# Patient Record
Sex: Male | Born: 1955 | Race: White | Hispanic: No | Smoking: Never smoker
Health system: Southern US, Community
[De-identification: ages and names within clinical notes are randomized; demographics above are authoritative.]

## PROBLEM LIST (undated history)

## (undated) DIAGNOSIS — IMO0002 Reserved for concepts with insufficient information to code with codable children: Secondary | ICD-10-CM

## (undated) DIAGNOSIS — G4730 Sleep apnea, unspecified: Secondary | ICD-10-CM

## (undated) DIAGNOSIS — Z9289 Personal history of other medical treatment: Secondary | ICD-10-CM

## (undated) DIAGNOSIS — E7800 Pure hypercholesterolemia, unspecified: Secondary | ICD-10-CM

## (undated) DIAGNOSIS — I491 Atrial premature depolarization: Secondary | ICD-10-CM

## (undated) DIAGNOSIS — I2510 Atherosclerotic heart disease of native coronary artery without angina pectoris: Secondary | ICD-10-CM

## (undated) DIAGNOSIS — I4819 Other persistent atrial fibrillation: Secondary | ICD-10-CM

## (undated) DIAGNOSIS — Z87442 Personal history of urinary calculi: Secondary | ICD-10-CM

## (undated) DIAGNOSIS — D333 Benign neoplasm of cranial nerves: Secondary | ICD-10-CM

## (undated) DIAGNOSIS — M1990 Unspecified osteoarthritis, unspecified site: Secondary | ICD-10-CM

## (undated) DIAGNOSIS — I10 Essential (primary) hypertension: Secondary | ICD-10-CM

## (undated) DIAGNOSIS — G9600 Cerebrospinal fluid leak, unspecified: Secondary | ICD-10-CM

## (undated) HISTORY — PX: EYE SURGERY: SHX253

## (undated) HISTORY — DX: Unspecified osteoarthritis, unspecified site: M19.90

## (undated) HISTORY — DX: Atrial premature depolarization: I49.1

## (undated) HISTORY — PX: OTHER SURGICAL HISTORY: SHX169

---

## 1999-08-06 ENCOUNTER — Encounter: Payer: Self-pay | Admitting: Specialist

## 1999-08-06 ENCOUNTER — Ambulatory Visit (HOSPITAL_COMMUNITY): Admission: RE | Admit: 1999-08-06 | Discharge: 1999-08-06 | Payer: Self-pay | Admitting: Specialist

## 1999-08-20 ENCOUNTER — Ambulatory Visit (HOSPITAL_COMMUNITY): Admission: RE | Admit: 1999-08-20 | Discharge: 1999-08-20 | Payer: Self-pay | Admitting: Specialist

## 1999-08-20 ENCOUNTER — Encounter: Payer: Self-pay | Admitting: Specialist

## 1999-09-03 ENCOUNTER — Ambulatory Visit (HOSPITAL_COMMUNITY): Admission: RE | Admit: 1999-09-03 | Discharge: 1999-09-03 | Payer: Self-pay | Admitting: Specialist

## 1999-09-03 ENCOUNTER — Encounter: Payer: Self-pay | Admitting: Specialist

## 2007-08-07 ENCOUNTER — Emergency Department (HOSPITAL_COMMUNITY): Admission: EM | Admit: 2007-08-07 | Discharge: 2007-08-07 | Payer: Self-pay | Admitting: Emergency Medicine

## 2007-08-28 ENCOUNTER — Ambulatory Visit (HOSPITAL_BASED_OUTPATIENT_CLINIC_OR_DEPARTMENT_OTHER): Admission: RE | Admit: 2007-08-28 | Discharge: 2007-08-28 | Payer: Self-pay | Admitting: Cardiology

## 2007-08-28 ENCOUNTER — Encounter: Payer: Self-pay | Admitting: Internal Medicine

## 2007-09-04 ENCOUNTER — Ambulatory Visit: Payer: Self-pay | Admitting: Internal Medicine

## 2007-09-15 ENCOUNTER — Ambulatory Visit: Admission: RE | Admit: 2007-09-15 | Discharge: 2007-09-15 | Payer: Self-pay | Admitting: Cardiology

## 2007-09-24 ENCOUNTER — Ambulatory Visit (HOSPITAL_COMMUNITY): Admission: RE | Admit: 2007-09-24 | Discharge: 2007-09-24 | Payer: Self-pay | Admitting: Cardiology

## 2007-09-28 ENCOUNTER — Ambulatory Visit: Admission: RE | Admit: 2007-09-28 | Discharge: 2007-09-28 | Payer: Self-pay | Admitting: Cardiology

## 2007-09-28 HISTORY — PX: NM MYOCAR PERF WALL MOTION: HXRAD629

## 2007-09-30 ENCOUNTER — Encounter: Payer: Self-pay | Admitting: Internal Medicine

## 2007-10-11 ENCOUNTER — Ambulatory Visit: Payer: Self-pay | Admitting: Internal Medicine

## 2007-10-11 DIAGNOSIS — G4733 Obstructive sleep apnea (adult) (pediatric): Secondary | ICD-10-CM | POA: Insufficient documentation

## 2007-10-18 DIAGNOSIS — I10 Essential (primary) hypertension: Secondary | ICD-10-CM | POA: Insufficient documentation

## 2008-05-08 ENCOUNTER — Emergency Department (HOSPITAL_COMMUNITY): Admission: EM | Admit: 2008-05-08 | Discharge: 2008-05-08 | Payer: Self-pay | Admitting: Emergency Medicine

## 2008-07-28 HISTORY — PX: KNEE ARTHROSCOPY: SUR90

## 2008-10-18 ENCOUNTER — Emergency Department (HOSPITAL_COMMUNITY): Admission: EM | Admit: 2008-10-18 | Discharge: 2008-10-18 | Payer: Self-pay | Admitting: Emergency Medicine

## 2008-12-19 ENCOUNTER — Encounter: Admission: RE | Admit: 2008-12-19 | Discharge: 2008-12-19 | Payer: Self-pay | Admitting: Orthopedic Surgery

## 2009-02-14 ENCOUNTER — Encounter: Payer: Self-pay | Admitting: Internal Medicine

## 2009-02-15 ENCOUNTER — Encounter: Admission: RE | Admit: 2009-02-15 | Discharge: 2009-02-15 | Payer: Self-pay | Admitting: Cardiovascular Disease

## 2009-02-20 ENCOUNTER — Ambulatory Visit (HOSPITAL_COMMUNITY): Admission: RE | Admit: 2009-02-20 | Discharge: 2009-02-20 | Payer: Self-pay | Admitting: Cardiovascular Disease

## 2009-02-20 HISTORY — PX: CARDIAC CATHETERIZATION: SHX172

## 2009-02-23 ENCOUNTER — Ambulatory Visit: Payer: Self-pay | Admitting: Internal Medicine

## 2009-02-23 DIAGNOSIS — E785 Hyperlipidemia, unspecified: Secondary | ICD-10-CM | POA: Insufficient documentation

## 2009-02-23 DIAGNOSIS — I519 Heart disease, unspecified: Secondary | ICD-10-CM | POA: Insufficient documentation

## 2010-10-23 ENCOUNTER — Other Ambulatory Visit: Payer: Self-pay | Admitting: Orthopedic Surgery

## 2010-10-23 ENCOUNTER — Ambulatory Visit (HOSPITAL_COMMUNITY)
Admission: RE | Admit: 2010-10-23 | Discharge: 2010-10-23 | Disposition: A | Discharge status: Home / Self Care | Payer: Medicare Other | Source: Ambulatory Visit | Attending: Orthopedic Surgery | Admitting: Orthopedic Surgery

## 2010-10-23 ENCOUNTER — Encounter (HOSPITAL_COMMUNITY): Payer: Medicare Other

## 2010-10-23 ENCOUNTER — Other Ambulatory Visit (HOSPITAL_COMMUNITY): Payer: Self-pay | Admitting: Orthopedic Surgery

## 2010-10-23 DIAGNOSIS — Z01811 Encounter for preprocedural respiratory examination: Secondary | ICD-10-CM

## 2010-10-23 DIAGNOSIS — M779 Enthesopathy, unspecified: Secondary | ICD-10-CM

## 2010-10-23 DIAGNOSIS — Z01812 Encounter for preprocedural laboratory examination: Secondary | ICD-10-CM | POA: Insufficient documentation

## 2010-10-23 LAB — PROTIME-INR: INR: 0.98 (ref 0.00–1.49)

## 2010-10-23 LAB — CBC
Platelets: 232 10*3/uL (ref 150–400)
RBC: 5.35 MIL/uL (ref 4.22–5.81)
WBC: 8.6 10*3/uL (ref 4.0–10.5)

## 2010-10-23 LAB — URINALYSIS, ROUTINE W REFLEX MICROSCOPIC
Nitrite: NEGATIVE
Specific Gravity, Urine: 1.027 (ref 1.005–1.030)
Urobilinogen, UA: 1 mg/dL (ref 0.0–1.0)
pH: 6 (ref 5.0–8.0)

## 2010-10-23 LAB — COMPREHENSIVE METABOLIC PANEL
ALT: 31 U/L (ref 0–53)
Albumin: 3.8 g/dL (ref 3.5–5.2)
Alkaline Phosphatase: 55 U/L (ref 39–117)
Calcium: 9.2 mg/dL (ref 8.4–10.5)
Potassium: 4.1 mEq/L (ref 3.5–5.1)
Sodium: 141 mEq/L (ref 135–145)
Total Protein: 7.2 g/dL (ref 6.0–8.3)

## 2010-10-23 LAB — DIFFERENTIAL
Basophils Absolute: 0 10*3/uL (ref 0.0–0.1)
Basophils Relative: 0 % (ref 0–1)
Eosinophils Absolute: 0.1 10*3/uL (ref 0.0–0.7)
Lymphs Abs: 2.4 10*3/uL (ref 0.7–4.0)
Neutrophils Relative %: 62 % (ref 43–77)

## 2010-10-23 LAB — SURGICAL PCR SCREEN
MRSA, PCR: NEGATIVE
Staphylococcus aureus: NEGATIVE

## 2010-10-27 HISTORY — PX: SHOULDER ARTHROSCOPY: SHX128

## 2010-10-30 ENCOUNTER — Ambulatory Visit (HOSPITAL_COMMUNITY): Payer: Medicare Other

## 2010-10-30 ENCOUNTER — Observation Stay (HOSPITAL_COMMUNITY)
Admission: RE | Admit: 2010-10-30 | Discharge: 2010-11-02 | Disposition: A | Discharge status: Home / Self Care | Payer: Medicare Other | Source: Ambulatory Visit | Attending: Orthopedic Surgery | Admitting: Orthopedic Surgery

## 2010-10-30 DIAGNOSIS — Z9119 Patient's noncompliance with other medical treatment and regimen: Secondary | ICD-10-CM | POA: Insufficient documentation

## 2010-10-30 DIAGNOSIS — M19019 Primary osteoarthritis, unspecified shoulder: Principal | ICD-10-CM | POA: Insufficient documentation

## 2010-10-30 DIAGNOSIS — I10 Essential (primary) hypertension: Secondary | ICD-10-CM | POA: Insufficient documentation

## 2010-10-30 DIAGNOSIS — M25819 Other specified joint disorders, unspecified shoulder: Secondary | ICD-10-CM | POA: Insufficient documentation

## 2010-10-30 DIAGNOSIS — R0789 Other chest pain: Secondary | ICD-10-CM | POA: Insufficient documentation

## 2010-10-30 DIAGNOSIS — Z01812 Encounter for preprocedural laboratory examination: Secondary | ICD-10-CM | POA: Insufficient documentation

## 2010-10-30 DIAGNOSIS — E785 Hyperlipidemia, unspecified: Secondary | ICD-10-CM | POA: Insufficient documentation

## 2010-10-30 DIAGNOSIS — M25519 Pain in unspecified shoulder: Secondary | ICD-10-CM | POA: Insufficient documentation

## 2010-10-30 DIAGNOSIS — G4733 Obstructive sleep apnea (adult) (pediatric): Secondary | ICD-10-CM | POA: Insufficient documentation

## 2010-10-30 DIAGNOSIS — Z01811 Encounter for preprocedural respiratory examination: Secondary | ICD-10-CM | POA: Insufficient documentation

## 2010-10-30 DIAGNOSIS — Z91199 Patient's noncompliance with other medical treatment and regimen due to unspecified reason: Secondary | ICD-10-CM | POA: Insufficient documentation

## 2010-10-30 LAB — BASIC METABOLIC PANEL
BUN: 10 mg/dL (ref 6–23)
Calcium: 8.5 mg/dL (ref 8.4–10.5)
Chloride: 104 mEq/L (ref 96–112)
Creatinine, Ser: 0.94 mg/dL (ref 0.4–1.5)
GFR calc Af Amer: >60 mL/min (ref >60)
GFR calc non Af Amer: >60 mL/min (ref >60)

## 2010-10-30 LAB — CARDIAC PANEL(CRET KIN+CKTOT+MB+TROPI)
Total CK: 117 U/L (ref 7–232)
Troponin I: 0.01 ng/mL (ref 0.00–0.06)

## 2010-10-30 LAB — CBC
HCT: 46.4 % (ref 39.0–52.0)
MCHC: 34.3 g/dL (ref 30.0–36.0)
RDW: 14.4 % (ref 11.5–15.5)
WBC: 13.7 10*3/uL — ABNORMAL HIGH (ref 4.0–10.5)

## 2010-10-30 LAB — PROTIME-INR
INR: 0.99 (ref 0.00–1.49)
Prothrombin Time: 13.3 seconds (ref 11.6–15.2)

## 2010-10-31 LAB — CARDIAC PANEL(CRET KIN+CKTOT+MB+TROPI)
Relative Index: 1.8 (ref 0.0–2.5)
Troponin I: 0.01 ng/mL (ref 0.00–0.06)

## 2010-11-08 NOTE — Op Note (Signed)
  Brandon Marks, Brandon Marks               ACCOUNT NO.:  [id number]  MEDICAL RECORD NO.:  [id number]           PATIENT TYPE:  O  LOCATION:  1612                         FACILITY:  [facility]  PHYSICIAN:  Georges Lynch. Fallan Mccarey, M.D.DATE OF BIRTH:  03/01/1956  DATE OF PROCEDURE:  10/30/2010 DATE OF DISCHARGE:                              OPERATIVE REPORT   SURGEON:  Georges Lynch. Darrelyn Hillock, M.D.  ASSISTANT:  Rozell Searing, [facility].  PREOPERATIVE DIAGNOSES: 1. 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 2. Rule out rotator cuff tendon tear, right shoulder. 3. Severe impingement, right shoulder.  POSTOPERATIVE DIAGNOSES: 1. Severe impingement syndrome, right shoulder. 2. Severe degenerative arthritic changes, acromioclavicular joint,     right shoulder.  PROCEDURE:  Under general anesthesia, the patient was placed on the Sline table.  He was well padded because of his body weight, he weighed 380 pounds.  He was in the semi-sitting position.  He had 2 g of IV Ancef preop.  After sterile prep and drape was carried out, we went through the appropriate time-out.  [name] right arm was marked appropriately in the holding area prior to bringing him back to surgery. An incision was made over the anterior aspect of the right shoulder. Bleeders were identified and cauterized.  Note, this man was extremely large, we had an extremely large deep wound.  We first went down and inserted self-retaining retractors.  The incision was carried down to the acromion.  We first identified acromion, dissected the deltoid tendon from the acromion in the usual fashion.  We split the proximal part of the deltoid muscle.  I then advanced the cerebellar retractors since the wound was so deep and identified the [facility] joint, debrided the soft tissue from the joint.  I then utilized a bur to do a resection of the distal clavicle.  Note, he had an extremely large spur protruding from the  acromion and the acromioclavicular joint.  This was actually embedded down over the cuff.  We gently protected the cuff with a Bennett retractor, did a partial acromionectomy and as I said resected distal clavicle with a bur.  Following that, we removed the subdeltoid bursa.  I inspected the cuff.  Surprisingly, the cuff was intact.  No repair of the cuff was necessary.  Thoroughly irrigated out the area.  I bone waxed the distal end of the clavicle as well as the acromion.  I then irrigated the area and then reapproximated the wound in usual fashion with multiple layers because of his body size.  I then closed the skin with metal staples.  Sterile Neosporin dressing was applied. He was placed in an extra large sling.  The patient left the operating room in satisfactory condition.          ______________________________ Georges Lynch Darrelyn Hillock, M.D.     RAG/MEDQ  D:  10/30/2010  T:  10/31/2010  Job:  [id number]  Electronically Signed by Ranee Gosselin M.D. on 11/08/2010 07:54:03 AM

## 2010-11-30 NOTE — Discharge Summary (Addendum)
  [name], Brandon Marks               ACCOUNT NO.:  [id number]  MEDICAL RECORD NO.:  [id number]           PATIENT TYPE:  O  LOCATION:  1612                         FACILITY:  [facility]  PHYSICIAN:  Georges Lynch. Kaytlen Lightsey, M.D.DATE OF BIRTH:  Nov 07, 1955  DATE OF ADMISSION:  10/30/2010 DATE OF DISCHARGE:  11/02/2010                              DISCHARGE SUMMARY   ADMITTING DIAGNOSES: 1. 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 2. Hypertension. 3. Hypercholesterolemia.  DISCHARGE DIAGNOSES: 1. Severe degenerative arthritis of the right shoulder with     impingement, status post acromionectomy. 2. Hypertension. 3. Hypercholesterolemia.  PROCEDURE:  Mr. Peixoto was admitted to [facility] on October 30, 2010.  He underwent acromionectomy of the right shoulder.  No tear was found.  Also underwent closed manipulation prior to incisions.  Theprocedure was performed under general anesthesia.  Routine orthopedic prep and drape were carried out.  The patient received 2 g of IV Ancef preoperatively.  No complications with the procedure.  The patient was returned to the recovery room in satisfactory condition.  HOSPITAL COURSE:  On October 30, 2010, Mr. Wuertz was admitted to the hospital.  He was admitted at [facility].  He underwent the above stated procedure without complications.  After spending adequte time in the recovery room, he was then taken to the floor.  For further recovery, he was placed on reduced dose of PCA, Dilaudid and muscle relaxants for pain control.  He was also placed in a sling, which he will remain in.  After surgery, the patient started to experience some indigestion associated with GI cocktail.  Postoperative day 2, the patient continued to have quite a bit of pain.  His Dilaudid PCA was discontinued.  The patient was discharged home on November 02, 2010.  DISPOSITION:  To home on November 02, 2010.  DISCHARGE MEDICATIONS: 1. Oxycodone. 2. Aspirin. 3. Fish oil. 4. Multivitamin. 5. __________ 6. Atenolol. 7. Robaxin.  DISCHARGE INSTRUCTIONS:  The patient will increase his activities slowly.  No restrictions to his diet.  He will change his dressing daily and keep the wound clean and dry.  FOLLOWUP:  He is to follow up with Dr. Darrelyn Hillock 2 weeks from the day of surgery.  Contact the office at [phone] to schedule this appointment.  CONDITION ON DISCHARGE:  Improving.     Rozell Searing, PAC   ______________________________ Georges Lynch Darrelyn Hillock, M.D.    LD/MEDQ  D:  11/29/2010  T:  11/29/2010  Job:  [id number]  Electronically Signed by Rozell Searing  on 11/30/2010 07:59:43 AM Electronically Signed by Ranee Gosselin M.D. on 12/06/2010 06:46:58 AM

## 2010-12-10 NOTE — Cardiovascular Report (Signed)
[name], Brandon Marks NO.:  [id number]   MEDICAL RECORD NO.:  [id number]          PATIENT TYPE:  OIB   LOCATION:  2899                         FACILITY:  [facility]   PHYSICIAN:  Nanetta Batty, M.D.   DATE OF BIRTH:  [birth year]   DATE OF PROCEDURE:  DATE OF DISCHARGE:  02/20/2009                            CARDIAC CATHETERIZATION   Brandon Marks is a 55 year old severely overweight Caucasian male with  history of controlled hypertension, dyslipidemia, morbid obesity with  obstructive sleep apnea and dyspnea on exertion with a positive Myoview  several months ago.  Seen by Dr. Jacinto Halim.  He is anticipating elective  bariatric surgery and is undergoing diagnostic coronary arteriography  today via the right radial approach to define his anatomy, rule out  ischemic etiology, and risk stratify him.   DESCRIPTION OF PROCEDURE:  The patient was brought to the second floor  at [facility] in the postabsorptive state.  He was  premedicated with p.o. Valium.  His right wrist was prepped and shaved  in the usual sterile fashion.  A 1% Xylocaine was used for local  anesthesia.  A 5-French Terumo sheath was inserted into the right radial  artery using micropuncture back wall technique.  The patient received  4000 units of heparin intravenously.  Standard vasospastic cocktail  was administered via the sidearm sheath.  A 5-French Tiger catheter and  pigtail catheters were used for selective coronary angiography and left  ventriculography respectively.  Visipaque dye was used for the entirety  of the case.  Retrograde aortic, left ventricular and pullback pressures  were recorded.   HEMODYNAMICS:  1. Aortic systolic pressure 96, diastolic pressure 62.  2. Left ventricular systolic pressure 101, end-diastolic pressure 8.   SELECTIVE CORONARY ANGIOGRAPHY:  1. Left main normal.  2. LAD normal.  There was a small vessel that arose the proximal LAD      and went  anteriorly.  I question whether this was a coronary      artery/coronary artery fistula, although there was slow flow.  3. Circumflex; free of systemic disease.  4. Right coronary; dominant and free of systemic disease.  5. Left ventriculography; RAO left ventriculogram was performed using      25 mL of Visipaque dye at 12 mL per second.  The overall LVEF was      estimated greater than 60% without focal wall motion abnormalities.   IMPRESSION:  Brandon Marks has normal coronaries and normal left  ventricular function.  I believe his Myoview was false positive given  his body habitus and his dyspnea is multifactorial, most likely related  to obesity and obstructive sleep apnea.  Regardless, he is at low risk  from a cardiovascular point of view for bariatric surgery and general  anesthesia.  The right radial sheath was removed and a TR hemostasis  band was placed to achieve hemostasis.  The patient left the lab in  stable condition.  He will be discharged home later today as an  outpatient and will see me back in the office in approximately 1 week in  followup.  Dr. Aida Puffer was notified of these results.      Nanetta Batty, M.D.  Electronically Signed     JB/MEDQ  D:  02/20/2009  T:  02/20/2009  Job:  [id number]   cc:   [facility]  Coralyn Helling, MD  Aida Puffer

## 2010-12-10 NOTE — Procedures (Signed)
NAME:  Brandon Marks, Brandon Marks               ACCOUNT NO.:  [id number]   MEDICAL RECORD NO.:  [id number]          PATIENT TYPE:  OUT   LOCATION:  SLEEP CENTER                 [facility]ACILITY[facility]  [facility]   PHYSICIAN:  Clinton D. Maple Hudson, MD, FCCP, FACPDATE OF BIRTH:  08[birth year]08/53   DATE OF STUDY:  08/28/2007                            NOCTURNAL POLYSOMNOGRAM   REFERRING PHYSICIAN:  Vonna Kotyk R. Jacinto Halim, MD   INDICATION FOR STUDY:  Hypersomnia with sleep apnea.   EPWORTH SLEEPINESS SCORE:  11/24.  BMI 51.5. Weight 390 pounds.  Height  73 inches.  Neck 20 inches.   MEDICATIONS:  No home medication was charted.   SLEEP ARCHITECTURE:  Split study protocol.  During the diagnostic phase,  total sleep time was 129.5 minutes with sleep efficiency 67.1%.  Stage 1  was 6.2%, stage 2 was 93.8%, stages 3  and REM were absent.  Sleep  latency 15.5 minutes.  Awake after sleep onset 43 minutes.  Arousal  index 1.9.  No bedtime medication was taken.   RESPIRATORY DATA:  Split study protocol.  Apnea/hypopnea index (AHI)  32.9 events per hour.  This included one mixed apnea and 70 hypopnea's  before CPAP.  Most events were recorded while sleeping supine.  CPAP was  titrated to 12 CWP for complete control of respiratory events with AHI  0.  The technician took the pressure up to 24 CWP, AHI 0, to completely  stop snoring.  A large Mirage Quattro full face mask was used with  heated humidifier noting that the patient wears a beard, which will  effect mask seal.   OXYGEN DATA:  Moderately loud snoring with oxygen desaturation to a  nadir of 74% on room air with a mean oxygen saturation during the  diagnostic phase of 88%.  CPAP was placed and titrated because of  significant oxygen desaturation together with clinically significant  sleep apnea score.  With CPAP control, oxygen saturation held at a mean  of 91.7% on room air.   CARDIAC DATA:  Sinus rhythm with PAC's.   MOVEMENT-PARASOMNIA:  No significant movement disturbance.   Bathroom x2.   IMPRESSIONS-RECOMMENDATIONS:  1. Moderately severe obstructive sleep apnea/hypopnea syndrome,      apnea/hypopnea index 32.9 per hour with most events recorded while      sleeping supine.  Moderately loud snoring with oxygen desaturation      to a nadir of 74%.  2. Successful CPAP titration with control of respiratory events and      adequate sleep efficiency at a CPAP pressure of 12 CWP,      apnea/hypopnea index 0.  Note that the technician took the pressure      up to 24 CWP to stop all      snoring.  This would be uncomfortable and would require a bilevel      machine.  Recommend a starting home trial pressure of 12 CWP.      Clinton D. Maple Hudson, MD, [facility], FACP  Diplomate, Biomedical engineer of Sleep Medicine  Electronically Signed     CDY/MEDQ  D:  09/04/2007 16:57:23  T:  09/06/2007 10:51:33  Job:  [id number]   cc:  Cristy Hilts. Jacinto Halim, MD  Fax: [phone]

## 2012-02-27 ENCOUNTER — Emergency Department (HOSPITAL_COMMUNITY)
Admission: EM | Admit: 2012-02-27 | Discharge: 2012-02-27 | Disposition: A | Discharge status: Home / Self Care | Payer: Medicare Other | Attending: Emergency Medicine | Admitting: Emergency Medicine

## 2012-02-27 ENCOUNTER — Encounter (HOSPITAL_COMMUNITY): Payer: Self-pay | Admitting: Emergency Medicine

## 2012-02-27 ENCOUNTER — Emergency Department (HOSPITAL_COMMUNITY): Payer: Medicare Other

## 2012-02-27 DIAGNOSIS — G4733 Obstructive sleep apnea (adult) (pediatric): Secondary | ICD-10-CM | POA: Diagnosis present

## 2012-02-27 DIAGNOSIS — E7800 Pure hypercholesterolemia, unspecified: Secondary | ICD-10-CM | POA: Insufficient documentation

## 2012-02-27 DIAGNOSIS — I10 Essential (primary) hypertension: Secondary | ICD-10-CM | POA: Diagnosis present

## 2012-02-27 DIAGNOSIS — IMO0002 Reserved for concepts with insufficient information to code with codable children: Secondary | ICD-10-CM | POA: Insufficient documentation

## 2012-02-27 DIAGNOSIS — R0600 Dyspnea, unspecified: Secondary | ICD-10-CM | POA: Diagnosis present

## 2012-02-27 DIAGNOSIS — E785 Hyperlipidemia, unspecified: Secondary | ICD-10-CM | POA: Diagnosis present

## 2012-02-27 DIAGNOSIS — R079 Chest pain, unspecified: Secondary | ICD-10-CM | POA: Diagnosis present

## 2012-02-27 DIAGNOSIS — G4730 Sleep apnea, unspecified: Secondary | ICD-10-CM | POA: Insufficient documentation

## 2012-02-27 DIAGNOSIS — Z9989 Dependence on other enabling machines and devices: Secondary | ICD-10-CM | POA: Diagnosis present

## 2012-02-27 HISTORY — DX: Essential (primary) hypertension: I10

## 2012-02-27 HISTORY — DX: Reserved for concepts with insufficient information to code with codable children: IMO0002

## 2012-02-27 HISTORY — DX: Pure hypercholesterolemia, unspecified: E78.00

## 2012-02-27 HISTORY — DX: Sleep apnea, unspecified: G47.30

## 2012-02-27 HISTORY — DX: Morbid (severe) obesity due to excess calories: E66.01

## 2012-02-27 LAB — CBC WITH DIFFERENTIAL/PLATELET
Basophils Absolute: 0 10*3/uL (ref 0.0–0.1)
Eosinophils Relative: 1 % (ref 0–5)
HCT: 43.3 % (ref 39.0–52.0)
Hemoglobin: 15.1 g/dL (ref 13.0–17.0)
Lymphocytes Relative: 20 % (ref 12–46)
Lymphs Abs: 2.2 10*3/uL (ref 0.7–4.0)
MCV: 87.1 fL (ref 78.0–100.0)
Monocytes Absolute: 0.7 10*3/uL (ref 0.1–1.0)
Monocytes Relative: 6 % (ref 3–12)
Neutro Abs: 8.2 10*3/uL — ABNORMAL HIGH (ref 1.7–7.7)
RBC: 4.97 MIL/uL (ref 4.22–5.81)
WBC: 11.2 10*3/uL — ABNORMAL HIGH (ref 4.0–10.5)

## 2012-02-27 LAB — COMPREHENSIVE METABOLIC PANEL
AST: 17 U/L (ref 0–37)
CO2: 26 mEq/L (ref 19–32)
Calcium: 8.9 mg/dL (ref 8.4–10.5)
Chloride: 105 mEq/L (ref 96–112)
Creatinine, Ser: 0.9 mg/dL (ref 0.50–1.35)
GFR calc Af Amer: >90 mL/min (ref >90)
GFR calc non Af Amer: >90 mL/min (ref >90)
Glucose, Bld: 127 mg/dL — ABNORMAL HIGH (ref 70–99)
Total Bilirubin: 0.3 mg/dL (ref 0.3–1.2)

## 2012-02-27 LAB — LIPASE, BLOOD: Lipase: 42 U/L (ref 11–59)

## 2012-02-27 MED ORDER — ASPIRIN 325 MG PO TABS
325.0000 mg | ORAL_TABLET | Freq: Once | ORAL | Status: DC
  Filled 2012-02-27: qty 1

## 2012-02-27 MED ORDER — ASPIRIN 81 MG PO CHEW
162.0000 mg | CHEWABLE_TABLET | Freq: Once | ORAL | Status: AC
  Administered 2012-02-27: 162 mg via ORAL

## 2012-02-27 MED ORDER — ASPIRIN 81 MG PO CHEW
CHEWABLE_TABLET | ORAL | Status: AC
  Filled 2012-02-27: qty 2

## 2012-02-27 MED ORDER — OMEPRAZOLE 20 MG PO CPDR: 20.0000 mg | DELAYED_RELEASE_CAPSULE | Freq: Every day | ORAL | Status: DC

## 2012-02-27 NOTE — ED Notes (Addendum)
Pt reports (L) side chest pain "around my heart" radiating straight through to his back last night at 2300, pt describes the pain as "discomfort." Pt reports the pain woke him up from his sleep at 0100 this am. Pt denies sob, N/V, headache, cough, congestion, or abd pain. Pt reports (R) arm tingling/numbness 2 days ago.

## 2012-02-27 NOTE — ED Provider Notes (Signed)
History     CSN: [id number]  Arrival date & time 02/27/12  4098   First MD Initiated Contact with Patient 02/27/12 [phone]      Chief Complaint  Patient presents with  . Chest Pain    (Consider location/radiation/quality/duration/timing/severity/associated sxs/prior treatment) Patient is a 56 y.o. male presenting with chest pain. The history is provided by the patient.  Chest Pain The chest pain began yesterday. Duration of episode(s) is 5 hours. Chest pain occurs constantly. The chest pain is resolved. The pain is associated with eating. The pain is currently at 0/10. The severity of the pain is moderate. The quality of the pain is described as dull, aching and pressure-like. The pain radiates to the left shoulder and mid back. Pertinent negatives for primary symptoms include no fever, no cough, no abdominal pain, no nausea and no vomiting. Risk factors include obesity and male gender.  His family medical history is significant for CAD in family.  Procedure history is positive for stress echo.   The pain started after eating fried chicken yesterday. The pain woke him up from sleep. Initially, just substernal. Now radiating L shoulder and back. No ab pain, No numbness or weakness in legs.   Past Medical History  Diagnosis Date  . Herniated disc   . Hypertension   . High cholesterol   . Sleep apnea     non compliant with c-pap  . Morbid obesity     Past Surgical History  Procedure Date  . Knee arthroscopy 2010    Lt  . Shoulder arthroscopy 4/12    Rt    Family History  Problem Relation Age of Onset  . Hypertension Mother   . Heart failure Mother   . Heart failure Father     History  Substance Use Topics  . Smoking status: Never Smoker   . Smokeless tobacco: Never Used  . Alcohol Use: No      Review of Systems  Constitutional: Negative for fever.  Respiratory: Negative for cough.   Cardiovascular: Positive for chest pain.  Gastrointestinal: Negative for nausea,  vomiting, abdominal pain and abdominal distention.  All other systems reviewed and are negative.    Allergies  Codeine and Oxycodone  Home Medications   Current Outpatient Rx  Name Route Sig Dispense Refill  . LISINOPRIL-HYDROCHLOROTHIAZIDE 20-12.5 MG PO TABS Oral Take 1 tablet by mouth daily.    Marland Kitchen LOVASTATIN 20 MG PO TABS Oral Take 40 mg by mouth at bedtime.      BP 134/79  Pulse 78  Temp 99 F (37.2 C) (Oral)  Resp 21  SpO2 98%  Physical Exam  Constitutional: He is oriented to person, place, and time. He appears well-developed.       Obese, not in distress  HENT:  Head: Normocephalic.  Eyes: Conjunctivae and EOM are normal. Pupils are equal, round, and reactive to light.  Neck: Normal range of motion. Neck supple.  Cardiovascular: Normal rate, regular rhythm, normal heart sounds and intact distal pulses.   Pulmonary/Chest: Effort normal and breath sounds normal.  Abdominal: Soft. Bowel sounds are normal. He exhibits no mass. There is no tenderness.  Musculoskeletal: Normal range of motion.  Neurological: He is alert and oriented to person, place, and time.  Skin: Skin is warm.  Psychiatric: He has a normal mood and affect. His behavior is normal. Judgment and thought content normal.    ED Course  Procedures (including critical care time)  Labs Reviewed  CBC WITH DIFFERENTIAL - Abnormal;  Notable for the following:    WBC 11.2 (*)     Neutro Abs 8.2 (*)     All other components within normal limits  COMPREHENSIVE METABOLIC PANEL - Abnormal; Notable for the following:    Glucose, Bld 127 (*)     All other components within normal limits  LIPASE, BLOOD  POCT I-STAT TROPONIN I   Dg Chest 2 View  02/27/2012  *RADIOLOGY REPORT*  Clinical Data: Chest pain.  CHEST - 2 VIEW  Comparison: 10/30/2010  Findings: Cardiomegaly.  Lungs are clear.  No effusions or edema. No acute bony abnormality.  Degenerative changes in the thoracic spine.  IMPRESSION: Cardiomegaly.  No active  disease.  Original Report Authenticated By: Cyndie Chime, M.D.     No diagnosis found.  Date: 02/27/2012  Rate: 74  Rhythm: atrial fibrillation  QRS Axis: right  Intervals: normal  ST/T Wave abnormalities: TWI anteriorly   Conduction Disutrbances:none  Narrative Interpretation:   Old EKG Reviewed: none available    MDM  56 yo M hx of HTN, HL here with CP. Substernal CP after eating fried food now radiating to L shoulder and back but pain free currently. Given patient had abnormal nuclear stress test 2009 with nl cath, will need to consider cardiology consult. Will check trop, labs, cxr, ekg, likely need imaging.   11:45 AM Labs nl, pain free. Discussed with Dr. Allyson Sabal from interventional cardiology, who will see the patient.  11:45 AM Dr. Hazle Coca partner saw the patient and made him an outpatient appointment. Patient pain free now, safe for d/c.        Richardean Canal, MD 02/27/12 1145

## 2012-02-27 NOTE — ED Notes (Signed)
Patient transported to X-ray 

## 2012-02-27 NOTE — ED Notes (Signed)
MD at bedside. Cardiologist at bedside.  

## 2012-02-27 NOTE — ED Notes (Signed)
Pt c/o left sided CP starting at 0100 this am with SOB; pt denies N/V or diaphoresis; pt sts radiates through to back

## 2012-02-27 NOTE — H&P (Signed)
Patient ID: Brandon Marks MRN: [id number], DOB/AGE: 1955/11/01   Admit date: 02/27/2012   Primary Physician: Aida Puffer, MD Primary Cardiologist: Dr Royann Shivers   HPI: 56 y/o with a history of morbid obesity, sleep apnea, HTN, and dyslipidemia. He has not used his C-pap in more than a year. He had an abnormal Myoview in 2009 but it was felt to be low risk. He ended up getting cathed in July 2010 for chest pain, this revealed normal coronaries and Nl LVF. LOV was Aug 2012. There was some indication he may go for bariatric surgery but this was never done because of cost. He is not working now. Early this am he woke up with vague chest discomfort "tight". No radiation to his arm, neck, or jaw. No associated SOB, nausea, vomiting, or diaphoresis. He took 2 ASA around 4 am and come to the ER. He is pain free now without Rx.    Problem List: Past Medical History  Diagnosis Date  . Herniated disc   . Hypertension   . High cholesterol   . Sleep apnea     non compliant with c-pap  . Morbid obesity     Past Surgical History  Procedure Date  . Knee arthroscopy 2010    Lt  . Shoulder arthroscopy 4/12    Rt     Allergies:  Allergies  Allergen Reactions  . Codeine   . Oxycodone      Home Medications  (Not in a hospital admission)   Family History  Problem Relation Age of Onset  . Hypertension Mother   . Heart failure Mother   . Heart failure Father      History   Social History  . Marital Status: Divorced    Spouse Name: N/A    Number of Children: N/A  . Years of Education: N/A   Occupational History  . Not on file.   Social History Main Topics  . Smoking status: Never Smoker   . Smokeless tobacco: Never Used  . Alcohol Use: No  . Drug Use: No  . Sexually Active:    Other Topics Concern  . Not on file   Social History Narrative  . No narrative on file     Review of Systems: General: negative for chills, fever, night sweats or weight changes.    Cardiovascular: negative for chest pain, chronic dyspnea on exertion, edema, orthopnea, palpitations, paroxysmal nocturnal dyspnea or shortness of breath Dermatological: negative for rash Respiratory: negative for cough or wheezing Urologic: negative for hematuria Abdominal: negative for nausea, vomiting, diarrhea, bright red blood per rectum, melena, or hematemesis Neurologic: negative for visual changes, syncope, or dizziness All other systems reviewed and are otherwise negative except as noted above.  Physical Exam: Blood pressure 134/79, pulse 78, temperature 99 F (37.2 C), temperature source Oral, resp. rate 21, SpO2 98.00%.  General appearance: alert, cooperative, no distress and morbidly obese Neck: no adenopathy, no carotid bruit, no JVD, supple, symmetrical, trachea midline and thyroid not enlarged, symmetric, no tenderness/mass/nodules Lungs: clear to auscultation bilaterally Heart: regular rate and rhythm Abdomen: obese Extremities: trace edma Pulses: 2+ and symmetric Skin: Skin color, texture, turgor normal. No rashes or lesions Neurologic: Grossly normal    Labs:   Results for orders placed during the hospital encounter of 02/27/12 (from the past 24 hour(s))  CBC WITH DIFFERENTIAL     Status: Abnormal   Collection Time   02/27/12  8:11 AM      Component Value Range   WBC  11.2 (*) 4.0 - 10.5 K/uL   RBC 4.97  4.22 - 5.81 MIL/uL   Hemoglobin 15.1  13.0 - 17.0 g/dL   HCT 40.9  81.1 - 91.4 %   MCV 87.1  78.0 - 100.0 fL   MCH 30.4  26.0 - 34.0 pg   MCHC 34.9  30.0 - 36.0 g/dL   RDW 78.2  95.6 - 21.3 %   Platelets 245  150 - 400 K/uL   Neutrophils Relative 73  43 - 77 %   Neutro Abs 8.2 (*) 1.7 - 7.7 K/uL   Lymphocytes Relative 20  12 - 46 %   Lymphs Abs 2.2  0.7 - 4.0 K/uL   Monocytes Relative 6  3 - 12 %   Monocytes Absolute 0.7  0.1 - 1.0 K/uL   Eosinophils Relative 1  0 - 5 %   Eosinophils Absolute 0.1  0.0 - 0.7 K/uL   Basophils Relative 0  0 - 1 %    Basophils Absolute 0.0  0.0 - 0.1 K/uL  COMPREHENSIVE METABOLIC PANEL     Status: Abnormal   Collection Time   02/27/12  8:11 AM      Component Value Range   Sodium 140  135 - 145 mEq/L   Potassium 3.6  3.5 - 5.1 mEq/L   Chloride 105  96 - 112 mEq/L   CO2 26  19 - 32 mEq/L   Glucose, Bld 127 (*) 70 - 99 mg/dL   BUN 13  6 - 23 mg/dL   Creatinine, Ser 0.86  0.50 - 1.35 mg/dL   Calcium 8.9  8.4 - 57.8 mg/dL   Total Protein 7.0  6.0 - 8.3 g/dL   Albumin 3.6  3.5 - 5.2 g/dL   AST 17  0 - 37 U/L   ALT 21  0 - 53 U/L   Alkaline Phosphatase 50  39 - 117 U/L   Total Bilirubin 0.3  0.3 - 1.2 mg/dL   GFR calc non Af Amer >90  >90 mL/min   GFR calc Af Amer >90  >90 mL/min  LIPASE, BLOOD     Status: Normal   Collection Time   02/27/12  8:11 AM      Component Value Range   Lipase 42  11 - 59 U/L  POCT I-STAT TROPONIN I     Status: Normal   Collection Time   02/27/12  8:29 AM      Component Value Range   Troponin i, poc 0.00  0.00 - 0.08 ng/mL   Comment 3              Radiology/Studies: Dg Chest 2 View  02/27/2012  *RADIOLOGY REPORT*  Clinical Data: Chest pain.  CHEST - 2 VIEW  Comparison: 10/30/2010  Findings: Cardiomegaly.  Lungs are clear.  No effusions or edema. No acute bony abnormality.  Degenerative changes in the thoracic spine.  IMPRESSION: Cardiomegaly.  No active disease.  Original Report Authenticated By: Cyndie Chime, M.D.    EKG:NSR, PACs  ASSESSMENT AND PLAN:  Principal Problem:  *Chest pain Active Problems:  OBSTRUCTIVE SLEEP APNEA  Obesity, morbid  Dyspnea, chronic DOE  HYPERLIPIDEMIA  HYPERTENSION  Plan-MD to see, ? OK to discharge on PPI with plans to resume C-Pap.  Deland Pretty, PA-C 02/27/2012, 11:31 AM  I have seen and examined the patient along with Corine Shelter, PA.  I have reviewed the chart, notes and new data.  I agree with NP's note.  Key  new complaints: chest pain has resolved Key examination changes: as much as obesity permits examination, no  abnormalities are found other than PACs Key new findings / data: ECG and enzymes negative for signs of ischemia  PLAN: Body habitus precludes adequate noninvasive imaging studies. If he needs further evaluation for CAD the best option is direct coronary angiography. At this point invasive evaluation appears unnecessary. DC home. Empirical PPI.  Thurmon Fair, MD, [facility] [phone] 02/27/2012, 11:38 AM

## 2012-02-27 NOTE — ED Notes (Signed)
Pt reports taking asa 162 mg this am prior to arrival

## 2012-07-05 ENCOUNTER — Encounter: Payer: Self-pay | Admitting: *Deleted

## 2012-07-05 ENCOUNTER — Encounter: Payer: Medicare Other | Attending: Family Medicine | Admitting: *Deleted

## 2012-07-05 DIAGNOSIS — Z713 Dietary counseling and surveillance: Secondary | ICD-10-CM | POA: Insufficient documentation

## 2012-07-05 NOTE — Progress Notes (Signed)
Medical Nutrition Therapy:  Appt start time: 0915 end time:  09811.  Assessment:  Patient here today for morbid obesity and hypertension. He reports that he has a history of trying to lose weight with short term success. He has lost about 16 pounds over the last month, but has gained some back this past week. He reports that he tries to eat healthy, but always goes back to bad habits. He admits to drinking too much regular soda and frequent evening snacking. BMI 54.6.   MEDICATIONS: Phentermine, lisinopril, lovastatin, Prilosec   DIETARY INTAKE:   Usual eating pattern includes 2 meals and 1 snacks per day.  24-hr recall:  B ( AM): Eggs, whole wheat toast, sausage/bacon, coffee (Splenda and cream)  Snk ( AM): None  L ( PM): apple/orange Snk ( PM): None D ( PM): Salad with grilled chicken, or Cornbread, grilled chicken, green beans/corn Snk ( PM): Oranges, chocolate candy/cookies sometimes Beverages: Water, coffee, soda, unsweetened tea  Usual physical activity: He has gone to the [place] in the past, walking on treadmill, stationary bike, but this is not a regular habit  Estimated energy needs: 1600 calories 200 g carbohydrates 100 g protein 44 g fat  Progress Towards Goal(s):  In progress.   Nutritional Diagnosis:  [place]-3.3 Overweight/obesity As related to excessive energy intake and physical inactivity.  As evidenced by BMI 54.6.    Intervention:  Nutrition counseling. We discussed nutrition for weight loss, including the importance of eating regular meals, planned/healthy snacks, label reading, and portion control.   Goals:  1. 1-2 pounds weight loss per week.  2. Reduce soda intake. Carry water in a 1 liter bottle to drink throughout the day. Aim for at least 2 liters of water/unsweetened drinks.  3. Plan for a healthy evening snack.  4. Monitor portion size and choose healthier options most frequently.   Handouts given during visit include:  Weight loss tips handout  Yellow  portions card  Monitoring/Evaluation:  Dietary intake, exercise, and body weight in 1 month(s).

## 2012-07-05 NOTE — Patient Instructions (Signed)
Goals:  1. 1-2 pounds weight loss per week.  2. Reduce soda intake. Carry water in a 1 liter bottle to drink throughout the day. Aim for at least 2 liters of water/unsweetened drinks.  3. Plan for a healthy evening snack.  4. Monitor portion size and choose healthier options most frequently.

## 2012-08-02 ENCOUNTER — Ambulatory Visit: Payer: Medicare Other | Admitting: *Deleted

## 2012-12-29 ENCOUNTER — Other Ambulatory Visit: Payer: Self-pay | Admitting: Orthopaedic Surgery

## 2012-12-29 DIAGNOSIS — M542 Cervicalgia: Secondary | ICD-10-CM

## 2012-12-29 DIAGNOSIS — M5417 Radiculopathy, lumbosacral region: Secondary | ICD-10-CM

## 2013-01-13 ENCOUNTER — Ambulatory Visit
Admission: RE | Admit: 2013-01-13 | Discharge: 2013-01-13 | Disposition: A | Discharge status: Home / Self Care | Payer: Medicare Other | Source: Ambulatory Visit | Attending: Orthopaedic Surgery | Admitting: Orthopaedic Surgery

## 2013-01-13 ENCOUNTER — Other Ambulatory Visit: Payer: Medicare Other

## 2013-01-13 DIAGNOSIS — M542 Cervicalgia: Secondary | ICD-10-CM

## 2013-01-13 DIAGNOSIS — M5417 Radiculopathy, lumbosacral region: Secondary | ICD-10-CM

## 2013-01-18 ENCOUNTER — Other Ambulatory Visit: Payer: Self-pay | Admitting: *Deleted

## 2013-01-18 MED ORDER — LISINOPRIL-HYDROCHLOROTHIAZIDE 20-12.5 MG PO TABS: 1.0000 | ORAL_TABLET | Freq: Every day | ORAL | Status: DC

## 2013-01-18 NOTE — Telephone Encounter (Signed)
Refill lisinopril HCTZ electronically

## 2013-05-18 ENCOUNTER — Ambulatory Visit: Payer: Medicare Other | Admitting: Cardiovascular Disease

## 2013-06-20 ENCOUNTER — Other Ambulatory Visit: Payer: Self-pay | Admitting: Cardiovascular Disease

## 2013-06-20 NOTE — Telephone Encounter (Signed)
Rx was sent to pharmacy electronically. 

## 2013-07-13 ENCOUNTER — Other Ambulatory Visit: Payer: Self-pay | Admitting: Cardiovascular Disease

## 2013-07-13 ENCOUNTER — Ambulatory Visit: Payer: Medicare Other | Admitting: Cardiovascular Disease

## 2013-07-13 ENCOUNTER — Other Ambulatory Visit: Payer: Self-pay | Admitting: *Deleted

## 2013-07-13 MED ORDER — LOVASTATIN 20 MG PO TABS: 20.0000 mg | ORAL_TABLET | Freq: Every day | ORAL | Status: DC

## 2013-07-13 MED ORDER — LISINOPRIL-HYDROCHLOROTHIAZIDE 20-12.5 MG PO TABS: 1.0000 | ORAL_TABLET | Freq: Every day | ORAL | Status: DC

## 2013-07-13 NOTE — Telephone Encounter (Signed)
Patient had to cancel appt today and rescheduled for mid January 2015.  Prescriptions refilled for #30 only.

## 2013-08-02 ENCOUNTER — Other Ambulatory Visit: Payer: Self-pay | Admitting: Cardiovascular Disease

## 2013-08-02 NOTE — Telephone Encounter (Signed)
Rx was sent to pharmacy electronically. 

## 2013-08-08 ENCOUNTER — Ambulatory Visit: Payer: Medicare Other | Admitting: Cardiovascular Disease

## 2013-08-09 ENCOUNTER — Telehealth: Payer: Self-pay | Admitting: Cardiovascular Disease

## 2013-08-09 NOTE — Telephone Encounter (Signed)
Running out of meds because unable to make appt on 1/12 because had sinus infection  Has appt 2/4  Please call

## 2013-08-09 NOTE — Telephone Encounter (Signed)
Returned call and pt verified x 2.  Pt informed message received and he will need a sooner appt for refills as it has been > 1 year since last visit and he has been given 3 refills w/ instructions to be seen for more.  Pt verbalized understanding and agreed w/ plan.  Appt scheduled for 1.15.15 at 11:30am w/ Dr. Sallyanne Kuster for evaluation and refills.  Appt on 2.3.15 canceled.  Pt agreed to keep appt as he only has 4 pills left.

## 2013-08-09 NOTE — Telephone Encounter (Signed)
Returned call.  Left message to call back before 4pm.  Pt last seen October 2013 and given 3 refills w/ warnings he needs an appt for refills.  Pt canceled appts on 12.17.14 and 1.12.15 on the day of the appointment.  Pt will need to schedule an appointment w/ an Extender to be seen to get refills until appt w/ Dr. Sallyanne Kuster.  Will offer sooner appt w/ an Extender or pt can see PCP for refills until he can be seen.

## 2013-08-11 ENCOUNTER — Other Ambulatory Visit: Payer: Self-pay | Admitting: Cardiovascular Disease

## 2013-08-11 ENCOUNTER — Ambulatory Visit (INDEPENDENT_AMBULATORY_CARE_PROVIDER_SITE_OTHER): Payer: Medicare Other | Admitting: Cardiovascular Disease

## 2013-08-11 ENCOUNTER — Encounter: Payer: Self-pay | Admitting: Cardiovascular Disease

## 2013-08-11 VITALS — BP 134/80 | HR 81 | Resp 20 | Ht 73.0 in | Wt 390.0 lb

## 2013-08-11 DIAGNOSIS — I10 Essential (primary) hypertension: Secondary | ICD-10-CM

## 2013-08-11 DIAGNOSIS — Z79899 Other long term (current) drug therapy: Secondary | ICD-10-CM

## 2013-08-11 DIAGNOSIS — E785 Hyperlipidemia, unspecified: Secondary | ICD-10-CM

## 2013-08-11 MED ORDER — LISINOPRIL-HYDROCHLOROTHIAZIDE 20-12.5 MG PO TABS: 1.0000 | ORAL_TABLET | Freq: Every day | ORAL | Status: DC

## 2013-08-11 MED ORDER — PHENTERMINE HCL 37.5 MG PO CAPS
37.5000 mg | ORAL_CAPSULE | ORAL | Status: DC
Start: 2013-08-11 — End: 2013-10-20

## 2013-08-11 NOTE — Patient Instructions (Addendum)
Your physician recommends that you schedule a follow-up appointment in: [facility] [place] [facility] WORK TO BE DONE

## 2013-08-12 NOTE — Telephone Encounter (Signed)
Rx was sent to pharmacy electronically. 

## 2013-08-15 ENCOUNTER — Encounter: Payer: Self-pay | Admitting: Cardiovascular Disease

## 2013-08-15 ENCOUNTER — Other Ambulatory Visit: Payer: Self-pay | Admitting: Cardiovascular Disease

## 2013-08-15 NOTE — Assessment & Plan Note (Signed)
I would like to encourage any effort on his part to lose weight and therefore gave him a short-term refill on his phentermine until he is able to followup with his physician. We discussed the fact that these medications need to be taken only as part of a comprehensive weight loss effort and are not a silver bullet. He does not have valvular heart disease or pulmonary hypertension.

## 2013-08-15 NOTE — Assessment & Plan Note (Signed)
Problem is his low HDL cholesterol, but this will not improve until he loses substantial weight.

## 2013-08-15 NOTE — Progress Notes (Signed)
Patient ID: Brandon Marks, male   DOB: 04/18/1956, 58 y.o.   MRN: [id number]     Reason for office visit Brandon Marks is a morbidly obese 58 year old man with hypertension, hyperlipidemia, obstructive sleep apnea and diastolic left ventricular dysfunction who is long overdue for followup. The only reason he came in is because he needed prescriptions.  He also wants to restart treatment with phentermine which helped him lose about 15 pounds. When he ran out of this prescription, provided by his primary care physician, he started gaining the weight back. He has a voracious appetite and very poor impulse control. He eats high sodium high caloric foods with little exertional value such as chips and french fries.  He underwent cardiac catheterization in 2010 which did not show any meaningful coronary artery disease. The study was performed for a false positive nuclear stress test when he was anticipating bariatric surgery.  No complaints today.   Allergies  Allergen Reactions  . Codeine   . Oxycodone     Current Outpatient Prescriptions  Medication Sig Dispense Refill  . Multiple Vitamin (MULTIVITAMIN) tablet Take 1 tablet by mouth daily. Off and on      . lisinopril-hydrochlorothiazide (PRINZIDE,ZESTORETIC) 20-12.5 MG per tablet TAKE ONE TABLET BY MOUTH ONCE DAILY  10 tablet  0  . lovastatin (MEVACOR) 20 MG tablet TAKE TWO TABLETS BY MOUTH AT BEDTIME  60 tablet  11  . phentermine 37.5 MG capsule Take 1 capsule (37.5 mg total) by mouth every morning.  30 capsule  1   No current facility-administered medications for this visit.    Past Medical History  Diagnosis Date  . Herniated disc   . Hypertension   . High cholesterol   . Sleep apnea     non compliant with c-pap  . Morbid obesity     Past Surgical History  Procedure Laterality Date  . Knee arthroscopy  2010    Lt  . Shoulder arthroscopy  4/12    Rt    Family History  Problem Relation Age of Onset  . Hypertension Mother   .  Heart failure Mother   . Heart failure Father     History   Social History  . Marital Status: Divorced    Spouse Name: N/A    Number of Children: N/A  . Years of Education: N/A   Occupational History  . Not on file.   Social History Main Topics  . Smoking status: Never Smoker   . Smokeless tobacco: Never Used  . Alcohol Use: No  . Drug Use: No  . Sexual Activity: Not on file   Other Topics Concern  . Not on file   Social History Narrative  . No narrative on file    Review of systems: The patient specifically denies any chest pain at rest or with exertion, dyspnea at rest or with exertion, orthopnea, paroxysmal nocturnal dyspnea, syncope, palpitations, focal neurological deficits, intermittent claudication, lower extremity edema, unexplained weight gain, cough, hemoptysis or wheezing.  The patient also denies abdominal pain, nausea, vomiting, dysphagia, diarrhea, constipation, polyuria, polydipsia, dysuria, hematuria, frequency, urgency, abnormal bleeding or bruising, fever, chills, unexpected weight changes, mood swings, change in skin or hair texture, change in voice quality, auditory or visual problems, allergic reactions or rashes, new musculoskeletal complaints other than usual "aches and pains".   PHYSICAL EXAM BP 134/80  Pulse 81  Resp 20  Ht 6' 1"  (1.854 m)  Wt 176.903 kg (390 lb)  BMI 51.47 kg/m2  General: Alert,  oriented x3,  super morbidly obese Head: no evidence of trauma, PERRL, EOMI, no exophtalmos or lid lag, no myxedema, no xanthelasma; normal ears, nose and oropharynx Neck: normal jugular venous pulsations and no hepatojugular reflux; brisk carotid pulses without delay and no carotid bruits Chest: clear to auscultation, no signs of consolidation by percussion or palpation, normal fremitus, symmetrical and full respiratory excursions Cardiovascular: Able to locate the apical impulse, regular rhythm, normal first and second heart sounds, no murmurs, rubs  or gallops Abdomen: no tenderness or distention, no masses by palpation, no abnormal pulsatility or arterial bruits, normal bowel sounds, no hepatosplenomegaly Extremities: no clubbing, cyanosis or edema; 2+ radial, ulnar and brachial pulses bilaterally; 2+ right femoral, posterior tibial and dorsalis pedis pulses; 2+ left femoral, posterior tibial and dorsalis pedis pulses; no subclavian or femoral bruits Neurological: grossly nonfocal   EKG: Sinus rhythm with a single PVC and a single PAC, low voltage secondary to obesity and nonspecific T wave inversion in leads 1 and aVL similar to the previous tracing  Lipid Panel  December 2013 total cholesterol 157, triglycerides 122, HDL 34, LDL 99 Creatinine 0.95  BMET    Component Value Date/Time   NA 140 02/27/2012 0811   K 3.6 02/27/2012 0811   CL 105 02/27/2012 0811   CO2 26 02/27/2012 0811   GLUCOSE 127* 02/27/2012 0811   BUN 13 02/27/2012 0811   CREATININE 0.90 02/27/2012 0811   CALCIUM 8.9 02/27/2012 0811   GFRNONAA >90 02/27/2012 0811   GFRAA >90 02/27/2012 0811     ASSESSMENT AND PLAN Obesity, morbid I would like to encourage any effort on his part to lose weight and therefore gave him a short-term refill on his phentermine until he is able to followup with his physician. We discussed the fact that these medications need to be taken only as part of a comprehensive weight loss effort and are not a silver bullet. He does not have valvular heart disease or pulmonary hypertension.  HYPERTENSION Good control. I gave him refills on his medications  HYPERLIPIDEMIA Problem is his low HDL cholesterol, but this will not improve until he loses substantial weight.   Patient Instructions  Your physician recommends that you schedule a follow-up appointment in: [facility] [place] [facility] WORK TO BE DONE          Orders Placed This Encounter  Procedures  . Cholesterol, Total  . Comp Met (CMET)  . CBC  . EKG 12-Lead   Meds  ordered this encounter  Medications  . Multiple Vitamin (MULTIVITAMIN) tablet    Sig: Take 1 tablet by mouth daily. Off and on  . DISCONTD: lisinopril-hydrochlorothiazide (PRINZIDE,ZESTORETIC) 20-12.5 MG per tablet    Sig: Take 1 tablet by mouth daily.    Dispense:  10 tablet    Refill:  0  . phentermine 37.5 MG capsule    Sig: Take 1 capsule (37.5 mg total) by mouth every morning.    Dispense:  30 capsule    Refill:  Cameron Ambermarie Honeyman, MD, [facility] [phone] office [phone] pager

## 2013-08-15 NOTE — Assessment & Plan Note (Signed)
Good control. I gave him refills on his medications

## 2013-08-16 ENCOUNTER — Encounter: Payer: Self-pay | Admitting: Cardiovascular Disease

## 2013-08-26 ENCOUNTER — Other Ambulatory Visit: Payer: Self-pay | Admitting: Cardiovascular Disease

## 2013-08-29 NOTE — Telephone Encounter (Signed)
Rx was sent to pharmacy electronically. 

## 2013-08-30 ENCOUNTER — Ambulatory Visit: Payer: Medicare Other | Admitting: Cardiovascular Disease

## 2013-08-30 LAB — CBC
HCT: 47.9 % (ref 39.0–52.0)
HEMOGLOBIN: 16.9 g/dL (ref 13.0–17.0)
MCH: 30.5 pg (ref 26.0–34.0)
MCHC: 35.3 g/dL (ref 30.0–36.0)
MCV: 86.3 fL (ref 78.0–100.0)
Platelets: 295 10*3/uL (ref 150–400)
RBC: 5.55 MIL/uL (ref 4.22–5.81)
RDW: 14.6 % (ref 11.5–15.5)
WBC: 7.7 10*3/uL (ref 4.0–10.5)

## 2013-08-30 LAB — COMPREHENSIVE METABOLIC PANEL
ALBUMIN: 4.4 g/dL (ref 3.5–5.2)
ALK PHOS: 50 U/L (ref 39–117)
ALT: 24 U/L (ref 0–53)
AST: 16 U/L (ref 0–37)
BUN: 14 mg/dL (ref 6–23)
CO2: 29 mEq/L (ref 19–32)
Calcium: 9.4 mg/dL (ref 8.4–10.5)
Chloride: 102 mEq/L (ref 96–112)
Creat: 0.85 mg/dL (ref 0.50–1.35)
Glucose, Bld: 90 mg/dL (ref 70–99)
POTASSIUM: 4.7 meq/L (ref 3.5–5.3)
SODIUM: 139 meq/L (ref 135–145)
TOTAL PROTEIN: 7.2 g/dL (ref 6.0–8.3)
Total Bilirubin: 0.4 mg/dL (ref 0.2–1.2)

## 2013-08-30 LAB — CHOLESTEROL, TOTAL: CHOLESTEROL: 132 mg/dL (ref 0–200)

## 2013-10-20 ENCOUNTER — Other Ambulatory Visit: Payer: Self-pay | Admitting: Cardiovascular Disease

## 2013-10-20 NOTE — Telephone Encounter (Signed)
Prescription denied. Deferred to PCP.

## 2013-10-20 NOTE — Telephone Encounter (Deleted)
Rx was sent to pharmacy electronically. 

## 2013-10-20 NOTE — Addendum Note (Signed)
Addended by: Diana Eves on: 10/20/2013 04:40 PM   Modules accepted: Orders

## 2013-10-25 ENCOUNTER — Other Ambulatory Visit: Payer: Self-pay | Admitting: *Deleted

## 2013-10-25 NOTE — Telephone Encounter (Signed)
Refill for phentermine refused - defer to PCP  Refill refusal also faxed to pharmacy

## 2014-03-25 ENCOUNTER — Other Ambulatory Visit: Payer: Self-pay | Admitting: Cardiovascular Disease

## 2014-08-15 ENCOUNTER — Encounter: Payer: Self-pay | Admitting: *Deleted

## 2014-08-25 ENCOUNTER — Ambulatory Visit: Payer: Medicare Other | Admitting: Cardiovascular Disease

## 2014-08-28 ENCOUNTER — Other Ambulatory Visit: Payer: Self-pay | Admitting: Cardiovascular Disease

## 2014-08-28 NOTE — Telephone Encounter (Signed)
Rx refill sent to patient pharmacy   

## 2014-09-08 ENCOUNTER — Other Ambulatory Visit: Payer: Self-pay | Admitting: Cardiovascular Disease

## 2014-09-08 NOTE — Telephone Encounter (Signed)
Rx(s) sent to pharmacy electronically. OV 10/02/14

## 2014-10-02 ENCOUNTER — Ambulatory Visit: Payer: Medicare Other | Admitting: Cardiovascular Disease

## 2014-10-10 ENCOUNTER — Other Ambulatory Visit: Payer: Self-pay | Admitting: Cardiovascular Disease

## 2014-10-11 ENCOUNTER — Telehealth: Payer: Self-pay | Admitting: Cardiovascular Disease

## 2014-10-11 ENCOUNTER — Other Ambulatory Visit: Payer: Self-pay | Admitting: *Deleted

## 2014-10-11 NOTE — Telephone Encounter (Signed)
[place] back and clarified Dr. Victorino December instr.

## 2014-10-11 NOTE — Telephone Encounter (Signed)
[facility] called to notify that pt has Rx for Losartan written by a Dr. Meda Coffee. This was not on our records, I reconciled this to our system. They wanted to clarify whether patient should have the Losartan or the Lisinopril-HCTZ filled.  Routing to Dr. Sallyanne Kuster for clarification.

## 2014-10-11 NOTE — Telephone Encounter (Signed)
Rx(s) sent to pharmacy electronically.  

## 2014-10-11 NOTE — Telephone Encounter (Signed)
Please leave on Lisinopril HCTZ and we will clarify at his appt end of month. Do not fill losartan

## 2014-10-25 ENCOUNTER — Ambulatory Visit: Payer: Medicare Other | Admitting: Cardiovascular Disease

## 2014-10-30 ENCOUNTER — Other Ambulatory Visit: Payer: Self-pay

## 2014-10-30 MED ORDER — LISINOPRIL-HYDROCHLOROTHIAZIDE 20-12.5 MG PO TABS: 1.0000 | ORAL_TABLET | Freq: Every day | ORAL | Status: DC

## 2014-10-30 MED ORDER — LOVASTATIN 20 MG PO TABS: 40.0000 mg | ORAL_TABLET | Freq: Every day | ORAL | Status: DC

## 2014-10-30 NOTE — Telephone Encounter (Signed)
Rx(s) sent to pharmacy electronically.  

## 2014-12-10 ENCOUNTER — Other Ambulatory Visit: Payer: Self-pay | Admitting: Cardiovascular Disease

## 2014-12-12 ENCOUNTER — Telehealth: Payer: Self-pay | Admitting: Cardiovascular Disease

## 2014-12-13 NOTE — Telephone Encounter (Signed)
Closed encounter °

## 2014-12-24 ENCOUNTER — Other Ambulatory Visit: Payer: Self-pay | Admitting: Cardiovascular Disease

## 2014-12-31 ENCOUNTER — Other Ambulatory Visit: Payer: Self-pay | Admitting: Cardiovascular Disease

## 2015-01-01 NOTE — Telephone Encounter (Signed)
Rx has been sent to the pharmacy electronically. ° °

## 2015-01-14 ENCOUNTER — Other Ambulatory Visit: Payer: Self-pay | Admitting: Cardiovascular Disease

## 2015-01-15 ENCOUNTER — Encounter: Payer: Self-pay | Admitting: *Deleted

## 2015-01-15 ENCOUNTER — Telehealth: Payer: Self-pay | Admitting: Cardiovascular Disease

## 2015-01-15 NOTE — Telephone Encounter (Signed)
Rx(s) sent to pharmacy electronically. Staff message sent to Dr. Lurline Del scheduler to contact patient for appointment

## 2015-01-15 NOTE — Telephone Encounter (Signed)
Closed encounter °

## 2015-01-15 NOTE — Telephone Encounter (Signed)
This encounter was created in error - please disregard.

## 2015-01-18 ENCOUNTER — Ambulatory Visit (INDEPENDENT_AMBULATORY_CARE_PROVIDER_SITE_OTHER): Payer: Medicare Other | Admitting: Cardiovascular Disease

## 2015-01-18 ENCOUNTER — Encounter: Payer: Self-pay | Admitting: Cardiovascular Disease

## 2015-01-18 VITALS — BP 130/98 | HR 88 | Ht 74.0 in | Wt >= 6400 oz

## 2015-01-18 DIAGNOSIS — I519 Heart disease, unspecified: Secondary | ICD-10-CM | POA: Diagnosis not present

## 2015-01-18 DIAGNOSIS — G4733 Obstructive sleep apnea (adult) (pediatric): Secondary | ICD-10-CM

## 2015-01-18 DIAGNOSIS — E7800 Pure hypercholesterolemia, unspecified: Secondary | ICD-10-CM

## 2015-01-18 DIAGNOSIS — I10 Essential (primary) hypertension: Secondary | ICD-10-CM | POA: Diagnosis not present

## 2015-01-18 DIAGNOSIS — E669 Obesity, unspecified: Secondary | ICD-10-CM

## 2015-01-18 MED ORDER — LOVASTATIN 40 MG PO TABS: 40.0000 mg | ORAL_TABLET | Freq: Every day | ORAL | Status: DC

## 2015-01-18 MED ORDER — LISINOPRIL-HYDROCHLOROTHIAZIDE 20-25 MG PO TABS: 1.0000 | ORAL_TABLET | Freq: Every day | ORAL | Status: DC

## 2015-01-18 NOTE — Progress Notes (Signed)
Patient ID: Brandon Marks, male   DOB: 08-Mar-1956, 59 y.o.   MRN: [id number]     Cardiology Office Note   Date:  01/20/2015   ID:  VEDDER BRITTIAN, DOB 02-20-1956, MRN [id number]  PCP:  Tamsen Roers, MD  Cardiologist:   Sanda Klein, MD   Chief Complaint  Patient presents with  . Annual Exam    no chest discomfort, bilateral swelling of legs, no pain.would like for you to do all his heart meds instead of his pcp      History of Present Illness: Brandon Marks is a 59 y.o. male who presents for  Hypertensive heart disease with diastolic dysfunction and hyperlipidemia in the setting of super obesity and obstructive sleep apnea. It has been  About a year and half since he was last seen in our clinic and there have been some confusing changes in his medications. He has not lost any further weight despite treatment with phentermine and in fact has gained about 10 pounds and is now over 400 pounds in weight with a BMI of around 52.   he denies angina pectoris. He had normal coronary arteries by angiography in 2010 (false positive nuclear stress test in anticipation of bariatric surgery. He has chronic functional class II exertional dyspnea and chronic mild-moderate edema of the lower extremities. He continues to eat an unhealthy diet.  His medication list includes losartan 100 mg and lisinopril/hydrochlorothiazide 20/12.5 mg daily.  Last year his blood pressure seems to be well controlled just on the ACE inhibitor diuretic combo. He is still taking phentermine.  He asks my opinion about the benefits of diet suppressants.  Past Medical History  Diagnosis Date  . Herniated disc   . Hypertension   . High cholesterol   . Sleep apnea     non compliant with c-pap  . Morbid obesity   . PAC (premature atrial contraction)     Past Surgical History  Procedure Laterality Date  . Knee arthroscopy  2010    Lt  . Shoulder arthroscopy  4/12    Rt  . Cardiac catheterization  02/20/2009   normal coronary arteries  . Nm myocar perf wall motion  09/28/2007    small area of reversibility in the anterolateral wall at the apex concerning for ischemia     Current Outpatient Prescriptions  Medication Sig Dispense Refill  . lovastatin (MEVACOR) 40 MG tablet Take 1 tablet (40 mg total) by mouth at bedtime. 90 tablet 3  . Multiple Vitamin (MULTIVITAMIN) tablet Take 1 tablet by mouth daily. Off and on    . phentermine 37.5 MG capsule TAKE ONE CAPSULE BY MOUTH ONCE DAILY IN THE MORNING 30 capsule 5  . lisinopril-hydrochlorothiazide (PRINZIDE,ZESTORETIC) 20-25 MG per tablet Take 1 tablet by mouth daily. 90 tablet 3   No current facility-administered medications for this visit.    Allergies:   Codeine and Oxycodone    Social History:  The patient  reports that he has never smoked. He has never used smokeless tobacco. He reports that he does not drink alcohol or use illicit drugs.   Family History:  [name] family history includes Alzheimer's disease in his maternal grandfather and maternal grandmother; Heart failure in his father and mother; Hypertension in his mother.    ROS:  Please see the history of present illness.    Otherwise, review of systems positive for none.   All other systems are reviewed and negative.    PHYSICAL EXAM: VS:  BP 130/98 mmHg  Pulse 88  Ht 6\' 2"  (1.88 m)  Wt 404 lb 9.6 oz (183.525 kg)  BMI 51.93 kg/m2 , BMI Body mass index is 51.93 kg/(m^2).  General: Alert, oriented x3, no distress,  His obesity limits physical exam fairly drastically Head: no evidence of trauma, PERRL, EOMI, no exophtalmos or lid lag, no myxedema, no xanthelasma; normal ears, nose and oropharynx Neck:  Unable to evaluate the jugular venous pulsations or hepatojugular reflux; brisk carotid pulses without delay and no carotid bruits Chest: clear to auscultation, no signs of consolidation by percussion or palpation, normal fremitus, symmetrical and full respiratory  excursions Cardiovascular:  Unable to identify the apical impulse, regular rhythm, normal first and second heart sounds, no murmurs, rubs or gallops Abdomen: no tenderness or distention, no masses by palpation, no abnormal pulsatility or arterial bruits, normal bowel sounds, no hepatosplenomegaly Extremities: no clubbing, cyanosis;  1+ pedal and pretibial symmetrical edema; 2+ radial, ulnar and brachial pulses bilaterally; 2+ right femoral, posterior tibial and dorsalis pedis pulses; 2+ left femoral, posterior tibial and dorsalis pedis pulses; no subclavian or femoral bruits Neurological: grossly nonfocal Psych: euthymic mood, full affect   EKG:  EKG is ordered today. The ekg ordered today demonstrates NSR, one PAC, mildly prolonged QTc 476 ms.   Recent Labs: No results found for requested labs within last 365 days.    Lipid Panel    Component Value Date/Time   CHOL 132 08/30/2013 1104      Wt Readings from Last 3 Encounters:  01/18/15 404 lb 9.6 oz (183.525 kg)  08/11/13 390 lb (176.903 kg)  07/05/12 390 lb 11.2 oz (177.22 kg)      ASSESSMENT AND PLAN:  1.   Hypertensive heart disease with diastolic dysfunction  And mild signs of congestive heart failure. He has predominantly right heart failure findings which are more likely related to obesity and obstructive sleep apnea with cor pulmonale rather than left heart failure.  Reinforced the need for sodium restriction, with which is clearly not compliant  2.  Hypertension is not well controlled. I'm not sure the combination of ACE inhibitor and angiotensin receptor blocker is the best choice for him. Will increase the dose of diuretic and reevaluate.  If his diastolic blood pressure remains elevated consider adding amlodipine  3.  Superobesity - underlies all his other medical problems. He has not had much success with anorexia and drugs and we reviewed the fact that these can have side effects such as pulmonary hypertension and  valvular heart disease. My personal advice would be to discontinue this medication and revisit the option for bariatric surgery  4.  Hypercholesterolemia -  He has been out of his statin for months and gave him the prescription. Plan on checking a lipid profile  In a few months.   Current medicines are reviewed at length with the patient today.  The patient has concerns regarding medicines.  The following changes have been made:   Change lisinopril hydrochlorothiazide to the 20-25 mg dose.  Stop losartan  Labs/ tests ordered today include:  Orders Placed This Encounter  Procedures  . EKG 12-Lead    Patient Instructions  Medication Instructions:   STOP LOSARTAN  RESTART LOVASTATIN  NEW RX FOR LISINOPRIL HCTZ HAS BEEN SENT TO YOUR PHARMACY FOR A HIGHER DOSE 20/25MG   Labwork:  NONE  Testing/Procedures:  NONE  Follow-Up:  ONE YEAR  Any Other Special Instructions Will Be Listed Below (If Applicable).      Mikael Spray, MD  01/20/2015  8:48 AM    Sanda Klein, MD, [facility] [phone] office [phone] pager

## 2015-01-18 NOTE — Patient Instructions (Signed)
Medication Instructions:   STOP LOSARTAN  RESTART LOVASTATIN  NEW RX FOR LISINOPRIL HCTZ HAS BEEN SENT TO YOUR PHARMACY FOR A HIGHER DOSE 20/25MG   Labwork:  NONE  Testing/Procedures:  NONE  Follow-Up:  ONE YEAR  Any Other Special Instructions Will Be Listed Below (If Applicable).

## 2015-01-20 DIAGNOSIS — E7800 Pure hypercholesterolemia, unspecified: Secondary | ICD-10-CM | POA: Insufficient documentation

## 2015-01-20 DIAGNOSIS — E669 Obesity, unspecified: Secondary | ICD-10-CM | POA: Insufficient documentation

## 2015-01-22 ENCOUNTER — Telehealth: Payer: Self-pay | Admitting: *Deleted

## 2015-01-22 DIAGNOSIS — E785 Hyperlipidemia, unspecified: Secondary | ICD-10-CM

## 2015-01-22 DIAGNOSIS — Z79899 Other long term (current) drug therapy: Secondary | ICD-10-CM

## 2015-01-22 NOTE — Telephone Encounter (Signed)
-----   Message from Sanda Klein, MD sent at 01/20/2015  8:58 AM EDT -----  Please remind him he needs a complete metabolic panel and lipid profile in about 3 months; okay if PCP does it, but please send [place] a copy

## 2015-01-22 NOTE — Telephone Encounter (Signed)
Lab order placed and mailed to patient to have done fasting late September early October at [place] or his PCP's office.  LM for patient to expect order in the mail.

## 2015-03-01 ENCOUNTER — Encounter: Payer: Self-pay | Admitting: Physician Assistant

## 2015-03-01 ENCOUNTER — Ambulatory Visit (INDEPENDENT_AMBULATORY_CARE_PROVIDER_SITE_OTHER): Payer: Self-pay | Admitting: Physician Assistant

## 2015-03-01 ENCOUNTER — Ambulatory Visit (INDEPENDENT_AMBULATORY_CARE_PROVIDER_SITE_OTHER): Payer: Medicare Other | Admitting: Physician Assistant

## 2015-03-01 VITALS — BP 132/76 | HR 90 | Temp 98.9°F | Resp 16 | Ht 73.0 in | Wt 397.0 lb

## 2015-03-01 DIAGNOSIS — H6123 Impacted cerumen, bilateral: Secondary | ICD-10-CM

## 2015-03-01 DIAGNOSIS — Z024 Encounter for examination for driving license: Secondary | ICD-10-CM

## 2015-03-01 DIAGNOSIS — I10 Essential (primary) hypertension: Secondary | ICD-10-CM

## 2015-03-01 DIAGNOSIS — Z021 Encounter for pre-employment examination: Secondary | ICD-10-CM

## 2015-03-01 DIAGNOSIS — G4733 Obstructive sleep apnea (adult) (pediatric): Secondary | ICD-10-CM

## 2015-03-01 NOTE — Progress Notes (Signed)
   Subjective:    Patient ID: Brandon Marks, male    DOB: 08/28/1955, 59 y.o.   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 PMH of HTN, dyslipidemia, and OSA. Compliant with prinzide for HTN. Recently changed to prinzide by cardiologist. Wears CPAP sometimes and does not have readings from machine. Has not been evaluated by PCP for condition in over 1 year. Denies h/o insulin use, seizures, movement d/o, kidney failure, or DM. Denies HA/dizzines, change in vision, SOB, CP, palpitations, or daytime sleepiness. Does not smoke or use illicit drugs. Med allergies: codeine and oxycodone.   Review of Systems  Constitutional: Negative for fever and fatigue.  HENT: Positive for tinnitus (baseline).   Eyes: Negative for photophobia and visual disturbance.  Respiratory: Negative for cough, shortness of breath and wheezing.   Cardiovascular: Positive for leg swelling (bilateral; baseline). Negative for chest pain and palpitations.  Gastrointestinal: Negative for nausea and vomiting.  Genitourinary: Negative for hematuria.  Musculoskeletal: Positive for gait problem (baseline). Negative for back pain, joint swelling and arthralgias.  Neurological: Negative for dizziness, weakness, numbness and headaches.  Psychiatric/Behavioral: Negative for behavioral problems, sleep disturbance, dysphoric mood and decreased concentration. The patient is not nervous/anxious.        Objective:   Physical Exam  Constitutional: He is oriented to person, place, and time. He appears well-developed and well-nourished. No distress.  Blood pressure 132/76, pulse 90, temperature 98.9 F (37.2 C), temperature source Oral, resp. rate 16, height 6\' 1"  (1.854 m), weight 397 lb (180.078 kg), SpO2 96 %.  HENT:  Head: Normocephalic and atraumatic.  Right Ear: External ear normal.  Left Ear: External ear normal.  Eyes: Conjunctivae are normal. Right eye exhibits no discharge. Left eye  exhibits no discharge. No scleral icterus.  Neck: Normal range of motion. Neck supple. No JVD present. Carotid bruit is not present.  Cardiovascular: Normal rate, regular rhythm and intact distal pulses.  Exam reveals no gallop and no friction rub.   No murmur heard. Pulmonary/Chest: Effort normal and breath sounds normal. No respiratory distress. He has no wheezes. He has no rales. He exhibits no tenderness.  Abdominal: Soft. Bowel sounds are normal. He exhibits no distension. There is no tenderness. There is no rebound and no guarding.  Musculoskeletal: Normal range of motion. He exhibits edema (1+ pitted). He exhibits no tenderness.  Lymphadenopathy:    He has no cervical adenopathy.  Neurological: He is alert and oriented to person, place, and time. He has normal reflexes. No cranial nerve deficit. He exhibits normal muscle tone. Coordination normal.  Skin: Skin is warm and dry. No rash noted. He is not diaphoretic. No erythema. No pallor.  Psychiatric: He has a normal mood and affect. His behavior is normal. Judgment and thought content normal.       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 OSA (obstructive sleep apnea) 3. Essential hypertension 3 month card given. Needs to bring in 30 day compliance report for CPAP in order to get 1 year card. Needs PCP f/u.   Alveta Heimlich PA-C  [facility] 03/01/2015 2:02 PM

## 2015-03-01 NOTE — Progress Notes (Deleted)
Subjective:     Patient ID: Brandon Marks, male   DOB: 08-08-1955, 59 y.o.   MRN: [id number]  HPI   Review of Systems     Objective:   Physical Exam     Assessment:     ***    Plan:     ***

## 2015-03-01 NOTE — Progress Notes (Signed)
   Subjective:    Patient ID: Brandon Marks, male    DOB: 11/19/1955, 59 y.o.   MRN: [id number]  HPI Patient presents to have ears cleaned out. Endorses decreased hearing in left ear for past week. Has baseline tinnitus. Denies otalgia, ear drainage, congestion, sinus pressure, rhinorrhea, N/V, fever, or HA/dizziness. Does not use Qtips or any wax softening agents. Has had ears irrigated before.     Review of Systems As noted above.     Objective:   Physical Exam  Constitutional: He is oriented to person, place, and time. He appears well-developed and well-nourished. No distress.  Blood pressure 132/76, pulse 90, temperature 98.9 F (37.2 C), temperature source Oral, resp. rate 16, height 6\' 1"  (1.854 m), weight 397 lb (180.078 kg), SpO2 96 %.   HENT:  Head: Normocephalic and atraumatic.  Right Ear: External ear normal. No drainage, swelling or tenderness. A foreign body (cerumen impaction) is present.  Left Ear: External ear normal. No drainage, swelling or tenderness. A foreign body (cerumen impaction) is present.  Nose: No rhinorrhea (with erythema). Right sinus exhibits no maxillary sinus tenderness and no frontal sinus tenderness. Left sinus exhibits no maxillary sinus tenderness and no frontal sinus tenderness.  Mouth/Throat: Uvula is midline, oropharynx is clear and moist and mucous membranes are normal. No oropharyngeal exudate or posterior oropharyngeal edema.  Eyes: Conjunctivae are normal. Pupils are equal, round, and reactive to light. Right eye exhibits no discharge. Left eye exhibits no discharge. No scleral icterus.  Neck: Normal range of motion. Neck supple. No thyromegaly present.  Cardiovascular: Normal rate, regular rhythm and normal heart sounds.  Exam reveals no gallop and no friction rub.   No murmur heard. Pulmonary/Chest: Effort normal and breath sounds normal. No respiratory distress. He has no decreased breath sounds. He has no wheezes. He has no rhonchi. He has  no rales.  Lymphadenopathy:    He has no cervical adenopathy.  Neurological: He is alert and oriented to person, place, and time.  Skin: Skin is warm and dry. No rash noted. He is not diaphoretic. No erythema.      Assessment & Plan:  1. Cerumen impaction, bilateral Resolved. Ears irrigated.    Alveta Heimlich PA-C  [facility] 03/01/2015 4:52 PM

## 2015-05-29 ENCOUNTER — Telehealth: Payer: Self-pay | Admitting: *Deleted

## 2015-05-29 NOTE — Telephone Encounter (Signed)
Pt called to ask what he needs to get his 1 year card.  Advised him that he needs to CPAP 30 day print out.  Pt will be in today.

## 2015-05-31 NOTE — Telephone Encounter (Signed)
Pt called. Wanted to know if we received a fax on his CPAP printout. Nicola Girt said she didn't see it, it wasn't in her box, and I did not see it in the fax pile. 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 Thanks

## 2015-05-31 NOTE — Telephone Encounter (Signed)
Did not receive CPAP results from [facility] before [place] left today.  She will be back on Monday.  Pt came in on 11/1 was upset because he got ticket and possibly did not have his DOT on him.  He damand a DOT card and was very rude to front staff.  I had spoke with him a few hours before he came in and told him what we had needed from him, which was CPAP 30 day printout sheet.  I advised him on the phone before he came in where and what he needed to take to get the print out.  He stated that he went to [facility] to drop it off, but could not wait for the print out.  I called [facility] when he was here in the office and they said that they have to have time to print those sheets out and they do this usually by appt.  The guy at [facility] told that he spoke with someone at the [facility] location and they will have it done at [facility] on 05/30/15 and will fax it to [place]. Pt called today and spoke with Junie Panning and she told him that we did not have it yet.  I called [facility] and spoke with [place] around 430pm to see if they could send this ASAP.  Checked around 551 and still no fax.  Tried calling pt but no answer.  DOT card expires tomorrow.   Pt may come in upset about this.  He was told what he needed at the [facility] on 03/01/15 and knew about it all this time because when he called on 05/29/15 he asked what was it that I needed to renew my DOT

## 2015-06-01 ENCOUNTER — Telehealth: Payer: Self-pay

## 2015-06-01 NOTE — Telephone Encounter (Signed)
Faxed received this morning from [facility]. Will place in Tishira's box. See phone message from 05/29/15.

## 2015-06-04 NOTE — Telephone Encounter (Signed)
Reviewed CPAP report. Reports 0% compliance and patient states that he wears every night. Advise that has to show at least 70% 4 hour usage. Will need to do so and then return to clinic with update. If feels that equipment is faulty should contact manufacturer or prescribing provider. Patient states that he will just go somewhere else to get physical. Advised that still would need to give that provider a CPAP report and not doing so is illegal and that he would not be in compliance. He states that "He will just be like Hilary". Reiterated what was required of him.

## 2015-10-25 ENCOUNTER — Ambulatory Visit (INDEPENDENT_AMBULATORY_CARE_PROVIDER_SITE_OTHER): Payer: Medicare Other | Admitting: Neurology

## 2015-10-25 ENCOUNTER — Encounter: Payer: Self-pay | Admitting: Neurology

## 2015-10-25 VITALS — BP 141/82 | HR 76 | Resp 22 | Ht 72.0 in | Wt 394.0 lb

## 2015-10-25 DIAGNOSIS — M7989 Other specified soft tissue disorders: Secondary | ICD-10-CM

## 2015-10-25 DIAGNOSIS — G4719 Other hypersomnia: Secondary | ICD-10-CM | POA: Diagnosis not present

## 2015-10-25 DIAGNOSIS — G4733 Obstructive sleep apnea (adult) (pediatric): Secondary | ICD-10-CM

## 2015-10-25 DIAGNOSIS — R351 Nocturia: Secondary | ICD-10-CM

## 2015-10-25 NOTE — Patient Instructions (Signed)
Based on your symptoms and your exam I believe you still have significant obstructive sleep apnea or OSA, and I think we should proceed with a sleep study to determine how severe it is. If you have more than mild OSA, I want you to consider treatment with CPAP. Please remember, the risks and ramifications of moderate to severe obstructive sleep apnea or OSA are: Cardiovascular disease, including congestive heart failure, stroke, difficult to control hypertension, arrhythmias, and even type 2 diabetes has been linked to untreated OSA. Sleep apnea causes disruption of sleep and sleep deprivation in most cases, which, in turn, can cause recurrent headaches, problems with memory, mood, concentration, focus, and vigilance. Most people with untreated sleep apnea report excessive daytime sleepiness, which can affect their ability to drive. Please do not drive if you feel sleepy.   I will likely see you back after your sleep study to go over the test results and where to go from there. We will call you after your sleep study to advise about the results (most likely, you will hear from Beverlee Nims, my nurse) and to set up an appointment at the time, as necessary.    Our sleep lab administrative assistant, Arrie Aran will meet with you or call you to schedule your sleep study. If you don't hear back from her by next week please feel free to call her at [phone]. This is her direct line and please leave a message with your phone number to call back if you get the voicemail box. She will call back as soon as possible.

## 2015-10-25 NOTE — Progress Notes (Signed)
Subjective:    Patient ID: Brandon Marks is a 60 y.o. male.  HPI     Star Age, MD, PhD [facility] [address] [place], [place] [address]  Dear Dr. Rex Kras,   I saw your patient, Brandon Marks, upon your kind request in my neurologic clinic today for initial consultation of his sleep disorder, in particular, evaluation of his prior diagnosis of OSA. The patient is unaccompanied today. As you know, [name] is a 60 year old right-handed gentleman with an underlying medical history of hypertension, hyperlipidemia, Neck pain, back pain, arthritis and morbid obesity, who was previously diagnosed with severe obstructive sleep apnea. He had a split-night sleep study at [facility] on 08/28/2007. I reviewed the report. The referring physician at the time was Dr. Einar Gip, and interpreting physician was Dr. Baird Lyons. His sleep efficiency at baseline was 67%, during the titration study was 79.7%. REM latency was 94 minutes during the second part of the study, he had no REM sleep or slow-wave sleep during the baseline portion of the study. Total AHI at baseline was 32.9 per hour. He had no significant PLMS. Average oxygen saturation was 90%, nadir was 74%. He was titrated on CPAP from 4 cm to 24 cm. He was placed on CPAP therapy. He has not been fully compliant with treatment. He has not had supplies in over one year. He stopped using his machine over a year ago. He brought his machine today which appears to be set on auto only. I compliance download was not possible from his machine. 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 I reviewed your office note from 10/17/2015, which you kindly included. His weight has increased slightly in the past several years. Weight at the time of his sleep study was 390 pounds  he has had difficulty losing weight. He has tried phentermine before but not with success. He does not have a family history of obstructive sleep apnea but it is suspected in his father, and younger brother. The patient lives with his mother who is 68 years old. Father passed away at the age of 37 from a heart attack. The patient is single and has no children. He is a nonsmoker and does not drink any alcohol currently, never heavy drinker. He drinks coffee 1 or 2 cups per day and occasional soda and occasional sweet tea. He wakes up multiple times at night. Nocturia is about 2-3 times per night. He denies restless leg symptoms. He has chronic lower extremity swelling. He denies morning headaches.  His Epworth sleepiness score is 8 out of 24 today, his fatigue score is 40 out of 63. Does not wake up rested. He reports lack of daytime energy, he occasionally takes a nap.   His Past Medical History Is Significant For: Past Medical History  Diagnosis Date  . Herniated disc   . Hypertension   . High cholesterol   . Sleep apnea     non compliant with c-pap  . Morbid obesity ([place])   . PAC (premature atrial contraction)     His Past Surgical History Is Significant For: Past Surgical History  Procedure Laterality Date  . Knee arthroscopy  2010    Lt  . Shoulder arthroscopy  4/12    Rt  . Cardiac catheterization  02/20/2009    normal  coronary arteries  . Nm myocar perf wall motion  09/28/2007    small area of reversibility in the anterolateral wall at the apex concerning for ischemia    His Family History Is Significant For: Family History  Problem Relation Age of Onset  . Hypertension Mother   . Heart failure Mother   . Heart failure Father   . Alzheimer's disease Maternal Grandmother   . Alzheimer's disease Maternal Grandfather     His Social History Is Significant For: Social History   Social History  . Marital Status: Divorced    Spouse Name: N/A  . Number of Children: 0  . Years  of Education: college   Occupational History  . Retired    Social History Main Topics  . Smoking status: Never Smoker   . Smokeless tobacco: Never Used  . Alcohol Use: No  . Drug Use: No  . Sexual Activity: Not Asked   Other Topics Concern  . None   Social History Narrative   Denies caffeine use     His Allergies Are:  Allergies  Allergen Reactions  . Codeine   . Oxycodone   :   His Current Medications Are:  Outpatient Encounter Prescriptions as of 10/25/2015  Medication Sig  . lisinopril-hydrochlorothiazide (PRINZIDE,ZESTORETIC) 20-25 MG per tablet Take 1 tablet by mouth daily.  Marland Kitchen lovastatin (MEVACOR) 40 MG tablet Take 1 tablet (40 mg total) by mouth at bedtime.  . Multiple Vitamin (MULTIVITAMIN) tablet Take 1 tablet by mouth daily. Off and on  . [DISCONTINUED] phentermine 37.5 MG capsule TAKE ONE CAPSULE BY MOUTH ONCE DAILY IN THE MORNING   No facility-administered encounter medications on file as of 10/25/2015.  :  Review of Systems:  Out of a complete 14 point review of systems, all are reviewed and negative with the exception of these symptoms as listed below:   Review of Systems  Constitutional: Positive for fatigue.  Neurological:       Last sleep study was around 2009. Prescribed CPAP. Has not used CPAP in 1+ years.     Epworth Sleepiness Scale 0= would never doze 1= slight chance of dozing 2= moderate chance of dozing 3= high chance of dozing  Sitting and reading:0 Watching TV:2 Sitting inactive in a public place (ex. Theater or meeting):1 As a passenger in a car for an hour without a break:1 Lying down to rest in the afternoon:3 Sitting and talking to someone:0 Sitting quietly after lunch (no alcohol):1 In a car, while stopped in traffic:0 Total:8  Objective:  Neurologic Exam  Physical Exam Physical Examination:   Filed Vitals:   10/25/15 1414  BP: 141/82  Pulse: 76  Resp: 22    General Examination: The patient is a very pleasant 60  y.o. male in no acute distress. He appears well-developed and well-nourished and adequately groomed. He is morbidly obese.   HEENT: Normocephalic, atraumatic, pupils are equal, round and reactive to light and accommodation. Funduscopic exam is normal with sharp disc margins noted. he has a mild right-sided cataract, he is status post cataract repair on the left. He has prescription eyeglasses.  Extraocular tracking is good without limitation to gaze excursion or nystagmus noted. Normal smooth pursuit is noted. Hearing is grossly intact. Face is symmetric with normal facial animation and normal facial sensation. Speech is clear with no dysarthria noted. There is no hypophonia. There is no lip, neck/head, jaw or voice tremor. Neck shows limited range of motion. There are no carotid bruits on auscultation. Oropharynx exam  reveals: moderate mouth dryness, adequate dental hygiene and marked airway crowding,  but difficult to assess fully as he has a strong gag reflex. He has redundant and thick soft palate and a larger tongue. Mallampati is class III. Tongue protrudes centrally and palate elevates symmetrically. Neck size is 21.5 inches. Nasal inspection reveals no significant nasal mucosal bogginess or redness and no septal deviation.   Chest: Clear to auscultation without wheezing, rhonchi or crackles noted.  Heart: S1+S2+0, regular and normal without murmurs, rubs or gallops noted.   Abdomen: Soft, non-tender and non-distended with normal bowel sounds appreciated on auscultation.  Extremities: There is 2+ pitting edema in the distal lower extremities bilaterally. Pedal pulses are intact.  Skin: dry with chronic stasis-like changes are noted in the distal lower extremities with thickening of the skin. There are no obvious varicose veins.  Musculoskeletal: exam reveals decrease in range of motion in his neck, low back pain with increase in lumbar kyphosis, decrease in range of motion in both knees, he is  status post left arthroscopic knee surgery.    Neurologically:  Mental status: The patient is awake, alert and oriented in all 4 spheres. His immediate and remote memory, attention, language skills and fund of knowledge are appropriate. There is no evidence of aphasia, agnosia, apraxia or anomia. Speech is clear with normal prosody and enunciation. Thought process is linear. Mood is normal and affect is normal.  Cranial nerves II - XII are as described above under HEENT exam. In addition: shoulder shrug is normal with equal shoulder height noted. Motor exam: Normal bulk, strength and tone is noted. There is no drift, tremor or rebound. Romberg is negative. Reflexes are 1+ in the UEs and both knees,  throughout. Babinski: Toes are flexor bilaterally. Fine motor skills and coordination: intact with normal finger taps, normal hand movements, normal rapid alternating patting, normal foot taps and normal foot agility.  Cerebellar testing: No dysmetria or intention tremor on finger to nose testing. Heel to shin is not possible for him.  Sensory exam: intact to light touch, pinprick, vibration, temperature sense in the UEs and decreased vibration sense in the distal lower extremity is bilaterally.  Gait, station and balance: He stands with difficulty. No veering to one side is noted. No leaning to one side is noted. Posture is mildly stooped, advanced for age, he walks slowly and insecurely. He has a walking stick. He turns slowly. Tandem walk is not possible for him.   Assessment and Plan:  In summary, CAMERON CHREST is a very pleasant 60 y.o.-year old male with an underlying medical history of Arthritis, neck pain, back pain, hypertension, hyperlipidemia, and morbid obesity, whose history and physical exam are in keeping with  significant obstructive sleep apnea (OSA). I had a long chat with the patient about my findings and the diagnosis of OSA, its prognosis and treatment options. We talked about medical  treatments, surgical interventions and non-pharmacological approaches. I explained in particular the risks and ramifications of untreated moderate to severe OSA, especially with respect to developing cardiovascular disease down the Road, including congestive heart failure, difficult to treat hypertension, cardiac arrhythmias, or stroke. Even type 2 diabetes has, in part, been linked to untreated OSA. Symptoms of untreated OSA include daytime sleepiness, memory problems, mood irritability and mood disorder such as depression and anxiety, lack of energy, as well as recurrent headaches, especially morning headaches. We talked about trying to maintain a healthy lifestyle in general, as well as the importance of weight  control. I encouraged the patient to eat healthy, exercise daily and keep well hydrated, to keep a scheduled bedtime and wake time routine, to not skip any meals and eat healthy snacks in between meals. I advised the patient not to drive when feeling sleepy. I recommended the following at this time: sleep study with potential positive airway pressure titration. (We will score hypopneas at 4% and split the sleep study into diagnostic and treatment portion, if the estimated. 2 hour AHI is >15/h).   I explained the sleep test procedure to the patient and also outlined possible surgical and non-surgical treatment options of OSA, including the use of a custom-made dental device (which would require a referral to a specialist dentist or oral surgeon), upper airway surgical options, such as pillar implants, radiofrequency surgery, tongue base surgery, and UPPP (which would involve a referral to an ENT surgeon). Rarely, jaw surgery such as mandibular advancement may be considered.  I also explained the CPAP treatment option to the patient, who indicated that he would be willing to try CPAP if the need arises. I explained the importance of being compliant with PAP treatment, not only for insurance purposes but  primarily to improve His symptoms, and for the patient's long term health benefit, including to reduce His cardiovascular risks. I answered all his questions today and the patient was in agreement. I would like to see him back after the sleep study is completed and encouraged him to call with any interim questions, concerns, problems or updates.   Thank you very much for allowing me to participate in the care of this nice patient. If I can be of any further assistance to you please do not hesitate to call me at [phone].  Sincerely,   Star Age, MD, PhD

## 2015-10-29 ENCOUNTER — Encounter: Payer: Self-pay | Admitting: *Deleted

## 2015-11-07 ENCOUNTER — Ambulatory Visit (INDEPENDENT_AMBULATORY_CARE_PROVIDER_SITE_OTHER): Payer: Medicare Other | Admitting: Neurology

## 2015-11-07 DIAGNOSIS — G4733 Obstructive sleep apnea (adult) (pediatric): Secondary | ICD-10-CM | POA: Diagnosis not present

## 2015-11-07 DIAGNOSIS — G479 Sleep disorder, unspecified: Secondary | ICD-10-CM

## 2015-11-07 DIAGNOSIS — G4734 Idiopathic sleep related nonobstructive alveolar hypoventilation: Secondary | ICD-10-CM

## 2015-11-08 NOTE — Sleep Study (Signed)
Please see the scanned sleep study interpretation located in the procedure tab in the chart view section.  

## 2015-11-16 ENCOUNTER — Telehealth: Payer: Self-pay | Admitting: Neurology

## 2015-11-16 DIAGNOSIS — G4733 Obstructive sleep apnea (adult) (pediatric): Secondary | ICD-10-CM

## 2015-11-16 NOTE — Telephone Encounter (Signed)
Diana:  Patient referred by Dr. Rex Kras, seen by me on 10/25/15, split study on 11/07/15, Ins: MCR/MCD. Please call and notify patient that the recent sleep study confirmed the diagnosis of severe OSA. He did very well with CPAP during the study with significant improvement of the respiratory events. Therefore, I would like start the patient on CPAP therapy at home by prescribing a machine for home use. I placed the order in the chart. The patient will need a follow up appointment with me in 8 to 10 weeks post set up that has to be scheduled; please go ahead and schedule while you have the patient on the phone and make sure patient understands the importance of keeping this window for the FU appointment, as it is often an insurance requirement and failing to adhere to this may result in losing coverage for sleep apnea treatment.  Please re-enforce the importance of compliance with treatment and the need for [place] to monitor compliance data - again an insurance requirement and good feedback for the patient as far as how they are doing.  Also remind patient, that any upcoming CPAP machine or mask issues, should be first addressed with the DME company. Please ask if patient has a preference regarding DME company.  Please arrange for CPAP set up at home through a DME company of patient's choice - once you have spoken to the patient - and faxed/routed report to PCP and referring MD (if other than PCP), you can close this encounter, thanks,   Star Age, MD, PhD [facility] ([place])

## 2015-11-20 NOTE — Telephone Encounter (Signed)
LM for patient to call back.

## 2015-11-20 NOTE — Telephone Encounter (Signed)
Patient returned Diana's call, please call [phone].

## 2015-11-20 NOTE — Telephone Encounter (Signed)
I called patient back. He is aware of results and recommendations. He is willing to proceed with treatment. I will fax order to [facility]. I will send report to PCP. I will also send the patient a letter reminding him to make appt and stress the importance of compliance.

## 2016-04-20 ENCOUNTER — Other Ambulatory Visit: Payer: Self-pay | Admitting: Cardiovascular Disease

## 2016-04-24 ENCOUNTER — Other Ambulatory Visit: Payer: Self-pay | Admitting: Cardiovascular Disease

## 2016-05-13 ENCOUNTER — Telehealth: Payer: Self-pay

## 2016-05-13 NOTE — Telephone Encounter (Signed)
Heather from [employer] reached out to me about this patient. He did not satisfy insurance requirements for CPAP. This is what Heather sent me:  Good Morning Beverlee Nims,  Mr. Twyford did not meet compliance within the 90 day time period that Medicare requires.  I spoke with him this morning and looked at his usage and I can tell he is really trying.  So instead of picking up his machine I would like to let him continue to use the one he currently has until we can re-qualify him.  He will need an in office visit discussing that he has not met compliance but that Dr. Rexene Alberts plans to do a new Split night or titration study to find the best pressure and mask for him.  Once I get those notes and that new study I can re qualify him and get him set up to restart billing Medicare and get him compliant.  He asked that I contact you and advised that he is willing to come in for that visit as well as a new study so he can continue to be on Cpap therapy.  Thank you in advance for your help as always and Happy Friday :)   Thank you so much!  Vivia Budge, CSR [facility] [employer] [address] [place], [place] [address]  Phone-  [phone] Fax-  [phone] Email-  [email or web address]  I called patient and he is willing to come in for an appt. He requests this Thursday morning but nothing is available right now. I will call him back if something opens.

## 2016-05-14 NOTE — Telephone Encounter (Signed)
I called patient back and he was able to take appt tomorrow.

## 2016-05-15 ENCOUNTER — Encounter: Payer: Self-pay | Admitting: Neurology

## 2016-05-15 ENCOUNTER — Ambulatory Visit (INDEPENDENT_AMBULATORY_CARE_PROVIDER_SITE_OTHER): Payer: Medicare Other | Admitting: Neurology

## 2016-05-15 VITALS — BP 159/87 | HR 50 | Resp 22 | Ht 72.0 in | Wt 397.0 lb

## 2016-05-15 DIAGNOSIS — G4733 Obstructive sleep apnea (adult) (pediatric): Secondary | ICD-10-CM

## 2016-05-15 DIAGNOSIS — Z9989 Dependence on other enabling machines and devices: Secondary | ICD-10-CM

## 2016-05-15 DIAGNOSIS — M7989 Other specified soft tissue disorders: Secondary | ICD-10-CM | POA: Diagnosis not present

## 2016-05-15 NOTE — Progress Notes (Signed)
Subjective:    Patient ID: Brandon Marks is a 60 y.o. male.  HPI     Interim history:   Brandon Marks is a 60 year old right-handed gentleman with an underlying medical history of hypertension, hyperlipidemia, Neck pain, back pain, arthritis and morbid obesity, who presents for follow-up consultation of his obstructive sleep apnea, after his split-night sleep study from 11/07/2015. The patient is unaccompanied today. I first met him on 10/25/2015 at the request of his primary care physician, at which time he reported a prior diagnosis of obstructive sleep apnea and needed reevaluation. I invited him for sleep study. He had a split-night sleep study on 11/07/2015. Baseline sleep efficiency was 70.3%, sleep latency 8.5 minutes and wake after sleep onset was 43 minutes with moderate sleep fragmentation noted. He had an elevated arousal index. He had an increased percentage of stage II sleep, absence of slow-wave sleep and absence of REM sleep prior to CPAP initiation. He had no significant PLMS before or after CPAP initiation. Average oxygen saturation was 88% only, nadir was 75%. Total AHI was elevated at 65.4 per hour, and the severe range. He was therefore titrated on CPAP during the second part of the study, sleep efficiency was 98.2%, sleep latency 4 minutes and wake after sleep onset was nearly 0 minutes. He had absence of slow-wave sleep and REM sleep was increased at 29.9%. Average oxygen saturation was improved at 91%, nadir was 79%. CPAP was titrated from 5 cm to 11 cm. Supine REM sleep was achieved on the final pressure, AHI was 1.4 per hour. Based on his test results are prescribed CPAP therapy for home use.   He was unfortunately not seen in follow-up within the first 90 days and also not fully compliant with in the first 90 days of treatment.   Today, 05/15/2016: I reviewed his CPAP compliance data from 04/14/2016 through 05/13/2016 which is a total of 30 days, during which time he used his  machine 27 days with percent used days greater than 4 hours at 67%, indicating suboptimal compliance with an average usage of 4 hours and 59 minutes, residual AHI borderline at 4.7, leak high at 64.3 L/m for the 95th percentile, pressure at 11 cm with EPR of 3. His 90 day compliance from 02/14/2016 through 05/13/2016 was 63%.   He presents for reevaluation and an recertification of obstructive sleep apnea and get restarted on CPAP therapy.   Today, 05/15/2016: He reports trying to use the CPAP, puts it on at night, wakes up some nights with the mask off. He has also not always taken the CPAP on his overnight trips. He wants to be able to keep machine and willing to use it, but understands that by insurance guidelines he has not fulfilled compliance criteria and therefore will most likely need a repeat sleep study for recertification for CPAP therapy. He does note that he has been able to use CPAP better than in the past. He has also noted improvement in his sleep. He would like for his brother to be checked out for sleep apnea as well and he suspects that his father had sleep apnea. He has not been able to lose any significant amount of weight, weight tenths to fluctuate. He is limited in his mobility because of low back pain. He also has chronic lower extremity swelling.   Previously:   10/25/2015: He was previously diagnosed with severe obstructive sleep apnea. He had a split-night sleep study at [facility] on 08/28/2007. I reviewed  the report. The referring physician at the time was Dr. Einar Gip, and interpreting physician was Dr. Baird Lyons. His sleep efficiency at baseline was 67%, during the titration study was 79.7%. REM latency was 94 minutes during the second part of the study, he had no REM sleep or slow-wave sleep during the baseline portion of the study. Total AHI at baseline was 32.9 per hour. He had no significant PLMS. Average oxygen saturation was 90%, nadir was 74%. He was titrated  on CPAP from 4 cm to 24 cm. He was placed on CPAP therapy. He has not been fully compliant with treatment. He has not had supplies in over one year. He stopped using his machine over a year ago. He brought his machine today which appears to be set on auto only. I compliance download was not possible from his machine. 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 I reviewed your office note from 10/17/2015, which you kindly included. His weight has increased slightly in the past several years. Weight at the time of his sleep study was 390 pounds he has had difficulty losing weight. He has tried phentermine before but not with success. He does not have a family history of obstructive sleep apnea but it is suspected in his father, and younger brother. The patient lives with his mother who is 37 years old. Father passed away at the age of 42 from a heart attack. The patient is single and has no children. He is a nonsmoker and does not drink any alcohol currently, never heavy drinker. He drinks coffee 1 or 2 cups per day and occasional soda and occasional sweet tea. He wakes up multiple times at night. Nocturia is about 2-3 times per night. He denies restless leg symptoms. He has chronic lower extremity swelling. He denies morning headaches.  His Epworth sleepiness score is 8 out of 24 today, his fatigue score is 40 out of 63. Does not wake up rested. He reports lack of daytime energy, he occasionally takes a nap.    His Past Medical History Is Significant For: Past Medical History:  Diagnosis Date  . Herniated disc   . High cholesterol   . Hypertension   . Morbid obesity ([place])   . PAC (premature atrial contraction)   . Sleep apnea    non compliant with c-pap    His Past Surgical History Is Significant For: Past Surgical History:  Procedure Laterality Date  .  CARDIAC CATHETERIZATION  02/20/2009   normal coronary arteries  . KNEE ARTHROSCOPY  2010   Lt  . NM MYOCAR PERF WALL MOTION  09/28/2007   small area of reversibility in the anterolateral wall at the apex concerning for ischemia  . SHOULDER ARTHROSCOPY  4/12   Rt    His Family History Is Significant For: Family History  Problem Relation Age of Onset  . Hypertension Mother   . Heart failure Mother   . Heart failure Father   . Alzheimer's disease Maternal Grandmother   . Alzheimer's disease Maternal Grandfather     His Social History Is Significant For: Social History   Social History  . Marital status: Divorced    Spouse name: N/A  . Number of children: 0  . Years of education: college   Occupational History  . Retired    Social History Main Topics  . Smoking status:  Never Smoker  . Smokeless tobacco: Never Used  . Alcohol use No  . Drug use: No  . Sexual activity: Not Asked   Other Topics Concern  . None   Social History Narrative   Denies caffeine use     His Allergies Are:  Allergies  Allergen Reactions  . Codeine   . Oxycodone   :   His Current Medications Are:  Outpatient Encounter Prescriptions as of 05/15/2016  Medication Sig  . lisinopril-hydrochlorothiazide (PRINZIDE,ZESTORETIC) 20-25 MG tablet TAKE ONE TABLET BY MOUTH ONCE DAILY  . lovastatin (MEVACOR) 40 MG tablet TAKE ONE TABLET BY MOUTH AT BEDTIME  . Multiple Vitamin (MULTIVITAMIN) tablet Take 1 tablet by mouth daily. Off and on   No facility-administered encounter medications on file as of 05/15/2016.   :  Review of Systems:  Out of a complete 14 point review of systems, all are reviewed and negative with the exception of these symptoms as listed below: Review of Systems  Neurological:       Patient is here to discuss CPAP usage and re-certification.     Objective:  Neurologic Exam  Physical Exam Physical Examination:   Vitals:   05/15/16 1509  BP: (!) 159/87  Pulse: (!) 50   Resp: (!) 22    General Examination: The patient is a very pleasant 60 y.o. male in no acute distress. He appears well-developed and well-nourished and adequately groomed. He is morbidly obese, gained a few lb.   HEENT: Normocephalic, atraumatic, pupils are equal, round and reactive to light and accommodation. Funduscopic exam is normal with sharp disc margins noted. He has a mild right-sided cataract, he is status post cataract repair on the left. Extraocular tracking is good without limitation to gaze excursion or nystagmus noted. Normal smooth pursuit is noted. Hearing is grossly intact. Face is symmetric with normal facial animation and normal facial sensation. Speech is clear with no dysarthria noted. There is no hypophonia. There is no lip, neck/head, jaw or voice tremor. Neck shows limited range of motion. There are no carotid bruits on auscultation. Oropharynx exam reveals: moderate mouth dryness, adequate dental hygiene and marked airway crowding, again difficult to assess fully as he has a strong gag reflex. He has redundant and thick soft palate and a larger tongue. Mallampati is class III. Tongue protrudes centrally and palate elevates symmetrically. Neck size is unchanged, 21.5 inches. Nasal inspection reveals no significant nasal mucosal bogginess or redness and no septal deviation.   Chest: Clear to auscultation without wheezing, rhonchi or crackles noted.  Heart: S1+S2+0, regular and normal without murmurs, rubs or gallops noted.   Abdomen: Soft, non-tender and non-distended with normal bowel sounds appreciated on auscultation.  Extremities: There is 2+ pitting edema in the distal lower extremities bilaterally. He is reminded not to wear shoes without socks.  Skin: dry with chronic stasis-like changes are noted in the distal lower extremities with thickening of the skin and redness. There are no obvious varicose veins.  Musculoskeletal: exam reveals decrease in range of motion in  his neck, low back pain with increase in lumbar kyphosis, decrease in range of motion in both knees, he is status post left arthroscopic knee surgery.    Neurologically:  Mental status: The patient is awake, alert and oriented in all 4 spheres. His immediate and remote memory, attention, language skills and fund of knowledge are appropriate. There is no evidence of aphasia, agnosia, apraxia or anomia. Speech is clear with normal prosody and enunciation. Thought process is  linear. Mood is normal and affect is normal.  Cranial nerves II - XII are as described above under HEENT exam. In addition: shoulder shrug is normal with equal shoulder height noted. Motor exam: Normal bulk, strength and tone is noted. There is no drift, tremor or rebound. Romberg is not testable, as he cannot stand narrow based. Reflexes are 1+ in the UEs and trace in the knees, absent in the ankles. Fine motor skills and coordination: mildly impaired in the LEs, intact in the UEs.   Cerebellar testing: No dysmetria or intention tremor on finger to nose testing. Heel to shin is not possible for him.  Sensory exam: intact to light touch in the UEs and decreased vibration sense in the distal lower extremity is bilaterally.  Gait, station and balance: He stands with significant difficulty. No veering to one side is noted. No leaning to one side is noted. Posture is mildly stooped, advanced for age, kyphosis in the lower back, he walks slowly and insecurely. He has a walking stick. He turns slowly. Tandem walk is not possible for him.   Assessment and Plan:  In summary, IZRAEL PEAK is a very pleasant 60 year old male with an underlying medical history of Arthritis, neck pain, back pain, hypertension, hyperlipidemia, and morbid obesity, who presents for follow-up consultation of his severe obstructive sleep apnea. He had a split-night sleep study in April 2017 and was placed on CPAP of 11 cm. Unfortunately, he has not been fully  compliant with CPAP therapy, just a few percentage points of as far as Medicare criteria for compliance. Part of the issue has been that he has a tendency to pull off the mask at night and the other issue has been that she has not always taken the machine with him when he went for an overnight trip. He is strongly advised to be fully compliant with CPAP usage but we will have to bring him back for re-certification for sleep apnea treatment. He has documented severe obstructive sleep apnea, I will request a full night CPAP titration study, during which we can also work with him on mask tolerance and try a different mask. He has been using nasal pillows. He is motivated to go back on treatment and be compliant with it. He has noted improvement in his sleep and understands the implications of untreated severe obstructive sleep apnea, particularly with respect to cardiovascular risks.  I again explained in particular the risks and ramifications of untreated moderate to severe OSA, especially with respect to developing cardiovascular disease down the Road, including congestive heart failure, difficult to treat hypertension, cardiac arrhythmias, or stroke. Even type 2 diabetes has, in part, been linked to untreated OSA. Symptoms of untreated OSA include daytime sleepiness, memory problems, mood irritability and mood disorder such as depression and anxiety, lack of energy, as well as recurrent headaches, especially morning headaches. We talked about trying to maintain a healthy lifestyle in general, as well as the importance of weight control. He is strongly encouraged to work on weight loss. I advised the patient not to drive when feeling sleepy. I recommended the following at this time: sleep study with full night CPAP titration. (We will score hypopneas at 4% and try a different interface).  I explained the importance of being compliant with PAP treatment, not only for insurance purposes but primarily to improve His  symptoms, and for the patient's long term health benefit, including to reduce His cardiovascular risks. I answered all his questions today and the  patient was in agreement. I would like to see him back after the sleep study is completed and encouraged him to call with any interim questions, concerns, problems or updates.

## 2016-05-15 NOTE — Patient Instructions (Signed)
We will do a repeat CPAP titration study. We will have to document CPAP compliance.  You are almost fully compliant!  Please continue to work on weight loss.

## 2016-06-26 ENCOUNTER — Encounter: Payer: Self-pay | Admitting: Neurology

## 2016-07-06 ENCOUNTER — Ambulatory Visit (INDEPENDENT_AMBULATORY_CARE_PROVIDER_SITE_OTHER): Payer: Medicare Other | Admitting: Neurology

## 2016-07-06 DIAGNOSIS — M7989 Other specified soft tissue disorders: Secondary | ICD-10-CM

## 2016-07-06 DIAGNOSIS — Z9989 Dependence on other enabling machines and devices: Secondary | ICD-10-CM

## 2016-07-06 DIAGNOSIS — G4720 Circadian rhythm sleep disorder, unspecified type: Secondary | ICD-10-CM

## 2016-07-06 DIAGNOSIS — G4733 Obstructive sleep apnea (adult) (pediatric): Secondary | ICD-10-CM | POA: Diagnosis not present

## 2016-07-06 DIAGNOSIS — R9431 Abnormal electrocardiogram [ECG] [EKG]: Secondary | ICD-10-CM

## 2016-07-14 ENCOUNTER — Telehealth: Payer: Self-pay

## 2016-07-14 NOTE — Telephone Encounter (Signed)
-----   Message from Star Age, MD sent at 07/14/2016  8:28 AM EST ----- Patient referred by Dr. Rex Kras, seen by me on 10/25/15, split study on 11/07/15, but did not come back for FU and needed another sleep study to requalify, had CPAP study on 07/06/16:  Please call and inform patient that I have entered an order for treatment with positive airway pressure (PAP) treatment of obstructive sleep apnea (OSA). He did well during the latest sleep study with CPAP. We will, therefore, arrange for a machine for home use through a DME (durable medical equipment) company of His choice; and I will see the patient back in follow-up in about 8-10 weeks. Please also explain to the patient that I will be looking out for compliance data, which can be downloaded from the machine (stored on an SD card, that is inserted in the machine) or via remote access through a modem, that is built into the machine. At the time of the followup appointment we will discuss sleep study results and how it is going with PAP treatment at home. Please advise patient to bring His machine at the time of the first FU visit, even though this is cumbersome. Bringing the machine for every visit after that will likely not be needed, but often helps for the first visit to troubleshoot if needed. Please re-enforce the importance of compliance with treatment and the need for [place] to monitor compliance data - often an insurance requirement and actually good feedback for the patient as far as how they are doing.  Also remind patient, that any interim PAP machine or mask issues should be first addressed with the DME company, as they can often help better with technical and mask fit issues. Please ask if patient has a preference regarding DME company.  Please also make sure, the patient has a follow-up appointment with me in about 8-10 weeks from the setup date, thanks.  Once you have spoken to the patient - and faxed/routed report to PCP and referring MD (if  other than PCP), you can close this encounter, thanks,   Star Age, MD, PhD [facility] ([place])

## 2016-07-14 NOTE — Progress Notes (Signed)
Patient referred by Dr. Rex Kras, seen by me on 10/25/15, split study on 11/07/15, but did not come back for FU and needed another sleep study to requalify, had CPAP study on 07/06/16:  Please call and inform patient that I have entered an order for treatment with positive airway pressure (PAP) treatment of obstructive sleep apnea (OSA). He did well during the latest sleep study with CPAP. We will, therefore, arrange for a machine for home use through a DME (durable medical equipment) company of His choice; and I will see the patient back in follow-up in about 8-10 weeks. Please also explain to the patient that I will be looking out for compliance data, which can be downloaded from the machine (stored on an SD card, that is inserted in the machine) or via remote access through a modem, that is built into the machine. At the time of the followup appointment we will discuss sleep study results and how it is going with PAP treatment at home. Please advise patient to bring His machine at the time of the first FU visit, even though this is cumbersome. Bringing the machine for every visit after that will likely not be needed, but often helps for the first visit to troubleshoot if needed. Please re-enforce the importance of compliance with treatment and the need for [place] to monitor compliance data - often an insurance requirement and actually good feedback for the patient as far as how they are doing.  Also remind patient, that any interim PAP machine or mask issues should be first addressed with the DME company, as they can often help better with technical and mask fit issues. Please ask if patient has a preference regarding DME company.  Please also make sure, the patient has a follow-up appointment with me in about 8-10 weeks from the setup date, thanks.  Once you have spoken to the patient - and faxed/routed report to PCP and referring MD (if other than PCP), you can close this encounter, thanks,   Star Age, MD,  PhD [facility] ([place])

## 2016-07-14 NOTE — Telephone Encounter (Signed)
I spoke to pt and advised him of his sleep study results. Pt is agreeable to the new cpap order; he says that he already has a cpap and just needs to pressures adjusted. Pt is using [employer] for his DME. I advised pt that he needs a follow up with Dr. Rexene Alberts in 8-10 weeks.  A follow up was made for 09/29/16 at 1:00pm. Pt verbalized understanding of results. Pt had no questions at this time but was encouraged to call back if questions arise.

## 2016-07-14 NOTE — Addendum Note (Signed)
Addended by: Star Age on: 07/14/2016 08:29 AM   Modules accepted: Orders

## 2016-07-14 NOTE — Procedures (Signed)
PATIENT'S NAME:  Brandon Marks, Brandon Marks  DOB:      27-Jan-1956      MR#:    [id number]     DATE OF RECORDING: 07/06/2016 REFERRING M.D.:  Tamsen Roers, MD Study Performed:   CPAP  Titration HISTORY:  60 year old man with a history of hypertension, hyperlipidemia, Neck pain, back pain, arthritis and morbid obesity, who presents for a full night CPAP titration study to requalify for CPAP therapy. He had a split-night sleep study on 11/07/2015, which showed a total AHI of 65.4 per hour, O2 nadir was 79%. CPAP was titrated from 5 cm to 11 cm.  The patient endorsed the Epworth Sleepiness Scale at 8/24 points. [name] weight 397 pounds with a height of 72 (inches), resulting in a BMI of 53.7 kg/m2. [name] neck circumference measured 21.5 inches.   CURRENT MEDICATIONS: Lisinopril, Lovastatin and multi-Vitamin   PROCEDURE:  This is a multichannel digital polysomnogram utilizing the SomnoStar 11.2 system.  Electrodes and sensors were applied and monitored per AASM Specifications.   EEG, EOG, Chin and Limb EMG, were sampled at 200 Hz.  ECG, Snore and Nasal Pressure, Thermal Airflow, Respiratory Effort, CPAP Flow and Pressure, Oximetry was sampled at 50 Hz. Digital video and audio were recorded.      The patient was fitted with a large P10 nasal pillows interface. CPAP was initiated at 5 cmH20 with heated humidity per AASM split night standards and pressure was advanced to 10 cmH20 because of hypopneas, apneas and desaturations.  At a PAP pressure of 10 cmH20, there was a reduction of the AHI to 0 per hour, AHI of 0/hour, O2 nadir of 89%. Brief supine REM sleep was achieved.    Lights Out was at 22:52 and Lights On at 05:05. Total recording time (TRT) was 373 minutes, with a total sleep time (TST) of 266 minutes. [name] sleep latency was 39 minutes. REM latency was 169.5 minutes, which is delayed.  The sleep efficiency was 71.3 %.    SLEEP ARCHITECTURE: WASO (Wake after sleep onset)  was 68 minutes  with longer periods of wakefulness and three bathroom breaks.  There were 8.5 minutes in Stage N1, 226 minutes Stage N2, 0 minutes Stage N3 and 31.5 minutes in Stage REM.  The percentage of Stage N1 was 3.2%, Stage N2 was 85%, Stage N3 was absent, and Stage R (REM sleep) was 11.8%.   Audio and video analysis did not show any abnormal or unusual movements, behaviors, phonations or vocalizations.  The patient took 3 bathroom breaks.  The EKG showed occasional PVCs and PACs.   RESPIRATORY ANALYSIS:  There was a total of 8 respiratory events: 0 obstructive apneas, 0 central apneas and 0 mixed apneas with a total of 0 apneas and an apnea index (AI) of 0 /hour. There were 8 hypopneas with a hypopnea index of 1.8/hour. The patient also had 0 respiratory event related arousals (RERAs).      The total APNEA/HYPOPNEA INDEX  (AHI) was 1.8 /hour and the total RESPIRATORY DISTURBANCE INDEX was 1.8 .hour  1 events occurred in REM sleep and 7 events in NREM. The REM AHI was 1.9 /hour versus a non-REM AHI of 1.8 /hour.  The patient spent 51.5 minutes of total sleep time in the supine position and 215 minutes in non-supine. The supine AHI was 1.2, versus a non-supine AHI of 2.0.  OXYGEN SATURATION & C02:  The baseline 02 saturation was 94%, with the lowest being 83%. Time spent below 89% saturation  equaled 99 minutes.  PERIODIC LIMB MOVEMENTS:    The patient had a total of 0 Periodic Limb Movements. The Periodic Limb Movement (PLM) index was 0 and the PLM Arousal index was 0 /hour.  Post-study, the patient indicated that sleep was the same as usual.   DIAGNOSIS 1. Obstructive Sleep Apnea  2. Non-specific abnormal EKG   3. Dysfunctions associated with sleep stages or arousal from sleep  PLANS/RECOMMENDATIONS: 1. This study demonstrates resolution of the patient's obstructive sleep apnea with CPAP therapy. I will, therefore, start the patient on home CPAP treatment. I would like to start him on a pressure of 11 cm  via nasal pillows with heated humidity. The O2 nadir on the final titration pressure was 89%; therefore, I suggest a slightly higher pressure at home to ensure better oxygen saturations during supine REM sleep. The patient should be reminded to be fully compliant with PAP therapy to improve sleep related symptoms and decrease long term cardiovascular risks. The patient should be reminded, that it may take up to 3 months to get fully used to using PAP with all planned sleep. The earlier full compliance is achieved, the better long term compliance tends to be. Please note that untreated obstructive sleep apnea carries additional perioperative morbidity. Patients with significant obstructive sleep apnea should receive perioperative PAP therapy and the surgeons and particularly the anesthesiologist should be informed of the diagnosis and the severity of the sleep disordered breathing. 2. The patient should be cautioned not to drive, work at heights, or operate dangerous or heavy equipment when tired or sleepy. Review and reiteration of good sleep hygiene measures should be pursued with any patient. 3. The study showed occasional PACs and PVCs on single lead EKG; clinical correlation is recommended and consultation with cardiology may be feasible.  4. This study shows sleep fragmentation and abnormal sleep stage percentages; these are nonspecific findings and per se do not signify an intrinsic sleep disorder or a cause for the patient's sleep-related symptoms. Causes include (but are not limited to) the first night effect of the sleep study, circadian rhythm disturbances, medication effect or an underlying mood disorder or medical problem.  5. The patient will be seen in follow-up by Dr. Rexene Alberts at [facility] for discussion of the test results and further management strategies. The referring provider will be notified of the test results.  I certify that I have reviewed the entire raw data recording prior to the issuance of  this report in accordance with the Standards of Accreditation of the American Academy of Sleep Medicine (AASM)    Star Age, MD, PhD Diplomat, American Board of Psychiatry and Neurology (Neurology and Sleep Medicine)

## 2016-07-30 ENCOUNTER — Other Ambulatory Visit: Payer: Self-pay | Admitting: Cardiovascular Disease

## 2016-09-29 ENCOUNTER — Ambulatory Visit (INDEPENDENT_AMBULATORY_CARE_PROVIDER_SITE_OTHER): Payer: Medicare Other | Admitting: Adult Health

## 2016-09-29 ENCOUNTER — Telehealth: Payer: Self-pay

## 2016-09-29 ENCOUNTER — Encounter: Payer: Self-pay | Admitting: Adult Health

## 2016-09-29 VITALS — HR 78 | Resp 24 | Ht 72.0 in | Wt >= 6400 oz

## 2016-09-29 DIAGNOSIS — G4733 Obstructive sleep apnea (adult) (pediatric): Secondary | ICD-10-CM

## 2016-09-29 DIAGNOSIS — Z9989 Dependence on other enabling machines and devices: Secondary | ICD-10-CM

## 2016-09-29 NOTE — Progress Notes (Addendum)
PATIENT: Brandon Marks DOB: [birth year]  REASON FOR VISIT: follow up- obstructive sleep apnea on CPAP HISTORY FROM: patient  HISTORY OF PRESENT ILLNESS: Brandon Marks is a 61 year old male with a history of obstructive sleep apnea on CPAP. He returns today for a compliance download. His download indicates that he uses machine 30 out of 30 days for compliance of 100%. He only uses machine greater than 4 hours 14 out of 30 days for compliance of 47%. On average he uses his machine 4 hours and 18 minutes. His residual AHI is 2.1 on 11 cm of water. He does have a significant leak in the 95th percentile at 74.1 L/m. The patient states that often the mask slides completely off his face. He states that there are also times that the machine will cut on by itself and will be blowing air out. The patient states that when the CPAP is working right he notices the benefit. He states that there are some nights he has trouble sleeping due to knee pain. He returns today for an evaluation.  HISTORY Brandon Marks is a 61 year old right-handed gentleman with an underlying medical history of hypertension, hyperlipidemia, Neck pain, back pain, arthritis and morbid obesity, who presents for follow-up consultation of his obstructive sleep apnea, after his split-night sleep study from 11/07/2015. The patient is unaccompanied today. I first met him on 10/25/2015 at the request of his primary care physician, at which time he reported a prior diagnosis of obstructive sleep apnea and needed reevaluation. I invited him for sleep study. He had a split-night sleep study on 11/07/2015. Baseline sleep efficiency was 70.3%, sleep latency 8.5 minutes and wake after sleep onset was 43 minutes with moderate sleep fragmentation noted. He had an elevated arousal index. He had an increased percentage of stage II sleep, absence of slow-wave sleep and absence of REM sleep prior to CPAP initiation. He had no significant PLMS before or after CPAP  initiation. Average oxygen saturation was 88% only, nadir was 75%. Total AHI was elevated at 65.4 per hour, and the severe range. He was therefore titrated on CPAP during the second part of the study, sleep efficiency was 98.2%, sleep latency 4 minutes and wake after sleep onset was nearly 0 minutes. He had absence of slow-wave sleep and REM sleep was increased at 29.9%. Average oxygen saturation was improved at 91%, nadir was 79%. CPAP was titrated from 5 cm to 11 cm. Supine REM sleep was achieved on the final pressure, AHI was 1.4 per hour. Based on his test results are prescribed CPAP therapy for home use.   He was unfortunately not seen in follow-up within the first 90 days and also not fully compliant with in the first 90 days of treatment.   Today, 05/15/2016: I reviewed his CPAP compliance data from 04/14/2016 through 05/13/2016 which is a total of 30 days, during which time he used his machine 27 days with percent used days greater than 4 hours at 67%, indicating suboptimal compliance with an average usage of 4 hours and 59 minutes, residual AHI borderline at 4.7, leak high at 64.3 L/m for the 95th percentile, pressure at 11 cm with EPR of 3. His 90 day compliance from 02/14/2016 through 05/13/2016 was 63%.   He presents for reevaluation and an recertification of obstructive sleep apnea and get restarted on CPAP therapy.   Today, 05/15/2016: He reports trying to use the CPAP, puts it on at night, wakes up some nights with the mask off. He  has also not always taken the CPAP on his overnight trips. He wants to be able to keep machine and willing to use it, but understands that by insurance guidelines he has not fulfilled compliance criteria and therefore will most likely need a repeat sleep study for recertification for CPAP therapy. He does note that he has been able to use CPAP better than in the past. He has also noted improvement in his sleep. He would like for his brother to be checked out for  sleep apnea as well and he suspects that his father had sleep apnea. He has not been able to lose any significant amount of weight, weight tenths to fluctuate. He is limited in his mobility because of low back pain. He also has chronic lower extremity swelling.   Previously:   10/25/2015: He was previously diagnosed withsevereobstructive sleep apnea. He had a split-night sleep study at [facility] on 08/28/2007. I reviewed the report. The referring physician at the time was Dr. Einar Gip, and interpreting physician was Dr. Baird Lyons. His sleep efficiency at baseline was 67%, during the titration study was 79.7%. REM latency was 94 minutes during the second part of the study, he had no REM sleep or slow-wave sleep during the baseline portion of the study. Total AHI at baseline was 32.9 per hour. He had no significant PLMS. Average oxygen saturation was 90%, nadir was 74%. He was titrated on CPAP from 4 cm to 24 cm. He was placed on CPAP therapy. He has not been fully compliant with treatment. He has not had supplies in over one year. He stopped using his machine over a year ago. He brought his machine today which appears to be set on auto only. I compliance download was not possible from his machine. 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 I reviewed your office note from 10/17/2015, which you kindly included. His weight has increased slightly in the past several years. Weight at the time of his sleep study was 390 poundshe has had difficulty losing weight. He has tried phentermine before but not with success. He does not have a family history of obstructive sleep apnea but it is suspected in his father, and younger brother. The patient lives with his mother who is 63 years old. Father passed away at the age of 63 from a heart attack. The patient is  single and has no children. He is a nonsmoker and does not drink any alcohol currently, never heavy drinker. He drinks coffee 1 or 2 cups per day and occasional soda and occasional sweet tea. He wakes up multiple times at night. Nocturia is about 2-3 times per night. He denies restless leg symptoms. He has chronic lower extremity swelling. He denies morning headaches.  His Epworth sleepiness score is 8 out of 24 today, his fatigue score is 40 out of 63. Does not wake up rested. He reports lack of daytime energy, he occasionally takes a nap.   REVIEW OF SYSTEMS: Out of a complete 14 system review of symptoms, the patient complains only of the following symptoms, and all other reviewed systems are negative.  See history of present illness  ALLERGIES: Allergies  Allergen Reactions  . Codeine   . Oxycodone     HOME MEDICATIONS: Outpatient Medications Prior to Visit  Medication Sig Dispense Refill  . lisinopril-hydrochlorothiazide (PRINZIDE,ZESTORETIC) 20-25 MG  tablet TAKE ONE TABLET BY MOUTH ONCE DAILY 90 tablet 0  . lovastatin (MEVACOR) 40 MG tablet TAKE ONE TABLET BY MOUTH AT BEDTIME 60 tablet 0  . Multiple Vitamin (MULTIVITAMIN) tablet Take 1 tablet by mouth daily. Off and on     No facility-administered medications prior to visit.     PAST MEDICAL HISTORY: Past Medical History:  Diagnosis Date  . Herniated disc   . High cholesterol   . Hypertension   . Morbid obesity ([place])   . PAC (premature atrial contraction)   . Sleep apnea    non compliant with c-pap    PAST SURGICAL HISTORY: Past Surgical History:  Procedure Laterality Date  . CARDIAC CATHETERIZATION  02/20/2009   normal coronary arteries  . KNEE ARTHROSCOPY  2010   Lt  . NM MYOCAR PERF WALL MOTION  09/28/2007   small area of reversibility in the anterolateral wall at the apex concerning for ischemia  . SHOULDER ARTHROSCOPY  4/12   Rt    FAMILY HISTORY: Family History  Problem Relation Age of Onset  .  Hypertension Mother   . Heart failure Mother   . Heart failure Father   . Alzheimer's disease Maternal Grandmother   . Alzheimer's disease Maternal Grandfather     SOCIAL HISTORY: Social History   Social History  . Marital status: Divorced    Spouse name: N/A  . Number of children: 0  . Years of education: college   Occupational History  . Retired    Social History Main Topics  . Smoking status: Never Smoker  . Smokeless tobacco: Never Used  . Alcohol use No  . Drug use: No  . Sexual activity: Not on file   Other Topics Concern  . Not on file   Social History Narrative   Denies caffeine use       PHYSICAL EXAM  Vitals:   09/29/16 1354  Pulse: 78  Resp: (!) 24  Weight: (!) 404 lb 6.4 oz (183.4 kg)  Height: 6' (1.829 m)   Body mass index is 54.85 kg/m.  Generalized: Well developed, in no acute distress   Neurological examination  Mentation: Alert oriented to time, place, history taking. Follows all commands speech and language fluent Cranial nerve II-XII: Pupils were equal round reactive to light. Extraocular movements were full, visual field were full on confrontational test. Facial sensation and strength were normal. Uvula tongue midline. Head turning and shoulder shrug  were normal and symmetric. Neck circumference 20 inches, Mallampati 4+ Motor: The motor testing reveals 5 over 5 strength of all 4 extremities. Good symmetric motor tone is noted throughout.  Sensory: Sensory testing is intact to soft touch on all 4 extremities. No evidence of extinction is noted.   Gait and station: Patient uses a cane when ambulating. Gait is unsteady. Tandem gait not attempted DIAGNOSTIC DATA (LABS, IMAGING, TESTING) - I reviewed patient records, labs, notes, testing and imaging myself where available.  Lab Results  Component Value Date   WBC 7.7 08/30/2013   HGB 16.9 08/30/2013   HCT 47.9 08/30/2013   MCV 86.3 08/30/2013   PLT 295 08/30/2013      Component Value  Date/Time   NA 139 08/30/2013 1104   K 4.7 08/30/2013 1104   CL 102 08/30/2013 1104   CO2 29 08/30/2013 1104   GLUCOSE 90 08/30/2013 1104   BUN 14 08/30/2013 1104   CREATININE 0.85 08/30/2013 1104   CALCIUM 9.4 08/30/2013 1104   PROT 7.2 08/30/2013 1104  ALBUMIN 4.4 08/30/2013 1104   AST 16 08/30/2013 1104   ALT 24 08/30/2013 1104   ALKPHOS 50 08/30/2013 1104   BILITOT 0.4 08/30/2013 1104   GFRNONAA >90 02/27/2012 0811   GFRAA >90 02/27/2012 7412   Lab Results  Component Value Date   CHOL 132 08/30/2013      ASSESSMENT AND PLAN 61 y.o. year old male  has a past medical history of Herniated disc; High cholesterol; Hypertension; Morbid obesity ([place]); PAC (premature atrial contraction); and Sleep apnea. here with:  1.  sleep apnea on CPAP  [name] download indicates that the patient does have a significant leak. He will stop by her sleep like today to have his mask refitted. The patient is also encouraged to use his machine greater than 4 hours each night. Patient also advised that he should have his DME evaluate his machine as it has been turning on spontaneously. The patient will return in 3-4 months for a another download. Advised that if his symptoms worsen or he develops new symptoms he should let us know.  I spent 15 minutes with the patient 50% of this time was spent reviewing his CPAP download.  Ward Givens, MSN, NP-C 09/29/2016, 2:04 PM [facility] [address] [place], [place] [address] [phone]  I reviewed the above note and documentation by the Nurse Practitioner and agree with the history, physical exam, assessment and plan as outlined above. I was immediately available for face-to-face consultation. Star Age, MD, PhD [facility])

## 2016-09-29 NOTE — Telephone Encounter (Signed)
Started in error

## 2016-09-29 NOTE — Patient Instructions (Signed)
Mask refit today If your symptoms worsen or you develop new symptoms please let us know.

## 2016-09-29 NOTE — Telephone Encounter (Signed)
Patient did not show to appt today  

## 2016-09-29 NOTE — Telephone Encounter (Signed)
Pt was fitted with a dreamwear nasal pillow cushion size medium. Pt stated that he tolerates and likes the nasal pillow type cushion. Pt told to let DME company [facility]) know that he needs to be setup on supply call to receive new masks.

## 2016-10-24 ENCOUNTER — Other Ambulatory Visit: Payer: Self-pay | Admitting: Cardiovascular Disease

## 2016-12-01 ENCOUNTER — Telehealth: Payer: Self-pay | Admitting: Physician Assistant

## 2016-12-01 NOTE — Telephone Encounter (Signed)
Received records from [facility] for appointment on 12/11/16 with Rosaria Ferries, PA.  Records put with Rhonda's schedule for 12/11/16. lp

## 2016-12-11 ENCOUNTER — Ambulatory Visit: Payer: Medicare Other | Admitting: Physician Assistant

## 2016-12-19 ENCOUNTER — Ambulatory Visit (INDEPENDENT_AMBULATORY_CARE_PROVIDER_SITE_OTHER): Payer: Medicare Other | Admitting: Physician Assistant

## 2016-12-19 ENCOUNTER — Encounter: Payer: Self-pay | Admitting: Physician Assistant

## 2016-12-19 VITALS — BP 110/66 | HR 60 | Ht 74.0 in | Wt 398.0 lb

## 2016-12-19 DIAGNOSIS — R0602 Shortness of breath: Secondary | ICD-10-CM

## 2016-12-19 DIAGNOSIS — Z01818 Encounter for other preprocedural examination: Secondary | ICD-10-CM

## 2016-12-19 DIAGNOSIS — I10 Essential (primary) hypertension: Secondary | ICD-10-CM | POA: Diagnosis not present

## 2016-12-19 DIAGNOSIS — E785 Hyperlipidemia, unspecified: Secondary | ICD-10-CM | POA: Diagnosis not present

## 2016-12-19 DIAGNOSIS — I5032 Chronic diastolic (congestive) heart failure: Secondary | ICD-10-CM

## 2016-12-19 MED ORDER — LISINOPRIL-HYDROCHLOROTHIAZIDE 20-25 MG PO TABS
1.0000 | ORAL_TABLET | Freq: Every day | ORAL | 3 refills | Status: DC
Start: 2016-12-19 — End: 2018-07-13

## 2016-12-19 MED ORDER — LOVASTATIN 40 MG PO TABS: 40.0000 mg | ORAL_TABLET | Freq: Every day | ORAL | 3 refills | Status: DC

## 2016-12-19 NOTE — Patient Instructions (Addendum)
Medication Instructions:  Continue current medications  Labwork: None Ordered  Testing/Procedures: Your physician has requested that you have a lexiscan myoview. For further information please visit [email or web address]. Please follow instruction sheet, as given.  Follow-Up: Your physician wants you to follow-up in: 6 Months with Dr Sallyanne Kuster. You will receive a reminder letter in the mail two months in advance. If you don't receive a letter, please call our office to schedule the follow-up appointment.   Any Other Special Instructions Will Be Listed Below (If Applicable).  If you need a refill on your cardiac medications before your next appointment, please call your pharmacy.

## 2016-12-19 NOTE — Progress Notes (Signed)
Cardiology Office Note    Date:  12/20/2016   ID:  Brandon Marks, DOB 15-Oct-1955, MRN [id number]  PCP:  System, Provider Not In  Cardiologist:  Dr. Sallyanne Kuster   Chief Complaint  Patient presents with  . Follow-up    seldom has lightheaded and dizziness, no chest pain, pain or cramping in legs, edema, shortness of breath.   . Pre-op Exam    case discussed with Dr. Sallyanne Kuster, patient's primary cardiologist    Brandon Marks was seen today at the request of Dr. Janeth Rase of [facility] for preoperative clearance for potential gastric bypass surgery.    History of Present Illness:  Brandon Marks is a 61 y.o. male with PMH of HTN, chronic diastolic HF, HLD, OSA and morbid obesity. He had normal coronary artery by angiography in 2010 after a false positive nuclear stress test in anticipation of bariatric surgery. He has chronic functional class II exertional dyspnea and a chronic moderately lower extremity edema. His last follow-up with Dr. Sallyanne Kuster was 01/18/2015. Although he does have previous history of noncompliance with CPAP machine, according to a more recent neurology note on 09/29/2016, he has been 100% compliant recently.  He has recently been seen by Dr. Janeth Rase of [facility] and is currently being considered for gastric bypass surgery in order to lose weight. Patient demonstrated eagerness to lose additional weight and become healthy. Otherwise, his mobility is largely limited by knee and back pain. Therefore he was unable to tell me whether or not he would have exertional chest discomfort. He has not noticed any chest pain at rest. I did discuss his history with Dr. Sallyanne Kuster, his primary cardiologist, who recommended a 2 day lexiscan Myoview. Given the history of false positive stress test, I would not be too surprised if his stress test does come back mildly positive again. But as long as the location of abnormality has not  changed, he would be cleared for surgery given the fact that despite the previous abnormality on the stress test, his cardiac catheterization that time showed clean coronaries.    Past Medical History:  Diagnosis Date  . Arthritis   . Herniated disc   . High cholesterol   . Hypertension   . Morbid obesity ([place])   . PAC (premature atrial contraction)   . Sleep apnea    non compliant with c-pap    Past Surgical History:  Procedure Laterality Date  . CARDIAC CATHETERIZATION  02/20/2009   normal coronary arteries  . KNEE ARTHROSCOPY  2010   Lt  . NM MYOCAR PERF WALL MOTION  09/28/2007   small area of reversibility in the anterolateral wall at the apex concerning for ischemia  . SHOULDER ARTHROSCOPY  4/12   Rt    Current Medications: Outpatient Medications Prior to Visit  Medication Sig Dispense Refill  . Multiple Vitamin (MULTIVITAMIN) tablet Take 1 tablet by mouth daily. Off and on    . lisinopril-hydrochlorothiazide (PRINZIDE,ZESTORETIC) 20-25 MG tablet Take 1 tablet by mouth daily. OVERDUE FOR FOLLOW UP. PLEASE CALL AND SCHEDULE [phone] 30 tablet 0  . lovastatin (MEVACOR) 40 MG tablet Take 1 tablet (40 mg total) by mouth at bedtime. OVERDUE FOR FOLLOW UP. PLEASE CALL AND SCHEDULE [phone] 30 tablet 0   No facility-administered medications prior to visit.      Allergies:   Codeine and Oxycodone   Social History   Social History  . Marital status: Divorced    Spouse  name: N/A  . Number of children: 0  . Years of education: college   Occupational History  . Retired    Social History Main Topics  . Smoking status: Never Smoker  . Smokeless tobacco: Never Used  . Alcohol use No  . Drug use: No  . Sexual activity: Not Asked   Other Topics Concern  . None   Social History Narrative   Denies caffeine use      Family History:  [name] family history includes Alzheimer's disease in his maternal grandfather and maternal grandmother; Heart failure in  his father and mother; Hypertension in his mother.   ROS:   Please see the history of present illness.    ROS All other systems reviewed and are negative.   PHYSICAL EXAM:   VS:  BP 110/66   Pulse 60   Ht 6\' 2"  (1.88 m)   Wt (!) 398 lb (180.5 kg)   BMI 51.10 kg/m    GEN: Well nourished, well developed, in no acute distress  HEENT: normal  Neck: no JVD, carotid bruits, or masses Cardiac: RRR; no murmurs, rubs, or gallops,no edema  Respiratory:  clear to auscultation bilaterally, normal work of breathing GI: soft, nontender, nondistended, + BS MS: no deformity or atrophy  Skin: warm and dry, no rash Neuro:  Alert and Oriented x 3, Strength and sensation are intact Psych: euthymic mood, full affect  Wt Readings from Last 3 Encounters:  12/19/16 (!) 398 lb (180.5 kg)  09/29/16 (!) 404 lb 6.4 oz (183.4 kg)  05/15/16 (!) 397 lb (180.1 kg)      Studies/Labs Reviewed:   EKG:  EKG is ordered today.  The ekg ordered today demonstrates Normal sinus rhythm without significant ST-T wave changes.  Recent Labs: No results found for requested labs within last 8760 hours.   Lipid Panel    Component Value Date/Time   CHOL 132 08/30/2013 1104    Additional studies/ records that were reviewed today include:   Cath 02/20/2009  SELECTIVE CORONARY ANGIOGRAPHY:  1. Left main normal.  2. LAD normal.  There was a small vessel that arose the proximal LAD      and went anteriorly.  I question whether this was a coronary      artery/coronary artery fistula, although there was slow flow.  3. Circumflex; free of systemic disease.  4. Right coronary; dominant and free of systemic disease.  5. Left ventriculography; RAO left ventriculogram was performed using      25 mL of Visipaque dye at 12 mL per second.  The overall LVEF was      estimated greater than 60% without focal wall motion abnormalities.   IMPRESSION:  Brandon Marks has normal coronaries and normal left  ventricular function.  I  believe his Myoview was false positive given  his body habitus and his dyspnea is multifactorial, most likely related  to obesity and obstructive sleep apnea.  Regardless, he is at low risk  from a cardiovascular point of view for bariatric surgery and general  anesthesia.  The right radial sheath was removed and a TR hemostasis  band was placed to achieve hemostasis.  The patient left the lab in  stable condition.  He will be discharged home later today as an  outpatient and will see me back in the office in approximately 1 week in  followup.  Dr. Tamsen Roers was notified of these results.    ASSESSMENT:    1. Pre-operative clearance  2. Obesity, morbid ([place])   3. SOB (shortness of breath) on exertion   4. Essential hypertension   5. Hyperlipidemia, unspecified hyperlipidemia type   6. Chronic diastolic heart failure (HCC)      PLAN:  In order of problems listed above:  1. Preoperative Clearance: He denies any recent chest pain, I was unable to obtain a clear picture whether or not he has any exertional type of symptom due to the fact that his functional ability is very limited secondary to back pain and knee pain. I did discuss his case with Dr. Sallyanne Kuster who recommended a 2 day Lexiscan Myoview. We understand that he had a history of falsely positive Myoview in 2010 that led to a cardiac catheterization that showed clean coronaries. As long as the location of the abnormality is consistent with the previous picture, he would be cleared for surgery.   2. Morbid obesity: His weight is close to 400 pound. He demonstrated eagerness to lose weight and become healthy again. I think he would be a good candidate for surgery as I believe he will continue to work toward losing weight.  3. Chronic diastolic heart failure: Euvolemic on physical exam.  4. Hypertension: Blood pressure very well controlled 110/66. Currently on lisinopril-hydrochlorothiazide combo  5. Hyperlipidemia: On  lovastatin    Medication Adjustments/Labs and Tests Ordered: Current medicines are reviewed at length with the patient today.  Concerns regarding medicines are outlined above.  Medication changes, Labs and Tests ordered today are listed in the Patient Instructions below. Patient Instructions  Medication Instructions:  Continue current medications  Labwork: None Ordered  Testing/Procedures: Your physician has requested that you have a lexiscan myoview. For further information please visit [email or web address]. Please follow instruction sheet, as given.  Follow-Up: Your physician wants you to follow-up in: 6 Months with Dr Sallyanne Kuster. You will receive a reminder letter in the mail two months in advance. If you don't receive a letter, please call our office to schedule the follow-up appointment.   Any Other Special Instructions Will Be Listed Below (If Applicable).  If you need a refill on your cardiac medications before your next appointment, please call your pharmacy.      Hilbert Corrigan, [place]  12/20/2016 1:12 PM    [facility] [place], [place], [place]  [address] Phone: [phone]; Fax: [phone]

## 2016-12-20 ENCOUNTER — Encounter: Payer: Self-pay | Admitting: Physician Assistant

## 2016-12-25 ENCOUNTER — Telehealth (HOSPITAL_COMMUNITY): Payer: Self-pay

## 2016-12-25 NOTE — Telephone Encounter (Signed)
Encounter complete. 

## 2016-12-30 ENCOUNTER — Inpatient Hospital Stay (HOSPITAL_COMMUNITY): Admission: RE | Admit: 2016-12-30 | Payer: Medicare Other | Source: Ambulatory Visit

## 2016-12-30 ENCOUNTER — Ambulatory Visit (HOSPITAL_COMMUNITY): Payer: Medicare Other | Attending: Cardiovascular Disease

## 2016-12-30 DIAGNOSIS — I10 Essential (primary) hypertension: Secondary | ICD-10-CM | POA: Insufficient documentation

## 2016-12-30 DIAGNOSIS — Z01818 Encounter for other preprocedural examination: Secondary | ICD-10-CM | POA: Diagnosis not present

## 2016-12-30 DIAGNOSIS — R9439 Abnormal result of other cardiovascular function study: Secondary | ICD-10-CM | POA: Insufficient documentation

## 2016-12-30 DIAGNOSIS — Z6841 Body Mass Index (BMI) 40.0 and over, adult: Secondary | ICD-10-CM | POA: Diagnosis not present

## 2016-12-30 DIAGNOSIS — R0602 Shortness of breath: Secondary | ICD-10-CM

## 2016-12-30 MED ORDER — REGADENOSON 0.4 MG/5ML IV SOLN
0.4000 mg | Freq: Once | INTRAVENOUS | Status: AC
  Administered 2016-12-30: 0.4 mg via INTRAVENOUS

## 2016-12-30 MED ORDER — TECHNETIUM TC 99M TETROFOSMIN IV KIT
33.0000 | PACK | Freq: Once | INTRAVENOUS | Status: AC | PRN
  Administered 2016-12-30: 33 via INTRAVENOUS
  Filled 2016-12-30: qty 33

## 2016-12-31 ENCOUNTER — Inpatient Hospital Stay (HOSPITAL_COMMUNITY): Admission: RE | Admit: 2016-12-31 | Payer: Medicare Other | Source: Ambulatory Visit

## 2016-12-31 ENCOUNTER — Ambulatory Visit (HOSPITAL_COMMUNITY): Payer: Medicare Other | Attending: Cardiology

## 2016-12-31 LAB — MYOCARDIAL PERFUSION IMAGING
CHL CUP NUCLEAR SDS: 2
CHL CUP RESTING HR STRESS: 66 {beats}/min
LHR: 0.32
LV dias vol: 137 mL (ref 62–150)
LV sys vol: 53 mL
NUC STRESS TID: 0.96
Peak HR: 90 {beats}/min
SRS: 11
SSS: 13

## 2016-12-31 MED ORDER — TECHNETIUM TC 99M TETROFOSMIN IV KIT
32.5000 | PACK | Freq: Once | INTRAVENOUS | Status: AC | PRN
  Administered 2016-12-31: 32.5 via INTRAVENOUS
  Filled 2016-12-31: qty 33

## 2017-01-01 ENCOUNTER — Encounter: Payer: Self-pay | Admitting: Physician Assistant

## 2017-02-04 ENCOUNTER — Ambulatory Visit: Payer: Medicare Other | Admitting: Adult Health

## 2017-03-29 ENCOUNTER — Encounter (HOSPITAL_COMMUNITY): Payer: Self-pay | Admitting: Emergency Medicine

## 2017-03-29 ENCOUNTER — Telehealth (HOSPITAL_COMMUNITY): Payer: Self-pay | Admitting: *Deleted

## 2017-03-29 ENCOUNTER — Ambulatory Visit (HOSPITAL_COMMUNITY)
Admission: EM | Admit: 2017-03-29 | Discharge: 2017-03-29 | Disposition: A | Discharge status: Home / Self Care | Payer: Medicare Other | Attending: Emergency Medicine | Admitting: Emergency Medicine

## 2017-03-29 DIAGNOSIS — R300 Dysuria: Secondary | ICD-10-CM

## 2017-03-29 DIAGNOSIS — N3000 Acute cystitis without hematuria: Secondary | ICD-10-CM

## 2017-03-29 LAB — POCT URINALYSIS DIP (DEVICE)
GLUCOSE, UA: NEGATIVE mg/dL
Hgb urine dipstick: NEGATIVE
KETONES UR: NEGATIVE mg/dL
NITRITE: NEGATIVE
PH: 5.5 (ref 5.0–8.0)
Protein, ur: NEGATIVE mg/dL
Specific Gravity, Urine: >=1.03 (ref 1.005–1.030)
Urobilinogen, UA: 0.2 mg/dL (ref 0.0–1.0)

## 2017-03-29 MED ORDER — CEPHALEXIN 500 MG PO CAPS: 500.0000 mg | ORAL_CAPSULE | Freq: Four times a day (QID) | ORAL | 0 refills | Status: DC

## 2017-03-29 MED ORDER — PHENAZOPYRIDINE HCL 200 MG PO TABS: 200.0000 mg | ORAL_TABLET | Freq: Three times a day (TID) | ORAL | 0 refills | Status: DC | PRN

## 2017-03-29 MED ORDER — CEPHALEXIN 500 MG PO CAPS
500.0000 mg | ORAL_CAPSULE | Freq: Four times a day (QID) | ORAL | 0 refills | Status: DC
Start: 2017-03-29 — End: 2017-03-29

## 2017-03-29 NOTE — Discharge Instructions (Signed)
You are being treated today for a urinary tract infection. I have prescribed Keflex, take 1 tablet 4 times a day for 5 days. I have also prescribed Pyridium. Take 1 tablet a day 3 times a day for 2 days. Your urine will be sent for culture and you will be notified should any change in therapy be needed. Drink plenty of fluids and rest. Should your symptoms fail to resolve, follow up with your primary care provider or return to clinic.  °

## 2017-03-29 NOTE — ED Triage Notes (Signed)
Pt here for dysuria onset 3 days associated w/incontinence, fevers, chills  Denies hematuria, n/v, abd/bac pain  A&O x4... NAD... Brought back on wheel chair.

## 2017-03-29 NOTE — ED Provider Notes (Signed)
[facility]   [id number] 03/29/17 Arrival Time: 1505   SUBJECTIVE:  Brandon Marks is a 61 y.o. male who presents to the urgent care with complaint of dysuria, frequency, along with urgency, fever, and chills. He is a long-haul, over the road trucker. The symptoms of the ongoing for 3 days, denies possibility of STDs, stating it is been almost one year since last intercourse. He has no penile discharge. No nausea or vomiting, or other symptoms.     Past Medical History:  Diagnosis Date  . Arthritis   . Herniated disc   . High cholesterol   . Hypertension   . Morbid obesity ([place])   . PAC (premature atrial contraction)   . Sleep apnea    non compliant with c-pap   Social History   Social History  . Marital status: Divorced    Spouse name: N/A  . Number of children: 0  . Years of education: college   Occupational History  . Retired    Social History Main Topics  . Smoking status: Never Smoker  . Smokeless tobacco: Never Used  . Alcohol use No  . Drug use: No  . Sexual activity: Not on file   Other Topics Concern  . Not on file   Social History Narrative   Denies caffeine use    Current Meds  Medication Sig  . lisinopril-hydrochlorothiazide (PRINZIDE,ZESTORETIC) 20-25 MG tablet Take 1 tablet by mouth daily.  Marland Kitchen lovastatin (MEVACOR) 40 MG tablet Take 1 tablet (40 mg total) by mouth at bedtime.   Allergies  Allergen Reactions  . Codeine   . Oxycodone Other (See Comments)    Upset GI      ROS: As per HPI, remainder of ROS negative.   OBJECTIVE:  Vitals:   03/29/17 1629  BP: 98/68  Pulse: 85  Resp: 20  Temp: 97.6 F (36.4 C)  TempSrc: Oral  SpO2: 97%       General Appearance:  awake, alert, oriented, in no acute distress, well developed, well nourished and in no acute distress Skin:  skin color, texture, turgor are normal Ears:  External- bilateral-  normal Back:  no pain to palpation, no CVA tenderness Lungs:  Normal expansion.   Clear to auscultation.  No rales, rhonchi, or wheezing. Heart:  Heart regular rate and rhythm Abdomen:  Soft, nontender Peripheral Pulses:  Capillary refill <2secs, strong peripheral pulses Neurologic:  Alert and oriented     Labs:  Results for orders placed or performed during the hospital encounter of 03/29/17  POCT urinalysis dip (device)  Result Value Ref Range   Glucose, UA NEGATIVE NEGATIVE mg/dL   Bilirubin Urine SMALL (A) NEGATIVE   Ketones, ur NEGATIVE NEGATIVE mg/dL   Specific Gravity, Urine >=1.030 1.005 - 1.030   Hgb urine dipstick NEGATIVE NEGATIVE   pH 5.5 5.0 - 8.0   Protein, ur NEGATIVE NEGATIVE mg/dL   Urobilinogen, UA 0.2 0.0 - 1.0 mg/dL   Nitrite NEGATIVE NEGATIVE   Leukocytes, UA TRACE (A) NEGATIVE    Labs Reviewed  POCT URINALYSIS DIP (DEVICE) - Abnormal; Notable for the following:       Result Value   Bilirubin Urine SMALL (*)    Leukocytes, UA TRACE (*)    All other components within normal limits  URINE CULTURE    No results found.     ASSESSMENT & PLAN:  1. Acute cystitis without hematuria     Meds ordered this encounter  Medications  . cephALEXin (KEFLEX) 500  MG capsule    Sig: Take 1 capsule (500 mg total) by mouth 4 (four) times daily.    Dispense:  20 capsule    Refill:  0    Order Specific Question:   Supervising Provider    Answer:   Shelda Pal [[id number]]  . phenazopyridine (PYRIDIUM) 200 MG tablet    Sig: Take 1 tablet (200 mg total) by mouth 3 (three) times daily as needed for pain.    Dispense:  10 tablet    Refill:  0    Order Specific Question:   Supervising Provider    Answer:   Shelda Pal [[id number]]   If symptoms persist, follow-up with primary care  Reviewed expectations re: course of current medical issues. Questions answered. Outlined signs and symptoms indicating need for more acute intervention. Patient verbalized understanding. After Visit Summary given.    Procedures:          Barnet Glasgow, NP 03/29/17 1715

## 2017-04-30 ENCOUNTER — Ambulatory Visit: Payer: Medicare Other | Admitting: Adult Health

## 2017-05-21 ENCOUNTER — Emergency Department (HOSPITAL_COMMUNITY)
Admission: EM | Admit: 2017-05-21 | Discharge: 2017-05-21 | Disposition: A | Discharge status: Home / Self Care | Payer: Medicare Other | Attending: Emergency Medicine | Admitting: Emergency Medicine

## 2017-05-21 ENCOUNTER — Emergency Department (HOSPITAL_COMMUNITY): Payer: Medicare Other

## 2017-05-21 ENCOUNTER — Encounter (HOSPITAL_COMMUNITY): Payer: Self-pay

## 2017-05-21 DIAGNOSIS — I10 Essential (primary) hypertension: Secondary | ICD-10-CM | POA: Insufficient documentation

## 2017-05-21 DIAGNOSIS — E785 Hyperlipidemia, unspecified: Secondary | ICD-10-CM | POA: Diagnosis not present

## 2017-05-21 DIAGNOSIS — Z79899 Other long term (current) drug therapy: Secondary | ICD-10-CM | POA: Insufficient documentation

## 2017-05-21 DIAGNOSIS — N50811 Right testicular pain: Secondary | ICD-10-CM

## 2017-05-21 DIAGNOSIS — N442 Benign cyst of testis: Secondary | ICD-10-CM | POA: Diagnosis not present

## 2017-05-21 DIAGNOSIS — R609 Edema, unspecified: Secondary | ICD-10-CM

## 2017-05-21 DIAGNOSIS — E7800 Pure hypercholesterolemia, unspecified: Secondary | ICD-10-CM | POA: Insufficient documentation

## 2017-05-21 LAB — URINALYSIS, ROUTINE W REFLEX MICROSCOPIC
BILIRUBIN URINE: NEGATIVE
Glucose, UA: NEGATIVE mg/dL
Ketones, ur: NEGATIVE mg/dL
NITRITE: POSITIVE — AB
PROTEIN: 30 mg/dL — AB
SPECIFIC GRAVITY, URINE: 1.014 (ref 1.005–1.030)
SQUAMOUS EPITHELIAL / LPF: NONE SEEN
pH: 6 (ref 5.0–8.0)

## 2017-05-21 LAB — BASIC METABOLIC PANEL
ANION GAP: 9 (ref 5–15)
BUN: 13 mg/dL (ref 6–20)
CHLORIDE: 100 mmol/L — AB (ref 101–111)
CO2: 29 mmol/L (ref 22–32)
Calcium: 9.1 mg/dL (ref 8.9–10.3)
Creatinine, Ser: 1.02 mg/dL (ref 0.61–1.24)
GFR calc Af Amer: >60 mL/min (ref >60)
GFR calc non Af Amer: >60 mL/min (ref >60)
Glucose, Bld: 98 mg/dL (ref 65–99)
POTASSIUM: 3.8 mmol/L (ref 3.5–5.1)
SODIUM: 138 mmol/L (ref 135–145)

## 2017-05-21 LAB — CBC WITH DIFFERENTIAL/PLATELET
BASOS ABS: 0 10*3/uL (ref 0.0–0.1)
Basophils Relative: 0 %
EOS ABS: 0 10*3/uL (ref 0.0–0.7)
EOS PCT: 0 %
HCT: 43 % (ref 39.0–52.0)
HEMOGLOBIN: 14.9 g/dL (ref 13.0–17.0)
LYMPHS PCT: 17 %
Lymphs Abs: 2.6 10*3/uL (ref 0.7–4.0)
MCH: 30.7 pg (ref 26.0–34.0)
MCHC: 34.7 g/dL (ref 30.0–36.0)
MCV: 88.7 fL (ref 78.0–100.0)
Monocytes Absolute: 1 10*3/uL (ref 0.1–1.0)
Monocytes Relative: 7 %
NEUTROS PCT: 76 %
Neutro Abs: 11.2 10*3/uL — ABNORMAL HIGH (ref 1.7–7.7)
PLATELETS: 235 10*3/uL (ref 150–400)
RBC: 4.85 MIL/uL (ref 4.22–5.81)
RDW: 14.7 % (ref 11.5–15.5)
WBC: 14.9 10*3/uL — AB (ref 4.0–10.5)

## 2017-05-21 MED ORDER — IBUPROFEN 800 MG PO TABS: 800.0000 mg | ORAL_TABLET | Freq: Three times a day (TID) | ORAL | 0 refills | Status: DC

## 2017-05-21 MED ORDER — LEVOFLOXACIN IN D5W 500 MG/100ML IV SOLN
500.0000 mg | Freq: Once | INTRAVENOUS | Status: AC
  Administered 2017-05-21: 500 mg via INTRAVENOUS
  Filled 2017-05-21: qty 100

## 2017-05-21 MED ORDER — KETOROLAC TROMETHAMINE 30 MG/ML IJ SOLN
30.0000 mg | Freq: Once | INTRAMUSCULAR | Status: AC
  Administered 2017-05-21: 30 mg via INTRAVENOUS
  Filled 2017-05-21: qty 1

## 2017-05-21 MED ORDER — LEVOFLOXACIN 750 MG PO TABS: 750.0000 mg | ORAL_TABLET | Freq: Every day | ORAL | 0 refills | Status: DC

## 2017-05-21 NOTE — Discharge Instructions (Signed)
Please start taking Levaquin once a day for the next 7 days, ibuprofen for pain, please call the office of Dr. Alyson Ingles for follow-up with a urologist.  It does not need to be with Dr. Alyson Ingles.  If you cannot be seen tomorrow please try to arrange for Monday follow-up.  If you should develop increasing pain fever or vomiting return to the emergency department immediately.

## 2017-05-21 NOTE — ED Notes (Signed)
Patient transported to Ultrasound 

## 2017-05-21 NOTE — ED Provider Notes (Signed)
[facility] EMERGENCY DEPARTMENT Provider Note   CSN: [id number] Arrival date & time: 05/21/17  1056    History   Chief Complaint No chief complaint on file.   HPI Brandon Marks is a 61 y.o. male.  HPI  61 y/o male - has hx of ? UTI in July of 2018, was treated with doxy but states that he has had recurrent pain in the R testicle and lower back since last night.    The patient is a not a diabetic, he has no fevers or chills, he has not nauseated, he has no abdominal discomfort but has pain in the right groin.  He does have an associated dysuria.  He has no hx of Kidney stones or testicular / scrotal problems. In the past - denies STD's.  Sx are constant, signficantly worsened since last night.  Past Medical History:  Diagnosis Date  . Arthritis   . Herniated disc   . High cholesterol   . Hypertension   . Morbid obesity ([place])   . PAC (premature atrial contraction)   . Sleep apnea    non compliant with c-pap    Patient Active Problem List   Diagnosis Date Noted  . Super obesity 01/20/2015  . High cholesterol 01/20/2015  . Chest pain 02/27/2012  . Obesity, morbid ([place]) 02/27/2012  . Dyspnea, chronic DOE 02/27/2012  . Herniated disc   . HYPERLIPIDEMIA 02/23/2009  . 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 Essential hypertension 10/18/2007  . Obstructive sleep apnea 10/11/2007    Past Surgical History:  Procedure Laterality Date  . CARDIAC CATHETERIZATION  02/20/2009   normal coronary arteries  . KNEE ARTHROSCOPY  2010   Lt  . NM MYOCAR PERF WALL MOTION  09/28/2007   small area of reversibility in the anterolateral wall at the apex concerning for ischemia  . SHOULDER ARTHROSCOPY  4/12   Rt       Home Medications    Prior to Admission medications   Medication Sig Start Date End Date Taking? Authorizing Provider  doxycycline (VIBRAMYCIN) 100 MG capsule Take 100 mg by mouth 2 (two) times daily. Pt took 1 dose   Yes [provider]  lisinopril-hydrochlorothiazide (PRINZIDE,ZESTORETIC) 20-25 MG tablet Take 1 tablet by mouth daily. 12/19/16  Yes Almyra Deforest, PA  lovastatin (MEVACOR) 40 MG tablet Take 1 tablet (40 mg total) by mouth at bedtime. 12/19/16  Yes Almyra Deforest, PA  Multiple Vitamin (MULTIVITAMIN) tablet Take 1 tablet by mouth daily. Off and on   Yes [provider]  phenazopyridine (PYRIDIUM) 200 MG tablet Take 1 tablet (200 mg total) by mouth 3 (three) times daily as needed for pain. 03/29/17  Yes Barnet Glasgow, NP  cephALEXin (KEFLEX) 500 MG capsule Take 1 capsule (500 mg total) by mouth 4 (four) times daily. Patient not taking: Reported on 05/21/2017 03/29/17   Barnet Glasgow, NP  ibuprofen (ADVIL,MOTRIN) 800 MG tablet Take 1 tablet (800 mg total) by mouth 3 (three) times daily. 05/21/17   Noemi Chapel, MD  levofloxacin (LEVAQUIN) 750 MG tablet Take 1 tablet (750 mg total) by mouth daily. 05/21/17   Noemi Chapel, MD    Family History Family History  Problem Relation Age of Onset  . Hypertension Mother   . Heart failure Mother   . Heart failure Father   . Alzheimer's disease Maternal Grandmother   . Alzheimer's disease Maternal Grandfather     Social History Social History  Substance Use Topics  . Smoking  status: Never Smoker  . Smokeless tobacco: Never Used  . Alcohol use No     Allergies   Codeine and Oxycodone   Review of Systems Review of Systems  All other systems reviewed and are negative.    Physical Exam Updated Vital Signs BP (!) 116/50   Pulse 73   Temp 99 F (37.2 C) (Oral)   Resp 18   SpO2 96%   Physical Exam  Constitutional: He appears well-developed and well-nourished. No distress.  HENT:  Head: Normocephalic and atraumatic.  Mouth/Throat: Oropharynx is clear and moist. No oropharyngeal exudate.  Eyes: Pupils are equal, round, and reactive to light. Conjunctivae and EOM are normal. Right eye exhibits no discharge. Left eye exhibits no discharge.  No scleral icterus.  Neck: Normal range of motion. Neck supple. No JVD present. No thyromegaly present.  Cardiovascular: Normal rate, regular rhythm, normal heart sounds and intact distal pulses.  Exam reveals no gallop and no friction rub.   No murmur heard. Pulmonary/Chest: Effort normal and breath sounds normal. No respiratory distress. He has no wheezes. He has no rales.  Abdominal: Soft. Bowel sounds are normal. He exhibits no distension and no mass. There is no tenderness.  Genitourinary:  Genitourinary Comments: Normal-appearing penis scrotum and testicles except for the right hemiscrotum which is slightly red, the testicle was enlarged and tender, no lymphadenopathy in the right groin, no penile discharge at the urethral meatus, foreskin is easily reduced  Musculoskeletal: Normal range of motion. He exhibits no edema or tenderness.  Lymphadenopathy:    He has no cervical adenopathy.  Neurological: He is alert. Coordination normal.  Skin: Skin is warm and dry. No rash noted. No erythema.  Psychiatric: He has a normal mood and affect. His behavior is normal.  Nursing note and vitals reviewed.    ED Treatments / Results  Labs (all labs ordered are listed, but only abnormal results are displayed) Labs Reviewed  URINALYSIS, ROUTINE W REFLEX MICROSCOPIC - Abnormal; Notable for the following:       Result Value   Color, Urine AMBER (*)    Hgb urine dipstick MODERATE (*)    Protein, ur 30 (*)    Nitrite POSITIVE (*)    Leukocytes, UA SMALL (*)    Bacteria, UA RARE (*)    All other components within normal limits  CBC WITH DIFFERENTIAL/PLATELET - Abnormal; Notable for the following:    WBC 14.9 (*)    Neutro Abs 11.2 (*)    All other components within normal limits  BASIC METABOLIC PANEL - Abnormal; Notable for the following:    Chloride 100 (*)    All other components within normal limits  GC/CHLAMYDIA PROBE AMP ([place]) NOT AT [facility]    EKG  EKG Interpretation None        Radiology US Scrotum  Result Date: 05/21/2017 CLINICAL DATA:  Swelling of the right scrotum. EXAM: SCROTAL ULTRASOUND DOPPLER ULTRASOUND OF THE TESTICLES TECHNIQUE: Complete ultrasound examination of the testicles, epididymis, and other scrotal structures was performed. Color and spectral Doppler ultrasound were also utilized to evaluate blood flow to the testicles. COMPARISON:  None. FINDINGS: Right testicle Measurements: 5.3 x 2.9 x 3.3 cm. No mass or microlithiasis visualized. Left testicle Measurements: 5.6 x 3.6 x 3.4 cm. No mass or microlithiasis visualized. Right epididymis: There is a complex cyst at the tail of the right epididymis measures 3.4 x 2.6 x 2.9 cm. Left epididymis:  Normal in size and appearance. Hydrocele:  There is a  right hydrocele. Varicocele:  None visualized. Pulsed Doppler interrogation of both testes demonstrates normal low resistance arterial and venous waveforms bilaterally. IMPRESSION: Normal bilateral testis. Complex cyst at the tail the right epididymis measuring 3.4 x 2.6 x 2.9 cm. Right hydrocele. Electronically Signed   By: Abelardo Diesel M.D.   On: 05/21/2017 16:13   [place] Scrotom Doppler  Result Date: 05/21/2017 CLINICAL DATA:  Swelling of the right scrotum. EXAM: SCROTAL ULTRASOUND DOPPLER ULTRASOUND OF THE TESTICLES TECHNIQUE: Complete ultrasound examination of the testicles, epididymis, and other scrotal structures was performed. Color and spectral Doppler ultrasound were also utilized to evaluate blood flow to the testicles. COMPARISON:  None. FINDINGS: Right testicle Measurements: 5.3 x 2.9 x 3.3 cm. No mass or microlithiasis visualized. Left testicle Measurements: 5.6 x 3.6 x 3.4 cm. No mass or microlithiasis visualized. Right epididymis: There is a complex cyst at the tail of the right epididymis measures 3.4 x 2.6 x 2.9 cm. Left epididymis:  Normal in size and appearance. Hydrocele:  There is a right hydrocele. Varicocele:  None visualized. Pulsed Doppler  interrogation of both testes demonstrates normal low resistance arterial and venous waveforms bilaterally. IMPRESSION: Normal bilateral testis. Complex cyst at the tail the right epididymis measuring 3.4 x 2.6 x 2.9 cm. Right hydrocele. Electronically Signed   By: Abelardo Diesel M.D.   On: 05/21/2017 16:13    Procedures Procedures (including critical care time)  Medications Ordered in ED Medications  ketorolac (TORADOL) 30 MG/ML injection 30 mg (30 mg Intravenous Given 05/21/17 1650)  levofloxacin (LEVAQUIN) IVPB 500 mg (0 mg Intravenous Stopped 05/21/17 1839)     Initial Impression / Assessment and Plan / ED Course  I have reviewed the triage vital signs and the nursing notes.  Pertinent labs & imaging results that were available during my care of the patient were reviewed by me and considered in my medical decision making (see chart for details).     Stat ultrasound ordered to rule out torsion, consider epididymal orchitis as a likely answer.  Patient will be given pain medicine, IV antibiotics, urinalysis reveals too numerous to count white and red blood cells with nitrite positive.  Discussed with Dr. Alyson Ingles, he is aware of the findings and agrees with antibiotics for what could be a abscess or a cyst.  The patient is aware of this as well, there is a leukocytosis of 14,000, no renal dysfunction, urinary culture has been ordered, ultrasound shows complex cyst which could be an abscess.  Stable for discharge on Levaquin, patient expressed understanding.  No hypotension.  Final Clinical Impressions(s) / ED Diagnoses   Final diagnoses:  Testicular pain, right  Testicular cyst    New Prescriptions New Prescriptions   IBUPROFEN (ADVIL,MOTRIN) 800 MG TABLET    Take 1 tablet (800 mg total) by mouth 3 (three) times daily.   LEVOFLOXACIN (LEVAQUIN) 750 MG TABLET    Take 1 tablet (750 mg total) by mouth daily.     Noemi Chapel, MD 05/21/17 [phone]

## 2017-05-21 NOTE — ED Triage Notes (Signed)
Patient complains of right flank pain, right groin pain and right testicle swelling x 2 days, had recent UTI. Took last dose of keflex this am

## 2017-05-22 LAB — GC/CHLAMYDIA PROBE AMP (~~LOC~~) NOT AT ARMC
Chlamydia: NEGATIVE
Neisseria Gonorrhea: NEGATIVE

## 2017-09-04 ENCOUNTER — Telehealth: Payer: Self-pay | Admitting: *Deleted

## 2017-09-04 DIAGNOSIS — M5030 Other cervical disc degeneration, unspecified cervical region: Secondary | ICD-10-CM | POA: Insufficient documentation

## 2017-09-04 NOTE — Telephone Encounter (Signed)
   [facility] Pre-operative Risk Assessment    Request for surgical clearance:  1. What type of surgery is being performed? WEIGHT LOSS SURGERY   2. When is this surgery scheduled? PENDING   3. What type of clearance is required (medical clearance vs. Pharmacy clearance to hold med vs. Both)? MEDICAL  4. Are there any medications that need to be held prior to surgery and how long?NONE   5. Practice name and name of physician performing surgery? NOT LISTED   6. What is your office phone and fax number? PH=[phone]  FAX=[phone]   7. Anesthesia type (None, local, MAC, general) ? UNKNOWN   Brandon Marks 09/04/2017, 5:38 PM  _________________________________________________________________   (provider comments below)

## 2017-09-07 NOTE — Telephone Encounter (Signed)
Follow up       [facility] Pre-operative Risk Assessment    Request for surgical clearance:  1. What type of surgery is being performed? Gastric Bypass 2. When is this surgery scheduled? 09/14/2017   3. What type of clearance is required (medical clearance vs. Pharmacy clearance to hold med vs. Both)? Medical   4. Are there any medications that need to be held prior to surgery and how long?none  5. Practice name and name of physician performing surgery? [facility]  What is your office phone and fax number? [phone]( office) [phone] (fax) 6. Anesthesia type (None, local, MAC, general) ? unknow    Brandon Marks 09/07/2017, 8:33 AM  _________________________________________________________________   (provider comments below)

## 2017-09-07 NOTE — Telephone Encounter (Signed)
   Primary Cardiologist:Mihai Croitoru, MD  Chart reviewed as part of pre-operative protocol coverage. Because of Bradyn Soward Longton's past medical history and time since last visit, he/she will require a follow-up visit in order to better assess preoperative cardiovascular risk.  Pre-op covering staff: - Please schedule appointment and call patient to inform them. - Please contact requesting surgeon's office via preferred method (i.e, phone, fax) to inform them of need for appointment prior to surgery.  Lyda Jester, PA-C  09/07/2017, 3:01 PM

## 2017-09-07 NOTE — Telephone Encounter (Signed)
Spoke with patient and let him know he would need to be seen prior to being cleared for surgery. Scheduled patient w/ Richardson Dopp [facility] for Wednesday 2/13 at 3:45. Gave patient address and patient verbalized understanding.   Spoke w/ Shirlean Mylar at requesting office to let her know patient will require a visit prior to being cleared for surgery and gave her date and time of appt.

## 2017-09-08 ENCOUNTER — Telehealth: Payer: Self-pay | Admitting: Cardiovascular Disease

## 2017-09-08 NOTE — Telephone Encounter (Signed)
He his scheduled for office visit with Richardson Dopp, PA, on 2/

## 2017-09-08 NOTE — Telephone Encounter (Signed)
F/U Call:  Brandon Marks calling from [facility], states that they will also need "level of cardiac clearance" as well.    Request for surgical clearance:  1. What type of surgery is being performed? Gastric Bypass 2. When is this surgery scheduled? 09/14/2017        3. What type of clearance is required (medical clearance vs. Pharmacy clearance to hold med vs. Both)? Medical   4. Are there any medications that need to be held prior to surgery and how long?none  5. Practice name and name of physician performing surgery? [place]  What is your office phone and fax number? [phone]( office) [phone] (fax) 6. Anesthesia type (None, local, MAC, general) ? unknow

## 2017-09-09 ENCOUNTER — Ambulatory Visit (INDEPENDENT_AMBULATORY_CARE_PROVIDER_SITE_OTHER): Payer: Medicare Other | Admitting: Physician Assistant

## 2017-09-09 ENCOUNTER — Encounter: Payer: Self-pay | Admitting: Physician Assistant

## 2017-09-09 VITALS — BP 130/62 | HR 84 | Ht 74.0 in | Wt 380.1 lb

## 2017-09-09 DIAGNOSIS — I10 Essential (primary) hypertension: Secondary | ICD-10-CM

## 2017-09-09 DIAGNOSIS — I5032 Chronic diastolic (congestive) heart failure: Secondary | ICD-10-CM | POA: Diagnosis not present

## 2017-09-09 DIAGNOSIS — Z01810 Encounter for preprocedural cardiovascular examination: Secondary | ICD-10-CM

## 2017-09-09 NOTE — Telephone Encounter (Signed)
Leave message on Dr Toney Rakes pre op voicemail to check and see if office note was received and to give our office a call back

## 2017-09-09 NOTE — Patient Instructions (Signed)
Medication Instructions:  Your physician recommends that you continue on your current medications as directed. Please refer to the Current Medication list given to you today.   Labwork: None ordered  Testing/Procedures: None ordered  Follow-Up: Your physician wants you to follow-up in: [place] will receive a reminder letter in the mail two months in advance. If you don't receive a letter, please call our office to schedule the follow-up appointment.   Any Other Special Instructions Will Be Listed Below (If Applicable).     If you need a refill on your cardiac medications before your next appointment, please call your pharmacy.

## 2017-09-09 NOTE — Telephone Encounter (Signed)
Please contact surgeon's office (Dr. Eldridge Abrahams) at [facility] to make sure my note was received from today. Richardson Dopp, PA-C    09/09/2017 4:40 PM

## 2017-09-09 NOTE — Progress Notes (Signed)
Cardiology Office Note:    Date:  09/09/2017   ID:  Brandon Marks, DOB [birth year], MRN [id number]  PCP:  Brandon Roers, MD  Cardiologist:  Brandon Klein, MD   Referring MD: Brandon Roers, MD   Chief Complaint  Patient presents with  . Surgical Clearance    History of Present Illness:    Brandon Marks is a 62 y.o. male with a hx of diastolic heart failure, hypertension, hyperlipidemia, morbid obesity, sleep apnea.  Cardiac catheterization 2010 demonstrated normal coronary arteries.  Stress testing in June 2018 was low risk and negative for ischemia.  Last seen by Brandon Deforest, PA-C in 11/2016 for preoperative evaluation prior to gastric bypass surgery.     Brandon Marks returns for surgical clearance.  He is here alone.  He never had the gastric bypass.  It is scheduled for next week at [facility] with Dr. Eldridge Marks.  He has been doing well since last seen.  He denies chest pain, significant worsening in his shortness of breath or syncope.  He denies paroxysmal nocturnal dyspnea or worsening edema.  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  But is able to lift heavy objects and do housework like vacuuming without symptoms to suggest angina.    Prior CV studies:   The following studies were reviewed today:  Nuclear stress test 12/31/16 EF 60 to, fixed anteroseptal and apical septal defect (suspect attenuation), no ischemia, low risk  Cardiac catheterization 02/20/2009 Normal coronary arteries EF 60  Past Medical History:  Diagnosis Date  . Arthritis   . Herniated disc   . High cholesterol   . Hypertension   . Morbid obesity ([place])   . PAC (premature atrial contraction)   . Sleep apnea    non compliant with c-pap    Past Surgical History:  Procedure Laterality Date  . CARDIAC CATHETERIZATION  02/20/2009   normal coronary arteries  . KNEE ARTHROSCOPY  2010   Lt  . NM MYOCAR PERF WALL MOTION  09/28/2007   small area of reversibility in the anterolateral wall at the apex concerning  for ischemia  . SHOULDER ARTHROSCOPY  4/12   Rt    Current Medications: Current Meds  Medication Sig  . lisinopril-hydrochlorothiazide (PRINZIDE,ZESTORETIC) 20-25 MG tablet Take 1 tablet by mouth daily.  Marland Kitchen lovastatin (MEVACOR) 40 MG tablet Take 1 tablet (40 mg total) by mouth at bedtime.     Allergies:   Codeine and Oxycodone   Social History   Tobacco Use  . Smoking status: Never Smoker  . Smokeless tobacco: Never Used  Substance Use Topics  . Alcohol use: No  . Drug use: No     Family Hx: [name] family history includes Alzheimer's disease in his maternal grandfather and maternal grandmother; Heart failure in his father and mother; Hypertension in his mother.  ROS:   Please see the history of present illness.    ROS All other systems reviewed and are negative.   EKGs/Labs/Other Test Reviewed:    EKG:  EKG is  ordered today.  The ekg ordered today demonstrates normal sinus rhythm, heart rate 80, rightward axis, nonspecific ST-T wave changes, QTC 447 ms, similar to prior tracing  Recent Labs: 05/21/2017: BUN 13; Creatinine, Ser 1.02; Hemoglobin 14.9; Platelets 235; Potassium 3.8; Sodium 138   Recent Lipid Panel Lab Results  Component Value Date/Time   CHOL 132 08/30/2013 11:04 AM    Physical Exam:    VS:  BP 130/62   Pulse 84  Ht 6\' 2"  (1.88 m)   Wt (!) 380 lb 1.9 oz (172.4 kg)   SpO2 95%   BMI 48.80 kg/m     Wt Readings from Last 3 Encounters:  09/09/17 (!) 380 lb 1.9 oz (172.4 kg)  12/30/16 (!) 398 lb (180.5 kg)  12/19/16 (!) 398 lb (180.5 kg)     Physical Exam  Constitutional: He is oriented to person, place, and time. He appears well-developed and well-nourished. No distress.  HENT:  Head: Normocephalic and atraumatic.  Eyes: No scleral icterus.  Neck: Neck supple.  Cardiovascular: Normal rate and regular rhythm.  No murmur heard. Pulmonary/Chest: Effort normal. He has no rales.  Abdominal: Soft.  Musculoskeletal: He exhibits edema (1+  bilat edema).  Neurological: He is alert and oriented to person, place, and time.  Skin: Skin is warm and dry.    ASSESSMENT & PLAN:    1.  Preoperative cardiovascular examination He needs gastric [facility] at [facility].  He had a low risk Nuclear stress test in 12/2016 and normal coronary arteries by Cardiac Catheterization in 2010.  The Revised Cardiac Risk Index indicates that his Perioperative Risk of Major Cardiac Event is (%): 6.6.  Therefore, he is at high risk for perioperative complications.  His Functional Capacity in METs is: 5.62 (good) as indicated by the Duke Activity Status Index (DASI).  Therefore, According to ACC/AHA guidelines, no further cardiovascular testing needed.  The patient may proceed to surgery at acceptable risk.     2.  Essential hypertension  [name] blood pressure is controlled on his current regimen.  Continue current therapy.    3.  Chronic diastolic heart failure (HCC) NYHA 2.  His only diuretic is HCTZ.   Weight loss will help his volume management.     Dispo:  Return in about 1 year (around 09/09/2018) for Routine Follow Up w/ Brandon Marks.   Medication Adjustments/Labs and Tests Ordered: Current medicines are reviewed at length with the patient today.  Concerns regarding medicines are outlined above.  Tests Ordered: Orders Placed This Encounter  Procedures  . EKG 12-Lead   Medication Changes: No orders of the defined types were placed in this encounter.   Signed, Brandon Dopp, PA-C  09/09/2017 4:37 PM    [facility] [address], [place], [place]  [address] Phone: [phone]; Fax: [phone]

## 2017-09-11 DIAGNOSIS — I5030 Unspecified diastolic (congestive) heart failure: Secondary | ICD-10-CM | POA: Insufficient documentation

## 2017-09-14 DIAGNOSIS — Z9884 Bariatric surgery status: Secondary | ICD-10-CM | POA: Insufficient documentation

## 2017-09-17 MED ORDER — HYDROCODONE-ACETAMINOPHEN 5-325 MG PO TABS
1.00 | ORAL_TABLET | ORAL | Status: DC
Start: ? — End: 2017-09-17

## 2017-09-17 MED ORDER — LACTULOSE 10 GM/15ML PO SOLN
30.00 g | ORAL | Status: DC
Start: 2017-09-17 — End: 2017-09-17

## 2017-09-17 MED ORDER — POTASSIUM CHLORIDE IN NACL 20-0.9 MEQ/L-% IV SOLN
INTRAVENOUS | Status: DC
Start: ? — End: 2017-09-17

## 2017-09-17 MED ORDER — PROMETHAZINE HCL 25 MG PO TABS
25.00 | ORAL_TABLET | ORAL | Status: DC
Start: ? — End: 2017-09-17

## 2017-09-17 MED ORDER — ONDANSETRON 4 MG PO TBDP
8.00 | ORAL_TABLET | ORAL | Status: DC
Start: ? — End: 2017-09-17

## 2017-09-17 MED ORDER — SENNOSIDES-DOCUSATE SODIUM 8.6-50 MG PO TABS
1.00 | ORAL_TABLET | ORAL | Status: DC
Start: 2017-09-17 — End: 2017-09-17

## 2017-09-17 MED ORDER — ENOXAPARIN SODIUM 40 MG/0.4ML ~~LOC~~ SOLN
40.00 | SUBCUTANEOUS | Status: DC
Start: 2017-09-18 — End: 2017-09-17

## 2017-09-17 MED ORDER — ACETAMINOPHEN 500 MG PO TABS
500.00 | ORAL_TABLET | ORAL | Status: DC
Start: ? — End: 2017-09-17

## 2017-09-17 MED ORDER — PANTOPRAZOLE SODIUM 40 MG PO TBEC
40.00 | DELAYED_RELEASE_TABLET | ORAL | Status: DC
Start: 2017-09-18 — End: 2017-09-17

## 2017-09-17 MED ORDER — ATORVASTATIN CALCIUM 10 MG PO TABS
10.00 | ORAL_TABLET | ORAL | Status: DC
Start: 2017-09-18 — End: 2017-09-17

## 2018-01-01 ENCOUNTER — Inpatient Hospital Stay (HOSPITAL_COMMUNITY)
Admission: EM | Admit: 2018-01-01 | Discharge: 2018-01-12 | DRG: 853 | Disposition: A | Discharge status: Skilled Nursing Facility | Payer: Medicare Other | Attending: Surgery | Admitting: Surgery

## 2018-01-01 ENCOUNTER — Emergency Department (HOSPITAL_COMMUNITY): Payer: Medicare Other

## 2018-01-01 ENCOUNTER — Encounter (HOSPITAL_COMMUNITY): Payer: Self-pay | Admitting: Emergency Medicine

## 2018-01-01 DIAGNOSIS — W57XXXA Bitten or stung by nonvenomous insect and other nonvenomous arthropods, initial encounter: Secondary | ICD-10-CM | POA: Diagnosis present

## 2018-01-01 DIAGNOSIS — E860 Dehydration: Secondary | ICD-10-CM | POA: Diagnosis present

## 2018-01-01 DIAGNOSIS — S30863A Insect bite (nonvenomous) of scrotum and testes, initial encounter: Secondary | ICD-10-CM | POA: Diagnosis present

## 2018-01-01 DIAGNOSIS — R6521 Severe sepsis with septic shock: Secondary | ICD-10-CM | POA: Diagnosis present

## 2018-01-01 DIAGNOSIS — G4733 Obstructive sleep apnea (adult) (pediatric): Secondary | ICD-10-CM | POA: Diagnosis present

## 2018-01-01 DIAGNOSIS — Z8249 Family history of ischemic heart disease and other diseases of the circulatory system: Secondary | ICD-10-CM

## 2018-01-01 DIAGNOSIS — E785 Hyperlipidemia, unspecified: Secondary | ICD-10-CM | POA: Diagnosis present

## 2018-01-01 DIAGNOSIS — R197 Diarrhea, unspecified: Secondary | ICD-10-CM | POA: Diagnosis not present

## 2018-01-01 DIAGNOSIS — A4151 Sepsis due to Escherichia coli [E. coli]: Principal | ICD-10-CM | POA: Diagnosis present

## 2018-01-01 DIAGNOSIS — E876 Hypokalemia: Secondary | ICD-10-CM | POA: Diagnosis present

## 2018-01-01 DIAGNOSIS — Z79899 Other long term (current) drug therapy: Secondary | ICD-10-CM

## 2018-01-01 DIAGNOSIS — R339 Retention of urine, unspecified: Secondary | ICD-10-CM | POA: Diagnosis not present

## 2018-01-01 DIAGNOSIS — M726 Necrotizing fasciitis: Secondary | ICD-10-CM | POA: Diagnosis present

## 2018-01-01 DIAGNOSIS — Z6841 Body Mass Index (BMI) 40.0 and over, adult: Secondary | ICD-10-CM

## 2018-01-01 DIAGNOSIS — I491 Atrial premature depolarization: Secondary | ICD-10-CM | POA: Diagnosis present

## 2018-01-01 DIAGNOSIS — N182 Chronic kidney disease, stage 2 (mild): Secondary | ICD-10-CM | POA: Diagnosis present

## 2018-01-01 DIAGNOSIS — Z9884 Bariatric surgery status: Secondary | ICD-10-CM

## 2018-01-01 DIAGNOSIS — Z820 Family history of epilepsy and other diseases of the nervous system: Secondary | ICD-10-CM

## 2018-01-01 DIAGNOSIS — I129 Hypertensive chronic kidney disease with stage 1 through stage 4 chronic kidney disease, or unspecified chronic kidney disease: Secondary | ICD-10-CM | POA: Diagnosis present

## 2018-01-01 DIAGNOSIS — Z87440 Personal history of urinary (tract) infections: Secondary | ICD-10-CM

## 2018-01-01 DIAGNOSIS — K651 Peritoneal abscess: Secondary | ICD-10-CM

## 2018-01-01 DIAGNOSIS — R0602 Shortness of breath: Secondary | ICD-10-CM | POA: Diagnosis not present

## 2018-01-01 DIAGNOSIS — Z9119 Patient's noncompliance with other medical treatment and regimen: Secondary | ICD-10-CM

## 2018-01-01 DIAGNOSIS — N179 Acute kidney failure, unspecified: Secondary | ICD-10-CM

## 2018-01-01 DIAGNOSIS — K612 Anorectal abscess: Secondary | ICD-10-CM | POA: Diagnosis present

## 2018-01-01 DIAGNOSIS — K644 Residual hemorrhoidal skin tags: Secondary | ICD-10-CM | POA: Diagnosis present

## 2018-01-01 DIAGNOSIS — Z885 Allergy status to narcotic agent status: Secondary | ICD-10-CM

## 2018-01-01 DIAGNOSIS — I493 Ventricular premature depolarization: Secondary | ICD-10-CM | POA: Diagnosis present

## 2018-01-01 DIAGNOSIS — E8779 Other fluid overload: Secondary | ICD-10-CM | POA: Diagnosis not present

## 2018-01-01 DIAGNOSIS — Z9989 Dependence on other enabling machines and devices: Secondary | ICD-10-CM

## 2018-01-01 DIAGNOSIS — A427 Actinomycotic sepsis: Secondary | ICD-10-CM | POA: Diagnosis present

## 2018-01-01 LAB — COMPREHENSIVE METABOLIC PANEL
ALT: 20 U/L (ref 17–63)
AST: 25 U/L (ref 15–41)
Albumin: 2.8 g/dL — ABNORMAL LOW (ref 3.5–5.0)
Alkaline Phosphatase: 64 U/L (ref 38–126)
Anion gap: 12 (ref 5–15)
BUN: 43 mg/dL — ABNORMAL HIGH (ref 6–20)
CHLORIDE: 98 mmol/L — AB (ref 101–111)
CO2: 27 mmol/L (ref 22–32)
Calcium: 8.5 mg/dL — ABNORMAL LOW (ref 8.9–10.3)
Creatinine, Ser: 2.14 mg/dL — ABNORMAL HIGH (ref 0.61–1.24)
GFR, EST AFRICAN AMERICAN: 36 mL/min — AB (ref >60)
GFR, EST NON AFRICAN AMERICAN: 31 mL/min — AB (ref >60)
Glucose, Bld: 97 mg/dL (ref 65–99)
POTASSIUM: 2.9 mmol/L — AB (ref 3.5–5.1)
SODIUM: 137 mmol/L (ref 135–145)
Total Bilirubin: 0.9 mg/dL (ref 0.3–1.2)
Total Protein: 6.2 g/dL — ABNORMAL LOW (ref 6.5–8.1)

## 2018-01-01 LAB — CBC WITH DIFFERENTIAL/PLATELET
BASOS PCT: 1 %
Basophils Absolute: 0.2 10*3/uL — ABNORMAL HIGH (ref 0.0–0.1)
EOS PCT: 0 %
Eosinophils Absolute: 0 10*3/uL (ref 0.0–0.7)
HEMATOCRIT: 40.6 % (ref 39.0–52.0)
HEMOGLOBIN: 14 g/dL (ref 13.0–17.0)
Lymphocytes Relative: 4 %
Lymphs Abs: 0.7 10*3/uL (ref 0.7–4.0)
MCH: 29.5 pg (ref 26.0–34.0)
MCHC: 34.5 g/dL (ref 30.0–36.0)
MCV: 85.7 fL (ref 78.0–100.0)
MONOS PCT: 3 %
Monocytes Absolute: 0.6 10*3/uL (ref 0.1–1.0)
NEUTROS PCT: 92 %
Neutro Abs: 17 10*3/uL — ABNORMAL HIGH (ref 1.7–7.7)
Platelets: 356 10*3/uL (ref 150–400)
RBC: 4.74 MIL/uL (ref 4.22–5.81)
RDW: 14.2 % (ref 11.5–15.5)
WBC: 18.5 10*3/uL — AB (ref 4.0–10.5)

## 2018-01-01 LAB — I-STAT CG4 LACTIC ACID, ED
LACTIC ACID, VENOUS: 3.1 mmol/L — AB (ref 0.5–1.9)
Lactic Acid, Venous: 2.61 mmol/L (ref 0.5–1.9)

## 2018-01-01 MED ORDER — FENTANYL CITRATE (PF) 100 MCG/2ML IJ SOLN
50.0000 ug | Freq: Once | INTRAMUSCULAR | Status: AC
  Administered 2018-01-01: 50 ug via INTRAVENOUS
  Filled 2018-01-01: qty 2

## 2018-01-01 MED ORDER — SODIUM CHLORIDE 0.9 % IV SOLN
2.0000 g | Freq: Once | INTRAVENOUS | Status: AC
  Administered 2018-01-02: 2 g via INTRAVENOUS
  Filled 2018-01-01: qty 2

## 2018-01-01 MED ORDER — SODIUM CHLORIDE 0.9 % IV BOLUS
1000.0000 mL | Freq: Once | INTRAVENOUS | Status: AC
  Administered 2018-01-01: 1000 mL via INTRAVENOUS

## 2018-01-01 MED ORDER — VANCOMYCIN HCL 10 G IV SOLR
2000.0000 mg | Freq: Once | INTRAVENOUS | Status: AC
  Administered 2018-01-02: 2000 mg via INTRAVENOUS
  Filled 2018-01-01: qty 2000

## 2018-01-01 MED ORDER — SODIUM CHLORIDE 0.9 % IV SOLN
1.0000 g | Freq: Once | INTRAVENOUS | Status: AC
  Administered 2018-01-01: 1 g via INTRAVENOUS
  Filled 2018-01-01: qty 10

## 2018-01-01 MED ORDER — ONDANSETRON HCL 4 MG/2ML IJ SOLN
4.0000 mg | Freq: Once | INTRAMUSCULAR | Status: AC
  Administered 2018-01-01: 4 mg via INTRAVENOUS
  Filled 2018-01-01: qty 2

## 2018-01-01 MED ORDER — METRONIDAZOLE IN NACL 5-0.79 MG/ML-% IV SOLN
500.0000 mg | Freq: Once | INTRAVENOUS | Status: AC
  Administered 2018-01-01: 500 mg via INTRAVENOUS
  Filled 2018-01-01: qty 100

## 2018-01-01 MED ORDER — POTASSIUM CHLORIDE 10 MEQ/100ML IV SOLN
10.0000 meq | Freq: Once | INTRAVENOUS | Status: AC
  Administered 2018-01-02: 10 meq via INTRAVENOUS
  Filled 2018-01-01: qty 100

## 2018-01-01 MED ORDER — FENTANYL CITRATE (PF) 100 MCG/2ML IJ SOLN: 50.0000 ug | INTRAMUSCULAR | Status: DC | PRN

## 2018-01-01 MED ORDER — HYDROMORPHONE HCL 2 MG/ML IJ SOLN
1.0000 mg | Freq: Once | INTRAMUSCULAR | Status: AC
  Administered 2018-01-01: 1 mg via INTRAVENOUS
  Filled 2018-01-01: qty 1

## 2018-01-01 MED ORDER — MORPHINE SULFATE (PF) 4 MG/ML IV SOLN
4.0000 mg | Freq: Once | INTRAVENOUS | Status: AC
  Administered 2018-01-01: 4 mg via INTRAVENOUS
  Filled 2018-01-01: qty 1

## 2018-01-01 NOTE — ED Triage Notes (Signed)
Pt here from home with c/o hemorrhoids , pt has appointment next Thursday with surgeon

## 2018-01-01 NOTE — ED Provider Notes (Signed)
[facility] EMERGENCY DEPARTMENT Provider Note   CSN: [id number] Arrival date & time: 01/01/18  1703  History   Chief Complaint No chief complaint on file.   HPI Brandon Marks is a 62 y.o. male.  The history is provided by the patient.   62 yo M with PMHx of HTN, HLD who presents with worsening rectal pain x 1 wk. Pain achy, radiates to right buttock, severe, gradual onset. Associated with chills, nausea. Similar symptoms in the past with hemorrhoids. Worsened when he sits on buttock. No relief with OTC hemorrhoid wipes. Denies fever, vomiting, diarrhea.   Past Medical History:  Diagnosis Date  . Arthritis   . Herniated disc   . High cholesterol   . Hypertension   . Morbid obesity ([place])   . PAC (premature atrial contraction)   . Sleep apnea    non compliant with c-pap    Patient Active Problem List   Diagnosis Date Noted  . Super obesity 01/20/2015  . High cholesterol 01/20/2015  . Chest pain 02/27/2012  . Obesity, morbid ([place]) 02/27/2012  . Dyspnea, chronic DOE 02/27/2012  . Herniated disc   . HYPERLIPIDEMIA 02/23/2009  . 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 Essential hypertension 10/18/2007  . Obstructive sleep apnea 10/11/2007    Past Surgical History:  Procedure Laterality Date  . CARDIAC CATHETERIZATION  02/20/2009   normal coronary arteries  . KNEE ARTHROSCOPY  2010   Lt  . NM MYOCAR PERF WALL MOTION  09/28/2007   small area of reversibility in the anterolateral wall at the apex concerning for ischemia  . SHOULDER ARTHROSCOPY  4/12   Rt       Home Medications    Prior to Admission medications   Medication Sig Start Date End Date Taking? Authorizing Provider  levocetirizine (XYZAL) 5 MG tablet Take 5 mg by mouth daily. 12/29/17  Yes [provider]  lisinopril-hydrochlorothiazide (PRINZIDE,ZESTORETIC) 20-25 MG tablet Take 1 tablet by mouth daily. 12/19/16  Yes Almyra Deforest, PA  lovastatin (MEVACOR) 40 MG tablet  Take 1 tablet (40 mg total) by mouth at bedtime. 12/19/16  Yes Almyra Deforest, PA  omeprazole (PRILOSEC) 20 MG capsule Take 1 capsule by mouth daily. 11/26/17  Yes [provider]  predniSONE (DELTASONE) 5 MG tablet Take by mouth See admin instructions. TAKE 6 TABLETS BY MOUTH DAILY FOR 2 DAYS THEN 5 TABS DAILY FOR 2 DAYS THEN 4 TABS DAILY FOR 2 DAYS THEN 3 TABS DAILY FOR 2 DAYS THEN 2 TABS 12/29/17   [provider]    Family History Family History  Problem Relation Age of Onset  . Hypertension Mother   . Heart failure Mother   . Heart failure Father   . Alzheimer's disease Maternal Grandmother   . Alzheimer's disease Maternal Grandfather     Social History Social History   Tobacco Use  . Smoking status: Never Smoker  . Smokeless tobacco: Never Used  Substance Use Topics  . Alcohol use: No  . Drug use: No     Allergies   Codeine and Oxycodone   Review of Systems Review of Systems  Constitutional: Positive for chills. Negative for fever.  HENT: Negative for ear pain and sore throat.   Eyes: Negative for pain and visual disturbance.  Respiratory: Negative for cough and shortness of breath.   Cardiovascular: Negative for chest pain and palpitations.  Gastrointestinal: Positive for nausea. Negative for abdominal pain and vomiting.  Genitourinary: Negative for dysuria and hematuria.  Musculoskeletal: Positive for myalgias. Negative for arthralgias and back pain.  Skin: Negative for color change and rash.  Neurological: Negative for seizures and syncope.  All other systems reviewed and are negative.    Physical Exam Updated Vital Signs BP (!) 80/53 (BP Location: Left Arm)   Pulse 77   Temp 98.9 F (37.2 C) (Oral)   Resp 20   SpO2 96%   Physical Exam  Constitutional: He appears well-developed and well-nourished.  HENT:  Head: Normocephalic and atraumatic.  Mouth/Throat: Mucous membranes are dry.  Eyes: Conjunctivae and EOM are normal.  Neck: Neck  supple.  Cardiovascular: Normal rate, regular rhythm and intact distal pulses.  No murmur heard. Pulmonary/Chest: Effort normal and breath sounds normal. No stridor. No respiratory distress.  Abdominal: Soft. He exhibits no distension. There is no tenderness. There is no rebound and no guarding.  Genitourinary:  Genitourinary Comments: Two large skin perianal skin flaps, tenderness and fluctuance to left perirectal space on DRE, erythema and induration from anus to right buttock, TTP  Musculoskeletal: He exhibits no edema.  Neurological: He is alert.  Skin: Skin is warm and dry.  Psychiatric: He has a normal mood and affect.  Nursing note and vitals reviewed.    ED Treatments / Results  Labs (all labs ordered are listed, but only abnormal results are displayed) Labs Reviewed  COMPREHENSIVE METABOLIC PANEL - Abnormal; Notable for the following components:      Result Value   Potassium 2.9 (*)    Chloride 98 (*)    BUN 43 (*)    Creatinine, Ser 2.14 (*)    Calcium 8.5 (*)    Total Protein 6.2 (*)    Albumin 2.8 (*)    GFR calc non Af Amer 31 (*)    GFR calc Af Amer 36 (*)    All other components within normal limits  CBC WITH DIFFERENTIAL/PLATELET - Abnormal; Notable for the following components:   WBC 18.5 (*)    Neutro Abs 17.0 (*)    Basophils Absolute 0.2 (*)    All other components within normal limits  I-STAT CG4 LACTIC ACID, ED - Abnormal; Notable for the following components:   Lactic Acid, Venous 2.61 (*)    All other components within normal limits  I-STAT CG4 LACTIC ACID, ED - Abnormal; Notable for the following components:   Lactic Acid, Venous 3.10 (*)    All other components within normal limits  CULTURE, BLOOD (ROUTINE X 2)  CULTURE, BLOOD (ROUTINE X 2)    EKG None  Radiology Ct Abdomen Pelvis Wo Contrast  Result Date: 01/01/2018 CLINICAL DATA:  Acute onset of rectal pain. Assess for abscess. EXAM: CT ABDOMEN AND PELVIS WITHOUT CONTRAST TECHNIQUE:  Multidetector CT imaging of the abdomen and pelvis was performed following the standard protocol without IV contrast. COMPARISON:  MRI of the lumbar spine performed 01/13/2013 FINDINGS: Lower chest: Minimal bibasilar atelectasis is noted. The visualized portions of the mediastinum are unremarkable Hepatobiliary: The liver is unremarkable in appearance. The gallbladder is unremarkable in appearance. The common bile duct remains normal in caliber. Pancreas: The pancreas is within normal limits. Spleen: The spleen is unremarkable in appearance. Adrenals/Urinary Tract: A 1.7 cm left adrenal adenoma is noted. A 2.0 cm thin right adrenal adenoma is also seen. Nonspecific perinephric stranding is noted bilaterally. There is no evidence of hydronephrosis. A nonobstructing 7 mm stone is noted at the upper pole of the left kidney. The kidneys are otherwise unremarkable. No obstructing ureteral stones are  seen. Stomach/Bowel: The patient is status post gastric bypass surgery. The gastrojejunal anastomosis is unremarkable in appearance. The distal stomach is largely decompressed. The small bowel is within normal limits. The appendix is normal in caliber, without evidence of appendicitis. Scattered diverticulosis is noted along the proximal sigmoid colon. There is leftward displacement of the rectum, reflecting the soft tissue infection along the right side of the pelvis. Vascular/Lymphatic: The abdominal aorta is unremarkable in appearance. The inferior vena cava is grossly unremarkable. No retroperitoneal lymphadenopathy is seen. Mildly prominent right-sided pelvic sidewall nodes likely reflect the adjacent soft tissue infection. Reproductive: The bladder is mildly distended. Mild soft tissue inflammation is noted about the right side of the bladder. The prostate remains normal in size. Other: There is a large abscess containing fluid and air at the right perineal space anterior to the anorectal canal, measuring approximately  7.2 x 3.9 x 6.0 cm. Diffuse soft tissue air tracks along the right side of the gluteal cleft and medial to the right gluteus musculature, extending superiorly to the right of the rectum and along the right pelvic sidewall. A small amount of air tracks superiorly along the right psoas musculature to the level of the kidneys. This pattern is highly suspicious for necrotizing fasciitis. Underlying diffuse soft tissue inflammation is noted. Musculoskeletal: No acute osseous abnormalities are identified. Anterior bridging osteophytes are noted along the lower thoracic and lumbar spine. The visualized musculature is unremarkable in appearance. IMPRESSION: 1. Large abscess containing fluid and air at the right perineal space anterior to the anorectal canal, measuring approximately 7.2 x 3.9 x 6.0 cm. 2. Diffuse soft tissue air noted at the right side of the gluteal cleft and medial to the right gluteus musculature, extending superiorly to the right of the rectum and along the right pelvic sidewall. Small amount of air tracks superiorly along the right psoas musculature to the level of the kidneys. This pattern is highly suspicious for necrotizing fasciitis. Underlying diffuse soft tissue inflammation noted. 3. Mildly prominent right-sided pelvic sidewall nodes likely reflect the adjacent soft tissue infection. 4. Scattered diverticulosis along the proximal sigmoid colon, without evidence of diverticulitis. 5. Nonobstructing 7 mm stone at the upper pole of the left kidney. 6. Bilateral adrenal adenomas noted. Critical Value/emergent results were called by telephone at the time of interpretation on 01/01/2018 at 11:18 pm to Dr. Nanda Quinton, who verbally acknowledged these results. Electronically Signed   By: Garald Balding M.D.   On: 01/01/2018 23:41    Procedures Procedures (including critical care time)  Medications Ordered in ED Medications  vancomycin (VANCOCIN) 2,000 mg in sodium chloride 0.9 % 500 mL IVPB (  Intravenous MAR Hold 01/02/18 0129)  fentaNYL (SUBLIMAZE) injection 50 mcg ( Intravenous MAR Hold 01/02/18 0129)  0.9 % irrigation (POUR BTL) (1,000 mLs Irrigation Given 01/02/18 0022)  fentaNYL (SUBLIMAZE) injection 50 mcg (50 mcg Intravenous Given 01/01/18 1905)  sodium chloride 0.9 % bolus 1,000 mL (0 mLs Intravenous Stopped 01/01/18 2049)  cefTRIAXone (ROCEPHIN) 1 g in sodium chloride 0.9 % 100 mL IVPB (0 g Intravenous Stopped 01/01/18 2202)  metroNIDAZOLE (FLAGYL) IVPB 500 mg (0 mg Intravenous Stopped 01/01/18 2317)  morphine 4 MG/ML injection 4 mg (4 mg Intravenous Given 01/01/18 2045)  sodium chloride 0.9 % bolus 1,000 mL (1,000 mLs Intravenous Transfusing/Transfer 01/02/18 0055)  HYDROmorphone (DILAUDID) injection 1 mg (1 mg Intravenous Given 01/01/18 2205)  ondansetron (ZOFRAN) injection 4 mg (4 mg Intravenous Given 01/01/18 2204)  sodium chloride 0.9 % bolus 1,000 mL (0  mLs Intravenous Stopped 01/02/18 0001)  potassium chloride 10 mEq in 100 mL IVPB (10 mEq Intravenous Transfusing/Transfer 01/02/18 0056)  ceFEPIme (MAXIPIME) 2 g in sodium chloride 0.9 % 100 mL IVPB (2 g Intravenous Transfusing/Transfer 01/02/18 0055)     Initial Impression / Assessment and Plan / ED Course  I have reviewed the triage vital signs and the nursing notes.  Pertinent labs & imaging results that were available during my care of the patient were reviewed by me and considered in my medical decision making (see chart for details).     Brandon Marks is a 62 y.o. male with PMHx of DM who p/w increasing severe buttock pain x 1 wk. Reviewed and confirmed nursing documentation for past medical history, family history, social history. VS afebrile, BP wnl. Exam remarkable for tenderness and fluctuance on DRE concerning for perianal abscess with obvious cellulitis to R buttock.   2L NS bolus given. IV narcotics, IV zofran given. Lactic acid 2.61 -> 3.10. CMP with hypokalemia 2.9, AKI with Cr 2.14. IV potassium given. CBC with leukocytosis  of 18.5, otherwise unremarkable. CT abd/pelvis w/ contrast (given patient's AKI) with large abscess containing fluid and air to R perineal space, diffuse soft tissue air to right gluteal cleft with small amount of air that tracks superiorly along R psoas muscle concerning for necrotizing fasciitis.   General surgery consulted, evaluated patient, will admit. IV ceftraixone and metronidazole initially given for cellulitis associated with abscess, IV vanc and cefepime added for broader spectrum coverage in setting of concerning for nec fasc.   Old records reviewed. Labs reviewed by me and used in the medical decision making.  Imaging viewed and interpreted by me and used in the medical decision making (formal interpretation from radiologist).    Final Clinical Impressions(s) / ED Diagnoses   Final diagnoses:  AKI (acute kidney injury) ([place])  Pelvic abscess in male [facility])  Necrotizing fasciitis [facility])    ED Discharge Orders    None       Norm Salt, MD 01/02/18 0131    Margette Fast, MD 01/02/18 [id number]

## 2018-01-01 NOTE — H&P (Addendum)
Brandon Marks is an 62 y.o. male.   Chief Complaint: perianal pain HPI: This gentleman presents with a one-week history of perianal pain.  He denies fevers and chills.  Surgery was consulted after a CT scan showed the perirectal abscess.  He reports having a previous one approximately 20 years ago.  He is otherwise without complaints.  He reports recently having had gastric bypass surgery at [facility] in February.  He has no issues moving his bowels.  Past Medical History:  Diagnosis Date  . Arthritis   . Herniated disc   . High cholesterol   . Hypertension   . Morbid obesity ([place])   . PAC (premature atrial contraction)   . Sleep apnea    non compliant with c-pap    Past Surgical History:  Procedure Laterality Date  . CARDIAC CATHETERIZATION  02/20/2009   normal coronary arteries  . KNEE ARTHROSCOPY  2010   Lt  . NM MYOCAR PERF WALL MOTION  09/28/2007   small area of reversibility in the anterolateral wall at the apex concerning for ischemia  . SHOULDER ARTHROSCOPY  4/12   Rt    Family History  Problem Relation Age of Onset  . Hypertension Mother   . Heart failure Mother   . Heart failure Father   . Alzheimer's disease Maternal Grandmother   . Alzheimer's disease Maternal Grandfather    Social History:  reports that he has never smoked. He has never used smokeless tobacco. He reports that he does not drink alcohol or use drugs.  Allergies:  Allergies  Allergen Reactions  . Codeine Nausea And Vomiting  . Oxycodone Other (See Comments)    Upset GI     (Not in a hospital admission)  Results for orders placed or performed during the hospital encounter of 01/01/18 (from the past 48 hour(s))  Comprehensive metabolic panel     Status: Abnormal   Collection Time: 01/01/18  6:35 PM  Result Value Ref Range   Sodium 137 135 - 145 mmol/L   Potassium 2.9 (L) 3.5 - 5.1 mmol/L   Chloride 98 (L) 101 - 111 mmol/L   CO2 27 22 - 32 mmol/L   Glucose, Bld 97 65 - 99 mg/dL   BUN  43 (H) 6 - 20 mg/dL   Creatinine, Ser 2.14 (H) 0.61 - 1.24 mg/dL   Calcium 8.5 (L) 8.9 - 10.3 mg/dL   Total Protein 6.2 (L) 6.5 - 8.1 g/dL   Albumin 2.8 (L) 3.5 - 5.0 g/dL   AST 25 15 - 41 U/L   ALT 20 17 - 63 U/L   Alkaline Phosphatase 64 38 - 126 U/L   Total Bilirubin 0.9 0.3 - 1.2 mg/dL   GFR calc non Af Amer 31 (L) >60 mL/min   GFR calc Af Amer 36 (L) >60 mL/min    Comment: (NOTE) The eGFR has been calculated using the CKD EPI equation. This calculation has not been validated in all clinical situations. eGFR's persistently <60 mL/min signify possible Chronic Kidney Disease.    Anion gap 12 5 - 15    Comment: Performed at [facility] [address], [place], [place] [address]  CBC with Differential     Status: Abnormal   Collection Time: 01/01/18  6:35 PM  Result Value Ref Range   WBC 18.5 (H) 4.0 - 10.5 K/uL   RBC 4.74 4.22 - 5.81 MIL/uL   Hemoglobin 14.0 13.0 - 17.0 g/dL   HCT 40.6 39.0 - 52.0 %  MCV 85.7 78.0 - 100.0 fL   MCH 29.5 26.0 - 34.0 pg   MCHC 34.5 30.0 - 36.0 g/dL   RDW 14.2 11.5 - 15.5 %   Platelets 356 150 - 400 K/uL   Neutrophils Relative % 92 %   Lymphocytes Relative 4 %   Monocytes Relative 3 %   Eosinophils Relative 0 %   Basophils Relative 1 %   Neutro Abs 17.0 (H) 1.7 - 7.7 K/uL   Lymphs Abs 0.7 0.7 - 4.0 K/uL   Monocytes Absolute 0.6 0.1 - 1.0 K/uL   Eosinophils Absolute 0.0 0.0 - 0.7 K/uL   Basophils Absolute 0.2 (H) 0.0 - 0.1 K/uL   WBC Morphology DOHLE BODIES     Comment: INCREASED BANDS (>20% BANDS) Performed at [facility] [address] [place], [place] [address]   I-Stat CG4 Lactic Acid, ED     Status: Abnormal   Collection Time: 01/01/18  6:36 PM  Result Value Ref Range   Lactic Acid, Venous 2.61 (HH) 0.5 - 1.9 mmol/L   Comment NOTIFIED PHYSICIAN   I-Stat CG4 Lactic Acid, ED     Status: Abnormal   Collection Time: 01/01/18  9:30 PM  Result Value Ref Range   Lactic Acid, Venous 3.10 (HH) 0.5 - 1.9 mmol/L   Comment  NOTIFIED PHYSICIAN    Ct Abdomen Pelvis Wo Contrast  Result Date: 01/01/2018 CLINICAL DATA:  Acute onset of rectal pain. Assess for abscess. EXAM: CT ABDOMEN AND PELVIS WITHOUT CONTRAST TECHNIQUE: Multidetector CT imaging of the abdomen and pelvis was performed following the standard protocol without IV contrast. COMPARISON:  MRI of the lumbar spine performed 01/13/2013 FINDINGS: Lower chest: Minimal bibasilar atelectasis is noted. The visualized portions of the mediastinum are unremarkable Hepatobiliary: The liver is unremarkable in appearance. The gallbladder is unremarkable in appearance. The common bile duct remains normal in caliber. Pancreas: The pancreas is within normal limits. Spleen: The spleen is unremarkable in appearance. Adrenals/Urinary Tract: A 1.7 cm left adrenal adenoma is noted. A 2.0 cm thin right adrenal adenoma is also seen. Nonspecific perinephric stranding is noted bilaterally. There is no evidence of hydronephrosis. A nonobstructing 7 mm stone is noted at the upper pole of the left kidney. The kidneys are otherwise unremarkable. No obstructing ureteral stones are seen. Stomach/Bowel: The patient is status post gastric bypass surgery. The gastrojejunal anastomosis is unremarkable in appearance. The distal stomach is largely decompressed. The small bowel is within normal limits. The appendix is normal in caliber, without evidence of appendicitis. Scattered diverticulosis is noted along the proximal sigmoid colon. There is leftward displacement of the rectum, reflecting the soft tissue infection along the right side of the pelvis. Vascular/Lymphatic: The abdominal aorta is unremarkable in appearance. The inferior vena cava is grossly unremarkable. No retroperitoneal lymphadenopathy is seen. Mildly prominent right-sided pelvic sidewall nodes likely reflect the adjacent soft tissue infection. Reproductive: The bladder is mildly distended. Mild soft tissue inflammation is noted about the right  side of the bladder. The prostate remains normal in size. Other: There is a large abscess containing fluid and air at the right perineal space anterior to the anorectal canal, measuring approximately 7.2 x 3.9 x 6.0 cm. Diffuse soft tissue air tracks along the right side of the gluteal cleft and medial to the right gluteus musculature, extending superiorly to the right of the rectum and along the right pelvic sidewall. A small amount of air tracks superiorly along the right psoas musculature to the level of the  kidneys. This pattern is highly suspicious for necrotizing fasciitis. Underlying diffuse soft tissue inflammation is noted. Musculoskeletal: No acute osseous abnormalities are identified. Anterior bridging osteophytes are noted along the lower thoracic and lumbar spine. The visualized musculature is unremarkable in appearance. IMPRESSION: 1. Large abscess containing fluid and air at the right perineal space anterior to the anorectal canal, measuring approximately 7.2 x 3.9 x 6.0 cm. 2. Diffuse soft tissue air noted at the right side of the gluteal cleft and medial to the right gluteus musculature, extending superiorly to the right of the rectum and along the right pelvic sidewall. Small amount of air tracks superiorly along the right psoas musculature to the level of the kidneys. This pattern is highly suspicious for necrotizing fasciitis. Underlying diffuse soft tissue inflammation noted. 3. Mildly prominent right-sided pelvic sidewall nodes likely reflect the adjacent soft tissue infection. 4. Scattered diverticulosis along the proximal sigmoid colon, without evidence of diverticulitis. 5. Nonobstructing 7 mm stone at the upper pole of the left kidney. 6. Bilateral adrenal adenomas noted. Critical Value/emergent results were called by telephone at the time of interpretation on 01/01/2018 at 11:18 pm to Dr. Nanda Quinton, who verbally acknowledged these results. Electronically Signed   By: Garald Balding M.D.    On: 01/01/2018 23:41    Review of Systems  Constitutional: Positive for chills and fever.  Respiratory: Negative for shortness of breath.   Cardiovascular: Negative for chest pain.  All other systems reviewed and are negative.   Blood pressure (!) 80/53, pulse 77, temperature 98.9 F (37.2 C), temperature source Oral, resp. rate 20, SpO2 96 %. Physical Exam  Constitutional: He is oriented to person, place, and time. He appears well-developed and well-nourished. He appears distressed.  HENT:  Head: Normocephalic and atraumatic.  Right Ear: External ear normal.  Left Ear: External ear normal.  Nose: Nose normal.  Mouth/Throat: Oropharynx is clear and moist. No oropharyngeal exudate.  Eyes: Pupils are equal, round, and reactive to light. Right eye exhibits no discharge. Left eye exhibits no discharge. No scleral icterus.  Neck: Normal range of motion. No tracheal deviation present.  Cardiovascular: Normal heart sounds and intact distal pulses.  No murmur heard. Tachycardic  Respiratory: Effort normal and breath sounds normal. No respiratory distress. He has no wheezes.  GI: Soft. There is no tenderness.  Genitourinary:  Genitourinary Comments: Multiple large perianal skin tags.  Obvious abscess in the anal canal with surrounding perianal erythema and possible crepitus  Musculoskeletal: Normal range of motion. He exhibits no tenderness or deformity.  Neurological: He is alert and oriented to person, place, and time.  Skin: Skin is warm. He is diaphoretic. There is erythema.  Psychiatric: His behavior is normal. Judgment normal.     Assessment/Plan Perirectal abscess  This was fairly obvious on physical examination.  There may be a necrotizing component to this.  He is febrile and dehydrated.  He needs urgent incision, drainage, and possible debridement in the operating room tonight.  IV in a box of been started.  I discussed this with him in detail.  I discussed the risk which  includes but is not limited to bleeding, infection, injury to surrounding structures, incontinence, the need for multiple further procedures, cardiopulmonary issues, etc.  He understands and agrees to proceed  Harl Bowie, MD 01/01/2018, 11:55 PM

## 2018-01-02 ENCOUNTER — Encounter (HOSPITAL_COMMUNITY): Admission: EM | Disposition: A | Discharge status: Skilled Nursing Facility | Payer: Self-pay | Source: Home / Self Care

## 2018-01-02 ENCOUNTER — Encounter (HOSPITAL_COMMUNITY): Payer: Self-pay | Admitting: Anesthesiology

## 2018-01-02 ENCOUNTER — Emergency Department (HOSPITAL_COMMUNITY): Payer: Medicare Other | Admitting: Anesthesiology

## 2018-01-02 DIAGNOSIS — E8779 Other fluid overload: Secondary | ICD-10-CM | POA: Diagnosis not present

## 2018-01-02 DIAGNOSIS — R0602 Shortness of breath: Secondary | ICD-10-CM | POA: Diagnosis not present

## 2018-01-02 DIAGNOSIS — Z9884 Bariatric surgery status: Secondary | ICD-10-CM | POA: Diagnosis not present

## 2018-01-02 DIAGNOSIS — M726 Necrotizing fasciitis: Secondary | ICD-10-CM | POA: Diagnosis present

## 2018-01-02 DIAGNOSIS — R197 Diarrhea, unspecified: Secondary | ICD-10-CM | POA: Diagnosis not present

## 2018-01-02 DIAGNOSIS — I129 Hypertensive chronic kidney disease with stage 1 through stage 4 chronic kidney disease, or unspecified chronic kidney disease: Secondary | ICD-10-CM | POA: Diagnosis present

## 2018-01-02 DIAGNOSIS — R9431 Abnormal electrocardiogram [ECG] [EKG]: Secondary | ICD-10-CM

## 2018-01-02 DIAGNOSIS — I498 Other specified cardiac arrhythmias: Secondary | ICD-10-CM | POA: Diagnosis not present

## 2018-01-02 DIAGNOSIS — A419 Sepsis, unspecified organism: Secondary | ICD-10-CM

## 2018-01-02 DIAGNOSIS — K644 Residual hemorrhoidal skin tags: Secondary | ICD-10-CM | POA: Diagnosis present

## 2018-01-02 DIAGNOSIS — Z6841 Body Mass Index (BMI) 40.0 and over, adult: Secondary | ICD-10-CM | POA: Diagnosis not present

## 2018-01-02 DIAGNOSIS — I493 Ventricular premature depolarization: Secondary | ICD-10-CM | POA: Diagnosis present

## 2018-01-02 DIAGNOSIS — G4733 Obstructive sleep apnea (adult) (pediatric): Secondary | ICD-10-CM | POA: Diagnosis present

## 2018-01-02 DIAGNOSIS — A427 Actinomycotic sepsis: Secondary | ICD-10-CM | POA: Diagnosis not present

## 2018-01-02 DIAGNOSIS — S30863A Insect bite (nonvenomous) of scrotum and testes, initial encounter: Secondary | ICD-10-CM | POA: Diagnosis present

## 2018-01-02 DIAGNOSIS — E785 Hyperlipidemia, unspecified: Secondary | ICD-10-CM | POA: Diagnosis present

## 2018-01-02 DIAGNOSIS — R6521 Severe sepsis with septic shock: Secondary | ICD-10-CM | POA: Diagnosis not present

## 2018-01-02 DIAGNOSIS — N179 Acute kidney failure, unspecified: Secondary | ICD-10-CM | POA: Diagnosis not present

## 2018-01-02 DIAGNOSIS — W57XXXA Bitten or stung by nonvenomous insect and other nonvenomous arthropods, initial encounter: Secondary | ICD-10-CM | POA: Diagnosis present

## 2018-01-02 DIAGNOSIS — K651 Peritoneal abscess: Secondary | ICD-10-CM | POA: Diagnosis not present

## 2018-01-02 DIAGNOSIS — R339 Retention of urine, unspecified: Secondary | ICD-10-CM | POA: Diagnosis not present

## 2018-01-02 DIAGNOSIS — E876 Hypokalemia: Secondary | ICD-10-CM | POA: Diagnosis not present

## 2018-01-02 DIAGNOSIS — I491 Atrial premature depolarization: Secondary | ICD-10-CM | POA: Diagnosis present

## 2018-01-02 DIAGNOSIS — K612 Anorectal abscess: Secondary | ICD-10-CM | POA: Diagnosis not present

## 2018-01-02 DIAGNOSIS — N182 Chronic kidney disease, stage 2 (mild): Secondary | ICD-10-CM | POA: Diagnosis present

## 2018-01-02 DIAGNOSIS — E860 Dehydration: Secondary | ICD-10-CM | POA: Diagnosis not present

## 2018-01-02 DIAGNOSIS — I10 Essential (primary) hypertension: Secondary | ICD-10-CM | POA: Diagnosis not present

## 2018-01-02 DIAGNOSIS — E8770 Fluid overload, unspecified: Secondary | ICD-10-CM | POA: Diagnosis not present

## 2018-01-02 DIAGNOSIS — A4151 Sepsis due to Escherichia coli [E. coli]: Secondary | ICD-10-CM | POA: Diagnosis not present

## 2018-01-02 HISTORY — PX: INCISION AND DRAINAGE PERIRECTAL ABSCESS: SHX1804

## 2018-01-02 LAB — URINALYSIS, ROUTINE W REFLEX MICROSCOPIC
Bilirubin Urine: NEGATIVE
Glucose, UA: NEGATIVE mg/dL
KETONES UR: NEGATIVE mg/dL
Nitrite: NEGATIVE
PH: 5 (ref 5.0–8.0)
PROTEIN: NEGATIVE mg/dL
Specific Gravity, Urine: 1.019 (ref 1.005–1.030)

## 2018-01-02 LAB — CBC
HEMATOCRIT: 42.9 % (ref 39.0–52.0)
Hemoglobin: 15 g/dL (ref 13.0–17.0)
MCH: 29.7 pg (ref 26.0–34.0)
MCHC: 35 g/dL (ref 30.0–36.0)
MCV: 85 fL (ref 78.0–100.0)
PLATELETS: 247 10*3/uL (ref 150–400)
RBC: 5.05 MIL/uL (ref 4.22–5.81)
RDW: 14.5 % (ref 11.5–15.5)
WBC: 24.7 10*3/uL — AB (ref 4.0–10.5)

## 2018-01-02 LAB — BASIC METABOLIC PANEL
ANION GAP: 12 (ref 5–15)
BUN: 47 mg/dL — ABNORMAL HIGH (ref 6–20)
CALCIUM: 7.8 mg/dL — AB (ref 8.9–10.3)
CO2: 20 mmol/L — ABNORMAL LOW (ref 22–32)
Chloride: 105 mmol/L (ref 101–111)
Creatinine, Ser: 1.9 mg/dL — ABNORMAL HIGH (ref 0.61–1.24)
GFR, EST AFRICAN AMERICAN: 42 mL/min — AB (ref >60)
GFR, EST NON AFRICAN AMERICAN: 36 mL/min — AB (ref >60)
Glucose, Bld: 100 mg/dL — ABNORMAL HIGH (ref 65–99)
Potassium: 3.4 mmol/L — ABNORMAL LOW (ref 3.5–5.1)
Sodium: 137 mmol/L (ref 135–145)

## 2018-01-02 LAB — LACTIC ACID, PLASMA: Lactic Acid, Venous: 1.9 mmol/L (ref 0.5–1.9)

## 2018-01-02 SURGERY — INCISION AND DRAINAGE, ABSCESS, PERIRECTAL
Anesthesia: General | Site: Rectum

## 2018-01-02 MED ORDER — SUCCINYLCHOLINE CHLORIDE 200 MG/10ML IV SOSY
PREFILLED_SYRINGE | INTRAVENOUS | Status: AC
Start: 2018-01-02 — End: ?
  Filled 2018-01-02: qty 10

## 2018-01-02 MED ORDER — ENOXAPARIN SODIUM 40 MG/0.4ML ~~LOC~~ SOLN
40.0000 mg | SUBCUTANEOUS | Status: DC
  Administered 2018-01-03 – 2018-01-09 (×6): 40 mg via SUBCUTANEOUS
  Filled 2018-01-02 (×7): qty 0.4

## 2018-01-02 MED ORDER — 0.9 % SODIUM CHLORIDE (POUR BTL) OPTIME
TOPICAL | Status: DC | PRN
  Administered 2018-01-02: 1000 mL

## 2018-01-02 MED ORDER — DEXTROSE 5 % IV SOLN
INTRAVENOUS | Status: DC | PRN
  Administered 2018-01-02: 75 ug/min via INTRAVENOUS

## 2018-01-02 MED ORDER — SUCCINYLCHOLINE CHLORIDE 200 MG/10ML IV SOSY
PREFILLED_SYRINGE | INTRAVENOUS | Status: DC | PRN
  Administered 2018-01-02: 120 mg via INTRAVENOUS

## 2018-01-02 MED ORDER — MORPHINE SULFATE (PF) 2 MG/ML IV SOLN
1.0000 mg | INTRAVENOUS | Status: DC | PRN
  Administered 2018-01-02: 1 mg via INTRAVENOUS
  Administered 2018-01-03 – 2018-01-04 (×8): 2 mg via INTRAVENOUS
  Filled 2018-01-02 (×9): qty 1

## 2018-01-02 MED ORDER — DIPHENHYDRAMINE HCL 12.5 MG/5ML PO ELIX: 12.5000 mg | ORAL_SOLUTION | Freq: Four times a day (QID) | ORAL | Status: DC | PRN

## 2018-01-02 MED ORDER — PROPOFOL 10 MG/ML IV BOLUS
INTRAVENOUS | Status: AC
  Filled 2018-01-02: qty 40

## 2018-01-02 MED ORDER — BUPIVACAINE-EPINEPHRINE (PF) 0.5% -1:200000 IJ SOLN
INTRAMUSCULAR | Status: AC
  Filled 2018-01-02: qty 30

## 2018-01-02 MED ORDER — SODIUM CHLORIDE 0.9 % IV SOLN
2.0000 g | Freq: Two times a day (BID) | INTRAVENOUS | Status: DC
  Administered 2018-01-02 – 2018-01-03 (×4): 2 g via INTRAVENOUS
  Filled 2018-01-02 (×6): qty 2

## 2018-01-02 MED ORDER — BUPIVACAINE HCL (PF) 0.5 % IJ SOLN
INTRAMUSCULAR | Status: AC
  Filled 2018-01-02: qty 30

## 2018-01-02 MED ORDER — SODIUM CHLORIDE 0.9% FLUSH: 9.0000 mL | INTRAVENOUS | Status: DC | PRN

## 2018-01-02 MED ORDER — ONDANSETRON HCL 4 MG/2ML IJ SOLN
4.0000 mg | Freq: Four times a day (QID) | INTRAMUSCULAR | Status: DC | PRN
  Administered 2018-01-03 – 2018-01-07 (×5): 4 mg via INTRAVENOUS
  Filled 2018-01-02 (×5): qty 2

## 2018-01-02 MED ORDER — DIPHENHYDRAMINE HCL 50 MG/ML IJ SOLN: 12.5000 mg | Freq: Four times a day (QID) | INTRAMUSCULAR | Status: DC | PRN

## 2018-01-02 MED ORDER — MIDAZOLAM HCL 2 MG/2ML IJ SOLN
INTRAMUSCULAR | Status: AC
  Filled 2018-01-02: qty 2

## 2018-01-02 MED ORDER — PHENYLEPHRINE 40 MCG/ML (10ML) SYRINGE FOR IV PUSH (FOR BLOOD PRESSURE SUPPORT)
PREFILLED_SYRINGE | INTRAVENOUS | Status: AC
  Filled 2018-01-02: qty 20

## 2018-01-02 MED ORDER — ROCURONIUM BROMIDE 100 MG/10ML IV SOLN
INTRAVENOUS | Status: DC | PRN
  Administered 2018-01-02: 50 mg via INTRAVENOUS

## 2018-01-02 MED ORDER — SODIUM CHLORIDE 0.9 % IV BOLUS
500.0000 mL | Freq: Once | INTRAVENOUS | Status: AC
  Administered 2018-01-02: 500 mL via INTRAVENOUS

## 2018-01-02 MED ORDER — VANCOMYCIN HCL IN DEXTROSE 1-5 GM/200ML-% IV SOLN
1000.0000 mg | Freq: Two times a day (BID) | INTRAVENOUS | Status: DC
  Administered 2018-01-02 – 2018-01-04 (×6): 1000 mg via INTRAVENOUS
  Filled 2018-01-02 (×10): qty 200

## 2018-01-02 MED ORDER — ROCURONIUM BROMIDE 10 MG/ML (PF) SYRINGE
PREFILLED_SYRINGE | INTRAVENOUS | Status: AC
Start: 2018-01-02 — End: ?
  Filled 2018-01-02: qty 5

## 2018-01-02 MED ORDER — SUGAMMADEX SODIUM 500 MG/5ML IV SOLN
INTRAVENOUS | Status: DC | PRN
  Administered 2018-01-02: 300 mg via INTRAVENOUS

## 2018-01-02 MED ORDER — PHENYLEPHRINE HCL 10 MG/ML IJ SOLN
INTRAMUSCULAR | Status: DC | PRN
  Administered 2018-01-02: 120 ug via INTRAVENOUS
  Administered 2018-01-02 (×4): 80 ug via INTRAVENOUS

## 2018-01-02 MED ORDER — NALOXONE HCL 0.4 MG/ML IJ SOLN: 0.4000 mg | INTRAMUSCULAR | Status: DC | PRN

## 2018-01-02 MED ORDER — FENTANYL CITRATE (PF) 100 MCG/2ML IJ SOLN: 25.0000 ug | INTRAMUSCULAR | Status: DC | PRN

## 2018-01-02 MED ORDER — FENTANYL CITRATE (PF) 250 MCG/5ML IJ SOLN
INTRAMUSCULAR | Status: AC
  Filled 2018-01-02: qty 5

## 2018-01-02 MED ORDER — ONDANSETRON HCL 4 MG/2ML IJ SOLN
INTRAMUSCULAR | Status: DC | PRN
  Administered 2018-01-02: 4 mg via INTRAVENOUS

## 2018-01-02 MED ORDER — ONDANSETRON 4 MG PO TBDP: 4.0000 mg | ORAL_TABLET | Freq: Four times a day (QID) | ORAL | Status: DC | PRN

## 2018-01-02 MED ORDER — LIDOCAINE 2% (20 MG/ML) 5 ML SYRINGE
INTRAMUSCULAR | Status: AC
  Filled 2018-01-02: qty 5

## 2018-01-02 MED ORDER — SUGAMMADEX SODIUM 500 MG/5ML IV SOLN
INTRAVENOUS | Status: AC
  Filled 2018-01-02: qty 5

## 2018-01-02 MED ORDER — POTASSIUM CHLORIDE IN NACL 20-0.9 MEQ/L-% IV SOLN
INTRAVENOUS | Status: DC
  Administered 2018-01-02 – 2018-01-03 (×3): via INTRAVENOUS
  Filled 2018-01-02 (×6): qty 1000

## 2018-01-02 MED ORDER — BUPIVACAINE-EPINEPHRINE (PF) 0.5% -1:200000 IJ SOLN
INTRAMUSCULAR | Status: DC | PRN
  Administered 2018-01-02: 30 mL

## 2018-01-02 MED ORDER — POTASSIUM CHLORIDE CRYS ER 20 MEQ PO TBCR
40.0000 meq | EXTENDED_RELEASE_TABLET | Freq: Once | ORAL | Status: AC
  Administered 2018-01-02: 40 meq via ORAL
  Filled 2018-01-02: qty 2

## 2018-01-02 MED ORDER — HYDROMORPHONE 1 MG/ML IV SOLN: INTRAVENOUS | Status: DC

## 2018-01-02 MED ORDER — SODIUM CHLORIDE 0.9 % IV SOLN
INTRAVENOUS | Status: DC | PRN
  Administered 2018-01-02 (×2): via INTRAVENOUS

## 2018-01-02 MED ORDER — PROPOFOL 10 MG/ML IV BOLUS
INTRAVENOUS | Status: DC | PRN
  Administered 2018-01-02: 200 mg via INTRAVENOUS

## 2018-01-02 MED ORDER — ONDANSETRON HCL 4 MG/2ML IJ SOLN
INTRAMUSCULAR | Status: AC
Start: 2018-01-02 — End: ?
  Filled 2018-01-02: qty 2

## 2018-01-02 MED ORDER — LIDOCAINE HCL (CARDIAC) PF 100 MG/5ML IV SOSY
PREFILLED_SYRINGE | INTRAVENOUS | Status: DC | PRN
  Administered 2018-01-02: 60 mg via INTRAVENOUS

## 2018-01-02 MED ORDER — DOCUSATE SODIUM 100 MG PO CAPS
100.0000 mg | ORAL_CAPSULE | Freq: Two times a day (BID) | ORAL | Status: DC
  Administered 2018-01-02 – 2018-01-10 (×8): 100 mg via ORAL
  Filled 2018-01-02 (×14): qty 1

## 2018-01-02 MED ORDER — FENTANYL CITRATE (PF) 100 MCG/2ML IJ SOLN
INTRAMUSCULAR | Status: DC | PRN
  Administered 2018-01-02: 100 ug via INTRAVENOUS

## 2018-01-02 MED ORDER — ONDANSETRON HCL 4 MG/2ML IJ SOLN: 4.0000 mg | Freq: Four times a day (QID) | INTRAMUSCULAR | Status: DC | PRN

## 2018-01-02 MED ORDER — METHOCARBAMOL 500 MG PO TABS
500.0000 mg | ORAL_TABLET | Freq: Four times a day (QID) | ORAL | Status: DC | PRN
  Administered 2018-01-02 – 2018-01-07 (×9): 500 mg via ORAL
  Filled 2018-01-02 (×10): qty 1

## 2018-01-02 SURGICAL SUPPLY — 32 items
BNDG GAUZE ELAST 4 BULKY (GAUZE/BANDAGES/DRESSINGS) ×2 IMPLANT
CANISTER SUCT 3000ML PPV (MISCELLANEOUS) ×2 IMPLANT
COVER SURGICAL LIGHT HANDLE (MISCELLANEOUS) ×2 IMPLANT
DRAPE UTILITY XL STRL (DRAPES) ×4 IMPLANT
DRSG PAD ABDOMINAL 8X10 ST (GAUZE/BANDAGES/DRESSINGS) ×2 IMPLANT
ELECT CAUTERY BLADE 6.4 (BLADE) ×2 IMPLANT
ELECT REM PT RETURN 9FT ADLT (ELECTROSURGICAL) ×2
ELECTRODE REM PT RTRN 9FT ADLT (ELECTROSURGICAL) ×1 IMPLANT
GAUZE PACKING IODOFORM 1 (PACKING) IMPLANT
GAUZE SPONGE 4X4 12PLY STRL (GAUZE/BANDAGES/DRESSINGS) ×2 IMPLANT
GLOVE SURG SIGNA 7.5 PF LTX (GLOVE) ×2 IMPLANT
GOWN STRL REUS W/ TWL LRG LVL3 (GOWN DISPOSABLE) ×1 IMPLANT
GOWN STRL REUS W/ TWL XL LVL3 (GOWN DISPOSABLE) ×1 IMPLANT
GOWN STRL REUS W/TWL LRG LVL3 (GOWN DISPOSABLE) ×1
GOWN STRL REUS W/TWL XL LVL3 (GOWN DISPOSABLE) ×1
KIT BASIN OR (CUSTOM PROCEDURE TRAY) ×2 IMPLANT
KIT TURNOVER KIT B (KITS) ×2 IMPLANT
NEEDLE HYPO 25GX1X1/2 BEV (NEEDLE) ×2 IMPLANT
NS IRRIG 1000ML POUR BTL (IV SOLUTION) ×2 IMPLANT
PACK LITHOTOMY IV (CUSTOM PROCEDURE TRAY) ×2 IMPLANT
PAD ARMBOARD 7.5X6 YLW CONV (MISCELLANEOUS) ×2 IMPLANT
PENCIL BUTTON HOLSTER BLD 10FT (ELECTRODE) ×2 IMPLANT
SPONGE LAP 18X18 X RAY DECT (DISPOSABLE) ×2 IMPLANT
SWAB COLLECTION DEVICE MRSA (MISCELLANEOUS) IMPLANT
SWAB CULTURE ESWAB REG 1ML (MISCELLANEOUS) IMPLANT
SYR BULB 3OZ (MISCELLANEOUS) ×2 IMPLANT
SYR CONTROL 10ML LL (SYRINGE) ×2 IMPLANT
TOWEL OR 17X24 6PK STRL BLUE (TOWEL DISPOSABLE) ×2 IMPLANT
TOWEL OR 17X26 10 PK STRL BLUE (TOWEL DISPOSABLE) ×2 IMPLANT
TUBE CONNECTING 12X1/4 (SUCTIONS) ×2 IMPLANT
UNDERPAD 30X30 (UNDERPADS AND DIAPERS) ×2 IMPLANT
YANKAUER SUCT BULB TIP NO VENT (SUCTIONS) ×2 IMPLANT

## 2018-01-02 NOTE — ED Notes (Signed)
To or  Clothes shirt cut off shoes pants necklaxce with corss pennant on in cup with lid.  Will take to him shen he has a bed

## 2018-01-02 NOTE — Anesthesia Procedure Notes (Signed)
Procedure Name: Intubation Date/Time: 01/02/2018 1:17 AM Performed by: Suzy Bouchard, CRNA Pre-anesthesia Checklist: Patient identified, Emergency Drugs available, Suction available, Patient being monitored and Timeout performed Patient Re-evaluated:Patient Re-evaluated prior to induction Oxygen Delivery Method: Circle system utilized Preoxygenation: Pre-oxygenation with 100% oxygen Induction Type: IV induction and Rapid sequence Ventilation: Two handed mask ventilation required, Mask ventilation with difficulty and Oral airway inserted - appropriate to patient size Laryngoscope Size: Glidescope and 3 Grade View: Grade I Tube type: Oral Tube size: 7.5 mm Number of attempts: 1 Airway Equipment and Method: Stylet Placement Confirmation: positive ETCO2,  ETT inserted through vocal cords under direct vision and breath sounds checked- equal and bilateral Secured at: 23 cm Tube secured with: Tape Dental Injury: Teeth and Oropharynx as per pre-operative assessment

## 2018-01-02 NOTE — Progress Notes (Signed)
Belongings retrieved from ED. Placed with patient on bed

## 2018-01-02 NOTE — Transfer of Care (Signed)
Immediate Anesthesia Transfer of Care Note  Patient: Brandon Marks  Procedure(s) Performed: IRRIGATION AND DEBRIDEMENT PERIRECTAL ABSCESS (N/A Rectum)  Patient Location: PACU  Anesthesia Type:General  Level of Consciousness: awake, alert  and oriented  Airway & Oxygen Therapy: Patient Spontanous Breathing and Patient connected to face mask oxygen  Post-op Assessment: Report given to RN and Post -op Vital signs reviewed and stable  Post vital signs: Reviewed and stable  Last Vitals:  Vitals Value Taken Time  BP 100/53 01/02/2018  2:18 AM  Temp    Pulse 86 01/02/2018  2:19 AM  Resp 15 01/02/2018  2:19 AM  SpO2 94 % 01/02/2018  2:19 AM  Vitals shown include unvalidated device data.  Last Pain:  Vitals:   01/01/18 2049  TempSrc:   PainSc: 8          Complications: No apparent anesthesia complications

## 2018-01-02 NOTE — Progress Notes (Signed)
Patient ID: Brandon Marks, male   DOB: 1956-02-25, 62 y.o.   MRN: [id number]    Day of Surgery  Subjective: Pt feels ok this morning.  States his bottom feels numb, not having any pain.  RN states she has changed the ABD pads and they were soaked with serosang output, but not overtly bloody.    Pt states he was just diagnosed with a UTI this week.  His last UTI was 7 months ago.  Unclear why a man has recurrent UTIs.  Patient states his pcp told him he had a lot of protein in his urine.  Pt also is normally hypertensive with BPs that run in the 120/80s with meds.  Objective: Vital signs in last 24 hours: Temp:  [97 F (36.1 C)-98.9 F (37.2 C)] 97.4 F (36.3 C) (06/08 0857) Pulse Rate:  [66-85] 66 (06/08 0857) Resp:  [7-22] 7 (06/08 0351) BP: (70-109)/(34-86) 109/86 (06/08 0857) SpO2:  [95 %-99 %] 96 % (06/08 0857) Weight:  [147 kg (324 lb 1.2 oz)] 147 kg (324 lb 1.2 oz) (06/08 0634) Last BM Date: (PTA)  Intake/Output from previous day: 06/07 0701 - 06/08 0700 In: 3000 [I.V.:2000; IV Piggyback:1000] Out: 75 [Blood:75] Intake/Output this shift: Total I/O In: 360 [P.O.:360] Out: 50 [Urine:50]  PE: Gen: NAD Heart: irregular, but in the 60-70s on monitor. (monitor reads a fib, EKG does not) Lungs: CTAB Abd: soft, obese, NT Rectum: dressing in place, not removing packing today.  No significant drainage as dressing just changed, but RN states previous dressing was saturated with serosang output.  Lab Results:  Recent Labs    01/01/18 1835 01/02/18 0631  WBC 18.5* 24.7*  HGB 14.0 15.0  HCT 40.6 42.9  PLT 356 247   BMET Recent Labs    01/01/18 1835 01/02/18 0631  NA 137 137  K 2.9* 3.4*  CL 98* 105  CO2 27 20*  GLUCOSE 97 100*  BUN 43* 47*  CREATININE 2.14* 1.90*  CALCIUM 8.5* 7.8*   PT/INR No results for input(s): LABPROT, INR in the last 72 hours. CMP     Component Value Date/Time   NA 137 01/02/2018 0631   K 3.4 (L) 01/02/2018 0631   CL 105  01/02/2018 0631   CO2 20 (L) 01/02/2018 0631   GLUCOSE 100 (H) 01/02/2018 0631   BUN 47 (H) 01/02/2018 0631   CREATININE 1.90 (H) 01/02/2018 0631   CREATININE 0.85 08/30/2013 1104   CALCIUM 7.8 (L) 01/02/2018 0631   PROT 6.2 (L) 01/01/2018 1835   ALBUMIN 2.8 (L) 01/01/2018 1835   AST 25 01/01/2018 1835   ALT 20 01/01/2018 1835   ALKPHOS 64 01/01/2018 1835   BILITOT 0.9 01/01/2018 1835   GFRNONAA 36 (L) 01/02/2018 0631   GFRAA 42 (L) 01/02/2018 0631   Lipase     Component Value Date/Time   LIPASE 42 02/27/2012 0811       Studies/Results: Ct Abdomen Pelvis Wo Contrast  Result Date: 01/01/2018 CLINICAL DATA:  Acute onset of rectal pain. Assess for abscess. EXAM: CT ABDOMEN AND PELVIS WITHOUT CONTRAST TECHNIQUE: Multidetector CT imaging of the abdomen and pelvis was performed following the standard protocol without IV contrast. COMPARISON:  MRI of the lumbar spine performed 01/13/2013 FINDINGS: Lower chest: Minimal bibasilar atelectasis is noted. The visualized portions of the mediastinum are unremarkable Hepatobiliary: The liver is unremarkable in appearance. The gallbladder is unremarkable in appearance. The common bile duct remains normal in caliber. Pancreas: The pancreas is within normal limits. Spleen:  The spleen is unremarkable in appearance. Adrenals/Urinary Tract: A 1.7 cm left adrenal adenoma is noted. A 2.0 cm thin right adrenal adenoma is also seen. Nonspecific perinephric stranding is noted bilaterally. There is no evidence of hydronephrosis. A nonobstructing 7 mm stone is noted at the upper pole of the left kidney. The kidneys are otherwise unremarkable. No obstructing ureteral stones are seen. Stomach/Bowel: The patient is status post gastric bypass surgery. The gastrojejunal anastomosis is unremarkable in appearance. The distal stomach is largely decompressed. The small bowel is within normal limits. The appendix is normal in caliber, without evidence of appendicitis. Scattered  diverticulosis is noted along the proximal sigmoid colon. There is leftward displacement of the rectum, reflecting the soft tissue infection along the right side of the pelvis. Vascular/Lymphatic: The abdominal aorta is unremarkable in appearance. The inferior vena cava is grossly unremarkable. No retroperitoneal lymphadenopathy is seen. Mildly prominent right-sided pelvic sidewall nodes likely reflect the adjacent soft tissue infection. Reproductive: The bladder is mildly distended. Mild soft tissue inflammation is noted about the right side of the bladder. The prostate remains normal in size. Other: There is a large abscess containing fluid and air at the right perineal space anterior to the anorectal canal, measuring approximately 7.2 x 3.9 x 6.0 cm. Diffuse soft tissue air tracks along the right side of the gluteal cleft and medial to the right gluteus musculature, extending superiorly to the right of the rectum and along the right pelvic sidewall. A small amount of air tracks superiorly along the right psoas musculature to the level of the kidneys. This pattern is highly suspicious for necrotizing fasciitis. Underlying diffuse soft tissue inflammation is noted. Musculoskeletal: No acute osseous abnormalities are identified. Anterior bridging osteophytes are noted along the lower thoracic and lumbar spine. The visualized musculature is unremarkable in appearance. IMPRESSION: 1. Large abscess containing fluid and air at the right perineal space anterior to the anorectal canal, measuring approximately 7.2 x 3.9 x 6.0 cm. 2. Diffuse soft tissue air noted at the right side of the gluteal cleft and medial to the right gluteus musculature, extending superiorly to the right of the rectum and along the right pelvic sidewall. Small amount of air tracks superiorly along the right psoas musculature to the level of the kidneys. This pattern is highly suspicious for necrotizing fasciitis. Underlying diffuse soft tissue  inflammation noted. 3. Mildly prominent right-sided pelvic sidewall nodes likely reflect the adjacent soft tissue infection. 4. Scattered diverticulosis along the proximal sigmoid colon, without evidence of diverticulitis. 5. Nonobstructing 7 mm stone at the upper pole of the left kidney. 6. Bilateral adrenal adenomas noted. Critical Value/emergent results were called by telephone at the time of interpretation on 01/01/2018 at 11:18 pm to Dr. Nanda Quinton, who verbally acknowledged these results. Electronically Signed   By: Garald Balding M.D.   On: 01/01/2018 23:41    Anti-infectives: Anti-infectives (From admission, onward)   Start     Dose/Rate Route Frequency Ordered Stop   01/02/18 1000  ceFEPIme (MAXIPIME) 2 g in sodium chloride 0.9 % 100 mL IVPB    Note to Pharmacy:  Pharm to dose   2 g 200 mL/hr over 30 Minutes Intravenous Every 12 hours 01/02/18 0340     01/02/18 0600  vancomycin (VANCOCIN) IVPB 1000 mg/200 mL premix     1,000 mg 200 mL/hr over 60 Minutes Intravenous Every 12 hours 01/02/18 0409     01/01/18 2330  ceFEPIme (MAXIPIME) 2 g in sodium chloride 0.9 % 100 mL IVPB  2 g 200 mL/hr over 30 Minutes Intravenous  Once 01/01/18 2321 01/02/18 0038   01/01/18 2315  vancomycin (VANCOCIN) 2,000 mg in sodium chloride 0.9 % 500 mL IVPB     2,000 mg 250 mL/hr over 120 Minutes Intravenous  Once 01/01/18 2313 01/02/18 0328   01/01/18 1945  cefTRIAXone (ROCEPHIN) 1 g in sodium chloride 0.9 % 100 mL IVPB     1 g 200 mL/hr over 30 Minutes Intravenous  Once 01/01/18 1932 01/01/18 2202   01/01/18 1945  metroNIDAZOLE (FLAGYL) IVPB 500 mg     500 mg 100 mL/hr over 60 Minutes Intravenous  Once 01/01/18 1932 01/01/18 2317       Assessment/Plan Sepsis secondary to Necrotizing fasciitis of perianal region, POD 0, s/p debridement of necrotic tissue, Dr. Ninfa Linden, 01-02-18  -leave packing in place for now.  Will likely plan to return to OR on Monday for re-exploration and evaluation of his  wound. -may change outer pads prn for saturation. -on cefepime and vanc.  Prelim cx show gram + and gram - bacteria.  Fungal cultures are pending -blood CX are pending -recheck lactic acid -labs in am  Sinus Arrhythmia  EKG shows sinus arrhythmia and not a fib as monitor shows.  It also states he has a prolongation of his QT interval and some low voltage.  I have asked medicine to evaluate this.  Recent UTI Patient states he was actively being treated for a UTI by his PCP.  He states that this is not his first and that his PCP told him he "has a lot of protein in my urine" -UA pending -medicine to eval if anything further needed as well.  ARI 2.14 on admit, down to 1.9 today.  Baseline appeared to be 1.02 7 months ago -fluids at 125cc/hr.  Will also give a fluid bolus for hypotension as well as renal failure  HTN -patient currently hypotensive, likely secondary to sepsis and dehydration.  Will give fluid bolus and cont IVF hydration. -hold home meds -medicine will evaluate as well   Hyperkalemia 3.4, will replace today, check BMET in am  Morbid obesity -lost 95# after gastric bypass 7 months ago at [facility] - clears, can likely have regular diet VTE - SCDs/Lovenox ID - Cefepime/Vanc 6-7 -->   LOS: 0 days    Henreitta Cea , [facility] Surgery 01/02/2018, [place] AM Pager: [phone]

## 2018-01-02 NOTE — Op Note (Signed)
IRRIGATION AND DEBRIDEMENT PERIRECTAL ABSCESS  Procedure Note  Brandon Marks 01/02/2018   Pre-op Diagnosis: PERIRECTAL ABSCESS, POSSIBLE NECROTIZING FASCITIS     Post-op Diagnosis: PERIRECTAL ABSCESS WITH NECROTIZING FASCIITIS  Procedure(s): INCISION, DRAINAGE, AND SHARP DEBRIDEMENT PERIRECTAL ABSCESS AND NECROTIZING FASCIITIS  Surgeon(s): Coralie Keens, MD  Anesthesia: General  Staff:  Circulator: Carolan Clines, RN Scrub Person: Zannie Kehr Circulator Assistant: Ala Bent, RN  Estimated Blood Loss: less than 100 mL               CULTURES SENT  Findings: The patient was found to have a right-sided perirectal abscess going to the buttocks with necrotizing fasciitis with a large amount of purulence and necrotic fat and muscle.  I sharply debrided approximately 18 cm of subcutaneous tissue, fat, and muscle with a scalpel and the cautery.  Procedure: The patient was brought to the operating room and identified as correct patient.  He was placed supine on the operating room table and general anesthesia was induced.  The patient was then placed in lithotomy position.  His perianal area was prepped and draped in usual sterile fashion.  I made an incision in the area of the right buttock near the rectum with a scalpel and entered necrotic tissue.  I lengthened the incision with a scalpel and identified a large abscess cavity going deep and superior toward the rectum.  There was dead tissue and turbid fluid in the wound.  I sharply debrided 18 cm of subcutaneous tissue, fat, and muscle with the scalpel and electrocautery.  Cultures were obtained of the purulence.  Hemostasis appeared to be achieved with the cautery.  I then packed to large Curlex  into the wound.  The patient appeared to tolerate the procedure.  All counts were correct at the end of the procedure.  The patient was then extubated in the operating room and taken to the recovery room           Brandon Marks A   Date: 01/02/2018  Time: 2:05 AM

## 2018-01-02 NOTE — Anesthesia Postprocedure Evaluation (Signed)
Anesthesia Post Note  Patient: Brandon Marks  Procedure(s) Performed: IRRIGATION AND DEBRIDEMENT PERIRECTAL ABSCESS (N/A Rectum)     Patient location during evaluation: PACU Anesthesia Type: General Level of consciousness: awake and alert Pain management: pain level controlled Vital Signs Assessment: post-procedure vital signs reviewed and stable Respiratory status: spontaneous breathing, nonlabored ventilation, respiratory function stable and patient connected to nasal cannula oxygen Cardiovascular status: blood pressure returned to baseline and stable Postop Assessment: no apparent nausea or vomiting Anesthetic complications: no    Last Vitals:  Vitals:   01/02/18 0315 01/02/18 0351  BP: (!) 81/51 (!) 90/54  Pulse: 78 73  Resp: 13 (!) 7  Temp:  36.9 C  SpO2: 96% 97%    Last Pain:  Vitals:   01/02/18 0351  TempSrc: Oral  PainSc:                  [name],W. EDMOND

## 2018-01-02 NOTE — Progress Notes (Signed)
Pharmacy Antibiotic Note  Brandon Marks is a 62 y.o. male admitted on 01/01/2018 with perirectal abcess/necrotizing fasciitis s/p debridement .  Pharmacy has been consulted for Vancomycin  Dosing.  Vancomycin 1 g IV given pre-op at [place]: Vancomycin 1 g IV q12h F/U renal function   Weight: (!) 324 lb 1.2 oz (147 kg)  Temp (24hrs), Avg:98 F (36.7 C), Min:97 F (36.1 C), Max:98.9 F (37.2 C)  Recent Labs  Lab 01/01/18 1835 01/01/18 1836 01/01/18 2130  WBC 18.5*  --   --   CREATININE 2.14*  --   --   LATICACIDVEN  --  2.61* 3.10*    Estimated Creatinine Clearance: 54 mL/min (A) (by C-G formula based on SCr of 2.14 mg/dL (H)).    Allergies  Allergen Reactions  . Codeine Nausea And Vomiting  . Oxycodone Other (See Comments)    Upset GI    Caryl Pina 01/02/2018 4:06 AM

## 2018-01-02 NOTE — Consult Note (Signed)
Patient Demographics  Brandon Marks, is a 62 y.o. male   MRN: [id number]   DOB - [birth year]  Admit Date - 01/01/2018    Outpatient Primary MD for the patient is Tamsen Roers, MD  Consult requested in the Hospital by [facility], [place], MD, On 01/02/2018    Reason for consult sinus arrhythmia, pac's, hypotension, lactic acidosis, aki   With History of -  Past Medical History:  Diagnosis Date  . Arthritis   . Herniated disc   . High cholesterol   . Hypertension   . Morbid obesity ([place])   . PAC (premature atrial contraction)   . Sleep apnea    non compliant with c-pap      Past Surgical History:  Procedure Laterality Date  . CARDIAC CATHETERIZATION  02/20/2009   normal coronary arteries  . KNEE ARTHROSCOPY  2010   Lt  . NM MYOCAR PERF WALL MOTION  09/28/2007   small area of reversibility in the anterolateral wall at the apex concerning for ischemia  . SHOULDER ARTHROSCOPY  4/12   Rt    in for Necrotizing fasciitis of perianal region    No chief complaint on file.    HPI  Brandon Marks  is a 62 y.o. male, with h/o osa on cpap, h/o  morbid obesity s/p gastric bypass surgery 20months ago, h/o HTN, admitted to the hospital due to Necrotizing fasciitis of perianal region, he is s/p debridement of necrotic tissue 6/8 am. On 6/9, he has persistent borderline hypotension, sinus arrhythmia with pac's and pvc's on tele, qtc mildly prolonged at 480, he has significant leukocytosis, lactic acidosis, aki, hospitalist consulted for management of medical issues.     Review of Systems    In addition to the HPI above,  No Fever-chills, No Headache, No changes with Vision or hearing, No problems swallowing food or Liquids, No Chest pain, Cough or Shortness of Breath, No Abdominal pain, No Nausea or Vommitting, Bowel movements are regular, No Blood in stool or Urine, No dysuria, No new skin rashes or  bruises, No new joints pains-aches,  No new weakness, tingling, numbness in any extremity, No recent weight gain or loss, No polyuria, polydypsia or polyphagia, No significant Mental Stressors.  A full 10 point Review of Systems was done, except as stated above, all other Review of Systems were negative.   Social History Social History   Tobacco Use  . Smoking status: Never Smoker  . Smokeless tobacco: Never Used  Substance Use Topics  . Alcohol use: No     Family History Family History  Problem Relation Age of Onset  . Hypertension Mother   . Heart failure Mother   . Heart failure Father   . Alzheimer's disease Maternal Grandmother   . Alzheimer's disease Maternal Grandfather      Prior to Admission medications   Medication Sig Start Date End Date Taking? Authorizing Provider  levocetirizine (XYZAL) 5 MG tablet Take 5 mg by mouth daily. 12/29/17  Yes [provider]  lisinopril-hydrochlorothiazide (PRINZIDE,ZESTORETIC) 20-25 MG tablet Take 1 tablet by mouth daily. 12/19/16  Yes Almyra Deforest, PA  lovastatin (MEVACOR) 40 MG tablet Take 1 tablet (40 mg total) by mouth at bedtime. 12/19/16  Yes Almyra Deforest, PA  omeprazole (PRILOSEC) 20 MG capsule Take 1 capsule by mouth daily. 11/26/17  Yes [provider]  predniSONE (DELTASONE) 5 MG tablet Take by mouth See admin instructions. TAKE 6 TABLETS BY MOUTH DAILY FOR 2 DAYS THEN 5 TABS DAILY FOR 2 DAYS THEN 4 TABS DAILY FOR 2 DAYS THEN 3 TABS DAILY FOR 2 DAYS THEN 2 TABS THEN 1 TAB DAILY FOR 2 DAYS 12/29/17  Yes [provider]    Anti-infectives (From admission, onward)   Start     Dose/Rate Route Frequency Ordered Stop   01/02/18 1000  ceFEPIme (MAXIPIME) 2 g in sodium chloride 0.9 % 100 mL IVPB    Note to Pharmacy:  Pharm to dose   2 g 200 mL/hr over 30 Minutes Intravenous Every 12 hours 01/02/18 0340     01/02/18 0600  vancomycin (VANCOCIN) IVPB 1000 mg/200 mL premix     1,000 mg 200 mL/hr over 60 Minutes  Intravenous Every 12 hours 01/02/18 0409     01/01/18 2330  ceFEPIme (MAXIPIME) 2 g in sodium chloride 0.9 % 100 mL IVPB     2 g 200 mL/hr over 30 Minutes Intravenous  Once 01/01/18 2321 01/02/18 0038   01/01/18 2315  vancomycin (VANCOCIN) 2,000 mg in sodium chloride 0.9 % 500 mL IVPB     2,000 mg 250 mL/hr over 120 Minutes Intravenous  Once 01/01/18 2313 01/02/18 0328   01/01/18 1945  cefTRIAXone (ROCEPHIN) 1 g in sodium chloride 0.9 % 100 mL IVPB     1 g 200 mL/hr over 30 Minutes Intravenous  Once 01/01/18 1932 01/01/18 2202   01/01/18 1945  metroNIDAZOLE (FLAGYL) IVPB 500 mg     500 mg 100 mL/hr over 60 Minutes Intravenous  Once 01/01/18 1932 01/01/18 2317      Scheduled Meds: . docusate sodium  100 mg Oral BID  . [START ON 01/03/2018] enoxaparin (LOVENOX) injection  40 mg Subcutaneous Q24H   Continuous Infusions: . 0.9 % NaCl with KCl 20 mEq / L 125 mL/hr at 01/02/18 0612  . ceFEPime (MAXIPIME) IV    . sodium chloride    . vancomycin Stopped (01/02/18 1046)   PRN Meds:.methocarbamol, morphine injection, ondansetron **OR** ondansetron (ZOFRAN) IV  Allergies  Allergen Reactions  . Codeine Nausea And Vomiting  . Oxycodone Other (See Comments)    Upset GI    Physical Exam  Vitals  Blood pressure 109/86, pulse 66, temperature (!) 97.4 F (36.3 C), temperature source Axillary, resp. rate (!) 7, weight (!) 147 kg (324 lb 1.2 oz), SpO2 96 %.   1. General , lying in bed in NAD,  Denies pain  2. Normal affect and insight, Not Suicidal or Homicidal, Awake Alert, Oriented X 3.  3. No F.N deficits, ALL C.Nerves Intact, Strength 5/5 all 4 extremities, Sensation intact all 4 extremities, Plantars down going.  4. Ears and Eyes appear Normal, Conjunctivae clear, PERRLA. Moist Oral Mucosa.  5. Supple Neck, No JVD, No cervical lymphadenopathy appriciated, No Carotid Bruits.  6. Symmetrical Chest wall movement, Good air movement bilaterally, CTAB.  7. RRR, No Gallops, Rubs or  Murmurs, No Parasternal Heave.  8. Positive Bowel Sounds, Abdomen Soft, No tenderness, No organomegaly appriciated,No rebound -guarding or rigidity.  9.  No Cyanosis, Normal Skin  Turgor, No Skin Rash or Bruise.  10. Good muscle tone,  joints appear normal , no effusions, Normal ROM.  11. No Palpable Lymph Nodes in Neck or Axillae    Data Review  CBC Recent Labs  Lab 01/01/18 1835 01/02/18 0631  WBC 18.5* 24.7*  HGB 14.0 15.0  HCT 40.6 42.9  PLT 356 247  MCV 85.7 85.0  MCH 29.5 29.7  MCHC 34.5 35.0  RDW 14.2 14.5  LYMPHSABS 0.7  --   MONOABS 0.6  --   EOSABS 0.0  --   BASOSABS 0.2*  --    ------------------------------------------------------------------------------------------------------------------  Chemistries  Recent Labs  Lab 01/01/18 1835 01/02/18 0631  NA 137 137  K 2.9* 3.4*  CL 98* 105  CO2 27 20*  GLUCOSE 97 100*  BUN 43* 47*  CREATININE 2.14* 1.90*  CALCIUM 8.5* 7.8*  AST 25  --   ALT 20  --   ALKPHOS 64  --   BILITOT 0.9  --    ------------------------------------------------------------------------------------------------------------------ estimated creatinine clearance is 60.8 mL/min (A) (by C-G formula based on SCr of 1.9 mg/dL (H)). ------------------------------------------------------------------------------------------------------------------ No results for input(s): TSH, T4TOTAL, T3FREE, THYROIDAB in the last 72 hours.  Invalid input(s): FREET3   Coagulation profile No results for input(s): INR, PROTIME in the last 168 hours. ------------------------------------------------------------------------------------------------------------------- No results for input(s): DDIMER in the last 72 hours. -------------------------------------------------------------------------------------------------------------------  Cardiac Enzymes No results for input(s): CKMB, TROPONINI, MYOGLOBIN in the last 168 hours.  Invalid input(s):  CK ------------------------------------------------------------------------------------------------------------------ Invalid input(s): POCBNP   ---------------------------------------------------------------------------------------------------------------  Urinalysis    Component Value Date/Time   COLORURINE AMBER (A) 05/21/2017 1136   APPEARANCEUR CLEAR 05/21/2017 1136   LABSPEC 1.014 05/21/2017 1136   PHURINE 6.0 05/21/2017 1136   GLUCOSEU NEGATIVE 05/21/2017 1136   HGBUR MODERATE (A) 05/21/2017 1136   BILIRUBINUR NEGATIVE 05/21/2017 1136   KETONESUR NEGATIVE 05/21/2017 1136   PROTEINUR 30 (A) 05/21/2017 1136   UROBILINOGEN 0.2 03/29/2017 1643   NITRITE POSITIVE (A) 05/21/2017 1136   LEUKOCYTESUR SMALL (A) 05/21/2017 1136     Imaging results:   Ct Abdomen Pelvis Wo Contrast  Result Date: 01/01/2018 CLINICAL DATA:  Acute onset of rectal pain. Assess for abscess. EXAM: CT ABDOMEN AND PELVIS WITHOUT CONTRAST TECHNIQUE: Multidetector CT imaging of the abdomen and pelvis was performed following the standard protocol without IV contrast. COMPARISON:  MRI of the lumbar spine performed 01/13/2013 FINDINGS: Lower chest: Minimal bibasilar atelectasis is noted. The visualized portions of the mediastinum are unremarkable Hepatobiliary: The liver is unremarkable in appearance. The gallbladder is unremarkable in appearance. The common bile duct remains normal in caliber. Pancreas: The pancreas is within normal limits. Spleen: The spleen is unremarkable in appearance. Adrenals/Urinary Tract: A 1.7 cm left adrenal adenoma is noted. A 2.0 cm thin right adrenal adenoma is also seen. Nonspecific perinephric stranding is noted bilaterally. There is no evidence of hydronephrosis. A nonobstructing 7 mm stone is noted at the upper pole of the left kidney. The kidneys are otherwise unremarkable. No obstructing ureteral stones are seen. Stomach/Bowel: The patient is status post gastric bypass surgery. The  gastrojejunal anastomosis is unremarkable in appearance. The distal stomach is largely decompressed. The small bowel is within normal limits. The appendix is normal in caliber, without evidence of appendicitis. Scattered diverticulosis is noted along the proximal sigmoid colon. There is leftward displacement of the rectum, reflecting the soft tissue infection along the right side of the pelvis. Vascular/Lymphatic: The abdominal aorta is unremarkable in appearance. The inferior vena cava is  grossly unremarkable. No retroperitoneal lymphadenopathy is seen. Mildly prominent right-sided pelvic sidewall nodes likely reflect the adjacent soft tissue infection. Reproductive: The bladder is mildly distended. Mild soft tissue inflammation is noted about the right side of the bladder. The prostate remains normal in size. Other: There is a large abscess containing fluid and air at the right perineal space anterior to the anorectal canal, measuring approximately 7.2 x 3.9 x 6.0 cm. Diffuse soft tissue air tracks along the right side of the gluteal cleft and medial to the right gluteus musculature, extending superiorly to the right of the rectum and along the right pelvic sidewall. A small amount of air tracks superiorly along the right psoas musculature to the level of the kidneys. This pattern is highly suspicious for necrotizing fasciitis. Underlying diffuse soft tissue inflammation is noted. Musculoskeletal: No acute osseous abnormalities are identified. Anterior bridging osteophytes are noted along the lower thoracic and lumbar spine. The visualized musculature is unremarkable in appearance. IMPRESSION: 1. Large abscess containing fluid and air at the right perineal space anterior to the anorectal canal, measuring approximately 7.2 x 3.9 x 6.0 cm. 2. Diffuse soft tissue air noted at the right side of the gluteal cleft and medial to the right gluteus musculature, extending superiorly to the right of the rectum and along the  right pelvic sidewall. Small amount of air tracks superiorly along the right psoas musculature to the level of the kidneys. This pattern is highly suspicious for necrotizing fasciitis. Underlying diffuse soft tissue inflammation noted. 3. Mildly prominent right-sided pelvic sidewall nodes likely reflect the adjacent soft tissue infection. 4. Scattered diverticulosis along the proximal sigmoid colon, without evidence of diverticulitis. 5. Nonobstructing 7 mm stone at the upper pole of the left kidney. 6. Bilateral adrenal adenomas noted. Critical Value/emergent results were called by telephone at the time of interpretation on 01/01/2018 at 11:18 pm to Dr. Nanda Quinton, who verbally acknowledged these results. Electronically Signed   By: Garald Balding M.D.   On: 01/01/2018 23:41    My personal review of EKG: Sinus arrhythmia, pac's and pvc's,  QTc 481 , no Acute ST changes    Assessment & Plan  Active Problems:   Necrotizing fasciitis ([place])    Sepsis secondary to Necrotizing fasciitis of perianal region, POD 0, s/p debridement of necrotic tissue He is on abx, culture pending, management per general surgery.  H/o HTN, now borderline bp Likely from sepsis ,currently patient is nontoxic appearing Continue ivf, abx, hold home bp meds Expect improvement with treating sepsis   Sinus arrhythmia, qtc 481,  -He denies chest pain, no sob, no dizziness -keep on tele, keep k>4, mag >2,  -I have review chart, he has unremarkable cardiac stress test in 12/2016. Will not order echo. Will check tsh. Repeat ekg in am to monitor Qtc.  AKI on ckdII -Cr baseline 1.02, cr 2.14 on presentation -ua + bacteria, urine culture pending collection -he is already on abx, hold home meds lisinopril/hctz, on hydration -Repeat bmp in am -renal dosing meds   Morbid obesity:  s/p after gastric bypass 7 months ago at [facility] Body mass index is 41.61 kg/m.  osa report dose not use cpap consistently   DVT  Prophylaxis:  Lovenox -   AM Labs Ordered, also please review Full Orders  Family Communication: Plan discussed with patient      Florencia Reasons MD PhD on 01/02/2018 at 11:43 AM  Between 7am to 7pm - Pager - [phone]  After 7pm go to  [email or web address] - password [email or web address]   Thank you for the consult, we will follow the patient with you in the [facility] Office  [phone]

## 2018-01-02 NOTE — Progress Notes (Signed)
Patient dressing changed twice this shift (ABD pads only).  AM change, pads were saturated and leaking onto bed pad.  Evening change was shadowed with moderate drainage.

## 2018-01-02 NOTE — Anesthesia Preprocedure Evaluation (Addendum)
Anesthesia Evaluation  Patient identified by MRN, date of birth, ID band Patient awake    Reviewed: Allergy & Precautions, H&P , NPO status , Patient's Chart, lab work & pertinent test results  Airway Mallampati: II  TM Distance: >3 FB Neck ROM: Full    Dental no notable dental hx. (+) Teeth Intact, Dental Advisory Given   Pulmonary shortness of breath, sleep apnea ,    Pulmonary exam normal breath sounds clear to auscultation       Cardiovascular hypertension, Pt. on medications  Rhythm:Regular Rate:Normal     Neuro/Psych negative neurological ROS  negative psych ROS   GI/Hepatic negative GI ROS, Neg liver ROS,   Endo/Other  Morbid obesity  Renal/GU negative Renal ROS  negative genitourinary   Musculoskeletal  (+) Arthritis , Osteoarthritis,    Abdominal   Peds  Hematology negative hematology ROS (+)   Anesthesia Other Findings S/p recent gastric bypass surgery at [facility]  Reproductive/Obstetrics negative OB ROS                          Anesthesia Physical Anesthesia Plan  ASA: III and emergent  Anesthesia Plan: General   Post-op Pain Management:    Induction: Intravenous  PONV Risk Score and Plan: 3 and Ondansetron, Midazolam and Treatment may vary due to age or medical condition  Airway Management Planned: Oral ETT  Additional Equipment:   Intra-op Plan:   Post-operative Plan: Extubation in OR  Informed Consent: I have reviewed the patients History and Physical, chart, labs and discussed the procedure including the risks, benefits and alternatives for the proposed anesthesia with the patient or authorized representative who has indicated his/her understanding and acceptance.   Dental advisory given  Plan Discussed with: CRNA  Anesthesia Plan Comments:         Anesthesia Quick Evaluation

## 2018-01-03 ENCOUNTER — Encounter (HOSPITAL_COMMUNITY): Payer: Self-pay | Admitting: Surgery

## 2018-01-03 LAB — CBC
HEMATOCRIT: 33.9 % — AB (ref 39.0–52.0)
HEMOGLOBIN: 11.7 g/dL — AB (ref 13.0–17.0)
MCH: 29.5 pg (ref 26.0–34.0)
MCHC: 34.5 g/dL (ref 30.0–36.0)
MCV: 85.6 fL (ref 78.0–100.0)
Platelets: 342 10*3/uL (ref 150–400)
RBC: 3.96 MIL/uL — ABNORMAL LOW (ref 4.22–5.81)
RDW: 14.5 % (ref 11.5–15.5)
WBC: 18.8 10*3/uL — ABNORMAL HIGH (ref 4.0–10.5)

## 2018-01-03 LAB — BASIC METABOLIC PANEL WITH GFR
Anion gap: 9 (ref 5–15)
BUN: 41 mg/dL — ABNORMAL HIGH (ref 6–20)
CO2: 23 mmol/L (ref 22–32)
Calcium: 7.9 mg/dL — ABNORMAL LOW (ref 8.9–10.3)
Chloride: 107 mmol/L (ref 101–111)
Creatinine, Ser: 1.56 mg/dL — ABNORMAL HIGH (ref 0.61–1.24)
GFR calc Af Amer: 53 mL/min — ABNORMAL LOW
GFR calc non Af Amer: 46 mL/min — ABNORMAL LOW
Glucose, Bld: 90 mg/dL (ref 65–99)
Potassium: 3.1 mmol/L — ABNORMAL LOW (ref 3.5–5.1)
Sodium: 139 mmol/L (ref 135–145)

## 2018-01-03 LAB — TSH: TSH: 0.949 u[IU]/mL (ref 0.350–4.500)

## 2018-01-03 LAB — MAGNESIUM: Magnesium: 1.5 mg/dL — ABNORMAL LOW (ref 1.7–2.4)

## 2018-01-03 MED ORDER — METRONIDAZOLE IN NACL 5-0.79 MG/ML-% IV SOLN
500.0000 mg | Freq: Three times a day (TID) | INTRAVENOUS | Status: DC
  Administered 2018-01-03 – 2018-01-06 (×10): 500 mg via INTRAVENOUS
  Filled 2018-01-03 (×9): qty 100

## 2018-01-03 MED ORDER — POTASSIUM CHLORIDE CRYS ER 20 MEQ PO TBCR
40.0000 meq | EXTENDED_RELEASE_TABLET | Freq: Once | ORAL | Status: AC
  Administered 2018-01-03: 40 meq via ORAL
  Filled 2018-01-03: qty 2

## 2018-01-03 MED ORDER — MAGNESIUM SULFATE 2 GM/50ML IV SOLN
2.0000 g | Freq: Once | INTRAVENOUS | Status: AC
  Administered 2018-01-03: 2 g via INTRAVENOUS
  Filled 2018-01-03: qty 50

## 2018-01-03 NOTE — Progress Notes (Signed)
Patient ID: CORTLAND CREHAN, male   DOB: 25-Jul-1956, 62 y.o.   MRN: [id number]    1 Day Post-Op  Subjective: Patient is frustrated today.  Complains of more pain today than yesterday.  Refuses to wear heart monitor.    Objective: Vital signs in last 24 hours: Temp:  [97.6 F (36.4 C)-99 F (37.2 C)] 98.4 F (36.9 C) (06/09 0423) Pulse Rate:  [69-85] 69 (06/09 0423) Resp:  [18-20] 18 (06/09 0030) BP: (117-138)/(56-75) 128/57 (06/09 0423) SpO2:  [96 %-99 %] 96 % (06/09 0423) Last BM Date: (PTA)  Intake/Output from previous day: 06/08 0701 - 06/09 0700 In: 2125 [P.O.:600; I.V.:1225; IV Piggyback:300] Out: 419 [Urine:875] Intake/Output this shift: No intake/output data recorded.  PE: Heart: sounds more regular today Lungs: CTAB Abd: soft, NT, obese GU: patient sitting up on backside and unable to examine backside.  RN note states outer dressing was changed again overnight.  Lab Results:  Recent Labs    01/02/18 0631 01/03/18 0154  WBC 24.7* 18.8*  HGB 15.0 11.7*  HCT 42.9 33.9*  PLT 247 342   BMET Recent Labs    01/02/18 0631 01/03/18 0154  NA 137 139  K 3.4* 3.1*  CL 105 107  CO2 20* 23  GLUCOSE 100* 90  BUN 47* 41*  CREATININE 1.90* 1.56*  CALCIUM 7.8* 7.9*   PT/INR No results for input(s): LABPROT, INR in the last 72 hours. CMP     Component Value Date/Time   NA 139 01/03/2018 0154   K 3.1 (L) 01/03/2018 0154   CL 107 01/03/2018 0154   CO2 23 01/03/2018 0154   GLUCOSE 90 01/03/2018 0154   BUN 41 (H) 01/03/2018 0154   CREATININE 1.56 (H) 01/03/2018 0154   CREATININE 0.85 08/30/2013 1104   CALCIUM 7.9 (L) 01/03/2018 0154   PROT 6.2 (L) 01/01/2018 1835   ALBUMIN 2.8 (L) 01/01/2018 1835   AST 25 01/01/2018 1835   ALT 20 01/01/2018 1835   ALKPHOS 64 01/01/2018 1835   BILITOT 0.9 01/01/2018 1835   GFRNONAA 46 (L) 01/03/2018 0154   GFRAA 53 (L) 01/03/2018 0154   Lipase     Component Value Date/Time   LIPASE 42 02/27/2012 0811        Studies/Results: Ct Abdomen Pelvis Wo Contrast  Result Date: 01/01/2018 CLINICAL DATA:  Acute onset of rectal pain. Assess for abscess. EXAM: CT ABDOMEN AND PELVIS WITHOUT CONTRAST TECHNIQUE: Multidetector CT imaging of the abdomen and pelvis was performed following the standard protocol without IV contrast. COMPARISON:  MRI of the lumbar spine performed 01/13/2013 FINDINGS: Lower chest: Minimal bibasilar atelectasis is noted. The visualized portions of the mediastinum are unremarkable Hepatobiliary: The liver is unremarkable in appearance. The gallbladder is unremarkable in appearance. The common bile duct remains normal in caliber. Pancreas: The pancreas is within normal limits. Spleen: The spleen is unremarkable in appearance. Adrenals/Urinary Tract: A 1.7 cm left adrenal adenoma is noted. A 2.0 cm thin right adrenal adenoma is also seen. Nonspecific perinephric stranding is noted bilaterally. There is no evidence of hydronephrosis. A nonobstructing 7 mm stone is noted at the upper pole of the left kidney. The kidneys are otherwise unremarkable. No obstructing ureteral stones are seen. Stomach/Bowel: The patient is status post gastric bypass surgery. The gastrojejunal anastomosis is unremarkable in appearance. The distal stomach is largely decompressed. The small bowel is within normal limits. The appendix is normal in caliber, without evidence of appendicitis. Scattered diverticulosis is noted along the proximal sigmoid colon. There is  leftward displacement of the rectum, reflecting the soft tissue infection along the right side of the pelvis. Vascular/Lymphatic: The abdominal aorta is unremarkable in appearance. The inferior vena cava is grossly unremarkable. No retroperitoneal lymphadenopathy is seen. Mildly prominent right-sided pelvic sidewall nodes likely reflect the adjacent soft tissue infection. Reproductive: The bladder is mildly distended. Mild soft tissue inflammation is noted about the  right side of the bladder. The prostate remains normal in size. Other: There is a large abscess containing fluid and air at the right perineal space anterior to the anorectal canal, measuring approximately 7.2 x 3.9 x 6.0 cm. Diffuse soft tissue air tracks along the right side of the gluteal cleft and medial to the right gluteus musculature, extending superiorly to the right of the rectum and along the right pelvic sidewall. A small amount of air tracks superiorly along the right psoas musculature to the level of the kidneys. This pattern is highly suspicious for necrotizing fasciitis. Underlying diffuse soft tissue inflammation is noted. Musculoskeletal: No acute osseous abnormalities are identified. Anterior bridging osteophytes are noted along the lower thoracic and lumbar spine. The visualized musculature is unremarkable in appearance. IMPRESSION: 1. Large abscess containing fluid and air at the right perineal space anterior to the anorectal canal, measuring approximately 7.2 x 3.9 x 6.0 cm. 2. Diffuse soft tissue air noted at the right side of the gluteal cleft and medial to the right gluteus musculature, extending superiorly to the right of the rectum and along the right pelvic sidewall. Small amount of air tracks superiorly along the right psoas musculature to the level of the kidneys. This pattern is highly suspicious for necrotizing fasciitis. Underlying diffuse soft tissue inflammation noted. 3. Mildly prominent right-sided pelvic sidewall nodes likely reflect the adjacent soft tissue infection. 4. Scattered diverticulosis along the proximal sigmoid colon, without evidence of diverticulitis. 5. Nonobstructing 7 mm stone at the upper pole of the left kidney. 6. Bilateral adrenal adenomas noted. Critical Value/emergent results were called by telephone at the time of interpretation on 01/01/2018 at 11:18 pm to Dr. Nanda Quinton, who verbally acknowledged these results. Electronically Signed   By: Garald Balding  M.D.   On: 01/01/2018 23:41    Anti-infectives: Anti-infectives (From admission, onward)   Start     Dose/Rate Route Frequency Ordered Stop   01/02/18 1000  ceFEPIme (MAXIPIME) 2 g in sodium chloride 0.9 % 100 mL IVPB    Note to Pharmacy:  Pharm to dose   2 g 200 mL/hr over 30 Minutes Intravenous Every 12 hours 01/02/18 0340     01/02/18 0600  vancomycin (VANCOCIN) IVPB 1000 mg/200 mL premix     1,000 mg 200 mL/hr over 60 Minutes Intravenous Every 12 hours 01/02/18 0409     01/01/18 2330  ceFEPIme (MAXIPIME) 2 g in sodium chloride 0.9 % 100 mL IVPB     2 g 200 mL/hr over 30 Minutes Intravenous  Once 01/01/18 2321 01/02/18 0038   01/01/18 2315  vancomycin (VANCOCIN) 2,000 mg in sodium chloride 0.9 % 500 mL IVPB     2,000 mg 250 mL/hr over 120 Minutes Intravenous  Once 01/01/18 2313 01/02/18 0328   01/01/18 1945  cefTRIAXone (ROCEPHIN) 1 g in sodium chloride 0.9 % 100 mL IVPB     1 g 200 mL/hr over 30 Minutes Intravenous  Once 01/01/18 1932 01/01/18 2202   01/01/18 1945  metroNIDAZOLE (FLAGYL) IVPB 500 mg     500 mg 100 mL/hr over 60 Minutes Intravenous  Once 01/01/18 1932  01/01/18 2317       Assessment/Plan Sepsis secondary to Necrotizing fasciitis of perianal region, POD 1, s/p debridement of necrotic tissue, Dr. Ninfa Linden, 01-02-18  -leave packing in place for now.  Will plan to return to OR on Monday for re-exploration and evaluation of his wound. -may change outer pads prn for saturation. -on cefepime and vanc.  Prelim cx show gram + and gram - bacteria.  Fungal cultures are pending -blood CX are pending, NGTD -follow labs  Sinus Arrhythmia  -appreciate medicine's assistance -patient refuses to wear monitor leads  Recent UTI -UA + -urine cx pending  ARI 2.14 on admit, down to 1.56 today.  Baseline appeared to be 1.02 7 months ago -fluids at 125cc/hr.   -appreciate medicine assistance  HTN -patient currently with intermittent hypotension. Cont hydration and as  infection improves, this should improve.  Hyperkalemia/hypomagnesemia  -K is 3.1, being replaced by medicine Mag is 1.5.  Being replaced by medicine  Morbid obesity -lost 95# after gastric bypass 4 months ago at [facility] - regular diet, NPO p MN for OR tomorrow VTE - SCDs/Lovenox ID - Cefepime/Vanc 6-7 -->   LOS: 1 day    Henreitta Cea , [facility] 01/03/2018, 9:35 AM Pager: [phone]

## 2018-01-03 NOTE — Progress Notes (Addendum)
PROGRESS NOTE    Brandon Marks  PJK:[id number] DOB: September 06, 1955 DOA: 01/01/2018 PCP: Brandon Roers, MD      Brief Narrative:  Brandon Marks is a 62 y.o. M with hx obesity s/p gastric bypass, HTN and OSA on CPAP who presents with perianal pain, found to have sepsis from peri-rectal abscess, also crepitus, on surgical debridement, found to necrotizing fasciitis.   Assessment & Plan:  Acute prerenal kidney injury This is improving today.  CT shows no obstructive uropathy. Likely some component of pyelonephritis as well. -Trend BMP -Continue IVF -Avoid NSAIDs, diuretics -Hold BP meds, avoid hypotension   Sinus arrhythmia This is a long-standing issue for the patient.  Is clinically insignificant.  Telemetry monitoring not necessary.  Abscence of QT interval prolongation There was a question of QT prolongation on his ECG last night, but review of this shows normal QT interval, likely automatic read was distorted by his PVC or the impressive sinus arrhythmia. Confirmed on ECG this morning, personally reviewed, shows normal QT interval, mild sinus arrhyhtmia, no ST changes.  Tele overnight without Afib, VT or other arrhythmias. -Telemetry not necessary from standpoint of arrhythmia or QT interval  Hypokalemia Hypomagnesemia -Continue IVF with K -Supplement Mag -Repeat BMP tomorrow  Doubt recurrent UTI Patient was diagnosed with "UTI" at urgent care back in Sep 2018, treated with Cephalexin, symptoms resolved, then returned, seen in ER:  "Normal-appearing penis scrotum and testicles except for the right hemiscrotum which is slightly red, the testicle was enlarged and tender, no lymphadenopathy in the right groin, no penile discharge at the urethral meatus, foreskin is easily reduced   Stat ultrasound ordered to rule out torsion, consider epididymal orchitis as a likely answer.  Patient will be given pain medicine, IV antibiotics, urinalysis reveals too numerous to count white and red  blood cells with nitrite positive.  Discussed with Dr. Alyson Marks, he is aware of the findings and agrees with antibiotics for what could be a abscess or a cyst.  The patient is aware of this as well, there is a leukocytosis of 14,000, no renal dysfunction, urinary culture has been ordered, ultrasound shows complex cyst which could be an abscess.  Stable for discharge on Levaquin, patient expressed understanding.  No hypotension."  Patient doesn't remember getting Urology follow up.  Sounds like this was an epididymitis.  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  Any stranding on his CT on admission I have to assume is related to his rectal abscess, not a primary UTI.    Necrotizing Fasciitis of perirectal abscess Sepsis Sepsis physiology appears resolved.  -Continue vancomycin and Cefepime -Follow intra-op cultures -Follow urine culture -Add Flagyl          DVT prophylaxis: Lovenox Code Status: FULL Family Communication: None present MDM and disposition Plan: The below labs and imaging reports were reviewed and summarized above.    The patient was admitted with perirectal abscess and nec fasc, a lifethreatening infection, with sepsis on admission.  He has been debrided once, general surgery are following closely, considering further debridement.  In the meantime he is on vancomycin, cefepime and will add Flagyl.  His renal function is improving today, we will monitor this closely, this appears to be a prerenal process, may be of some pyelonephritis involved, either way will be resolving we suspect.   Procedures:   Incision, drainage, sharp debridement perirectal abscess  and necrotizing fasciitis 6/8  Antimicrobials:   Vancomycin 6/7 >>  Cefepime 6/7 >>  Flagyl 6/7 x1 then 6/9 >>  Cuilture:   Blood Cx 6/7: NG  Urine Cx 6/8:  NG  Operative abscess culture 6/8: Gram stain positive     Subjective: Feels pain in rectum.  No dizziness, fever, malaise, weakness, confusion.  Dysuria and urinary frequency noted.  Back pain noted.  Objective: Vitals:   01/02/18 1605 01/02/18 1947 01/03/18 0030 01/03/18 0423  BP: 138/75 (!) 128/56 (!) 117/59 (!) 128/57  Pulse: 73 85 80 69  Resp:  20 18   Temp: 99 F (37.2 C) 98.6 F (37 C) 99 F (37.2 C) 98.4 F (36.9 C)  TempSrc: Axillary Oral Oral Oral  SpO2: 99% 96% 97% 96%  Weight:        Intake/Output Summary (Last 24 hours) at 01/03/2018 0939 Last data filed at 01/03/2018 0400 Gross per 24 hour  Intake 1340 ml  Output 825 ml  Net 515 ml   Filed Weights   01/02/18 0351 01/02/18 0634  Weight: (!) 147 kg (324 lb 1.2 oz) (!) 147 kg (324 lb 1.2 oz)    Examination: General appearance: Obese adult male, alert and in no acute distress.  Standing at bedside, then in bed HEENT: Anicteric, conjunctiva pink, lids and lashes normal. No nasal deformity, discharge, epistaxis.  Lips moist, teeth normal, OP moist, no oral lesions, hearing normal.   Skin: Warm and dry.  No jaundice.  No suspicious rashes or lesions.  Wound not examined. Cardiac: RRR, nl S1-S2, no murmurs appreciated.  Capillary refill is brisk.  JVP not visible.  No LE edema.  Radial pulses 2+ and symmetric. Respiratory: Normal respiratory rate and rhythm.  CTAB without rales or wheezes. Abdomen: Abdomen soft.  No TTP. No ascites, distension, hepatosplenomegaly.   No CVA tednerness. MSK: No deformities or effusions of large joints of upper or lower extremities bilatearlly. Neuro: Awake and alert.  EOMI, moves all extremities. Speech fluent.    Psych: Sensorium intact and responding to questions, attention normal. Affect normal.  Judgment and insight appear normal.    Data Reviewed: I have personally reviewed following labs and imaging studies:  CBC: Recent Labs  Lab 01/01/18 1835 01/02/18 0631  01/03/18 0154  WBC 18.5* 24.7* 18.8*  NEUTROABS 17.0*  --   --   HGB 14.0 15.0 11.7*  HCT 40.6 42.9 33.9*  MCV 85.7 85.0 85.6  PLT 356 247 810   Basic Metabolic Panel: Recent Labs  Lab 01/01/18 1835 01/02/18 0631 01/03/18 0154  NA 137 137 139  K 2.9* 3.4* 3.1*  CL 98* 105 107  CO2 27 20* 23  GLUCOSE 97 100* 90  BUN 43* 47* 41*  CREATININE 2.14* 1.90* 1.56*  CALCIUM 8.5* 7.8* 7.9*  MG  --   --  1.5*   GFR: Estimated Creatinine Clearance: 74.1 mL/min (A) (by C-G formula based on SCr of 1.56 mg/dL (H)). Liver Function Tests: Recent Labs  Lab 01/01/18 1835  AST 25  ALT 20  ALKPHOS 64  BILITOT 0.9  PROT 6.2*  ALBUMIN 2.8*   No results for input(s): LIPASE, AMYLASE in the last 168 hours. No results for input(s): AMMONIA in the last 168 hours. Coagulation Profile: No results for input(s): INR, PROTIME in the last 168 hours. Cardiac Enzymes: No results for input(s): CKTOTAL, CKMB, CKMBINDEX, TROPONINI in the last 168 hours. BNP (last 3 results) No results for input(s): PROBNP in the last 8760 hours. HbA1C:  No results for input(s): HGBA1C in the last 72 hours. CBG: No results for input(s): GLUCAP in the last 168 hours. Lipid Profile: No results for input(s): CHOL, HDL, LDLCALC, TRIG, CHOLHDL, LDLDIRECT in the last 72 hours. Thyroid Function Tests: Recent Labs    01/03/18 0154  TSH 0.949   Anemia Panel: No results for input(s): VITAMINB12, FOLATE, FERRITIN, TIBC, IRON, RETICCTPCT in the last 72 hours. Urine analysis:    Component Value Date/Time   COLORURINE AMBER (A) 01/02/2018 1123   APPEARANCEUR HAZY (A) 01/02/2018 1123   LABSPEC 1.019 01/02/2018 1123   PHURINE 5.0 01/02/2018 1123   GLUCOSEU NEGATIVE 01/02/2018 1123   HGBUR SMALL (A) 01/02/2018 1123   BILIRUBINUR NEGATIVE 01/02/2018 1123   KETONESUR NEGATIVE 01/02/2018 1123   PROTEINUR NEGATIVE 01/02/2018 1123   UROBILINOGEN 0.2 03/29/2017 1643   NITRITE NEGATIVE 01/02/2018 1123   LEUKOCYTESUR SMALL  (A) 01/02/2018 1123   Sepsis Labs: @LABRCNTIP (procalcitonin:4,lacticacidven:4)  ) Recent Results (from the past 240 hour(s))  Blood culture (routine x 2)     Status: None (Preliminary result)   Collection Time: 01/01/18  6:35 PM  Result Value Ref Range Status   Specimen Description BLOOD RIGHT ANTECUBITAL  Final   Special Requests   Final    BOTTLES DRAWN AEROBIC AND ANAEROBIC Blood Culture adequate volume   Culture   Final    NO GROWTH < 24 HOURS Performed at [facility], [place] [address], [place], [place] [address]    Report Status PENDING  Incomplete  Blood culture (routine x 2)     Status: None (Preliminary result)   Collection Time: 01/01/18  6:35 PM  Result Value Ref Range Status   Specimen Description BLOOD LEFT ANTECUBITAL  Final   Special Requests   Final    BOTTLES DRAWN AEROBIC AND ANAEROBIC Blood Culture adequate volume   Culture   Final    NO GROWTH < 24 HOURS Performed at [facility], [place] [address] [place],  [place] [address]    Report Status PENDING  Incomplete  Aerobic/Anaerobic Culture (surgical/deep wound)     Status: None (Preliminary result)   Collection Time: 01/02/18  1:50 AM  Result Value Ref Range Status   Specimen Description ABSCESS PERIRECTAL  Final   Special Requests NONE  Final   Gram Stain   Final    RARE WBC PRESENT, PREDOMINANTLY PMN MODERATE GRAM POSITIVE COCCI IN PAIRS FEW GRAM POSITIVE RODS FEW GRAM NEGATIVE RODS    Culture   Final    CULTURE REINCUBATED FOR BETTER GROWTH Performed at [facility], [place] [address], [place], [place] [address]    Report Status PENDING  Incomplete         Radiology Studies: Ct Abdomen Pelvis Wo Contrast  Result Date: 01/01/2018 CLINICAL DATA:  Acute onset of rectal pain. Assess for abscess. EXAM: CT ABDOMEN AND PELVIS WITHOUT CONTRAST TECHNIQUE: Multidetector CT imaging of the abdomen and pelvis was performed following the standard protocol without IV contrast. COMPARISON:  MRI of  the lumbar spine performed 01/13/2013 FINDINGS: Lower chest: Minimal bibasilar atelectasis is noted. The visualized portions of the mediastinum are unremarkable Hepatobiliary: The liver is unremarkable in appearance. The gallbladder is unremarkable in appearance. The common bile duct remains normal in caliber. Pancreas: The pancreas is within normal limits. Spleen: The spleen is unremarkable in appearance. Adrenals/Urinary Tract: A 1.7 cm left adrenal adenoma is noted. A 2.0 cm thin right adrenal adenoma is also seen. Nonspecific perinephric stranding is noted bilaterally.  There is no evidence of hydronephrosis. A nonobstructing 7 mm stone is noted at the upper pole of the left kidney. The kidneys are otherwise unremarkable. No obstructing ureteral stones are seen. Stomach/Bowel: The patient is status post gastric bypass surgery. The gastrojejunal anastomosis is unremarkable in appearance. The distal stomach is largely decompressed. The small bowel is within normal limits. The appendix is normal in caliber, without evidence of appendicitis. Scattered diverticulosis is noted along the proximal sigmoid colon. There is leftward displacement of the rectum, reflecting the soft tissue infection along the right side of the pelvis. Vascular/Lymphatic: The abdominal aorta is unremarkable in appearance. The inferior vena cava is grossly unremarkable. No retroperitoneal lymphadenopathy is seen. Mildly prominent right-sided pelvic sidewall nodes likely reflect the adjacent soft tissue infection. Reproductive: The bladder is mildly distended. Mild soft tissue inflammation is noted about the right side of the bladder. The prostate remains normal in size. Other: There is a large abscess containing fluid and air at the right perineal space anterior to the anorectal canal, measuring approximately 7.2 x 3.9 x 6.0 cm. Diffuse soft tissue air tracks along the right side of the gluteal cleft and medial to the right gluteus musculature,  extending superiorly to the right of the rectum and along the right pelvic sidewall. A small amount of air tracks superiorly along the right psoas musculature to the level of the kidneys. This pattern is highly suspicious for necrotizing fasciitis. Underlying diffuse soft tissue inflammation is noted. Musculoskeletal: No acute osseous abnormalities are identified. Anterior bridging osteophytes are noted along the lower thoracic and lumbar spine. The visualized musculature is unremarkable in appearance. IMPRESSION: 1. Large abscess containing fluid and air at the right perineal space anterior to the anorectal canal, measuring approximately 7.2 x 3.9 x 6.0 cm. 2. Diffuse soft tissue air noted at the right side of the gluteal cleft and medial to the right gluteus musculature, extending superiorly to the right of the rectum and along the right pelvic sidewall. Small amount of air tracks superiorly along the right psoas musculature to the level of the kidneys. This pattern is highly suspicious for necrotizing fasciitis. Underlying diffuse soft tissue inflammation noted. 3. Mildly prominent right-sided pelvic sidewall nodes likely reflect the adjacent soft tissue infection. 4. Scattered diverticulosis along the proximal sigmoid colon, without evidence of diverticulitis. 5. Nonobstructing 7 mm stone at the upper pole of the left kidney. 6. Bilateral adrenal adenomas noted. Critical Value/emergent results were called by telephone at the time of interpretation on 01/01/2018 at 11:18 pm to Dr. Nanda Quinton, who verbally acknowledged these results. Electronically Signed   By: Garald Balding M.D.   On: 01/01/2018 23:41        Scheduled Meds: . docusate sodium  100 mg Oral BID  . enoxaparin (LOVENOX) injection  40 mg Subcutaneous Q24H  . potassium chloride  40 mEq Oral Once   Continuous Infusions: . 0.9 % NaCl with KCl 20 mEq / L 125 mL/hr at 01/02/18 1600  . ceFEPime (MAXIPIME) IV Stopped (01/02/18 2158)  .  magnesium sulfate 1 - 4 g bolus IVPB    . vancomycin Stopped (01/03/18 0630)     LOS: 1 day    Time spent: 35 minutes    Edwin Dada, MD [facility] 01/03/2018, 9:39 AM     Pager [phone] --- please page though AMION:  [email or web address] Password [email or web address] If 7PM-7AM, please contact night-coverage

## 2018-01-04 ENCOUNTER — Inpatient Hospital Stay (HOSPITAL_COMMUNITY): Payer: Medicare Other | Admitting: Certified Registered"

## 2018-01-04 ENCOUNTER — Encounter (HOSPITAL_COMMUNITY): Payer: Self-pay

## 2018-01-04 ENCOUNTER — Encounter (HOSPITAL_COMMUNITY): Admission: EM | Disposition: A | Discharge status: Skilled Nursing Facility | Payer: Self-pay | Source: Home / Self Care

## 2018-01-04 HISTORY — PX: WOUND DEBRIDEMENT: SHX247

## 2018-01-04 LAB — URINE CULTURE: CULTURE: NO GROWTH

## 2018-01-04 LAB — CBC
HCT: 35.7 % — ABNORMAL LOW (ref 39.0–52.0)
Hemoglobin: 12.4 g/dL — ABNORMAL LOW (ref 13.0–17.0)
MCH: 29.5 pg (ref 26.0–34.0)
MCHC: 34.7 g/dL (ref 30.0–36.0)
MCV: 85 fL (ref 78.0–100.0)
PLATELETS: 372 10*3/uL (ref 150–400)
RBC: 4.2 MIL/uL — ABNORMAL LOW (ref 4.22–5.81)
RDW: 14.8 % (ref 11.5–15.5)
WBC: 22.3 10*3/uL — ABNORMAL HIGH (ref 4.0–10.5)

## 2018-01-04 LAB — BASIC METABOLIC PANEL
Anion gap: 7 (ref 5–15)
BUN: 29 mg/dL — AB (ref 6–20)
CALCIUM: 8.4 mg/dL — AB (ref 8.9–10.3)
CO2: 24 mmol/L (ref 22–32)
CREATININE: 1.28 mg/dL — AB (ref 0.61–1.24)
Chloride: 107 mmol/L (ref 101–111)
GFR calc Af Amer: >60 mL/min (ref >60)
GFR, EST NON AFRICAN AMERICAN: 58 mL/min — AB (ref >60)
GLUCOSE: 99 mg/dL (ref 65–99)
Potassium: 3.6 mmol/L (ref 3.5–5.1)
Sodium: 138 mmol/L (ref 135–145)

## 2018-01-04 LAB — FUNGUS STAIN

## 2018-01-04 LAB — MAGNESIUM: MAGNESIUM: 1.6 mg/dL — AB (ref 1.7–2.4)

## 2018-01-04 LAB — VANCOMYCIN, TROUGH: VANCOMYCIN TR: 17 ug/mL (ref 15–20)

## 2018-01-04 SURGERY — DEBRIDEMENT, WOUND
Anesthesia: General | Site: Rectum

## 2018-01-04 MED ORDER — DOXYCYCLINE HYCLATE 100 MG PO TABS
100.0000 mg | ORAL_TABLET | Freq: Two times a day (BID) | ORAL | Status: AC
  Administered 2018-01-04 – 2018-01-11 (×15): 100 mg via ORAL
  Filled 2018-01-04 (×15): qty 1

## 2018-01-04 MED ORDER — MIDAZOLAM HCL 2 MG/2ML IJ SOLN
INTRAMUSCULAR | Status: AC
  Filled 2018-01-04: qty 2

## 2018-01-04 MED ORDER — LABETALOL HCL 5 MG/ML IV SOLN: 10.0000 mg | Freq: Four times a day (QID) | INTRAVENOUS | Status: DC | PRN

## 2018-01-04 MED ORDER — BUPIVACAINE-EPINEPHRINE (PF) 0.25% -1:200000 IJ SOLN
INTRAMUSCULAR | Status: AC
  Filled 2018-01-04: qty 30

## 2018-01-04 MED ORDER — FENTANYL CITRATE (PF) 250 MCG/5ML IJ SOLN
INTRAMUSCULAR | Status: DC | PRN
  Administered 2018-01-04: 75 ug via INTRAVENOUS
  Administered 2018-01-04: 50 ug via INTRAVENOUS

## 2018-01-04 MED ORDER — LIDOCAINE 2% (20 MG/ML) 5 ML SYRINGE
INTRAMUSCULAR | Status: AC
Start: 2018-01-04 — End: ?
  Filled 2018-01-04: qty 5

## 2018-01-04 MED ORDER — 0.9 % SODIUM CHLORIDE (POUR BTL) OPTIME
TOPICAL | Status: DC | PRN
  Administered 2018-01-04: 1000 mL

## 2018-01-04 MED ORDER — PHENYLEPHRINE 40 MCG/ML (10ML) SYRINGE FOR IV PUSH (FOR BLOOD PRESSURE SUPPORT)
PREFILLED_SYRINGE | INTRAVENOUS | Status: AC
  Filled 2018-01-04: qty 10

## 2018-01-04 MED ORDER — SUCCINYLCHOLINE CHLORIDE 200 MG/10ML IV SOSY
PREFILLED_SYRINGE | INTRAVENOUS | Status: AC
  Filled 2018-01-04: qty 10

## 2018-01-04 MED ORDER — PROPOFOL 10 MG/ML IV BOLUS
INTRAVENOUS | Status: DC | PRN
  Administered 2018-01-04: 20 mg via INTRAVENOUS
  Administered 2018-01-04: 130 mg via INTRAVENOUS

## 2018-01-04 MED ORDER — DOCUSATE SODIUM 100 MG PO CAPS: 100.0000 mg | ORAL_CAPSULE | Freq: Two times a day (BID) | ORAL | Status: DC

## 2018-01-04 MED ORDER — PROPOFOL 10 MG/ML IV BOLUS
INTRAVENOUS | Status: AC
  Filled 2018-01-04: qty 40

## 2018-01-04 MED ORDER — LIDOCAINE 2% (20 MG/ML) 5 ML SYRINGE
INTRAMUSCULAR | Status: DC | PRN
  Administered 2018-01-04: 60 mg via INTRAVENOUS

## 2018-01-04 MED ORDER — HYDROCODONE-ACETAMINOPHEN 5-325 MG PO TABS
1.0000 | ORAL_TABLET | ORAL | Status: DC | PRN
  Administered 2018-01-04 – 2018-01-11 (×15): 2 via ORAL
  Filled 2018-01-04 (×16): qty 2

## 2018-01-04 MED ORDER — PHENYLEPHRINE 40 MCG/ML (10ML) SYRINGE FOR IV PUSH (FOR BLOOD PRESSURE SUPPORT)
PREFILLED_SYRINGE | INTRAVENOUS | Status: DC | PRN
  Administered 2018-01-04 (×2): 120 ug via INTRAVENOUS

## 2018-01-04 MED ORDER — TRAMADOL HCL 50 MG PO TABS
50.0000 mg | ORAL_TABLET | Freq: Four times a day (QID) | ORAL | Status: DC | PRN
  Administered 2018-01-04 – 2018-01-12 (×10): 50 mg via ORAL
  Filled 2018-01-04 (×10): qty 1

## 2018-01-04 MED ORDER — PHENYLEPHRINE HCL 10 MG/ML IJ SOLN
INTRAVENOUS | Status: DC | PRN
  Administered 2018-01-04: 25 ug/min via INTRAVENOUS

## 2018-01-04 MED ORDER — MORPHINE SULFATE (PF) 2 MG/ML IV SOLN
1.0000 mg | INTRAVENOUS | Status: DC | PRN
  Administered 2018-01-06: 2 mg via INTRAVENOUS
  Administered 2018-01-06 – 2018-01-07 (×3): 4 mg via INTRAVENOUS
  Administered 2018-01-08 – 2018-01-09 (×4): 2 mg via INTRAVENOUS
  Filled 2018-01-04: qty 2
  Filled 2018-01-04 (×2): qty 1
  Filled 2018-01-04: qty 2
  Filled 2018-01-04 (×2): qty 1
  Filled 2018-01-04 (×2): qty 2
  Filled 2018-01-04 (×3): qty 1

## 2018-01-04 MED ORDER — CEFEPIME HCL 2 G IJ SOLR
2.0000 g | Freq: Two times a day (BID) | INTRAMUSCULAR | Status: DC
  Administered 2018-01-04 – 2018-01-06 (×4): 2 g via INTRAVENOUS
  Filled 2018-01-04 (×5): qty 2

## 2018-01-04 MED ORDER — SODIUM CHLORIDE 0.9 % IV SOLN
2.0000 g | INTRAVENOUS | Status: AC
  Administered 2018-01-04: 2 g via INTRAVENOUS
  Filled 2018-01-04: qty 2

## 2018-01-04 MED ORDER — ONDANSETRON HCL 4 MG/2ML IJ SOLN
INTRAMUSCULAR | Status: DC | PRN
  Administered 2018-01-04: 4 mg via INTRAVENOUS

## 2018-01-04 MED ORDER — FLEET ENEMA 7-19 GM/118ML RE ENEM
1.0000 | ENEMA | Freq: Once | RECTAL | Status: AC
  Administered 2018-01-04: 1 via RECTAL
  Filled 2018-01-04: qty 1

## 2018-01-04 MED ORDER — POLYETHYLENE GLYCOL 3350 17 G PO PACK
17.0000 g | PACK | Freq: Every day | ORAL | Status: DC
  Administered 2018-01-06: 17 g via ORAL
  Filled 2018-01-04 (×3): qty 1

## 2018-01-04 MED ORDER — DEXAMETHASONE SODIUM PHOSPHATE 10 MG/ML IJ SOLN
INTRAMUSCULAR | Status: AC
  Filled 2018-01-04: qty 1

## 2018-01-04 MED ORDER — SUCCINYLCHOLINE CHLORIDE 20 MG/ML IJ SOLN
INTRAMUSCULAR | Status: DC | PRN
  Administered 2018-01-04: 90 mg via INTRAVENOUS

## 2018-01-04 MED ORDER — CLINDAMYCIN PHOSPHATE 600 MG/50ML IV SOLN
600.0000 mg | Freq: Three times a day (TID) | INTRAVENOUS | Status: DC
  Administered 2018-01-04 – 2018-01-05 (×3): 600 mg via INTRAVENOUS
  Filled 2018-01-04 (×4): qty 50

## 2018-01-04 MED ORDER — METRONIDAZOLE IN NACL 500-0.74 MG/100ML-% IV SOLN
500.0000 mg | INTRAVENOUS | Status: DC
  Filled 2018-01-04: qty 100

## 2018-01-04 MED ORDER — TAMSULOSIN HCL 0.4 MG PO CAPS
0.4000 mg | ORAL_CAPSULE | Freq: Every day | ORAL | Status: DC
  Administered 2018-01-05 – 2018-01-12 (×8): 0.4 mg via ORAL
  Filled 2018-01-04 (×8): qty 1

## 2018-01-04 MED ORDER — LACTATED RINGERS IV SOLN
INTRAVENOUS | Status: DC
  Administered 2018-01-04 – 2018-01-05 (×3): via INTRAVENOUS

## 2018-01-04 MED ORDER — BUPIVACAINE-EPINEPHRINE 0.25% -1:200000 IJ SOLN
INTRAMUSCULAR | Status: DC | PRN
  Administered 2018-01-04: 10 mL

## 2018-01-04 MED ORDER — MAGNESIUM SULFATE 2 GM/50ML IV SOLN
2.0000 g | Freq: Once | INTRAVENOUS | Status: AC
  Administered 2018-01-04: 2 g via INTRAVENOUS
  Filled 2018-01-04: qty 50

## 2018-01-04 MED ORDER — FENTANYL CITRATE (PF) 250 MCG/5ML IJ SOLN
INTRAMUSCULAR | Status: AC
  Filled 2018-01-04: qty 5

## 2018-01-04 MED ORDER — ONDANSETRON HCL 4 MG/2ML IJ SOLN
INTRAMUSCULAR | Status: AC
  Filled 2018-01-04: qty 2

## 2018-01-04 MED ORDER — BETHANECHOL CHLORIDE 25 MG PO TABS
25.0000 mg | ORAL_TABLET | Freq: Three times a day (TID) | ORAL | Status: DC
  Administered 2018-01-04 (×2): 25 mg via ORAL
  Filled 2018-01-04 (×2): qty 1

## 2018-01-04 SURGICAL SUPPLY — 38 items
BENZOIN TINCTURE PRP APPL 2/3 (GAUZE/BANDAGES/DRESSINGS) ×2 IMPLANT
BLADE CLIPPER SURG (BLADE) IMPLANT
BLADE SURG 15 STRL LF DISP TIS (BLADE) ×1 IMPLANT
BLADE SURG 15 STRL SS (BLADE) ×1
BNDG GAUZE ELAST 4 BULKY (GAUZE/BANDAGES/DRESSINGS) ×4 IMPLANT
BRIEF STRETCH FOR OB PAD LRG (UNDERPADS AND DIAPERS) ×2 IMPLANT
CANISTER SUCT 3000ML PPV (MISCELLANEOUS) ×2 IMPLANT
COVER SURGICAL LIGHT HANDLE (MISCELLANEOUS) ×2 IMPLANT
DRAPE LAPAROTOMY T 102X78X121 (DRAPES) ×2 IMPLANT
DRAPE UTILITY XL STRL (DRAPES) ×4 IMPLANT
DRSG PAD ABDOMINAL 8X10 ST (GAUZE/BANDAGES/DRESSINGS) ×4 IMPLANT
ELECT CAUTERY BLADE 6.4 (BLADE) ×2 IMPLANT
ELECT REM PT RETURN 9FT ADLT (ELECTROSURGICAL) ×2
ELECTRODE REM PT RTRN 9FT ADLT (ELECTROSURGICAL) ×1 IMPLANT
GAUZE SPONGE 4X4 12PLY STRL (GAUZE/BANDAGES/DRESSINGS) ×4 IMPLANT
GLOVE BIO SURGEON STRL SZ7 (GLOVE) ×2 IMPLANT
GLOVE BIOGEL PI IND STRL 7.5 (GLOVE) ×1 IMPLANT
GLOVE BIOGEL PI INDICATOR 7.5 (GLOVE) ×1
GOWN STRL REUS W/ TWL LRG LVL3 (GOWN DISPOSABLE) ×2 IMPLANT
GOWN STRL REUS W/TWL LRG LVL3 (GOWN DISPOSABLE) ×2
KIT BASIN OR (CUSTOM PROCEDURE TRAY) ×2 IMPLANT
KIT TURNOVER KIT B (KITS) ×2 IMPLANT
NEEDLE 22X1 1/2 (OR ONLY) (NEEDLE) ×2 IMPLANT
NS IRRIG 1000ML POUR BTL (IV SOLUTION) ×2 IMPLANT
PACK SURGICAL SETUP 50X90 (CUSTOM PROCEDURE TRAY) ×2 IMPLANT
PAD ARMBOARD 7.5X6 YLW CONV (MISCELLANEOUS) ×4 IMPLANT
PENCIL BUTTON HOLSTER BLD 10FT (ELECTRODE) ×2 IMPLANT
SPECIMEN JAR SMALL (MISCELLANEOUS) ×2 IMPLANT
SPONGE LAP 18X18 X RAY DECT (DISPOSABLE) ×2 IMPLANT
SUT ETHILON 2 0 FS 18 (SUTURE) IMPLANT
SUT SILK 2 0 SH CR/8 (SUTURE) ×2 IMPLANT
SUT VIC AB 3-0 SH 18 (SUTURE) IMPLANT
SYR BULB IRRIGATION 50ML (SYRINGE) ×2 IMPLANT
SYR CONTROL 10ML LL (SYRINGE) ×2 IMPLANT
TOWEL OR 17X24 6PK STRL BLUE (TOWEL DISPOSABLE) ×2 IMPLANT
TOWEL OR 17X26 10 PK STRL BLUE (TOWEL DISPOSABLE) ×2 IMPLANT
TUBE CONNECTING 12X1/4 (SUCTIONS) ×2 IMPLANT
YANKAUER SUCT BULB TIP NO VENT (SUCTIONS) ×2 IMPLANT

## 2018-01-04 NOTE — Progress Notes (Signed)
2 Days Post-Op   Subjective/Chief Complaint: C/O constipation   Objective: Vital signs in last 24 hours: Temp:  [97.3 F (36.3 C)-99 F (37.2 C)] 98.4 F (36.9 C) (06/10 0300) Pulse Rate:  [69-80] 70 (06/10 0300) BP: (130-158)/(60-74) 146/63 (06/10 0300) SpO2:  [93 %-97 %] 97 % (06/10 0300) Last BM Date: 01/01/18  Intake/Output from previous day: 06/09 0701 - 06/10 0700 In: 4115 [P.O.:240; I.V.:2875; IV Piggyback:1000] Out: 350 [Urine:350] Intake/Output this shift: No intake/output data recorded.  General appearance: alert and cooperative Resp: clear to auscultation bilaterally Cardio: regular rate and rhythm Incision/Wound:dressing in place  Lab Results:  Recent Labs    01/03/18 0154 01/04/18 0310  WBC 18.8* 22.3*  HGB 11.7* 12.4*  HCT 33.9* 35.7*  PLT 342 372   BMET Recent Labs    01/03/18 0154 01/04/18 0310  NA 139 138  K 3.1* 3.6  CL 107 107  CO2 23 24  GLUCOSE 90 99  BUN 41* 29*  CREATININE 1.56* 1.28*  CALCIUM 7.9* 8.4*   PT/INR No results for input(s): LABPROT, INR in the last 72 hours. ABG No results for input(s): PHART, HCO3 in the last 72 hours.  Invalid input(s): PCO2, PO2  Studies/Results: No results found.  Anti-infectives: Anti-infectives (From admission, onward)   Start     Dose/Rate Route Frequency Ordered Stop   01/03/18 1000  metroNIDAZOLE (FLAGYL) IVPB 500 mg     500 mg 100 mL/hr over 60 Minutes Intravenous Every 8 hours 01/03/18 0956     01/02/18 1000  ceFEPIme (MAXIPIME) 2 g in sodium chloride 0.9 % 100 mL IVPB    Note to Pharmacy:  Pharm to dose   2 g 200 mL/hr over 30 Minutes Intravenous Every 12 hours 01/02/18 0340     01/02/18 0600  vancomycin (VANCOCIN) IVPB 1000 mg/200 mL premix     1,000 mg 200 mL/hr over 60 Minutes Intravenous Every 12 hours 01/02/18 0409     01/01/18 2330  ceFEPIme (MAXIPIME) 2 g in sodium chloride 0.9 % 100 mL IVPB     2 g 200 mL/hr over 30 Minutes Intravenous  Once 01/01/18 2321 01/02/18  0038   01/01/18 2315  vancomycin (VANCOCIN) 2,000 mg in sodium chloride 0.9 % 500 mL IVPB     2,000 mg 250 mL/hr over 120 Minutes Intravenous  Once 01/01/18 2313 01/02/18 0328   01/01/18 1945  cefTRIAXone (ROCEPHIN) 1 g in sodium chloride 0.9 % 100 mL IVPB     1 g 200 mL/hr over 30 Minutes Intravenous  Once 01/01/18 1932 01/01/18 2202   01/01/18 1945  metroNIDAZOLE (FLAGYL) IVPB 500 mg     500 mg 100 mL/hr over 60 Minutes Intravenous  Once 01/01/18 1932 01/01/18 2317      Assessment/Plan: Necrotizing perirectal soft tissue infection - back to OR for exam under anesthesia and possible further debridement as needed. I discussed the procedure, risks, and benefits with him. I also spoke with Dr. Loleta Books at the bedside - appreciate their help.  AKI - per Dr. Loleta Books, CRT down to 1.28  LOS: 2 days    Zenovia Jarred 01/04/2018

## 2018-01-04 NOTE — Op Note (Signed)
01/04/2018  12:22 PM  PATIENT:  Brandon Marks  62 y.o. male  PRE-OPERATIVE DIAGNOSIS:  necrotic soft tissue infection buttock  POST-OPERATIVE DIAGNOSIS:  Tick on R scrotum, necrotic soft tissue infection buttock  PROCEDURE:  Procedure(s): REMOVAL OF TICK FROM R SCROTUM IRRIGATION AND DEBRIDEMENT OF BUTTOCKS  SURGEON:  Surgeon(s): Georganna Skeans, MD  ASSISTANTS: Saverio Danker, PAC   ANESTHESIA:   local and general  EBL:  25cc  BLOOD ADMINISTERED: None  DRAINS: none   SPECIMEN:  Excision  DISPOSITION OF SPECIMEN:  PATHOLOGY  COUNTS:  YES  DICTATION: .Dragon Dictation 1.  Progress note or procedure note with a detailed description of the procedure. Brandon Marks presents for examination under anesthesia and debridement of buttock necrotizing soft tissue infection.  He initially underwent debridement by Dr. Ninfa Linden on 01/02/2018.  He continues on IV antibiotics.  Informed consent was obtained.  He was brought to the operating room and general anesthesia was administered by the anesthesia staff.  He was placed in lithotomy position.  Packing was removed from the wound in preparation for prep.  We noticed a tick on his right upper scrotum.  We did a timeout procedure.  Take was removed with forceps in its entirety and it was sent to pathology.  His perineum and buttock area was then prepped and draped in sterile fashion.  The wound was measured with measurements as below noted.  There was a lot of necrotic tissue present.  I then began sharp debridement using scissors and also using cautery to debride all devitalized tissue.  There was evidence of necrotic tissue and purulent discharge with ongoing infection.  All of this was debrided back.  Hemostasis was obtained with cautery and several interrupted silk sutures.  Once all devitalized and necrotic tissue was removed.  We copiously irrigated with saline.  Hemostasis was then ensured and the wound was packed with saline soaked Kerlix and  covered by a sterile dressing.  He tolerated procedure well without apparent complication and was taken recovery in stable condition.  Please see further debridement description below. 2.  Tool used for debridement (curette, scapel, etc.)  Scissors and cautery  3.  Frequency of surgical debridement.   tis was the second time  4.  Measurement of total devitalized tissue (wound surface) before and after surgical debridement.   Before 8 cm x 6 cm x 4.5 cm deep.  After 11 cm x 6 cm x 7 cm deep  5.  Area and depth of devitalized tissue removed from wound.  See above  6.  Blood loss and description of tissue removed.  Mostly necrotic tissue with purulent discharge, EBL 25 cc  7.  Evidence of the progress of the wound's response to treatment.  A.  Current wound volume (current dimensions and depth).  11 cm x 6 cm x 7 cm deep  B.  Presence (and extent of) of infection.  Present  C.  Presence (and extent of) of non viable tissue.  Nonviable tissue debrided  D.  Other material in the wound that is expected to inhibit healing.  No  8.  Was there any viable tissue removed (measurements): Minimal  PATIENT DISPOSITION:  PACU - hemodynamically stable.   Delay start of Pharmacological VTE agent (>24hrs) due to surgical blood loss or risk of bleeding:  no  Georganna Skeans, MD, MPH, FACS Pager: [phone]  6/10/201912:22 PM

## 2018-01-04 NOTE — Consult Note (Signed)
[facility] Nurse wound consult note Reason for Consult:Right buttocks abscess with surgical debridement 01/02/09.  Back to OR today.  Will be available for WOC assistance as needed post operatively. No needs at this time.    Wound type:infectious Pressure Injury POA: NA Drainage (amount, consistency, odor) moderate serosanguinous  Periwound:erytehma and tenderness Dressing procedure/placement/frequency:Per surgical orders.  Back to OR today [facility] team will remain available as needed postoperatively.  Domenic Moras RN BSN [place] Pager [phone]

## 2018-01-04 NOTE — Anesthesia Procedure Notes (Signed)
Procedure Name: Intubation Date/Time: 01/04/2018 11:24 AM Performed by: Imagene Riches, CRNA Pre-anesthesia Checklist: Patient identified, Emergency Drugs available, Suction available and Patient being monitored Patient Re-evaluated:Patient Re-evaluated prior to induction Oxygen Delivery Method: Circle System Utilized Preoxygenation: Pre-oxygenation with 100% oxygen Induction Type: IV induction Ventilation: Mask ventilation with difficulty and Two handed mask ventilation required Laryngoscope Size: Glidescope and 4 Grade View: Grade I Tube type: Oral Tube size: 7.5 mm Number of attempts: 1 Airway Equipment and Method: Stylet and Oral airway Placement Confirmation: ETT inserted through vocal cords under direct vision,  positive ETCO2 and breath sounds checked- equal and bilateral Secured at: 24 cm Tube secured with: Tape Dental Injury: Teeth and Oropharynx as per pre-operative assessment  Difficulty Due To: Difficult Airway- due to reduced neck mobility

## 2018-01-04 NOTE — Anesthesia Preprocedure Evaluation (Signed)
Anesthesia Evaluation  Patient identified by MRN, date of birth, ID band Patient awake    Reviewed: Allergy & Precautions, H&P , NPO status , Patient's Chart, lab work & pertinent test results  History of Anesthesia Complications Negative for: history of anesthetic complications  Airway Mallampati: II  TM Distance: >3 FB Neck ROM: Full    Dental  (+) Teeth Intact, Dental Advisory Given   Pulmonary shortness of breath, sleep apnea ,    breath sounds clear to auscultation       Cardiovascular hypertension, Pt. on medications  Rhythm:Regular Rate:Normal     Neuro/Psych negative neurological ROS  negative psych ROS   GI/Hepatic negative GI ROS, Neg liver ROS,   Endo/Other  Morbid obesity  Renal/GU negative Renal ROS     Musculoskeletal  (+) Arthritis , Osteoarthritis,    Abdominal   Peds  Hematology negative hematology ROS (+)   Anesthesia Other Findings S/p recent gastric bypass surgery at [facility]  Reproductive/Obstetrics                             Anesthesia Physical Anesthesia Plan  ASA: III  Anesthesia Plan: General   Post-op Pain Management:    Induction: Intravenous  PONV Risk Score and Plan: 2 and Ondansetron and Dexamethasone  Airway Management Planned: LMA and Oral ETT  Additional Equipment: None  Intra-op Plan:   Post-operative Plan: Extubation in OR  Informed Consent: I have reviewed the patients History and Physical, chart, labs and discussed the procedure including the risks, benefits and alternatives for the proposed anesthesia with the patient or authorized representative who has indicated his/her understanding and acceptance.   Dental advisory given  Plan Discussed with: CRNA and Surgeon  Anesthesia Plan Comments:         Anesthesia Quick Evaluation

## 2018-01-04 NOTE — Anesthesia Postprocedure Evaluation (Signed)
Anesthesia Post Note  Patient: Brandon Marks  Procedure(s) Performed: IRRIGATION AND DEBRIDEMENT OF BUTTOCKS AND REMOVAL OF TICK FROM RIGHT TESTICLE (N/A Rectum)     Patient location during evaluation: PACU Anesthesia Type: General Level of consciousness: awake and alert Pain management: pain level controlled Vital Signs Assessment: post-procedure vital signs reviewed and stable Respiratory status: spontaneous breathing, nonlabored ventilation, respiratory function stable and patient connected to nasal cannula oxygen Cardiovascular status: blood pressure returned to baseline and stable Postop Assessment: no apparent nausea or vomiting Anesthetic complications: no    Last Vitals:  Vitals:   01/04/18 1236 01/04/18 1250  BP: 125/60 130/63  Pulse: 71 72  Resp: (!) 0 20  Temp:    SpO2: 97% 97%    Last Pain:  Vitals:   01/04/18 1250  TempSrc:   PainSc: 0-No pain                 Cariah Salatino

## 2018-01-04 NOTE — Transfer of Care (Signed)
Immediate Anesthesia Transfer of Care Note  Patient: Brandon Marks  Procedure(s) Performed: IRRIGATION AND DEBRIDEMENT OF BUTTOCKS AND REMOVAL OF TICK FROM RIGHT TESTICLE (N/A Rectum)  Patient Location: PACU  Anesthesia Type:General  Level of Consciousness: drowsy  Airway & Oxygen Therapy: Patient Spontanous Breathing and Patient connected to nasal cannula oxygen  Post-op Assessment: Report given to RN and Post -op Vital signs reviewed and stable  Post vital signs: Reviewed and stable  Last Vitals:  Vitals Value Taken Time  BP 125/60 01/04/2018 12:36 PM  Temp    Pulse 71 01/04/2018 12:36 PM  Resp 0 01/04/2018 12:36 PM  SpO2 97 % 01/04/2018 12:36 PM    Last Pain:  Vitals:   01/04/18 0600  TempSrc:   PainSc: Asleep      Patients Stated Pain Goal: 2 (41/28/78 6767)  Complications: No apparent anesthesia complications

## 2018-01-04 NOTE — Progress Notes (Signed)
Pharmacy Antibiotic Note  Brandon Marks is a 62 y.o. male admitted on 01/01/2018 with perirectal abcess/necrotizing fasciitis s/p debridement .    Vancomycin trough = 17 (therapeutic)  Plan: Continue Vancomycin 1 g IV q12h F/U renal function   Weight: (!) 324 lb 1.2 oz (147 kg)  Temp (24hrs), Avg:98.2 F (36.8 C), Min:97.3 F (36.3 C), Max:99 F (37.2 C)  Recent Labs  Lab 01/01/18 1835 01/01/18 1836 01/01/18 2130 01/02/18 0631 01/02/18 1212 01/03/18 0154 01/04/18 0310 01/04/18 1733  WBC 18.5*  --   --  24.7*  --  18.8* 22.3*  --   CREATININE 2.14*  --   --  1.90*  --  1.56* 1.28*  --   LATICACIDVEN  --  2.61* 3.10*  --  1.9  --   --   --   VANCOTROUGH  --   --   --   --   --   --   --  17    Estimated Creatinine Clearance: 90.3 mL/min (A) (by C-G formula based on SCr of 1.28 mg/dL (H)).    Allergies  Allergen Reactions  . Codeine Nausea And Vomiting  . Oxycodone Other (See Comments)    Upset GI    Thank you Anette Guarneri, PharmD [phone] 01/04/2018 6:33 PM

## 2018-01-04 NOTE — Progress Notes (Signed)
PROGRESS NOTE    Brandon Marks  JSE:[id number] DOB: 1956/02/19 DOA: 01/01/2018 PCP: Tamsen Roers, MD      Brief Narrative:  Brandon Marks is a 62 y.o. M with hx obesity s/p gastric bypass, HTN and OSA on CPAP who presents with perianal pain, found to have sepsis from peri-rectal abscess, also crepitus, on surgical debridement, found to necrotizing fasciitis.   Assessment & Plan:  Acute prerenal kidney injury Improved again.   Baseline Cr 1.0. Essentially resolved. -Trend BMP -Stop IVF, slightly edematous now, taking good PO -Avoid NSAIDs -Reasonable to hold his BP meds a few days further  Hypertension BP starting to go up again -Hold lisinopril and HCTZ and monitor Cr -Labetaolol PRN for hypertension     Sinus arrhythmia This is a long-standing issue for the patient.  Is clinically insignificant.    Abscence of QT interval prolongation Initial concern for long QT.  QT interval is normal.   Hypokalemia Hypomagnesemia Resolved. -Supplement Mag again  Doubt recurrent UTI See long discussion from my prog note yesterday.  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  -Continue vanc, cefepime, flagyl -Follow intra-op cultures -Follow urine culture         DVT prophylaxis: Lovenox Code Status: FULL Family Communication: None present MDM and disposition Plan: The below labs and imaging reports were reviewed and summarized above.    The patient was admitted with perirectal abscess and nec fasc, a lifethreatening infection, with sepsis on admission.  He has been debrided once, general surgery are following closely, considering further debridement.  In the meantime he is on vancomycin, cefepime and will add Flagyl.  His renal  function is improving today, we will monitor this closely, this appears to be a prerenal process, may be of some pyelonephritis involved, either way will be resolving we suspect.   Procedures:   Incision, drainage, sharp debridement perirectal abscess and necrotizing fasciitis 6/8  Antimicrobials:   Vancomycin 6/7 >>  Cefepime 6/7 >>  Flagyl 6/7 x1 then 6/9 >>  Cuilture:   Blood Cx 6/7: NG  Urine Cx 6/8: NG  Operative abscess culture 6/8: Gram stain positive     Subjective: Pain stable.  Making good urine.  No new fever.  No vomiting, abdominal pain, no confusion.  Objective: Vitals:   01/03/18 1851 01/03/18 1900 01/03/18 2300 01/04/18 0300  BP:  (!) 158/69 (!) 145/60 (!) 146/63  Pulse:  80 77 70  Resp:      Temp: (!) 97.3 F (36.3 C) 98.5 F (36.9 C) 99 F (37.2 C) 98.4 F (36.9 C)  TempSrc:  Oral Oral Oral  SpO2:  95% 97% 97%  Weight:        Intake/Output Summary (Last 24 hours) at 01/04/2018 0856 Last data filed at 01/04/2018 0600 Gross per 24 hour  Intake 4115 ml  Output 350 ml  Net 3765 ml   Filed Weights   01/02/18 0351 01/02/18 0634  Weight: (!) 147 kg (324 lb 1.2 oz) (!) 147 kg (324 lb 1.2 oz)    Examination: General appearance: Obese adult male, lying in bed, no acute distress, interactive. HEENT: Anicteric, OP moist, no oral lesions, lips and teeth normal, hearing normal.  Skin: Warm and dry, no suspicious lesions, wound not examined. Cardiac: RRR, no murmurs, nonpitting edema noted. Respiratory: Lungs  clear, resp effort normal lying flat. Abdomen: Abdomen soft, no TTP.  No HSM. MSK: No deformities or effusions of large joints of upper or lower extremities bilatearlly. Neuro: Awake and alert, normal CNs, speech fluent. Psych: Attention, judgment and insight normal.    Data Reviewed: I have personally reviewed following labs and imaging studies:  CBC: Recent Labs  Lab 01/01/18 1835 01/02/18 0631 01/03/18 0154 01/04/18 0310  WBC  18.5* 24.7* 18.8* 22.3*  NEUTROABS 17.0*  --   --   --   HGB 14.0 15.0 11.7* 12.4*  HCT 40.6 42.9 33.9* 35.7*  MCV 85.7 85.0 85.6 85.0  PLT 356 247 342 283   Basic Metabolic Panel: Recent Labs  Lab 01/01/18 1835 01/02/18 0631 01/03/18 0154 01/04/18 0310  NA 137 137 139 138  K 2.9* 3.4* 3.1* 3.6  CL 98* 105 107 107  CO2 27 20* 23 24  GLUCOSE 97 100* 90 99  BUN 43* 47* 41* 29*  CREATININE 2.14* 1.90* 1.56* 1.28*  CALCIUM 8.5* 7.8* 7.9* 8.4*  MG  --   --  1.5* 1.6*   GFR: Estimated Creatinine Clearance: 90.3 mL/min (A) (by C-G formula based on SCr of 1.28 mg/dL (H)). Liver Function Tests: Recent Labs  Lab 01/01/18 1835  AST 25  ALT 20  ALKPHOS 64  BILITOT 0.9  PROT 6.2*  ALBUMIN 2.8*   No results for input(s): LIPASE, AMYLASE in the last 168 hours. No results for input(s): AMMONIA in the last 168 hours. Coagulation Profile: No results for input(s): INR, PROTIME in the last 168 hours. Cardiac Enzymes: No results for input(s): CKTOTAL, CKMB, CKMBINDEX, TROPONINI in the last 168 hours. BNP (last 3 results) No results for input(s): PROBNP in the last 8760 hours. HbA1C: No results for input(s): HGBA1C in the last 72 hours. CBG: No results for input(s): GLUCAP in the last 168 hours. Lipid Profile: No results for input(s): CHOL, HDL, LDLCALC, TRIG, CHOLHDL, LDLDIRECT in the last 72 hours. Thyroid Function Tests: Recent Labs    01/03/18 0154  TSH 0.949   Anemia Panel: No results for input(s): VITAMINB12, FOLATE, FERRITIN, TIBC, IRON, RETICCTPCT in the last 72 hours. Urine analysis:    Component Value Date/Time   COLORURINE AMBER (A) 01/02/2018 1123   APPEARANCEUR HAZY (A) 01/02/2018 1123   LABSPEC 1.019 01/02/2018 1123   PHURINE 5.0 01/02/2018 1123   GLUCOSEU NEGATIVE 01/02/2018 1123   HGBUR SMALL (A) 01/02/2018 1123   BILIRUBINUR NEGATIVE 01/02/2018 1123   KETONESUR NEGATIVE 01/02/2018 1123   PROTEINUR NEGATIVE 01/02/2018 1123   UROBILINOGEN 0.2 03/29/2017  1643   NITRITE NEGATIVE 01/02/2018 1123   LEUKOCYTESUR SMALL (A) 01/02/2018 1123   Sepsis Labs: @LABRCNTIP (procalcitonin:4,lacticacidven:4)  ) Recent Results (from the past 240 hour(s))  Blood culture (routine x 2)     Status: None (Preliminary result)   Collection Time: 01/01/18  6:35 PM  Result Value Ref Range Status   Specimen Description BLOOD RIGHT ANTECUBITAL  Final   Special Requests   Final    BOTTLES DRAWN AEROBIC AND ANAEROBIC Blood Culture adequate volume   Culture   Final    NO GROWTH 2 DAYS Performed at [facility], [place] [address] [place], [place] [address]    Report Status PENDING  Incomplete  Blood culture (routine x 2)     Status: None (Preliminary result)   Collection Time: 01/01/18  6:35 PM  Result Value Ref Range Status   Specimen Description BLOOD LEFT ANTECUBITAL  Final   Special Requests  Final    BOTTLES DRAWN AEROBIC AND ANAEROBIC Blood Culture adequate volume   Culture   Final    NO GROWTH 2 DAYS Performed at [facility], [place] [address] [place], [place] [address]    Report Status PENDING  Incomplete  Aerobic/Anaerobic Culture (surgical/deep wound)     Status: None (Preliminary result)   Collection Time: 01/02/18  1:50 AM  Result Value Ref Range Status   Specimen Description ABSCESS PERIRECTAL  Final   Special Requests NONE  Final   Gram Stain   Final    RARE WBC PRESENT, PREDOMINANTLY PMN MODERATE GRAM POSITIVE COCCI IN PAIRS FEW GRAM POSITIVE RODS FEW GRAM NEGATIVE RODS    Culture   Final    CULTURE REINCUBATED FOR BETTER GROWTH Performed at [facility], [place] [address], [place], [place] [address]    Report Status PENDING  Incomplete         Radiology Studies: No results found.      Scheduled Meds: . docusate sodium  100 mg Oral BID  . enoxaparin (LOVENOX) injection  40 mg Subcutaneous Q24H   Continuous Infusions: . ceFEPime (MAXIPIME) IV Stopped (01/03/18 2259)  . magnesium sulfate 1 - 4 g bolus IVPB 2  g (01/04/18 0835)  . metronidazole Stopped (01/04/18 0252)  . vancomycin 1,000 mg (01/04/18 0535)     LOS: 2 days    Time spent: 25 minutes     Edwin Dada, MD [facility] 01/04/2018, 8:56 AM     Pager [phone] --- please page though AMION:  [email or web address] Password [email or web address] If 7PM-7AM, please contact night-coverage

## 2018-01-05 ENCOUNTER — Other Ambulatory Visit: Payer: Self-pay

## 2018-01-05 ENCOUNTER — Encounter (HOSPITAL_COMMUNITY): Payer: Self-pay | Admitting: General Surgery

## 2018-01-05 ENCOUNTER — Inpatient Hospital Stay (HOSPITAL_COMMUNITY): Payer: Medicare Other | Admitting: Certified Registered Nurse Anesthetist

## 2018-01-05 ENCOUNTER — Encounter (HOSPITAL_COMMUNITY): Admission: EM | Disposition: A | Discharge status: Skilled Nursing Facility | Payer: Self-pay | Source: Home / Self Care

## 2018-01-05 DIAGNOSIS — N179 Acute kidney failure, unspecified: Secondary | ICD-10-CM

## 2018-01-05 DIAGNOSIS — K651 Peritoneal abscess: Secondary | ICD-10-CM

## 2018-01-05 DIAGNOSIS — M726 Necrotizing fasciitis: Secondary | ICD-10-CM

## 2018-01-05 HISTORY — PX: IRRIGATION AND DEBRIDEMENT BUTTOCKS: SHX6601

## 2018-01-05 SURGERY — EXAM UNDER ANESTHESIA
Anesthesia: General | Site: Anus

## 2018-01-05 MED ORDER — ONDANSETRON HCL 4 MG/2ML IJ SOLN
INTRAMUSCULAR | Status: DC | PRN
  Administered 2018-01-05: 4 mg via INTRAVENOUS

## 2018-01-05 MED ORDER — PROPOFOL 10 MG/ML IV BOLUS
INTRAVENOUS | Status: DC | PRN
  Administered 2018-01-05: 200 mg via INTRAVENOUS

## 2018-01-05 MED ORDER — PHENYLEPHRINE HCL 10 MG/ML IJ SOLN
INTRAVENOUS | Status: DC | PRN
  Administered 2018-01-05: 25 ug/min via INTRAVENOUS

## 2018-01-05 MED ORDER — MIDAZOLAM HCL 2 MG/2ML IJ SOLN
INTRAMUSCULAR | Status: DC | PRN
  Administered 2018-01-05: 2 mg via INTRAVENOUS

## 2018-01-05 MED ORDER — MIDAZOLAM HCL 2 MG/2ML IJ SOLN
INTRAMUSCULAR | Status: AC
  Filled 2018-01-05: qty 2

## 2018-01-05 MED ORDER — MEPERIDINE HCL 50 MG/ML IJ SOLN: 6.2500 mg | INTRAMUSCULAR | Status: DC | PRN

## 2018-01-05 MED ORDER — FENTANYL CITRATE (PF) 250 MCG/5ML IJ SOLN
INTRAMUSCULAR | Status: DC | PRN
  Administered 2018-01-05: 100 ug via INTRAVENOUS
  Administered 2018-01-05: 50 ug via INTRAVENOUS

## 2018-01-05 MED ORDER — LIDOCAINE 2% (20 MG/ML) 5 ML SYRINGE
INTRAMUSCULAR | Status: DC | PRN
  Administered 2018-01-05: 100 mg via INTRAVENOUS

## 2018-01-05 MED ORDER — 0.9 % SODIUM CHLORIDE (POUR BTL) OPTIME
TOPICAL | Status: DC | PRN
  Administered 2018-01-05: 1000 mL

## 2018-01-05 MED ORDER — BUPIVACAINE-EPINEPHRINE 0.25% -1:200000 IJ SOLN
INTRAMUSCULAR | Status: DC | PRN
  Administered 2018-01-05: 10 mL

## 2018-01-05 MED ORDER — PROMETHAZINE HCL 25 MG/ML IJ SOLN: 6.2500 mg | INTRAMUSCULAR | Status: DC | PRN

## 2018-01-05 MED ORDER — BETHANECHOL CHLORIDE 25 MG PO TABS
50.0000 mg | ORAL_TABLET | Freq: Three times a day (TID) | ORAL | Status: DC
  Administered 2018-01-05 – 2018-01-12 (×20): 50 mg via ORAL
  Filled 2018-01-05 (×21): qty 2

## 2018-01-05 MED ORDER — FENTANYL CITRATE (PF) 250 MCG/5ML IJ SOLN
INTRAMUSCULAR | Status: AC
  Filled 2018-01-05: qty 5

## 2018-01-05 MED ORDER — PROPOFOL 10 MG/ML IV BOLUS
INTRAVENOUS | Status: AC
  Filled 2018-01-05: qty 40

## 2018-01-05 MED ORDER — BUPIVACAINE-EPINEPHRINE (PF) 0.25% -1:200000 IJ SOLN
INTRAMUSCULAR | Status: AC
  Filled 2018-01-05: qty 30

## 2018-01-05 MED ORDER — LACTATED RINGERS IV SOLN
INTRAVENOUS | Status: DC
  Administered 2018-01-05 – 2018-01-10 (×2): via INTRAVENOUS

## 2018-01-05 MED ORDER — KETOROLAC TROMETHAMINE 30 MG/ML IJ SOLN: 30.0000 mg | Freq: Once | INTRAMUSCULAR | Status: DC | PRN

## 2018-01-05 MED ORDER — HYDROMORPHONE HCL 2 MG/ML IJ SOLN: 0.2500 mg | INTRAMUSCULAR | Status: DC | PRN

## 2018-01-05 MED ORDER — DEXAMETHASONE SODIUM PHOSPHATE 10 MG/ML IJ SOLN
INTRAMUSCULAR | Status: DC | PRN
  Administered 2018-01-05: 5 mg via INTRAVENOUS

## 2018-01-05 SURGICAL SUPPLY — 35 items
BNDG GAUZE ELAST 4 BULKY (GAUZE/BANDAGES/DRESSINGS) ×2 IMPLANT
CANISTER SUCT 3000ML PPV (MISCELLANEOUS) ×2 IMPLANT
COVER SURGICAL LIGHT HANDLE (MISCELLANEOUS) ×2 IMPLANT
DRAPE LAPAROSCOPIC ABDOMINAL (DRAPES) ×2 IMPLANT
DRAPE UTILITY XL STRL (DRAPES) ×4 IMPLANT
DRSG PAD ABDOMINAL 8X10 ST (GAUZE/BANDAGES/DRESSINGS) ×2 IMPLANT
ELECT REM PT RETURN 9FT ADLT (ELECTROSURGICAL)
ELECTRODE REM PT RTRN 9FT ADLT (ELECTROSURGICAL) IMPLANT
GAUZE SPONGE 4X4 12PLY STRL (GAUZE/BANDAGES/DRESSINGS) ×2 IMPLANT
GAUZE SPONGE 4X4 12PLY STRL LF (GAUZE/BANDAGES/DRESSINGS) ×2 IMPLANT
GAUZE SPONGE 4X4 16PLY XRAY LF (GAUZE/BANDAGES/DRESSINGS) ×2 IMPLANT
GLOVE BIO SURGEON STRL SZ8 (GLOVE) ×2 IMPLANT
GLOVE BIOGEL PI IND STRL 8 (GLOVE) ×1 IMPLANT
GLOVE BIOGEL PI INDICATOR 8 (GLOVE) ×1
GOWN STRL REUS W/ TWL LRG LVL3 (GOWN DISPOSABLE) ×2 IMPLANT
GOWN STRL REUS W/ TWL XL LVL3 (GOWN DISPOSABLE) ×1 IMPLANT
GOWN STRL REUS W/TWL LRG LVL3 (GOWN DISPOSABLE) ×2
GOWN STRL REUS W/TWL XL LVL3 (GOWN DISPOSABLE) ×1
KIT BASIN OR (CUSTOM PROCEDURE TRAY) ×2 IMPLANT
KIT TURNOVER KIT B (KITS) ×2 IMPLANT
MARKER SKIN DUAL TIP RULER LAB (MISCELLANEOUS) ×2 IMPLANT
NEEDLE HYPO 25GX1X1/2 BEV (NEEDLE) ×2 IMPLANT
NS IRRIG 1000ML POUR BTL (IV SOLUTION) ×2 IMPLANT
PACK LITHOTOMY IV (CUSTOM PROCEDURE TRAY) ×2 IMPLANT
PAD ABD 8X10 STRL (GAUZE/BANDAGES/DRESSINGS) ×2 IMPLANT
PAD ARMBOARD 7.5X6 YLW CONV (MISCELLANEOUS) ×4 IMPLANT
PENCIL BUTTON HOLSTER BLD 10FT (ELECTRODE) IMPLANT
SUT SILK 2 0 SH CR/8 (SUTURE) ×2 IMPLANT
SWAB COLLECTION DEVICE MRSA (MISCELLANEOUS) ×2 IMPLANT
SYR BULB IRRIGATION 50ML (SYRINGE) ×2 IMPLANT
TOWEL OR 17X24 6PK STRL BLUE (TOWEL DISPOSABLE) ×2 IMPLANT
TOWEL OR 17X26 10 PK STRL BLUE (TOWEL DISPOSABLE) ×2 IMPLANT
TUBE CONNECTING 12X1/4 (SUCTIONS) ×2 IMPLANT
UNDERPAD 30X30 (UNDERPADS AND DIAPERS) ×2 IMPLANT
YANKAUER SUCT BULB TIP NO VENT (SUCTIONS) ×2 IMPLANT

## 2018-01-05 NOTE — Progress Notes (Signed)
PROGRESS NOTE  Brandon Marks EPP:[id number] DOB: Apr 17, 1956 DOA: 01/01/2018 PCP: Tamsen Roers, MD   LOS: 3 days   Brief Narrative / Interim history: 62 year old male with history of morbid obesity status post gastric bypass, hypertension, sleep apnea on CPAP who presented with perianal pain, found to have perirectal abscess with deep necrotizing fasciitis.  He was admitted to general surgery service and we were asked to consult for acute kidney injury and hypertension.  Assessment & Plan: Active Problems:   Necrotizing fasciitis ([place])   Acute kidney injury -Likely prerenal in the setting of sepsis, he received IV fluids antibiotics and kidney function slowly improving.  Creatinine on admission was 2.14, currently 1.2 -Continue to closely monitor given potentially nephrotoxic antibiotics on his regimen  Sepsis due to necrotizing fasciitis -Management per primary team, patient underwent I&D on 6/8, 6/10, 6/11 -Continue antibiotics with vancomycin, cefepime, metronidazole.  Already on Doxy suspect due to presence of tick on his scrotum.  Defer antibiotics to surgical team -Wound microbiology with moderate E. coli which is pansensitive, also with moderate GPC's and gram-positive rods, final report is pending.  Blood cultures negative  Hypertension -Blood pressure between 606 and 301 systolic, continue to hold lisinopril HCTZ due to acute kidney injury -Monitor, if needed may need to add Norvasc  Sinus arrhythmia -Occasional PACs and PVCs, doubt clinically significant    DVT prophylaxis: Lovenox Code Status: Full code Family Communication: No family present at bedside Disposition Plan: TBD.  Patient is currently taking care of his 5 year old mother with Alzheimer's dementia, unlikely he will be able to care for his wound at home  Procedures:   I&D 6/8, 6/10, 6/11  Antimicrobials:  Vancomycin   Cefepime  Metronidazole  Clindamycin  Doxycycline    Subjective: - no  chest pain, shortness of breath, no abdominal pain, nausea or vomiting.  Does not have much pain in his perineal area  Objective: Vitals:   01/05/18 1018 01/05/18 1042 01/05/18 1136 01/05/18 1139  BP: 111/70  (!) 151/76   Pulse: 68     Resp: 16  18   Temp:  (!) 97.2 F (36.2 C) 97.6 F (36.4 C)   TempSrc:   Oral   SpO2: 99%  95%   Weight:    (!) 155.5 kg (342 lb 13 oz)  Height:    6' 1.5" (1.867 m)    Intake/Output Summary (Last 24 hours) at 01/05/2018 1255 Last data filed at 01/05/2018 1021 Gross per 24 hour  Intake 1645.67 ml  Output 250 ml  Net 1395.67 ml   Filed Weights   01/02/18 0351 01/02/18 0634 01/05/18 1139  Weight: (!) 147 kg (324 lb 1.2 oz) (!) 147 kg (324 lb 1.2 oz) (!) 155.5 kg (342 lb 13 oz)    Examination:  Constitutional: NAD Eyes: lids and conjunctivae normal ENMT: Mucous membranes are moist.  Neck: normal, supple Respiratory: clear to auscultation bilaterally, no wheezing, no crackles. Normal respiratory effort. Cardiovascular: Regular rate and rhythm, no murmurs / rubs / gallops. No LE edema. 2+ pedal pulses. No carotid bruits.  Abdomen: no tenderness. Bowel sounds positive.  Neurologic: non focal   Data Reviewed: I have independently reviewed following labs and imaging studies   CBC: Recent Labs  Lab 01/01/18 1835 01/02/18 0631 01/03/18 0154 01/04/18 0310  WBC 18.5* 24.7* 18.8* 22.3*  NEUTROABS 17.0*  --   --   --   HGB 14.0 15.0 11.7* 12.4*  HCT 40.6 42.9 33.9* 35.7*  MCV 85.7 85.0 85.6 85.0  PLT 356 247 342 573   Basic Metabolic Panel: Recent Labs  Lab 01/01/18 1835 01/02/18 0631 01/03/18 0154 01/04/18 0310  NA 137 137 139 138  K 2.9* 3.4* 3.1* 3.6  CL 98* 105 107 107  CO2 27 20* 23 24  GLUCOSE 97 100* 90 99  BUN 43* 47* 41* 29*  CREATININE 2.14* 1.90* 1.56* 1.28*  CALCIUM 8.5* 7.8* 7.9* 8.4*  MG  --   --  1.5* 1.6*   GFR: Estimated Creatinine Clearance: 92.7 mL/min (A) (by C-G formula based on SCr of 1.28 mg/dL  (H)). Liver Function Tests: Recent Labs  Lab 01/01/18 1835  AST 25  ALT 20  ALKPHOS 64  BILITOT 0.9  PROT 6.2*  ALBUMIN 2.8*   No results for input(s): LIPASE, AMYLASE in the last 168 hours. No results for input(s): AMMONIA in the last 168 hours. Coagulation Profile: No results for input(s): INR, PROTIME in the last 168 hours. Cardiac Enzymes: No results for input(s): CKTOTAL, CKMB, CKMBINDEX, TROPONINI in the last 168 hours. BNP (last 3 results) No results for input(s): PROBNP in the last 8760 hours. HbA1C: No results for input(s): HGBA1C in the last 72 hours. CBG: No results for input(s): GLUCAP in the last 168 hours. Lipid Profile: No results for input(s): CHOL, HDL, LDLCALC, TRIG, CHOLHDL, LDLDIRECT in the last 72 hours. Thyroid Function Tests: Recent Labs    01/03/18 0154  TSH 0.949   Anemia Panel: No results for input(s): VITAMINB12, FOLATE, FERRITIN, TIBC, IRON, RETICCTPCT in the last 72 hours. Urine analysis:    Component Value Date/Time   COLORURINE AMBER (A) 01/02/2018 1123   APPEARANCEUR HAZY (A) 01/02/2018 1123   LABSPEC 1.019 01/02/2018 1123   PHURINE 5.0 01/02/2018 1123   GLUCOSEU NEGATIVE 01/02/2018 1123   HGBUR SMALL (A) 01/02/2018 1123   BILIRUBINUR NEGATIVE 01/02/2018 1123   KETONESUR NEGATIVE 01/02/2018 1123   PROTEINUR NEGATIVE 01/02/2018 1123   UROBILINOGEN 0.2 03/29/2017 1643   NITRITE NEGATIVE 01/02/2018 1123   LEUKOCYTESUR SMALL (A) 01/02/2018 1123   Sepsis Labs: Invalid input(s): PROCALCITONIN, LACTICIDVEN  Recent Results (from the past 240 hour(s))  Blood culture (routine x 2)     Status: None (Preliminary result)   Collection Time: 01/01/18  6:35 PM  Result Value Ref Range Status   Specimen Description BLOOD RIGHT ANTECUBITAL  Final   Special Requests   Final    BOTTLES DRAWN AEROBIC AND ANAEROBIC Blood Culture adequate volume   Culture   Final    NO GROWTH 4 DAYS Performed at [facility], [address],  [place], [place] [address]    Report Status PENDING  Incomplete  Blood culture (routine x 2)     Status: None (Preliminary result)   Collection Time: 01/01/18  6:35 PM  Result Value Ref Range Status   Specimen Description BLOOD LEFT ANTECUBITAL  Final   Special Requests   Final    BOTTLES DRAWN AEROBIC AND ANAEROBIC Blood Culture adequate volume   Culture   Final    NO GROWTH 4 DAYS Performed at [facility], [place] [address], [place],  [address]    Report Status PENDING  Incomplete  Aerobic/Anaerobic Culture (surgical/deep wound)     Status: None (Preliminary result)   Collection Time: 01/02/18  1:50 AM  Result Value Ref Range Status   Specimen Description ABSCESS PERIRECTAL  Final   Special Requests NONE  Final   Gram Stain   Final    RARE WBC PRESENT, PREDOMINANTLY  PMN MODERATE GRAM POSITIVE COCCI IN PAIRS FEW GRAM POSITIVE RODS FEW GRAM NEGATIVE RODS    Culture   Final    MODERATE ESCHERICHIA COLI HOLDING FOR POSSIBLE ANAEROBE CULTURE REINCUBATED FOR BETTER GROWTH Performed at [facility], [place] [address], [place], [place] [address]    Report Status PENDING  Incomplete   Organism ID, Bacteria ESCHERICHIA COLI  Final      Susceptibility   Escherichia coli - MIC*    AMPICILLIN 8 SENSITIVE Sensitive     CEFAZOLIN <=4 SENSITIVE Sensitive     CEFEPIME <=1 SENSITIVE Sensitive     CEFTAZIDIME <=1 SENSITIVE Sensitive     CEFTRIAXONE <=1 SENSITIVE Sensitive     CIPROFLOXACIN <=0.25 SENSITIVE Sensitive     GENTAMICIN <=1 SENSITIVE Sensitive     IMIPENEM <=0.25 SENSITIVE Sensitive     TRIMETH/SULFA <=20 SENSITIVE Sensitive     AMPICILLIN/SULBACTAM 4 SENSITIVE Sensitive     PIP/TAZO <=4 SENSITIVE Sensitive     Extended ESBL NEGATIVE Sensitive     * MODERATE ESCHERICHIA COLI  Fungus Stain     Status: None   Collection Time: 01/02/18  1:50 AM  Result Value Ref Range Status   FUNGUS STAIN REFERT  Final    Comment: (NOTE) Please refer to the following specimen for  additional lab results.      Reference [phone] for Fungus Stain results      that are included in test# [id number] Performed At: [facility] [place], [place] [id number] Rush Farmer MD BV:[id number]    Fungal Source ABSCESS  Final    Comment: PERIRECTAL  Culture, Urine     Status: None   Collection Time: 01/02/18  9:06 PM  Result Value Ref Range Status   Specimen Description URINE, CLEAN CATCH  Final   Special Requests NONE  Final   Culture   Final    NO GROWTH Performed at [facility], [place] [address], [place], [place] [address]    Report Status 01/04/2018 FINAL  Final  Aerobic/Anaerobic Culture (surgical/deep wound)     Status: None (Preliminary result)   Collection Time: 01/04/18 12:13 PM  Result Value Ref Range Status   Specimen Description WOUND  Final   Special Requests BUTTOCKS  Final   Gram Stain   Final    RARE WBC PRESENT, PREDOMINANTLY PMN MODERATE GRAM VARIABLE ROD FEW GRAM POSITIVE COCCI IN PAIRS Performed at [facility], [place] [address], [place], [place] [address]    Culture PENDING  Incomplete   Report Status PENDING  Incomplete      Radiology Studies: No results found.   Scheduled Meds: . bethanechol  50 mg Oral TID  . docusate sodium  100 mg Oral BID  . doxycycline  100 mg Oral Q12H  . enoxaparin (LOVENOX) injection  40 mg Subcutaneous Q24H  . polyethylene glycol  17 g Oral Daily  . tamsulosin  0.4 mg Oral Daily   Continuous Infusions: . ceFEPime (MAXIPIME) IV Stopped (01/05/18 1228)  . lactated ringers 10 mL/hr at 01/05/18 1201  . lactated ringers 10 mL/hr at 01/05/18 1201  . metronidazole 500 mg (01/05/18 1203)    Marzetta Board, MD, PhD [facility] Pager [phone]  If 7PM-7AM, please contact night-coverage [email or web address] Password [facility] 01/05/2018, 12:55 PM

## 2018-01-05 NOTE — Transfer of Care (Signed)
Immediate Anesthesia Transfer of Care Note  Patient: Brandon Marks  Procedure(s) Performed: Jasmine December UNDER ANESTHESIA (N/A Anus) DEBRIDEMENT BUTTOCKS ABSCESS (N/A Anus)  Patient Location: PACU  Anesthesia Type:General  Level of Consciousness: awake and alert   Airway & Oxygen Therapy: Patient Spontanous Breathing and Patient connected to nasal cannula oxygen  Post-op Assessment: Report given to RN and Post -op Vital signs reviewed and stable  Post vital signs: Reviewed and stable  Last Vitals:  Vitals Value Taken Time  BP 111/70 01/05/2018 10:18 AM  Temp 36.2 C 01/05/2018 10:17 AM  Pulse 68 01/05/2018 10:21 AM  Resp 19 01/05/2018 10:21 AM  SpO2 97 % 01/05/2018 10:21 AM  Vitals shown include unvalidated device data.  Last Pain:  Vitals:   01/05/18 1017  TempSrc:   PainSc: 0-No pain      Patients Stated Pain Goal: 2 (95/28/41 3244)  Complications: No apparent anesthesia complications

## 2018-01-05 NOTE — Consult Note (Signed)
[facility] Nurse wound consult note WOC consult was requested prior to surgical team involvement. Pt went back to the OR today for a dressing change for the buttocks wound and further debridement.  Please refer to surgical team for further plan of care. Please re-consult if further assistance is needed.  Thank-you,  Julien Girt MSN, [place], [place], [place], [place]

## 2018-01-05 NOTE — Progress Notes (Signed)
Patient placed on nasal CPAP for HS using room air and 7 cmH20 as per patient's home regimen.  Patient is familiar with equipment and procedure.

## 2018-01-05 NOTE — Progress Notes (Signed)
Patient transported to OR for Dressing change under anesthesia and debridement of abscess wound. Report called in to nurse Sam, RN with all questions answered.

## 2018-01-05 NOTE — Anesthesia Procedure Notes (Signed)
Procedure Name: LMA Insertion Date/Time: 01/05/2018 9:17 AM Performed by: Valda Favia, CRNA Pre-anesthesia Checklist: Patient identified, Emergency Drugs available, Suction available and Patient being monitored Patient Re-evaluated:Patient Re-evaluated prior to induction Oxygen Delivery Method: Circle System Utilized Preoxygenation: Pre-oxygenation with 100% oxygen Induction Type: IV induction LMA: LMA inserted LMA Size: 5.0 Number of attempts: 1 Placement Confirmation: positive ETCO2 Tube secured with: Tape Dental Injury: Teeth and Oropharynx as per pre-operative assessment

## 2018-01-05 NOTE — Progress Notes (Signed)
Day of Surgery   Subjective/Chief Complaint: Feeling better, was able to urinate last night but had trouble again today, less buttock pain   Objective: Vital signs in last 24 hours: Temp:  [97.2 F (36.2 C)-98.6 F (37 C)] 98.6 F (37 C) (06/11 0800) Pulse Rate:  [68-76] 76 (06/11 0800) Resp:  [0-20] 18 (06/11 0800) BP: (125-156)/(60-81) 153/81 (06/11 0800) SpO2:  [93 %-97 %] 93 % (06/11 0800) Last BM Date: 01/05/18  Intake/Output from previous day: 06/10 0701 - 06/11 0700 In: 1645.7 [P.O.:240; I.V.:705.7; IV Piggyback:700] Out: 290 [Urine:250; Blood:40] Intake/Output this shift: No intake/output data recorded.  General appearance: alert and cooperative Resp: clear to auscultation bilaterally Cardio: regular rate and rhythm Male genitalia: will re-inspect tick bite site in OR Buttock wound dressed Lab Results:  Recent Labs    01/03/18 0154 01/04/18 0310  WBC 18.8* 22.3*  HGB 11.7* 12.4*  HCT 33.9* 35.7*  PLT 342 372   BMET Recent Labs    01/03/18 0154 01/04/18 0310  NA 139 138  K 3.1* 3.6  CL 107 107  CO2 23 24  GLUCOSE 90 99  BUN 41* 29*  CREATININE 1.56* 1.28*  CALCIUM 7.9* 8.4*   PT/INR No results for input(s): LABPROT, INR in the last 72 hours. ABG No results for input(s): PHART, HCO3 in the last 72 hours.  Invalid input(s): PCO2, PO2  Studies/Results: No results found.  Anti-infectives: Anti-infectives (From admission, onward)   Start     Dose/Rate Route Frequency Ordered Stop   01/04/18 2200  [MAR Hold]  ceFEPIme (MAXIPIME) 2 g in sodium chloride 0.9 % 100 mL IVPB     (MAR Hold since Tue 01/05/2018 at 0840. Reason: Transfer to a Procedural area.)  Note to Pharmacy:  Pharm to dose   2 g 200 mL/hr over 30 Minutes Intravenous Every 12 hours 01/04/18 1428     01/04/18 1600  [MAR Hold]  clindamycin (CLEOCIN) IVPB 600 mg     (MAR Hold since Tue 01/05/2018 at [place]. Reason: Transfer to a Procedural area.)   600 mg 100 mL/hr over 30 Minutes  Intravenous Every 8 hours 01/04/18 1505     01/04/18 1515  [MAR Hold]  doxycycline (VIBRA-TABS) tablet 100 mg     (MAR Hold since Tue 01/05/2018 at [place]. Reason: Transfer to a Procedural area.)   100 mg Oral Every 12 hours 01/04/18 1505     01/04/18 1145  ceFEPIme (MAXIPIME) 2 g in sodium chloride 0.9 % 100 mL IVPB    Note to Pharmacy:  Pharm to dose   2 g 200 mL/hr over 30 Minutes Intravenous To Surgery 01/04/18 1131 01/04/18 1155   01/04/18 1145  metronidazole (FLAGYL) IVPB 500 mg  Status:  Discontinued     500 mg 100 mL/hr over 60 Minutes Intravenous To Surgery 01/04/18 1131 01/04/18 1412   01/03/18 1000  [MAR Hold]  metroNIDAZOLE (FLAGYL) IVPB 500 mg     (MAR Hold since Tue 01/05/2018 at [place]. Reason: Transfer to a Procedural area.)   500 mg 100 mL/hr over 60 Minutes Intravenous Every 8 hours 01/03/18 0956     01/02/18 1000  ceFEPIme (MAXIPIME) 2 g in sodium chloride 0.9 % 100 mL IVPB  Status:  Discontinued    Note to Pharmacy:  Pharm to dose   2 g 200 mL/hr over 30 Minutes Intravenous Every 12 hours 01/02/18 0340 01/04/18 1131   01/02/18 0600  [MAR Hold]  vancomycin (VANCOCIN) IVPB 1000 mg/200 mL premix     (  MAR Hold since Tue 01/05/2018 at [place]. Reason: Transfer to a Procedural area.)   1,000 mg 200 mL/hr over 60 Minutes Intravenous Every 12 hours 01/02/18 0409     01/01/18 2330  ceFEPIme (MAXIPIME) 2 g in sodium chloride 0.9 % 100 mL IVPB     2 g 200 mL/hr over 30 Minutes Intravenous  Once 01/01/18 2321 01/02/18 0038   01/01/18 2315  vancomycin (VANCOCIN) 2,000 mg in sodium chloride 0.9 % 500 mL IVPB     2,000 mg 250 mL/hr over 120 Minutes Intravenous  Once 01/01/18 2313 01/02/18 0328   01/01/18 1945  cefTRIAXone (ROCEPHIN) 1 g in sodium chloride 0.9 % 100 mL IVPB     1 g 200 mL/hr over 30 Minutes Intravenous  Once 01/01/18 1932 01/01/18 2202   01/01/18 1945  metroNIDAZOLE (FLAGYL) IVPB 500 mg     500 mg 100 mL/hr over 60 Minutes Intravenous  Once 01/01/18 1932 01/01/18 2317       Assessment/Plan: Necrotizing perirectal soft tissue infection - back to OR for exam under anesthesia and possible further debridement as needed. I discussed the procedure, risks, and benefits with him. He agrees.  ID - doxy for tick bite, will eval LOT with clinda in OR. Continue vanc/cefepime/flagyl.  Acute urinary retention - increase urecholine and continue flomax  OSA - CPAP at HS  LOS: 3 days    Zenovia Jarred 01/05/2018

## 2018-01-05 NOTE — Progress Notes (Signed)
Patient returned from OR. Alert and in stable condition.

## 2018-01-05 NOTE — Anesthesia Preprocedure Evaluation (Addendum)
Anesthesia Evaluation  Patient identified by MRN, date of birth, ID band Patient awake    Reviewed: Allergy & Precautions, H&P , NPO status , Patient's Chart, lab work & pertinent test results  History of Anesthesia Complications Negative for: history of anesthetic complications  Airway Mallampati: II  TM Distance: >3 FB Neck ROM: Full    Dental  (+) Teeth Intact, Dental Advisory Given, Poor Dentition   Pulmonary shortness of breath, sleep apnea ,    Pulmonary exam normal breath sounds clear to auscultation       Cardiovascular hypertension, Pt. on medications Normal cardiovascular exam Rhythm:Regular Rate:Normal     Neuro/Psych negative neurological ROS  negative psych ROS   GI/Hepatic Neg liver ROS, GERD  Medicated,  Endo/Other  Morbid obesity  Renal/GU negative Renal ROS  negative genitourinary   Musculoskeletal  (+) Arthritis , Osteoarthritis,    Abdominal   Peds  Hematology negative hematology ROS (+) Blood dyscrasia, anemia ,   Anesthesia Other Findings S/p recent gastric bypass surgery at [facility]  Reproductive/Obstetrics                            Anesthesia Physical  Anesthesia Plan  ASA: III  Anesthesia Plan: General   Post-op Pain Management:    Induction: Intravenous  PONV Risk Score and Plan: 2 and Ondansetron and Dexamethasone  Airway Management Planned: LMA  Additional Equipment: None  Intra-op Plan:   Post-operative Plan:   Informed Consent: I have reviewed the patients History and Physical, chart, labs and discussed the procedure including the risks, benefits and alternatives for the proposed anesthesia with the patient or authorized representative who has indicated his/her understanding and acceptance.   Dental advisory given  Plan Discussed with: CRNA and Surgeon  Anesthesia Plan Comments:         Anesthesia Quick Evaluation

## 2018-01-05 NOTE — Care Management Important Message (Signed)
Important Message  Patient Details  Name: Brandon Marks MRN: [id number] Date of Birth: [birth year]   Medicare Important Message Given:  Yes    Orbie Pyo 01/05/2018, 1:11 PM

## 2018-01-05 NOTE — Op Note (Signed)
01/05/2018  10:09 AM  PATIENT:  Brandon Marks  62 y.o. male  PRE-OPERATIVE DIAGNOSIS:  Buttocks abscess  POST-OPERATIVE DIAGNOSIS:  Buttocks abscess  PROCEDURE:  Procedure(s): EXAM UNDER ANESTHESIA DEBRIDEMENT BUTTOCKS ABSCESS  SURGEON:  Surgeon(s): Georganna Skeans, MD  ASSISTANTS: none   ANESTHESIA:   local and general  EBL:  No intake/output data recorded.  BLOOD ADMINISTERED:none  DRAINS: none   SPECIMEN:  Excision  DISPOSITION OF SPECIMEN:  PATHOLOGY  COUNTS:  YES  DICTATION: .Dragon Dictation 1.  Progress note or procedure note with a detailed description of the procedure. Mr. Revelo is back to the operating room for follow-up examination under anesthesia and further debridement of buttock wound.  He is on IV antibiotics.  Informed consent was obtained.  He was brought to the operating room and general anesthesia was administered by the anesthesia staff.  He was placed in lithotomy position.  First, I examined the tick bite site on his right scrotum.  There was no rash.  No signs of infection.  Next his old dressing and packing were removed from his buttock wound and that whole area was prepped and draped in a sterile fashion.  We did a timeout procedure.  His wound was explored.  There was some further necrotic tissue in the deep portion.  Initial dimensions as below.  I debrided all the visible necrotic tissue using scissors and cautery.  Achieved hemostasis using cautery and 2-0 silk suture ligatures.  Local anesthetic was injected.  The wound was then copiously irrigated with several liters of saline.  There was good hemostasis.  I then packed the wound with a sterile saline soaked Kerlix.  This was covered with bulky sterile gauze dressing.  All counts were correct.  He tolerated the procedure well without apparent complication and was taken recovery in stable condition.  Final wound dimensions as below. 2.  Tool used for debridement (curette, scapel, etc.)  Scissors  and cautery  3.  Frequency of surgical debridement.   Third time  4.  Measurement of total devitalized tissue (wound surface) before and after surgical debridement.   Before - 11cmx6cmx7cm deep. Afetr 11cmx6cmx8cm deep  5.  Area and depth of devitalized tissue removed from wound.  above  6.  Blood loss and description of tissue removed. EBL 20cc, necrotic tissue  7.  Evidence of the progress of the wound's response to treatment.  A.  Current wound volume (current dimensions and depth).  11cm x 6cm x 8cm deep  B.  Presence (and extent of) of infection.  present  C.  Presence (and extent of) of non viable tissue. Removed as above  D.  Other material in the wound that is expected to inhibit healing.  no  8.  Was there any viable tissue removed (measurements): minimal  PATIENT DISPOSITION:  PACU - hemodynamically stable.   Delay start of Pharmacological VTE agent (>24hrs) due to surgical blood loss or risk of bleeding:  no  Georganna Skeans, MD, MPH, FACS Pager: [phone]  6/11/201910:09 AM

## 2018-01-05 NOTE — Progress Notes (Signed)
CPAP patient places on self

## 2018-01-06 ENCOUNTER — Encounter (HOSPITAL_COMMUNITY): Payer: Self-pay | Admitting: General Surgery

## 2018-01-06 LAB — BASIC METABOLIC PANEL
ANION GAP: 7 (ref 5–15)
BUN: 30 mg/dL — ABNORMAL HIGH (ref 6–20)
CALCIUM: 8.4 mg/dL — AB (ref 8.9–10.3)
CO2: 24 mmol/L (ref 22–32)
Chloride: 109 mmol/L (ref 101–111)
Creatinine, Ser: 1.17 mg/dL (ref 0.61–1.24)
GFR calc non Af Amer: >60 mL/min (ref >60)
Glucose, Bld: 95 mg/dL (ref 65–99)
Potassium: 3.6 mmol/L (ref 3.5–5.1)
Sodium: 140 mmol/L (ref 135–145)

## 2018-01-06 LAB — CULTURE, BLOOD (ROUTINE X 2)
CULTURE: NO GROWTH
CULTURE: NO GROWTH
Special Requests: ADEQUATE
Special Requests: ADEQUATE

## 2018-01-06 LAB — CBC
HCT: 36.9 % — ABNORMAL LOW (ref 39.0–52.0)
HEMOGLOBIN: 12.5 g/dL — AB (ref 13.0–17.0)
MCH: 29.3 pg (ref 26.0–34.0)
MCHC: 33.9 g/dL (ref 30.0–36.0)
MCV: 86.6 fL (ref 78.0–100.0)
Platelets: 432 10*3/uL — ABNORMAL HIGH (ref 150–400)
RBC: 4.26 MIL/uL (ref 4.22–5.81)
RDW: 15.9 % — ABNORMAL HIGH (ref 11.5–15.5)
WBC: 22.7 10*3/uL — AB (ref 4.0–10.5)

## 2018-01-06 MED ORDER — PIPERACILLIN-TAZOBACTAM 3.375 G IVPB 30 MIN
3.3750 g | Freq: Once | INTRAVENOUS | Status: DC
  Filled 2018-01-06: qty 50

## 2018-01-06 MED ORDER — PIPERACILLIN-TAZOBACTAM 3.375 G IVPB
3.3750 g | Freq: Three times a day (TID) | INTRAVENOUS | Status: DC
  Filled 2018-01-06 (×2): qty 50

## 2018-01-06 MED ORDER — HYDROCHLOROTHIAZIDE 25 MG PO TABS
25.0000 mg | ORAL_TABLET | Freq: Every day | ORAL | Status: DC
  Administered 2018-01-06 – 2018-01-08 (×3): 25 mg via ORAL
  Filled 2018-01-06 (×3): qty 1

## 2018-01-06 MED ORDER — LISINOPRIL 10 MG PO TABS
10.0000 mg | ORAL_TABLET | Freq: Every day | ORAL | Status: DC
  Administered 2018-01-06 – 2018-01-12 (×7): 10 mg via ORAL
  Filled 2018-01-06 (×7): qty 1

## 2018-01-06 MED ORDER — PIPERACILLIN-TAZOBACTAM 3.375 G IVPB
3.3750 g | Freq: Three times a day (TID) | INTRAVENOUS | Status: DC
  Administered 2018-01-06 – 2018-01-10 (×11): 3.375 g via INTRAVENOUS
  Filled 2018-01-06 (×13): qty 50

## 2018-01-06 NOTE — Progress Notes (Signed)
1 Day Post-Op   Subjective/Chief Complaint: Not eating much, buttock pain less   Objective: Vital signs in last 24 hours: Temp:  [97.6 F (36.4 C)-98.1 F (36.7 C)] 97.8 F (36.6 C) (06/12 1303) Pulse Rate:  [69-82] 69 (06/12 1303) BP: (140-179)/(59-83) 179/79 (06/12 1303) SpO2:  [97 %-100 %] 98 % (06/12 1303) Last BM Date: 01/05/18  Intake/Output from previous day: 06/11 0701 - 06/12 0700 In: 2024.7 [P.O.:600; I.V.:1024.7; IV Piggyback:400] Out: -  Intake/Output this shift: No intake/output data recorded.  General appearance: cooperative Resp: clear to auscultation bilaterally Cardio: regular rate and rhythm GI: soft, NT, ND Incision/Wound:packing removed, mostly clean and viable, no bad odor  Lab Results:  Recent Labs    01/04/18 0310 01/06/18 1107  WBC 22.3* 22.7*  HGB 12.4* 12.5*  HCT 35.7* 36.9*  PLT 372 432*   BMET Recent Labs    01/04/18 0310 01/06/18 1107  NA 138 140  K 3.6 3.6  CL 107 109  CO2 24 24  GLUCOSE 99 95  BUN 29* 30*  CREATININE 1.28* 1.17  CALCIUM 8.4* 8.4*   PT/INR No results for input(s): LABPROT, INR in the last 72 hours. ABG No results for input(s): PHART, HCO3 in the last 72 hours.  Invalid input(s): PCO2, PO2  Studies/Results: No results found.  Anti-infectives: Anti-infectives (From admission, onward)   Start     Dose/Rate Route Frequency Ordered Stop   01/06/18 1800  piperacillin-tazobactam (ZOSYN) IVPB 3.375 g     3.375 g 12.5 mL/hr over 240 Minutes Intravenous Every 8 hours 01/06/18 1540     01/04/18 2200  ceFEPIme (MAXIPIME) 2 g in sodium chloride 0.9 % 100 mL IVPB  Status:  Discontinued    Note to Pharmacy:  Pharm to dose   2 g 200 mL/hr over 30 Minutes Intravenous Every 12 hours 01/04/18 1428 01/06/18 1537   01/04/18 1600  clindamycin (CLEOCIN) IVPB 600 mg  Status:  Discontinued     600 mg 100 mL/hr over 30 Minutes Intravenous Every 8 hours 01/04/18 1505 01/05/18 1145   01/04/18 1515  doxycycline  (VIBRA-TABS) tablet 100 mg     100 mg Oral Every 12 hours 01/04/18 1505     01/04/18 1145  ceFEPIme (MAXIPIME) 2 g in sodium chloride 0.9 % 100 mL IVPB    Note to Pharmacy:  Pharm to dose   2 g 200 mL/hr over 30 Minutes Intravenous To Surgery 01/04/18 1131 01/05/18 1143   01/04/18 1145  metronidazole (FLAGYL) IVPB 500 mg  Status:  Discontinued     500 mg 100 mL/hr over 60 Minutes Intravenous To Surgery 01/04/18 1131 01/04/18 1412   01/03/18 1000  metroNIDAZOLE (FLAGYL) IVPB 500 mg  Status:  Discontinued     500 mg 100 mL/hr over 60 Minutes Intravenous Every 8 hours 01/03/18 0956 01/06/18 1537   01/02/18 1000  ceFEPIme (MAXIPIME) 2 g in sodium chloride 0.9 % 100 mL IVPB  Status:  Discontinued    Note to Pharmacy:  Pharm to dose   2 g 200 mL/hr over 30 Minutes Intravenous Every 12 hours 01/02/18 0340 01/04/18 1131   01/02/18 0600  vancomycin (VANCOCIN) IVPB 1000 mg/200 mL premix  Status:  Discontinued     1,000 mg 200 mL/hr over 60 Minutes Intravenous Every 12 hours 01/02/18 0409 01/05/18 1102   01/01/18 2330  ceFEPIme (MAXIPIME) 2 g in sodium chloride 0.9 % 100 mL IVPB     2 g 200 mL/hr over 30 Minutes Intravenous  Once 01/01/18  2321 01/02/18 0038   01/01/18 2315  vancomycin (VANCOCIN) 2,000 mg in sodium chloride 0.9 % 500 mL IVPB     2,000 mg 250 mL/hr over 120 Minutes Intravenous  Once 01/01/18 2313 01/05/18 1144   01/01/18 1945  cefTRIAXone (ROCEPHIN) 1 g in sodium chloride 0.9 % 100 mL IVPB     1 g 200 mL/hr over 30 Minutes Intravenous  Once 01/01/18 1932 01/01/18 2202   01/01/18 1945  metroNIDAZOLE (FLAGYL) IVPB 500 mg     500 mg 100 mL/hr over 60 Minutes Intravenous  Once 01/01/18 1932 01/01/18 2317     Results for orders placed or performed during the hospital encounter of 01/01/18  Blood culture (routine x 2)     Status: None   Collection Time: 01/01/18  6:35 PM  Result Value Ref Range Status   Specimen Description BLOOD RIGHT ANTECUBITAL  Final   Special Requests   Final     BOTTLES DRAWN AEROBIC AND ANAEROBIC Blood Culture adequate volume   Culture   Final    NO GROWTH 5 DAYS Performed at [facility], [place] [address], [place], [place] [address]    Report Status 01/06/2018 FINAL  Final  Blood culture (routine x 2)     Status: None   Collection Time: 01/01/18  6:35 PM  Result Value Ref Range Status   Specimen Description BLOOD LEFT ANTECUBITAL  Final   Special Requests   Final    BOTTLES DRAWN AEROBIC AND ANAEROBIC Blood Culture adequate volume   Culture   Final    NO GROWTH 5 DAYS Performed at [facility], [place] [address], [place], [place] [address]    Report Status 01/06/2018 FINAL  Final  Fungus Culture With Stain     Status: None (Preliminary result)   Collection Time: 01/02/18  1:50 AM  Result Value Ref Range Status   Fungus Stain Final report  Final    Comment: (NOTE) Performed At: [facility] [place], [place] [id number] Rush Farmer MD WU:[id number]    Fungus (Mycology) Culture PENDING  Incomplete   Fungal Source ABSCESS  Final    Comment: PERIRECTAL  Aerobic/Anaerobic Culture (surgical/deep wound)     Status: None (Preliminary result)   Collection Time: 01/02/18  1:50 AM  Result Value Ref Range Status   Specimen Description ABSCESS PERIRECTAL  Final   Special Requests NONE  Final   Gram Stain   Final    RARE WBC PRESENT, PREDOMINANTLY PMN MODERATE GRAM POSITIVE COCCI IN PAIRS FEW GRAM POSITIVE RODS FEW GRAM NEGATIVE RODS Performed at [facility], [place] [address], [place], [place] [address]    Culture   Final    MODERATE ESCHERICHIA COLI MODERATE ACTINOMYCES SPECIES Standardized susceptibility testing for this organism is not available. MIXED ANAEROBIC FLORA PRESENT.  CALL LAB IF FURTHER IID REQUIRED.    Report Status PENDING  Incomplete   Organism ID, Bacteria ESCHERICHIA COLI  Final      Susceptibility   Escherichia coli - MIC*    AMPICILLIN 8 SENSITIVE Sensitive     CEFAZOLIN <=4  SENSITIVE Sensitive     CEFEPIME <=1 SENSITIVE Sensitive     CEFTAZIDIME <=1 SENSITIVE Sensitive     CEFTRIAXONE <=1 SENSITIVE Sensitive     CIPROFLOXACIN <=0.25 SENSITIVE Sensitive     GENTAMICIN <=1 SENSITIVE Sensitive     IMIPENEM <=0.25 SENSITIVE Sensitive     TRIMETH/SULFA <=20 SENSITIVE Sensitive     AMPICILLIN/SULBACTAM 4 SENSITIVE  Sensitive     PIP/TAZO <=4 SENSITIVE Sensitive     Extended ESBL NEGATIVE Sensitive     * MODERATE ESCHERICHIA COLI  Fungus Stain     Status: None   Collection Time: 01/02/18  1:50 AM  Result Value Ref Range Status   FUNGUS STAIN REFERT  Final    Comment: (NOTE) Please refer to the following specimen for additional lab results.      Reference [phone] for Fungus Stain results      that are included in test# [id number] Performed At: [facility] [place], [place] [id number] Rush Farmer MD EZ:[id number]    Fungal Source ABSCESS  Final    Comment: PERIRECTAL  Fungus Culture Result     Status: None   Collection Time: 01/02/18  1:50 AM  Result Value Ref Range Status   Result 1 Comment  Final    Comment: (NOTE) KOH/Calcofluor preparation:  no fungus observed. Performed At: [facility] [place], [place] [id number] Rush Farmer MD LE:[id number]   Culture, Urine     Status: None   Collection Time: 01/02/18  9:06 PM  Result Value Ref Range Status   Specimen Description URINE, CLEAN CATCH  Final   Special Requests NONE  Final   Culture   Final    NO GROWTH Performed at [facility], [place] [address], [place], [place] [address]    Report Status 01/04/2018 FINAL  Final  Aerobic/Anaerobic Culture (surgical/deep wound)     Status: None (Preliminary result)   Collection Time: 01/04/18 12:13 PM  Result Value Ref Range Status   Specimen Description WOUND  Final   Special Requests BUTTOCKS  Final   Gram Stain   Final    RARE WBC PRESENT, PREDOMINANTLY PMN MODERATE GRAM VARIABLE ROD FEW GRAM  POSITIVE COCCI IN PAIRS Performed at [facility], [place] [address], [place], [place] [address]    Culture   Final    FEW GRAM NEGATIVE RODS IDENTIFICATION AND SUSCEPTIBILITIES TO FOLLOW NO ANAEROBES ISOLATED; CULTURE IN PROGRESS FOR 5 DAYS    Report Status PENDING  Incomplete     Assessment/Plan: Necrotizing perirectal soft tissue infection - dressing changed, wound much better, start hydrotherapy tomorrow  ID - doxy for tick bite, change to Zosyn for e coli and actinomyces, new CX pending  Acute urinary retention - better on urecholine and flomax  OSA - CPAP at HS  LOS: 4 days    Brandon Marks 01/06/2018

## 2018-01-06 NOTE — NC FL2 (Addendum)
[place] LEVEL OF CARE SCREENING TOOL     IDENTIFICATION  Patient Name: Brandon Marks Birthdate: September 29, 1955 Sex: male Admission Date (Current Location): 01/01/2018  [facility] and [place] Number:  Herbalist and Address:  [facility] [place], [place] [address], [place], [place] [address]      Provider Number: [id number]  Attending Physician Name and Address:  Edison Pace Md, MD  Relative Name and Phone Number:  Sorin Frimpong, mother, [phone]    Current Level of Care: Hospital Recommended Level of Care: [place] Prior Approval Number:    Date Approved/Denied:   PASRR Number:   [id number]  Discharge Plan: SNF    Current Diagnoses: Patient Active Problem List   Diagnosis Date Noted  . Necrotizing fasciitis ([place]) 01/02/2018  . Super obesity 01/20/2015  . High cholesterol 01/20/2015  . Chest pain 02/27/2012  . Obesity, morbid ([place]) 02/27/2012  . Dyspnea, chronic DOE 02/27/2012  . Herniated disc   . HYPERLIPIDEMIA 02/23/2009  . 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 Essential hypertension 10/18/2007  . Obstructive sleep apnea 10/11/2007    Orientation RESPIRATION BLADDER Height & Weight     Self, Time, Situation, Place  Normal Continent Weight: (!) 342 lb 13 oz (155.5 kg) Height:  6' 1.5" (186.7 cm)  BEHAVIORAL SYMPTOMS/MOOD NEUROLOGICAL BOWEL NUTRITION STATUS      Incontinent Diet(see discharge summary)  AMBULATORY STATUS COMMUNICATION OF NEEDS Skin   Limited Assist Verbally Surgical wounds(surgical incision on rectum with gauze dressing & ABD pads changed PRN)                       Personal Care Assistance Level of Assistance  Dressing, Feeding, Bathing Bathing Assistance: Limited assistance Feeding assistance: Independent Dressing Assistance: Limited assistance     Functional Limitations Info  Sight, Hearing, Speech Sight Info: Adequate Hearing Info: Adequate Speech Info: Adequate     SPECIAL CARE FACTORS FREQUENCY  PT (By licensed PT), OT (By licensed OT)     PT Frequency: 5x week OT Frequency: 5x week            Contractures Contractures Info: Not present    Additional Factors Info  Code Status, Allergies Code Status Info: Full Code Allergies Info: Codeine, Oxycodone           Current Medications (01/06/2018):  This is the current hospital active medication list Current Facility-Administered Medications  Medication Dose Route Frequency Provider Last Rate Last Dose  . bethanechol (URECHOLINE) tablet 50 mg  50 mg Oral TID Georganna Skeans, MD   50 mg at 01/06/18 0959  . ceFEPIme (MAXIPIME) 2 g in sodium chloride 0.9 % 100 mL IVPB  2 g Intravenous Q12H Millen, Jessica B, RPH 200 mL/hr at 01/06/18 1010 2 g at 01/06/18 1010  . docusate sodium (COLACE) capsule 100 mg  100 mg Oral BID Saverio Danker, PA-C   100 mg at 01/06/18 0959  . doxycycline (VIBRA-TABS) tablet 100 mg  100 mg Oral Q12H Georganna Skeans, MD   100 mg at 01/06/18 0959  . enoxaparin (LOVENOX) injection 40 mg  40 mg Subcutaneous Q24H Saverio Danker, PA-C   40 mg at 01/06/18 0900  . hydrochlorothiazide (HYDRODIURIL) tablet 25 mg  25 mg Oral Daily Caren Griffins, MD   25 mg at 01/06/18 1214  . HYDROcodone-acetaminophen (NORCO/VICODIN) 5-325 MG per tablet 1-2 tablet  1-2 tablet Oral Q4H PRN Saverio Danker, PA-C   2 tablet at 01/06/18 0427  .  labetalol (NORMODYNE,TRANDATE) injection 10 mg  10 mg Intravenous Q6H PRN Saverio Danker, PA-C      . lactated ringers infusion   Intravenous Continuous Saverio Danker, PA-C 10 mL/hr at 01/05/18 1201    . lactated ringers infusion   Intravenous Continuous Lyn Hollingshead, MD 10 mL/hr at 01/05/18 1201    . lisinopril (PRINIVIL,ZESTRIL) tablet 10 mg  10 mg Oral Daily Caren Griffins, MD   10 mg at 01/06/18 1214  . methocarbamol (ROBAXIN) tablet 500 mg  500 mg Oral Q6H PRN Saverio Danker, PA-C   500 mg at 01/06/18 0427  . metroNIDAZOLE (FLAGYL) IVPB 500 mg   500 mg Intravenous Q8H Saverio Danker, PA-C 100 mL/hr at 01/06/18 0959 500 mg at 01/06/18 0959  . morphine 2 MG/ML injection 1-4 mg  1-4 mg Intravenous Q2H PRN Saverio Danker, PA-C      . ondansetron (ZOFRAN-ODT) disintegrating tablet 4 mg  4 mg Oral Q6H PRN Saverio Danker, PA-C       Or  . ondansetron [facility]) injection 4 mg  4 mg Intravenous Q6H PRN Saverio Danker, PA-C   4 mg at 01/06/18 1100  . polyethylene glycol (MIRALAX / GLYCOLAX) packet 17 g  17 g Oral Daily Saverio Danker, PA-C   17 g at 01/06/18 1000  . tamsulosin (FLOMAX) capsule 0.4 mg  0.4 mg Oral Daily Saverio Danker, PA-C   0.4 mg at 01/06/18 0959  . traMADol (ULTRAM) tablet 50 mg  50 mg Oral Q6H PRN Saverio Danker, PA-C   50 mg at 01/06/18 3646     Discharge Medications: Please see discharge summary for a list of discharge medications.  Relevant Imaging Results:  Relevant Lab Results:   Additional Information SS# [place], [place]

## 2018-01-06 NOTE — Progress Notes (Signed)
CPAP is set at 7cmh20 which is pt's home settings. Pt had placed him self on the CPAP via nasal mask.    01/06/18 2345  BiPAP/CPAP/SIPAP  BiPAP/CPAP/SIPAP Pt Type Adult  Mask Type Full face mask  Mask Size Medium  Respiratory Rate 18 breaths/min  EPAP 7 cmH2O (home settings)  Oxygen Percent 21 %  BiPAP/CPAP/SIPAP CPAP  Patient Home Equipment No  Auto Titrate No  BiPAP/CPAP /SiPAP Vitals  Pulse Rate 72  Resp 18  SpO2 98 %  Bilateral Breath Sounds Clear

## 2018-01-06 NOTE — Anesthesia Postprocedure Evaluation (Signed)
Anesthesia Post Note  Patient: Brandon Marks  Procedure(s) Performed: Jasmine December UNDER ANESTHESIA (N/A Anus) DEBRIDEMENT BUTTOCKS ABSCESS (N/A Anus)     Patient location during evaluation: PACU Anesthesia Type: General Level of consciousness: awake Pain management: pain level controlled Vital Signs Assessment: post-procedure vital signs reviewed and stable Respiratory status: spontaneous breathing Cardiovascular status: stable Postop Assessment: no apparent nausea or vomiting Anesthetic complications: no    Last Vitals:  Vitals:   01/06/18 1303 01/06/18 1725  BP: (!) 179/79 (!) 166/71  Pulse: 69 68  Resp:    Temp: 36.6 C 36.8 C  SpO2: 98% 100%    Last Pain:  Vitals:   01/06/18 1725  TempSrc: Oral  PainSc:    Pain Goal: Patients Stated Pain Goal: 2 (01/04/18 2340)               Krystelle Prashad JR,JOHN [place]

## 2018-01-06 NOTE — Evaluation (Signed)
Physical Therapy Evaluation Patient Details Name: Brandon Marks MRN: [id number] DOB: Mar 31, 1956 Today's Date: 01/06/2018   History of Present Illness  pt is a 62 y/o male with 1 week h/o perianal pain.  CT showing a necrotizing perirectal abscess.  Pt s/p I and D x2.  PMH:  HTN, gastic bypass surgery.  Clinical Impression  Pt admitted with/for perirectal abscess s/p I and D x2.  Pt mobilizing with minimal assist due to buttock pain.  Expect mobility to improve steadily, but not sure how limited the buttock wound will be..  Pt currently limited functionally due to the problems listed below.  (see problems list.)  Pt will benefit from PT to maximize function and safety to be able to get home safely with limited assist. .     Follow Up Recommendations Home health PT;Other (comment)(vs none if quick to improve)    Equipment Recommendations  Rolling walker with 5" wheels    Recommendations for Other Services       Precautions / Restrictions Precautions Precautions: Fall      Mobility  Bed Mobility Overal bed mobility: Needs Assistance Bed Mobility: Supine to Sit;Sit to Supine     Supine to sit: Min assist Sit to supine: Mod assist   General bed mobility comments: Needing leg assist or scooting assist due to painful buttock  Transfers Overall transfer level: Needs assistance Equipment used: Rolling walker (2 wheeled) Transfers: Sit to/from Stand Sit to Stand: Min guard         General transfer comment: cues for safer transfer technique, pt pulling up on the RW  Ambulation/Gait Ambulation/Gait assistance: Min guard Ambulation Distance (Feet): 80 Feet Assistive device: Rolling walker (2 wheeled) Gait Pattern/deviations: Step-through pattern Gait velocity: moderate Gait velocity interpretation: <1.8 ft/sec, indicate of risk for recurrent falls General Gait Details: pt's gait varies between controlled with good proximity to the RW, though posture flexed and pt  strethching the RW far in front of himself and racing down the hall out of control.  pt was unable to use the cane due to pain.  Stairs            Wheelchair Mobility    Modified Rankin (Stroke Patients Only)       Balance Overall balance assessment: Needs assistance Sitting-balance support: Single extremity supported;No upper extremity supported Sitting balance-Leahy Scale: Fair Sitting balance - Comments: able to sit without support, but with pain.     Standing balance-Leahy Scale: Fair Standing balance comment: statically can stand without assist                             Pertinent Vitals/Pain Pain Assessment: 0-10 Pain Score: 6  Pain Location: perirectal Pain Descriptors / Indicators: Discomfort Pain Intervention(s): Limited activity within patient's tolerance;Monitored during session    Home Living Family/patient expects to be discharged to:: Private residence Living Arrangements: Spouse/significant other;Other (Comment)(his sister in law, both having dementia or alzheimers dement) Available Help at Discharge: Other (Comment)(NONE, brother comeing for the w/e) Type of Home: House Home Access: Stairs to enter   [employer] of Steps: 1 Home Layout: One level Home Equipment: Financial trader - single point Additional Comments: Pt uses scooter for community mobility; Walking stick    Prior Function Level of Independence: Independent with assistive device(s)         Comments: Pt takes care of mother and Aunt     Hand Dominance  Extremity/Trunk Assessment   Upper Extremity Assessment Upper Extremity Assessment: Overall WFL for tasks assessed    Lower Extremity Assessment Lower Extremity Assessment: Overall WFL for tasks assessed       Communication   Communication: No difficulties  Cognition Arousal/Alertness: Awake/alert Behavior During Therapy: WFL for tasks assessed/performed Overall Cognitive Status:  Within Functional Limits for tasks assessed                                        General Comments      Exercises     Assessment/Plan    PT Assessment Patient needs continued PT services  PT Problem List Decreased activity tolerance;Decreased mobility;Decreased coordination;Decreased knowledge of use of DME;Decreased safety awareness;Pain       PT Treatment Interventions Gait training;Stair training;Functional mobility training;Balance training;Patient/family education;DME instruction    PT Goals (Current goals can be found in the Care Plan section)  Acute Rehab PT Goals Patient Stated Goal: be able to care for my Mom and aunt. PT Goal Formulation: With patient Time For Goal Achievement: 01/20/18 Potential to Achieve Goals: Good    Frequency Min 3X/week   Barriers to discharge Decreased caregiver support      Co-evaluation               AM-PAC PT "6 Clicks" Daily Activity  Outcome Measure Difficulty turning over in bed (including adjusting bedclothes, sheets and blankets)?: Unable Difficulty moving from lying on back to sitting on the side of the bed? : Unable Difficulty sitting down on and standing up from a chair with arms (e.g., wheelchair, bedside commode, etc,.)?: Unable Help needed moving to and from a bed to chair (including a wheelchair)?: A Little Help needed walking in hospital room?: A Little Help needed climbing 3-5 steps with a railing? : A Little 6 Click Score: 12    End of Session   Activity Tolerance: Patient limited by pain;Patient limited by fatigue Patient left: in bed;with call bell/phone within reach;with bed alarm set Nurse Communication: Mobility status PT Visit Diagnosis: Other abnormalities of gait and mobility (R26.89);Pain Pain - part of body: (buttock)    Time: 1637-1700 PT Time Calculation (min) (ACUTE ONLY): 23 min   Charges:   PT Evaluation $PT Eval Moderate Complexity: 1 Mod PT Treatments $Gait  Training: 8-22 mins   PT G Codes:        Jan 23, 2018  Donnella Sham, PT [phone]  (pager)  Tessie Fass [place] 01/23/2018, 6:02 PM

## 2018-01-06 NOTE — Progress Notes (Signed)
PROGRESS NOTE  Brandon Marks [id number]:[id number] DOB: [birth year] DOA: 01/01/2018 PCP: Tamsen Roers, MD   LOS: 4 days   Brief Narrative / Interim history: 62 year old male with history of morbid obesity status post gastric bypass, hypertension, sleep apnea on CPAP who presented with perianal pain, found to have perirectal abscess with deep necrotizing fasciitis.  He was admitted to general surgery service and we were asked to consult for acute kidney injury and hypertension.  Assessment & Plan: Active Problems:   Necrotizing fasciitis ([place])   Acute kidney injury -Likely prerenal in the setting of sepsis, he received IV fluids antibiotics and kidney function slowly improving.  Creatinine on admission was 2.14, currently 1.2, repeat BMP is pending this morning -Continue to closely monitor given potentially nephrotoxic antibiotics on his regimen  Sepsis due to necrotizing fasciitis -Management per primary team, patient underwent I&D on 6/8, 6/10, 6/11 -Continue antibiotics with vancomycin, cefepime, metronidazole.  Already on Doxy suspect due to presence of tick on his scrotum.  Defer antibiotics to surgical team -Wound microbiology with moderate E. coli which is pansensitive, also with moderate GPC's and gram-positive rods, report is still pending today -Blood cultures have remained negative  Hypertension -Blood pressure creeping up, patient is complaining of fluid buildup, will resume lisinopril and HCTZ today  Sinus arrhythmia -Occasional PACs and PVCs, doubt clinically significant    DVT prophylaxis: Lovenox Code Status: Full code Family Communication: No family present at bedside Disposition Plan: TBD.  Patient is currently taking care of his 63 year old mother with Alzheimer's dementia, unlikely he will be able to care for his wound at home  Procedures:   I&D 6/8, 6/10, 6/11  Antimicrobials:  Vancomycin   Cefepime  Metronidazole  Clindamycin  Doxycycline     Subjective: -Patient has been complaining of loose bowel movements throughout the night after receiving enema as well as MiraLAX.  Objective: Vitals:   01/05/18 2256 01/05/18 2300 01/06/18 0355 01/06/18 0847  BP:  (!) 150/77 (!) 155/83 (!) 163/81  Pulse:    70  Resp:      Temp: 97.6 F (36.4 C)  97.7 F (36.5 C) 98.1 F (36.7 C)  TempSrc: Oral  Oral Oral  SpO2: 98%  99% 100%  Weight:      Height:        Intake/Output Summary (Last 24 hours) at 01/06/2018 1115 Last data filed at 01/06/2018 0325 Gross per 24 hour  Intake 964.67 ml  Output -  Net 964.67 ml   Filed Weights   01/02/18 0351 01/02/18 0634 01/05/18 1139  Weight: (!) 147 kg (324 lb 1.2 oz) (!) 147 kg (324 lb 1.2 oz) (!) 155.5 kg (342 lb 13 oz)    Examination:  Constitutional: No distress, Eyes: No scleral icterus Respiratory: No wheezing  Data Reviewed: I have independently reviewed following labs and imaging studies   CBC: Recent Labs  Lab 01/01/18 1835 01/02/18 0631 01/03/18 0154 01/04/18 0310  WBC 18.5* 24.7* 18.8* 22.3*  NEUTROABS 17.0*  --   --   --   HGB 14.0 15.0 11.7* 12.4*  HCT 40.6 42.9 33.9* 35.7*  MCV 85.7 85.0 85.6 85.0  PLT 356 247 342 967   Basic Metabolic Panel: Recent Labs  Lab 01/01/18 1835 01/02/18 0631 01/03/18 0154 01/04/18 0310  NA 137 137 139 138  K 2.9* 3.4* 3.1* 3.6  CL 98* 105 107 107  CO2 27 20* 23 24  GLUCOSE 97 100* 90 99  BUN 43* 47* 41* 29*  CREATININE 2.14*  1.90* 1.56* 1.28*  CALCIUM 8.5* 7.8* 7.9* 8.4*  MG  --   --  1.5* 1.6*   GFR: Estimated Creatinine Clearance: 92.7 mL/min (A) (by C-G formula based on SCr of 1.28 mg/dL (H)). Liver Function Tests: Recent Labs  Lab 01/01/18 1835  AST 25  ALT 20  ALKPHOS 64  BILITOT 0.9  PROT 6.2*  ALBUMIN 2.8*   No results for input(s): LIPASE, AMYLASE in the last 168 hours. No results for input(s): AMMONIA in the last 168 hours. Coagulation Profile: No results for input(s): INR, PROTIME in the last 168  hours. Cardiac Enzymes: No results for input(s): CKTOTAL, CKMB, CKMBINDEX, TROPONINI in the last 168 hours. BNP (last 3 results) No results for input(s): PROBNP in the last 8760 hours. HbA1C: No results for input(s): HGBA1C in the last 72 hours. CBG: No results for input(s): GLUCAP in the last 168 hours. Lipid Profile: No results for input(s): CHOL, HDL, LDLCALC, TRIG, CHOLHDL, LDLDIRECT in the last 72 hours. Thyroid Function Tests: No results for input(s): TSH, T4TOTAL, FREET4, T3FREE, THYROIDAB in the last 72 hours. Anemia Panel: No results for input(s): VITAMINB12, FOLATE, FERRITIN, TIBC, IRON, RETICCTPCT in the last 72 hours. Urine analysis:    Component Value Date/Time   COLORURINE AMBER (A) 01/02/2018 1123   APPEARANCEUR HAZY (A) 01/02/2018 1123   LABSPEC 1.019 01/02/2018 1123   PHURINE 5.0 01/02/2018 1123   GLUCOSEU NEGATIVE 01/02/2018 1123   HGBUR SMALL (A) 01/02/2018 1123   BILIRUBINUR NEGATIVE 01/02/2018 1123   KETONESUR NEGATIVE 01/02/2018 1123   PROTEINUR NEGATIVE 01/02/2018 1123   UROBILINOGEN 0.2 03/29/2017 1643   NITRITE NEGATIVE 01/02/2018 1123   LEUKOCYTESUR SMALL (A) 01/02/2018 1123   Sepsis Labs: Invalid input(s): PROCALCITONIN, LACTICIDVEN  Recent Results (from the past 240 hour(s))  Blood culture (routine x 2)     Status: None (Preliminary result)   Collection Time: 01/01/18  6:35 PM  Result Value Ref Range Status   Specimen Description BLOOD RIGHT ANTECUBITAL  Final   Special Requests   Final    BOTTLES DRAWN AEROBIC AND ANAEROBIC Blood Culture adequate volume   Culture   Final    NO GROWTH 4 DAYS Performed at [facility], [address] [place], [place] [address]    Report Status PENDING  Incomplete  Blood culture (routine x 2)     Status: None (Preliminary result)   Collection Time: 01/01/18  6:35 PM  Result Value Ref Range Status   Specimen Description BLOOD LEFT ANTECUBITAL  Final   Special Requests   Final    BOTTLES DRAWN AEROBIC  AND ANAEROBIC Blood Culture adequate volume   Culture   Final    NO GROWTH 4 DAYS Performed at [facility], [place] [address], [place], [place] [address]    Report Status PENDING  Incomplete  Fungus Culture With Stain     Status: None (Preliminary result)   Collection Time: 01/02/18  1:50 AM  Result Value Ref Range Status   Fungus Stain Final report  Final    Comment: (NOTE) Performed At: [facility] [place], [place] [id number] Rush Farmer MD [id number]    Fungus (Mycology) Culture PENDING  Incomplete   Fungal Source ABSCESS  Final    Comment: PERIRECTAL  Aerobic/Anaerobic Culture (surgical/deep wound)     Status: None (Preliminary result)   Collection Time: 01/02/18  1:50 AM  Result Value Ref Range Status   Specimen Description ABSCESS PERIRECTAL  Final   Special Requests  NONE  Final   Gram Stain   Final    RARE WBC PRESENT, PREDOMINANTLY PMN MODERATE GRAM POSITIVE COCCI IN PAIRS FEW GRAM POSITIVE RODS FEW GRAM NEGATIVE RODS    Culture   Final    MODERATE ESCHERICHIA COLI HOLDING FOR POSSIBLE ANAEROBE CULTURE REINCUBATED FOR BETTER GROWTH Performed at [facility], [place] [address] [employer] [employer], [place] [address]    Report Status PENDING  Incomplete   Organism ID, Bacteria ESCHERICHIA COLI  Final      Susceptibility   Escherichia coli - MIC*    AMPICILLIN 8 SENSITIVE Sensitive     CEFAZOLIN <=4 SENSITIVE Sensitive     CEFEPIME <=1 SENSITIVE Sensitive     CEFTAZIDIME <=1 SENSITIVE Sensitive     CEFTRIAXONE <=1 SENSITIVE Sensitive     CIPROFLOXACIN <=0.25 SENSITIVE Sensitive     GENTAMICIN <=1 SENSITIVE Sensitive     IMIPENEM <=0.25 SENSITIVE Sensitive     TRIMETH/SULFA <=20 SENSITIVE Sensitive     AMPICILLIN/SULBACTAM 4 SENSITIVE Sensitive     PIP/TAZO <=4 SENSITIVE Sensitive     Extended ESBL NEGATIVE Sensitive     * MODERATE ESCHERICHIA COLI  Fungus Stain     Status: None   Collection Time: 01/02/18  1:50 AM  Result Value Ref  Range Status   FUNGUS STAIN REFERT  Final    Comment: (NOTE) Please refer to the following specimen for additional lab results.      Reference [phone] for Fungus Stain results      that are included in test# [id number] Performed At: [facility] [place], [place] [id number] Rush Farmer MD [id number]    Fungal Source ABSCESS  Final    Comment: PERIRECTAL  Fungus Culture Result     Status: None   Collection Time: 01/02/18  1:50 AM  Result Value Ref Range Status   Result 1 Comment  Final    Comment: (NOTE) KOH/Calcofluor preparation:  no fungus observed. Performed At: [facility] [place], [place] [id number] Rush Farmer MD [id number]   Culture, Urine     Status: None   Collection Time: 01/02/18  9:06 PM  Result Value Ref Range Status   Specimen Description URINE, CLEAN CATCH  Final   Special Requests NONE  Final   Culture   Final    NO GROWTH Performed at [facility], [place] [address], [place], [place] [address]    Report Status 01/04/2018 FINAL  Final  Aerobic/Anaerobic Culture (surgical/deep wound)     Status: None (Preliminary result)   Collection Time: 01/04/18 12:13 PM  Result Value Ref Range Status   Specimen Description WOUND  Final   Special Requests BUTTOCKS  Final   Gram Stain   Final    RARE WBC PRESENT, PREDOMINANTLY PMN MODERATE GRAM VARIABLE ROD FEW GRAM POSITIVE COCCI IN PAIRS Performed at [facility], [place] [address], [place], [place] [address]    Culture FEW GRAM NEGATIVE RODS  Final   Report Status PENDING  Incomplete      Radiology Studies: No results found.   Scheduled Meds: . bethanechol  50 mg Oral TID  . docusate sodium  100 mg Oral BID  . doxycycline  100 mg Oral Q12H  . enoxaparin (LOVENOX) injection  40 mg Subcutaneous Q24H  . hydrochlorothiazide  25 mg Oral Daily  . lisinopril  10 mg Oral Daily  . polyethylene glycol  17 g Oral Daily  . tamsulosin  0.4 mg Oral  Daily   Continuous Infusions: . ceFEPime (MAXIPIME) IV 2 g (01/06/18 1010)  . lactated ringers 10 mL/hr at 01/05/18 1201  . lactated ringers 10 mL/hr at 01/05/18 1201  . metronidazole 500 mg (01/06/18 0959)    Marzetta Board, MD, PhD [facility] Pager [phone]  If 7PM-7AM, please contact night-coverage [email or web address] Password [facility] 01/06/2018, 11:15 AM

## 2018-01-07 ENCOUNTER — Inpatient Hospital Stay: Payer: Self-pay

## 2018-01-07 LAB — AEROBIC/ANAEROBIC CULTURE (SURGICAL/DEEP WOUND)

## 2018-01-07 LAB — CBC
HEMATOCRIT: 34.3 % — AB (ref 39.0–52.0)
HEMOGLOBIN: 11.8 g/dL — AB (ref 13.0–17.0)
MCH: 29.6 pg (ref 26.0–34.0)
MCHC: 34.4 g/dL (ref 30.0–36.0)
MCV: 86.2 fL (ref 78.0–100.0)
Platelets: 423 10*3/uL — ABNORMAL HIGH (ref 150–400)
RBC: 3.98 MIL/uL — ABNORMAL LOW (ref 4.22–5.81)
RDW: 15.9 % — ABNORMAL HIGH (ref 11.5–15.5)
WBC: 21.1 10*3/uL — ABNORMAL HIGH (ref 4.0–10.5)

## 2018-01-07 LAB — BASIC METABOLIC PANEL
ANION GAP: 9 (ref 5–15)
BUN: 24 mg/dL — ABNORMAL HIGH (ref 6–20)
CHLORIDE: 107 mmol/L (ref 101–111)
CO2: 24 mmol/L (ref 22–32)
Calcium: 8.4 mg/dL — ABNORMAL LOW (ref 8.9–10.3)
Creatinine, Ser: 1.11 mg/dL (ref 0.61–1.24)
GFR calc Af Amer: >60 mL/min (ref >60)
GFR calc non Af Amer: >60 mL/min (ref >60)
GLUCOSE: 86 mg/dL (ref 65–99)
Potassium: 3.4 mmol/L — ABNORMAL LOW (ref 3.5–5.1)
Sodium: 140 mmol/L (ref 135–145)

## 2018-01-07 LAB — AEROBIC/ANAEROBIC CULTURE W GRAM STAIN (SURGICAL/DEEP WOUND)

## 2018-01-07 MED ORDER — SODIUM CHLORIDE 0.9% FLUSH: 10.0000 mL | INTRAVENOUS | Status: DC | PRN

## 2018-01-07 MED ORDER — POTASSIUM CHLORIDE CRYS ER 20 MEQ PO TBCR
40.0000 meq | EXTENDED_RELEASE_TABLET | Freq: Once | ORAL | Status: AC
  Administered 2018-01-07: 40 meq via ORAL
  Filled 2018-01-07: qty 2

## 2018-01-07 MED ORDER — CHLORHEXIDINE GLUCONATE CLOTH 2 % EX PADS
6.0000 | MEDICATED_PAD | Freq: Every day | CUTANEOUS | Status: DC
  Administered 2018-01-07 – 2018-01-11 (×5): 6 via TOPICAL

## 2018-01-07 MED ORDER — SODIUM CHLORIDE 0.9% FLUSH
10.0000 mL | Freq: Two times a day (BID) | INTRAVENOUS | Status: DC
  Administered 2018-01-07: 20 mL
  Administered 2018-01-08 – 2018-01-10 (×4): 10 mL
  Administered 2018-01-10 – 2018-01-11 (×2): 20 mL
  Administered 2018-01-11: 10 mL

## 2018-01-07 NOTE — Progress Notes (Signed)
Patient ID: Brandon Marks, male   DOB: 26-Jul-1956, 62 y.o.   MRN: [id number]    2 Days Post-Op  Subjective: LATE ENTRY: Patient feels well today overall.  Tolerating his diet,  Urinating somewhat better, but still having difficulty getting his stream started.  Buttock pain is improving. He is having some soft BMs  Objective: Vital signs in last 24 hours: Temp:  [97.7 F (36.5 C)-98.5 F (36.9 C)] 98.5 F (36.9 C) (06/13 1958) Pulse Rate:  [66-72] 71 (06/13 1958) Resp:  [18] 18 (06/12 2345) BP: (103-168)/(54-80) 103/54 (06/13 1958) SpO2:  [95 %-100 %] 100 % (06/13 1958) Last BM Date: 01/05/18  Intake/Output from previous day: 06/12 0701 - 06/13 0700 In: 1005.8 [P.O.:720; I.V.:235.8; IV Piggyback:50] Out: 400 [Urine:400] Intake/Output this shift: No intake/output data recorded.  PE: Gen: NAD Heart: regular Lungs: CTAB Skin: buttock wound as noted below in picture prior to hydrotherapy today.    Lab Results:  Recent Labs    01/06/18 1107 01/07/18 0333  WBC 22.7* 21.1*  HGB 12.5* 11.8*  HCT 36.9* 34.3*  PLT 432* 423*   BMET Recent Labs    01/06/18 1107 01/07/18 0333  NA 140 140  K 3.6 3.4*  CL 109 107  CO2 24 24  GLUCOSE 95 86  BUN 30* 24*  CREATININE 1.17 1.11  CALCIUM 8.4* 8.4*   PT/INR No results for input(s): LABPROT, INR in the last 72 hours. CMP     Component Value Date/Time   NA 140 01/07/2018 0333   K 3.4 (L) 01/07/2018 0333   CL 107 01/07/2018 0333   CO2 24 01/07/2018 0333   GLUCOSE 86 01/07/2018 0333   BUN 24 (H) 01/07/2018 0333   CREATININE 1.11 01/07/2018 0333   CREATININE 0.85 08/30/2013 1104   CALCIUM 8.4 (L) 01/07/2018 0333   PROT 6.2 (L) 01/01/2018 1835   ALBUMIN 2.8 (L) 01/01/2018 1835   AST 25 01/01/2018 1835   ALT 20 01/01/2018 1835   ALKPHOS 64 01/01/2018 1835   BILITOT 0.9 01/01/2018 1835   GFRNONAA >60 01/07/2018 0333   GFRAA >60 01/07/2018 0333   Lipase     Component Value Date/Time   LIPASE 42 02/27/2012 0811        Studies/Results: [place] Ekg Site Rite  Result Date: 01/07/2018 If Site Rite image not attached, placement could not be confirmed due to current cardiac rhythm.   Anti-infectives: Anti-infectives (From admission, onward)   Start     Dose/Rate Route Frequency Ordered Stop   01/06/18 2300  piperacillin-tazobactam (ZOSYN) IVPB 3.375 g  Status:  Discontinued     3.375 g 12.5 mL/hr over 240 Minutes Intravenous Every 8 hours 01/06/18 1540 01/06/18 1936   01/06/18 2100  piperacillin-tazobactam (ZOSYN) IVPB 3.375 g     3.375 g 12.5 mL/hr over 240 Minutes Intravenous Every 8 hours 01/06/18 1936     01/06/18 1600  piperacillin-tazobactam (ZOSYN) IVPB 3.375 g  Status:  Discontinued     3.375 g 100 mL/hr over 30 Minutes Intravenous  Once 01/06/18 1545 01/06/18 1935   01/04/18 2200  ceFEPIme (MAXIPIME) 2 g in sodium chloride 0.9 % 100 mL IVPB  Status:  Discontinued    Note to Pharmacy:  Pharm to dose   2 g 200 mL/hr over 30 Minutes Intravenous Every 12 hours 01/04/18 1428 01/06/18 1537   01/04/18 1600  clindamycin (CLEOCIN) IVPB 600 mg  Status:  Discontinued     600 mg 100 mL/hr over 30 Minutes Intravenous Every 8 hours  01/04/18 1505 01/05/18 1145   01/04/18 1515  doxycycline (VIBRA-TABS) tablet 100 mg     100 mg Oral Every 12 hours 01/04/18 1505     01/04/18 1145  ceFEPIme (MAXIPIME) 2 g in sodium chloride 0.9 % 100 mL IVPB    Note to Pharmacy:  Pharm to dose   2 g 200 mL/hr over 30 Minutes Intravenous To Surgery 01/04/18 1131 01/05/18 1143   01/04/18 1145  metronidazole (FLAGYL) IVPB 500 mg  Status:  Discontinued     500 mg 100 mL/hr over 60 Minutes Intravenous To Surgery 01/04/18 1131 01/04/18 1412   01/03/18 1000  metroNIDAZOLE (FLAGYL) IVPB 500 mg  Status:  Discontinued     500 mg 100 mL/hr over 60 Minutes Intravenous Every 8 hours 01/03/18 0956 01/06/18 1537   01/02/18 1000  ceFEPIme (MAXIPIME) 2 g in sodium chloride 0.9 % 100 mL IVPB  Status:  Discontinued    Note to Pharmacy:   Pharm to dose   2 g 200 mL/hr over 30 Minutes Intravenous Every 12 hours 01/02/18 0340 01/04/18 1131   01/02/18 0600  vancomycin (VANCOCIN) IVPB 1000 mg/200 mL premix  Status:  Discontinued     1,000 mg 200 mL/hr over 60 Minutes Intravenous Every 12 hours 01/02/18 0409 01/05/18 1102   01/01/18 2330  ceFEPIme (MAXIPIME) 2 g in sodium chloride 0.9 % 100 mL IVPB     2 g 200 mL/hr over 30 Minutes Intravenous  Once 01/01/18 2321 01/02/18 0038   01/01/18 2315  vancomycin (VANCOCIN) 2,000 mg in sodium chloride 0.9 % 500 mL IVPB     2,000 mg 250 mL/hr over 120 Minutes Intravenous  Once 01/01/18 2313 01/05/18 1144   01/01/18 1945  cefTRIAXone (ROCEPHIN) 1 g in sodium chloride 0.9 % 100 mL IVPB     1 g 200 mL/hr over 30 Minutes Intravenous  Once 01/01/18 1932 01/01/18 2202   01/01/18 1945  metroNIDAZOLE (FLAGYL) IVPB 500 mg     500 mg 100 mL/hr over 60 Minutes Intravenous  Once 01/01/18 1932 01/01/18 2317       Assessment/Plan Sepsis secondary to Necrotizing fasciitis of perianal region, POD 5, 3,2, s/p debridement of necrotic tissue, Dr. Ninfa Linden, 01-02-18, Dr. Grandville Silos 01-04-18, 01-05-18  -WD dressing changes to wound along with hydrotherapy. -patient will likely require SNF for wound care at discharge.  Has no help at home and this wound is not in a place where he can change it himself. -CX growing e.coli.  On zosyn -doxy for tick  Sinus Arrhythmia  -stable  Recent UTI ARI 2.14 on admit, down to 1.11 today  HTN -had hypotension on admit, but now hypertensive again that sepsis has resolved/improved. -appreciate medicine's assistance with this.  Hyperkalemia/hypomagnesemia  -K is 3.4.  64mEq of Kdur.  Recheck BMET in am  Morbid obesity -lost 95# after gastric bypass 4 months ago at [place] - regular diet VTE -SCDs/Lovenox ID -zosyn,doxy    LOS: 5 days    Henreitta Cea , [facility] 01/07/2018, 9:09 PM Pager: [phone]

## 2018-01-07 NOTE — Progress Notes (Signed)
Peripherally Inserted Central Catheter/Midline Placement  The IV Nurse has discussed with the patient and/or persons authorized to consent for the patient, the purpose of this procedure and the potential benefits and risks involved with this procedure.  The benefits include less needle sticks, lab draws from the catheter, and the patient may be discharged home with the catheter. Risks include, but not limited to, infection, bleeding, blood clot (thrombus formation), and puncture of an artery; nerve damage and irregular heartbeat and possibility to perform a PICC exchange if needed/ordered by physician.  Alternatives to this procedure were also discussed.  Bard Power PICC patient education guide, fact sheet on infection prevention and patient information card has been provided to patient /or left at bedside.    PICC/Midline Placement Documentation  PICC Double Lumen 20/60/15 PICC Right Basilic 48 cm 2 cm (Active)  Indication for Insertion or Continuance of Line Poor Vasculature-patient has had multiple peripheral attempts or PIVs lasting less than 24 hours 01/07/2018  3:05 PM  Exposed Catheter (cm) 2 cm 01/07/2018  3:05 PM  Site Assessment Clean;Dry;Intact 01/07/2018  3:05 PM  Lumen #1 Status Flushed;Saline locked;Blood return noted 01/07/2018  3:05 PM  Lumen #2 Status Flushed;Saline locked;Blood return noted 01/07/2018  3:05 PM  Dressing Type Transparent 01/07/2018  3:05 PM  Dressing Status Clean;Dry;Intact;Antimicrobial disc in place 01/07/2018  3:05 PM  Dressing Change Due 01/14/18 01/07/2018  3:05 PM       Gordan Payment 01/07/2018, 3:06 PM

## 2018-01-07 NOTE — Progress Notes (Signed)
PT Cancellation Note  Patient Details Name: Brandon Marks MRN: [id number] DOB: 25-Jul-1956   Cancelled Treatment:    Reason Eval/Treat Not Completed: Patient declined, no reason specified   Burnard Bunting 01/07/2018, 5:32 PM

## 2018-01-07 NOTE — Progress Notes (Signed)
Physical Therapy Wound Treatment Patient Details  Name: Brandon Marks MRN: [id number] Date of Birth: Jan 05, 1956  Today's Date: 01/07/2018 Time: 1030-1120 Time Calculation (min): 50 min  Subjective  Subjective: Pt pleasant and agreeable to hydrotherapy Patient and Family Stated Goals: Heal wound Prior Treatments: I&D x3  Pain Score: Pt tolerated well with premedication.  Wound Assessment  Wound / Incision (Open or Dehisced) 01/07/18 Incision - Open Rectum (Active)  Wound Image   01/07/2018  3:51 PM  Dressing Type ABD;Gauze (Comment);Barrier Film (skin prep);Moist to dry 01/07/2018  3:51 PM  Dressing Changed Changed 01/07/2018  3:51 PM  Dressing Status Clean;Dry;Intact 01/07/2018  3:51 PM  Dressing Change Frequency Daily 01/07/2018  3:51 PM  Site / Wound Assessment Pink;Yellow;Brown 01/07/2018  3:51 PM  % Wound base Red or Granulating 25% 01/07/2018  3:51 PM  % Wound base Yellow/Fibrinous Exudate 50% 01/07/2018  3:51 PM  % Wound base Black/Eschar 25% 01/07/2018  3:51 PM  % Wound base Other/Granulation Tissue (Comment) 0% 01/07/2018  3:51 PM  Peri-wound Assessment Intact 01/07/2018  3:51 PM  Wound Length (cm) 7 cm 01/07/2018  3:51 PM  Wound Width (cm) 10.1 cm 01/07/2018  3:51 PM  Wound Depth (cm) 9 cm 01/07/2018  3:51 PM  Wound Volume (cm^3) 636.3 cm^3 01/07/2018  3:51 PM  Wound Surface Area (cm^2) 70.7 cm^2 01/07/2018  3:51 PM  Tunneling (cm) 6.5 cm at 9:00, 8.5 cm at 1:00 01/07/2018  3:51 PM  Margins Unattached edges (unapproximated) 01/07/2018  3:51 PM  Closure None 01/07/2018  3:51 PM  Drainage Amount Minimal 01/07/2018  3:51 PM  Drainage Description Purulent 01/07/2018  3:51 PM  Treatment Debridement (Selective);Hydrotherapy (Pulse lavage);Packing (Saline gauze) 01/07/2018  3:51 PM  Santyl applied to wound bed prior to applying dressing.   Hydrotherapy Pulsed lavage therapy - wound location: perirectal wound Pulsed Lavage with Suction (psi): (4 psi in the tunnels, 8 psi everywhere  else) Pulsed Lavage with Suction - Normal Saline Used: 1000 mL Selective Debridement Selective Debridement - Location: perirectal wound Selective Debridement - Tools Used: Forceps;Scalpel;Scissors Selective Debridement - Tissue Removed: yellow and brown necrotic tissue   Wound Assessment and Plan  Wound Therapy - Assess/Plan/Recommendations Wound Therapy - Clinical Statement: Pt presents s/p several I&D's of a peri-rectal wound. Pt will benefit from continued hydrotherapy for selective removal of unviable tissue, and to decrease bioburden and promote wound bed healing.  Wound Therapy - Functional Problem List: Decreased tolerance for OOB and positioning Factors Delaying/Impairing Wound Healing: Infection - systemic/local Hydrotherapy Plan: Debridement;Dressing change;Electrical stimulation;Patient/family education Wound Therapy - Frequency: 6X / week Wound Therapy - Follow Up Recommendations: Skilled nursing facility Wound Plan: See above  Wound Therapy Goals- Improve the function of patient's integumentary system by progressing the wound(s) through the phases of wound healing (inflammation - proliferation - remodeling) by: Decrease Necrotic Tissue to: 25% Decrease Necrotic Tissue - Progress: Goal set today Increase Granulation Tissue to: 75% Increase Granulation Tissue - Progress: Goal set today Goals/treatment plan/discharge plan were made with and agreed upon by patient/family: Yes Time For Goal Achievement: 7 days Wound Therapy - Potential for Goals: Good  Goals will be updated until maximal potential achieved or discharge criteria met.  Discharge criteria: when goals achieved, discharge from hospital, MD decision/surgical intervention, no progress towards goals, refusal/missing three consecutive treatments without notification or medical reason.  GP     Thelma Comp 01/07/2018, 4:08 PM  Rolinda Roan, PT, DPT Acute Rehabilitation Services Pager: [phone]

## 2018-01-07 NOTE — Progress Notes (Signed)
PROGRESS NOTE  Brandon Marks KPT:[id number] DOB: 30-Jan-1956 DOA: 01/01/2018 PCP: Tamsen Roers, MD   LOS: 5 days   Brief Narrative / Interim history: 62 year old male with history of morbid obesity status post gastric bypass, hypertension, sleep apnea on CPAP who presented with perianal pain, found to have perirectal abscess with deep necrotizing fasciitis.  He was admitted to general surgery service and we were asked to consult for acute kidney injury and hypertension.  Assessment & Plan: Active Problems:   Necrotizing fasciitis (HCC)  Acute kidney injury -Likely prerenal in the setting of sepsis, he received IV fluids antibiotics and kidney function slowly improving.  -Continue to closely monitor given potentially nephrotoxic antibiotics on his regimen -Creatinine has now normalized, 1.1 this morning  Sepsis due to necrotizing fasciitis -Management per primary team, patient underwent I&D on 6/8, 6/10, 6/11 -Defer antibiotics to surgical team -Wound microbiology with moderate E. coli which is pansensitive, also with gram-positive rods, gram-negative rods, actinomyces, final pending -Blood cultures have remained negative -Discussed with Dr. Lavone Neri today  Hypertension -Resume lisinopril and HCTZ on 6/12, blood pressure acceptable but still on the high side, I assume pain plays a role -Continue current regimen, renal function is stable  Sinus arrhythmia -Occasional PACs and PVCs, doubt clinically significant    DVT prophylaxis: Lovenox Code Status: Full code Family Communication: No family present at bedside Disposition Plan: Will likely need SNF, discussed with social worker today  Procedures:   I&D 6/8, 6/10, 6/11  Antimicrobials:  Vancomycin   Cefepime  Metronidazole  Clindamycin  Doxycycline    Subjective: -Patient has been complaining of loose bowel movements throughout the night after receiving enema as well as MiraLAX.  Objective: Vitals:   01/06/18  2345 01/07/18 0012 01/07/18 0932 01/07/18 1141  BP:  (!) 153/76 (!) 168/80 (!) 154/77  Pulse: 72 68 66 70  Resp: 18     Temp:  98 F (36.7 C) 98.4 F (36.9 C) 97.7 F (36.5 C)  TempSrc:  Oral Oral Oral  SpO2: 98% 96% 96% 95%  Weight:      Height:        Intake/Output Summary (Last 24 hours) at 01/07/2018 1247 Last data filed at 01/07/2018 0300 Gross per 24 hour  Intake 765.83 ml  Output 400 ml  Net 365.83 ml   Filed Weights   01/02/18 0351 01/02/18 0634 01/05/18 1139  Weight: (!) 147 kg (324 lb 1.2 oz) (!) 147 kg (324 lb 1.2 oz) (!) 155.5 kg (342 lb 13 oz)    Examination:  Constitutional: NAD, calm, comfortable Eyes: PERRL, lids and conjunctivae normal Respiratory: clear to auscultation bilaterally, no wheezing, no crackles. Normal respiratory effort.  Cardiovascular: Regular rate and rhythm, no murmurs / rubs / gallops.  Neurologic: non focal   Data Reviewed: I have independently reviewed following labs and imaging studies   CBC: Recent Labs  Lab 01/01/18 1835 01/02/18 0631 01/03/18 0154 01/04/18 0310 01/06/18 1107 01/07/18 0333  WBC 18.5* 24.7* 18.8* 22.3* 22.7* 21.1*  NEUTROABS 17.0*  --   --   --   --   --   HGB 14.0 15.0 11.7* 12.4* 12.5* 11.8*  HCT 40.6 42.9 33.9* 35.7* 36.9* 34.3*  MCV 85.7 85.0 85.6 85.0 86.6 86.2  PLT 356 247 342 372 432* 170*   Basic Metabolic Panel: Recent Labs  Lab 01/02/18 0631 01/03/18 0154 01/04/18 0310 01/06/18 1107 01/07/18 0333  NA 137 139 138 140 140  K 3.4* 3.1* 3.6 3.6 3.4*  CL 105  107 107 109 107  CO2 20* 23 24 24 24   GLUCOSE 100* 90 99 95 86  BUN 47* 41* 29* 30* 24*  CREATININE 1.90* 1.56* 1.28* 1.17 1.11  CALCIUM 7.8* 7.9* 8.4* 8.4* 8.4*  MG  --  1.5* 1.6*  --   --    GFR: Estimated Creatinine Clearance: 106.8 mL/min (by C-G formula based on SCr of 1.11 mg/dL). Liver Function Tests: Recent Labs  Lab 01/01/18 1835  AST 25  ALT 20  ALKPHOS 64  BILITOT 0.9  PROT 6.2*  ALBUMIN 2.8*   No results for  input(s): LIPASE, AMYLASE in the last 168 hours. No results for input(s): AMMONIA in the last 168 hours. Coagulation Profile: No results for input(s): INR, PROTIME in the last 168 hours. Cardiac Enzymes: No results for input(s): CKTOTAL, CKMB, CKMBINDEX, TROPONINI in the last 168 hours. BNP (last 3 results) No results for input(s): PROBNP in the last 8760 hours. HbA1C: No results for input(s): HGBA1C in the last 72 hours. CBG: No results for input(s): GLUCAP in the last 168 hours. Lipid Profile: No results for input(s): CHOL, HDL, LDLCALC, TRIG, CHOLHDL, LDLDIRECT in the last 72 hours. Thyroid Function Tests: No results for input(s): TSH, T4TOTAL, FREET4, T3FREE, THYROIDAB in the last 72 hours. Anemia Panel: No results for input(s): VITAMINB12, FOLATE, FERRITIN, TIBC, IRON, RETICCTPCT in the last 72 hours. Urine analysis:    Component Value Date/Time   COLORURINE AMBER (A) 01/02/2018 1123   APPEARANCEUR HAZY (A) 01/02/2018 1123   LABSPEC 1.019 01/02/2018 1123   PHURINE 5.0 01/02/2018 1123   GLUCOSEU NEGATIVE 01/02/2018 1123   HGBUR SMALL (A) 01/02/2018 1123   BILIRUBINUR NEGATIVE 01/02/2018 1123   KETONESUR NEGATIVE 01/02/2018 1123   PROTEINUR NEGATIVE 01/02/2018 1123   UROBILINOGEN 0.2 03/29/2017 1643   NITRITE NEGATIVE 01/02/2018 1123   LEUKOCYTESUR SMALL (A) 01/02/2018 1123   Sepsis Labs: Invalid input(s): PROCALCITONIN, LACTICIDVEN  Recent Results (from the past 240 hour(s))  Blood culture (routine x 2)     Status: None   Collection Time: 01/01/18  6:35 PM  Result Value Ref Range Status   Specimen Description BLOOD RIGHT ANTECUBITAL  Final   Special Requests   Final    BOTTLES DRAWN AEROBIC AND ANAEROBIC Blood Culture adequate volume   Culture   Final    NO GROWTH 5 DAYS Performed at [facility], [address] [place], [place] [address]    Report Status 01/06/2018 FINAL  Final  Blood culture (routine x 2)     Status: None   Collection Time: 01/01/18   6:35 PM  Result Value Ref Range Status   Specimen Description BLOOD LEFT ANTECUBITAL  Final   Special Requests   Final    BOTTLES DRAWN AEROBIC AND ANAEROBIC Blood Culture adequate volume   Culture   Final    NO GROWTH 5 DAYS Performed at [facility], [place] [address] [place], [place] [address]    Report Status 01/06/2018 FINAL  Final  Fungus Culture With Stain     Status: None (Preliminary result)   Collection Time: 01/02/18  1:50 AM  Result Value Ref Range Status   Fungus Stain Final report  Final    Comment: (NOTE) Performed At: [facility] [place], [place] [id number] Rush Farmer MD XL:[id number]    Fungus (Mycology) Culture PENDING  Incomplete   Fungal Source ABSCESS  Final    Comment: PERIRECTAL  Aerobic/Anaerobic Culture (surgical/deep wound)     Status:  None (Preliminary result)   Collection Time: 01/02/18  1:50 AM  Result Value Ref Range Status   Specimen Description ABSCESS PERIRECTAL  Final   Special Requests NONE  Final   Gram Stain   Final    RARE WBC PRESENT, PREDOMINANTLY PMN MODERATE GRAM POSITIVE COCCI IN PAIRS FEW GRAM POSITIVE RODS FEW GRAM NEGATIVE RODS Performed at [facility], [place] [address], [place], [place] [address]    Culture   Final    MODERATE ESCHERICHIA COLI MODERATE ACTINOMYCES SPECIES Standardized susceptibility testing for this organism is not available. MIXED ANAEROBIC FLORA PRESENT.  CALL LAB IF FURTHER IID REQUIRED.    Report Status PENDING  Incomplete   Organism ID, Bacteria ESCHERICHIA COLI  Final      Susceptibility   Escherichia coli - MIC*    AMPICILLIN 8 SENSITIVE Sensitive     CEFAZOLIN <=4 SENSITIVE Sensitive     CEFEPIME <=1 SENSITIVE Sensitive     CEFTAZIDIME <=1 SENSITIVE Sensitive     CEFTRIAXONE <=1 SENSITIVE Sensitive     CIPROFLOXACIN <=0.25 SENSITIVE Sensitive     GENTAMICIN <=1 SENSITIVE Sensitive     IMIPENEM <=0.25 SENSITIVE Sensitive     TRIMETH/SULFA <=20 SENSITIVE  Sensitive     AMPICILLIN/SULBACTAM 4 SENSITIVE Sensitive     PIP/TAZO <=4 SENSITIVE Sensitive     Extended ESBL NEGATIVE Sensitive     * MODERATE ESCHERICHIA COLI  Fungus Stain     Status: None   Collection Time: 01/02/18  1:50 AM  Result Value Ref Range Status   FUNGUS STAIN REFERT  Final    Comment: (NOTE) Please refer to the following specimen for additional lab results.      Reference [phone] for Fungus Stain results      that are included in test# [id number] Performed At: [facility] [place], [place] [id number] Rush Farmer MD DV:[id number]    Fungal Source ABSCESS  Final    Comment: PERIRECTAL  Fungus Culture Result     Status: None   Collection Time: 01/02/18  1:50 AM  Result Value Ref Range Status   Result 1 Comment  Final    Comment: (NOTE) KOH/Calcofluor preparation:  no fungus observed. Performed At: [facility] [place], [place] [id number] Rush Farmer MD EV:[id number]   Culture, Urine     Status: None   Collection Time: 01/02/18  9:06 PM  Result Value Ref Range Status   Specimen Description URINE, CLEAN CATCH  Final   Special Requests NONE  Final   Culture   Final    NO GROWTH Performed at [facility], [place] [address] [place], [place] [address]    Report Status 01/04/2018 FINAL  Final  Aerobic/Anaerobic Culture (surgical/deep wound)     Status: None (Preliminary result)   Collection Time: 01/04/18 12:13 PM  Result Value Ref Range Status   Specimen Description WOUND  Final   Special Requests BUTTOCKS  Final   Gram Stain   Final    RARE WBC PRESENT, PREDOMINANTLY PMN MODERATE GRAM VARIABLE ROD FEW GRAM POSITIVE COCCI IN PAIRS Performed at [facility], [place] [address], [place], [place] [address]    Culture   Final    FEW GRAM NEGATIVE RODS IDENTIFICATION AND SUSCEPTIBILITIES TO FOLLOW NO ANAEROBES ISOLATED; CULTURE IN PROGRESS FOR 5 DAYS    Report Status PENDING  Incomplete       Radiology Studies: No results found.   Scheduled Meds: . bethanechol  50 mg Oral TID  . docusate sodium  100 mg Oral BID  . doxycycline  100 mg Oral Q12H  . enoxaparin (LOVENOX) injection  40 mg Subcutaneous Q24H  . hydrochlorothiazide  25 mg Oral Daily  . lisinopril  10 mg Oral Daily  . polyethylene glycol  17 g Oral Daily  . tamsulosin  0.4 mg Oral Daily   Continuous Infusions: . lactated ringers 10 mL/hr at 01/05/18 1201  . lactated ringers 10 mL/hr at 01/05/18 1201  . piperacillin-tazobactam (ZOSYN)  IV Stopped (01/07/18 1610)    Marzetta Board, MD, PhD [facility] Pager [phone]  If 7PM-7AM, please contact night-coverage [email or web address] Password [facility] 01/07/2018, 12:47 PM

## 2018-01-07 NOTE — Progress Notes (Signed)
OT Cancellation Note  Patient Details Name: Brandon Marks MRN: [id number] DOB: 1955/11/10   Cancelled Treatment:    Reason Eval/Treat Not Completed: Patient at procedure or test/ unavailable(hydrotherapy). OT will attempt as schedule allows.  Robertson 01/07/2018, 3:19 PM  Hulda Humphrey OTR/L [phone]

## 2018-01-08 ENCOUNTER — Inpatient Hospital Stay (HOSPITAL_COMMUNITY): Payer: Medicare Other

## 2018-01-08 LAB — BASIC METABOLIC PANEL
Anion gap: 6 (ref 5–15)
BUN: 19 mg/dL (ref 6–20)
CALCIUM: 8.2 mg/dL — AB (ref 8.9–10.3)
CO2: 26 mmol/L (ref 22–32)
Chloride: 107 mmol/L (ref 101–111)
Creatinine, Ser: 1.04 mg/dL (ref 0.61–1.24)
GFR calc Af Amer: >60 mL/min (ref >60)
GLUCOSE: 83 mg/dL (ref 65–99)
Potassium: 3.2 mmol/L — ABNORMAL LOW (ref 3.5–5.1)
Sodium: 139 mmol/L (ref 135–145)

## 2018-01-08 LAB — CBC
HEMATOCRIT: 33.2 % — AB (ref 39.0–52.0)
Hemoglobin: 11.4 g/dL — ABNORMAL LOW (ref 13.0–17.0)
MCH: 29.4 pg (ref 26.0–34.0)
MCHC: 34.3 g/dL (ref 30.0–36.0)
MCV: 85.6 fL (ref 78.0–100.0)
PLATELETS: 417 10*3/uL — AB (ref 150–400)
RBC: 3.88 MIL/uL — AB (ref 4.22–5.81)
RDW: 16.1 % — AB (ref 11.5–15.5)
WBC: 21.5 10*3/uL — AB (ref 4.0–10.5)

## 2018-01-08 MED ORDER — POTASSIUM CHLORIDE CRYS ER 20 MEQ PO TBCR
40.0000 meq | EXTENDED_RELEASE_TABLET | Freq: Two times a day (BID) | ORAL | Status: DC
  Administered 2018-01-08 – 2018-01-12 (×9): 40 meq via ORAL
  Filled 2018-01-08 (×9): qty 2

## 2018-01-08 MED ORDER — FUROSEMIDE 10 MG/ML IJ SOLN
INTRAMUSCULAR | Status: AC
  Filled 2018-01-08: qty 2

## 2018-01-08 MED ORDER — FUROSEMIDE 10 MG/ML IJ SOLN
20.0000 mg | Freq: Once | INTRAMUSCULAR | Status: AC
  Administered 2018-01-08: 20 mg via INTRAVENOUS
  Filled 2018-01-08 (×3): qty 2

## 2018-01-08 NOTE — Progress Notes (Signed)
Notified Dr. Cruzita Lederer patient had had multiple watery, yellow, and green bowel movements today, C. Diff was ordered.

## 2018-01-08 NOTE — Progress Notes (Signed)
PROGRESS NOTE  NATE COMMON QAS:[id number] DOB: Feb 18, 1956 DOA: 01/01/2018 PCP: Tamsen Roers, MD   LOS: 6 days   Brief Narrative / Interim history: 62 year old male with history of morbid obesity status post gastric bypass, hypertension, sleep apnea on CPAP who presented with perianal pain, found to have perirectal abscess with deep necrotizing fasciitis.  He was admitted to general surgery service and we were asked to consult for acute kidney injury and hypertension.  Assessment & Plan: Active Problems:   Necrotizing fasciitis (HCC)  Acute kidney injury -Likely prerenal in the setting of sepsis, he received IV fluids antibiotics and kidney function slowly improving.  -Continue to closely monitor given potentially nephrotoxic antibiotics on his regimen -Creatinine has now normalized -Given significant fluid overload, will give IV Lasix today, monitor renal function  Sepsis due to necrotizing fasciitis -Management per primary team, patient underwent I&D on 6/8, 6/10, 6/11 -Defer antibiotics to surgical team -Wound microbiology with moderate E. coli which is pansensitive, also with gram-positive rods, gram-negative rods, actinomyces, final pending -Blood cultures have remained negative -Continue hydrotherapy  Hypertension -Resume lisinopril and HCTZ on 6/12, blood pressure acceptable but still on the high side, I assume pain plays a role -Stopped HCTZ today and added Lasix, blood pressure is overall stable  Sinus arrhythmia -Occasional PACs and PVCs, doubt clinically significant  Fluid overload -Appears to be up 14 L, likely in the setting of sepsis and fluid resuscitation, will dose Lasix IV x1 today and reevaluate fluid status in the morning.   DVT prophylaxis: Lovenox Code Status: Full code Family Communication: No family present at bedside Disposition Plan: Will likely need SNF, discussed with social worker today  Procedures:   I&D 6/8, 6/10,  6/11  Antimicrobials:  Vancomycin   Cefepime  Metronidazole  Clindamycin  Doxycycline    Subjective: -Patient has been complaining of loose bowel movements throughout the night after receiving enema as well as MiraLAX.  Objective: Vitals:   01/07/18 1958 01/07/18 2339 01/08/18 0400 01/08/18 0850  BP: (!) 103/54 (!) 153/72 (!) 121/54 (!) 164/73  Pulse: 71 71 71 68  Resp:   18   Temp: 98.5 F (36.9 C) 98.4 F (36.9 C) 99.2 F (37.3 C) 98.2 F (36.8 C)  TempSrc: Oral Oral Oral Oral  SpO2: 100% 99% 95% 98%  Weight:      Height:       No intake or output data in the 24 hours ending 01/08/18 1219 Filed Weights   01/02/18 0351 01/02/18 0634 01/05/18 1139  Weight: (!) 147 kg (324 lb 1.2 oz) (!) 147 kg (324 lb 1.2 oz) (!) 155.5 kg (342 lb 13 oz)    Examination:  Constitutional: NAD, calm, comfortable Eyes:, lids and conjunctivae normal.  Neck: normal, supple Respiratory: clear to auscultation bilaterally, no wheezing, no crackles. Normal respiratory effort.  Cardiovascular: Regular rate and rhythm, no murmurs / rubs / gallops. 1+ LE edema. 2+ pedal pulses.  Abdomen: no tenderness. Bowel sounds positive.  Skin: no rashes, lesions, ulcers. No induration Neurologic: non focal    Data Reviewed: I have independently reviewed following labs and imaging studies   CBC: Recent Labs  Lab 01/01/18 1835  01/03/18 0154 01/04/18 0310 01/06/18 1107 01/07/18 0333 01/08/18 0500  WBC 18.5*   < > 18.8* 22.3* 22.7* 21.1* 21.5*  NEUTROABS 17.0*  --   --   --   --   --   --   HGB 14.0   < > 11.7* 12.4* 12.5* 11.8* 11.4*  HCT 40.6   < > 33.9* 35.7* 36.9* 34.3* 33.2*  MCV 85.7   < > 85.6 85.0 86.6 86.2 85.6  PLT 356   < > 342 372 432* 423* 417*   < > = values in this interval not displayed.   Basic Metabolic Panel: Recent Labs  Lab 01/03/18 0154 01/04/18 0310 01/06/18 1107 01/07/18 0333 01/08/18 0500  NA 139 138 140 140 139  K 3.1* 3.6 3.6 3.4* 3.2*  CL 107 107 109 107  107  CO2 23 24 24 24 26   GLUCOSE 90 99 95 86 83  BUN 41* 29* 30* 24* 19  CREATININE 1.56* 1.28* 1.17 1.11 1.04  CALCIUM 7.9* 8.4* 8.4* 8.4* 8.2*  MG 1.5* 1.6*  --   --   --    GFR: Estimated Creatinine Clearance: 114 mL/min (by C-G formula based on SCr of 1.04 mg/dL). Liver Function Tests: Recent Labs  Lab 01/01/18 1835  AST 25  ALT 20  ALKPHOS 64  BILITOT 0.9  PROT 6.2*  ALBUMIN 2.8*   No results for input(s): LIPASE, AMYLASE in the last 168 hours. No results for input(s): AMMONIA in the last 168 hours. Coagulation Profile: No results for input(s): INR, PROTIME in the last 168 hours. Cardiac Enzymes: No results for input(s): CKTOTAL, CKMB, CKMBINDEX, TROPONINI in the last 168 hours. BNP (last 3 results) No results for input(s): PROBNP in the last 8760 hours. HbA1C: No results for input(s): HGBA1C in the last 72 hours. CBG: No results for input(s): GLUCAP in the last 168 hours. Lipid Profile: No results for input(s): CHOL, HDL, LDLCALC, TRIG, CHOLHDL, LDLDIRECT in the last 72 hours. Thyroid Function Tests: No results for input(s): TSH, T4TOTAL, FREET4, T3FREE, THYROIDAB in the last 72 hours. Anemia Panel: No results for input(s): VITAMINB12, FOLATE, FERRITIN, TIBC, IRON, RETICCTPCT in the last 72 hours. Urine analysis:    Component Value Date/Time   COLORURINE AMBER (A) 01/02/2018 1123   APPEARANCEUR HAZY (A) 01/02/2018 1123   LABSPEC 1.019 01/02/2018 1123   PHURINE 5.0 01/02/2018 1123   GLUCOSEU NEGATIVE 01/02/2018 1123   HGBUR SMALL (A) 01/02/2018 1123   BILIRUBINUR NEGATIVE 01/02/2018 1123   KETONESUR NEGATIVE 01/02/2018 1123   PROTEINUR NEGATIVE 01/02/2018 1123   UROBILINOGEN 0.2 03/29/2017 1643   NITRITE NEGATIVE 01/02/2018 1123   LEUKOCYTESUR SMALL (A) 01/02/2018 1123   Sepsis Labs: Invalid input(s): PROCALCITONIN, LACTICIDVEN  Recent Results (from the past 240 hour(s))  Blood culture (routine x 2)     Status: None   Collection Time: 01/01/18  6:35 PM   Result Value Ref Range Status   Specimen Description BLOOD RIGHT ANTECUBITAL  Final   Special Requests   Final    BOTTLES DRAWN AEROBIC AND ANAEROBIC Blood Culture adequate volume   Culture   Final    NO GROWTH 5 DAYS Performed at [facility], [address], [place], [place] [address]    Report Status 01/06/2018 FINAL  Final  Blood culture (routine x 2)     Status: None   Collection Time: 01/01/18  6:35 PM  Result Value Ref Range Status   Specimen Description BLOOD LEFT ANTECUBITAL  Final   Special Requests   Final    BOTTLES DRAWN AEROBIC AND ANAEROBIC Blood Culture adequate volume   Culture   Final    NO GROWTH 5 DAYS Performed at [facility], [place] [address] [place], [place] [address]    Report Status 01/06/2018 FINAL  Final  Fungus Culture With  Stain     Status: None (Preliminary result)   Collection Time: 01/02/18  1:50 AM  Result Value Ref Range Status   Fungus Stain Final report  Final    Comment: (NOTE) Performed At: [facility] [place], [place] [id number] Rush Farmer MD TK:[id number]    Fungus (Mycology) Culture PENDING  Incomplete   Fungal Source ABSCESS  Final    Comment: PERIRECTAL  Aerobic/Anaerobic Culture (surgical/deep wound)     Status: None   Collection Time: 01/02/18  1:50 AM  Result Value Ref Range Status   Specimen Description ABSCESS PERIRECTAL  Final   Special Requests NONE  Final   Gram Stain   Final    RARE WBC PRESENT, PREDOMINANTLY PMN MODERATE GRAM POSITIVE COCCI IN PAIRS FEW GRAM POSITIVE RODS FEW GRAM NEGATIVE RODS Performed at [facility], [place] [address], [place], [place] [address]    Culture   Final    MODERATE ESCHERICHIA COLI MODERATE ACTINOMYCES SPECIES Standardized susceptibility testing for this organism is not available. MIXED ANAEROBIC FLORA PRESENT.  CALL LAB IF FURTHER IID REQUIRED.    Report Status 01/07/2018 FINAL  Final   Organism ID, Bacteria ESCHERICHIA COLI  Final       Susceptibility   Escherichia coli - MIC*    AMPICILLIN 8 SENSITIVE Sensitive     CEFAZOLIN <=4 SENSITIVE Sensitive     CEFEPIME <=1 SENSITIVE Sensitive     CEFTAZIDIME <=1 SENSITIVE Sensitive     CEFTRIAXONE <=1 SENSITIVE Sensitive     CIPROFLOXACIN <=0.25 SENSITIVE Sensitive     GENTAMICIN <=1 SENSITIVE Sensitive     IMIPENEM <=0.25 SENSITIVE Sensitive     TRIMETH/SULFA <=20 SENSITIVE Sensitive     AMPICILLIN/SULBACTAM 4 SENSITIVE Sensitive     PIP/TAZO <=4 SENSITIVE Sensitive     Extended ESBL NEGATIVE Sensitive     * MODERATE ESCHERICHIA COLI  Fungus Stain     Status: None   Collection Time: 01/02/18  1:50 AM  Result Value Ref Range Status   FUNGUS STAIN REFERT  Final    Comment: (NOTE) Please refer to the following specimen for additional lab results.      Reference [phone] for Fungus Stain results      that are included in test# [id number] Performed At: [facility] [place], [place] [id number] Rush Farmer MD [id number]    Fungal Source ABSCESS  Final    Comment: PERIRECTAL  Fungus Culture Result     Status: None   Collection Time: 01/02/18  1:50 AM  Result Value Ref Range Status   Result 1 Comment  Final    Comment: (NOTE) KOH/Calcofluor preparation:  no fungus observed. Performed At: [facility] [place], [place] [id number] Rush Farmer MD HW:[id number]   Culture, Urine     Status: None   Collection Time: 01/02/18  9:06 PM  Result Value Ref Range Status   Specimen Description URINE, CLEAN CATCH  Final   Special Requests NONE  Final   Culture   Final    NO GROWTH Performed at [facility], [place] [address] [place], [place] [address]    Report Status 01/04/2018 FINAL  Final  Aerobic/Anaerobic Culture (surgical/deep wound)     Status: None (Preliminary result)   Collection Time: 01/04/18 12:13 PM  Result Value Ref Range Status   Specimen Description WOUND  Final   Special Requests BUTTOCKS  Final    Gram Stain   Final  RARE WBC PRESENT, PREDOMINANTLY PMN MODERATE GRAM VARIABLE ROD FEW GRAM POSITIVE COCCI IN PAIRS Performed at [facility], [place] [address], [place], [place] [address]    Culture   Final    FEW GRAM NEGATIVE RODS IDENTIFICATION AND SUSCEPTIBILITIES TO FOLLOW NO ANAEROBES ISOLATED; CULTURE IN PROGRESS FOR 5 DAYS    Report Status PENDING  Incomplete      Radiology Studies: Dg Chest Port 1 View  Result Date: 01/08/2018 CLINICAL DATA:  Shortness of breath EXAM: PORTABLE CHEST 1 VIEW COMPARISON:  February 27, 2012 FINDINGS: Central catheter tip is in the superior vena cava. No pneumothorax. No edema or consolidation. Heart is mildly enlarged with pulmonary vascularity normal. No adenopathy. No bone lesions. IMPRESSION: Central catheter tip in superior vena cava. No pneumothorax. No edema or consolidation. Stable cardiac prominence. Electronically Signed   By: Lowella Grip III M.D.   On: 01/08/2018 11:56   [place] Ekg Site Rite  Result Date: 01/07/2018 If Site Rite image not attached, placement could not be confirmed due to current cardiac rhythm.    Scheduled Meds: . bethanechol  50 mg Oral TID  . Chlorhexidine Gluconate Cloth  6 each Topical Q0600  . docusate sodium  100 mg Oral BID  . doxycycline  100 mg Oral Q12H  . enoxaparin (LOVENOX) injection  40 mg Subcutaneous Q24H  . furosemide  20 mg Intravenous Once  . lisinopril  10 mg Oral Daily  . polyethylene glycol  17 g Oral Daily  . potassium chloride  40 mEq Oral BID  . sodium chloride flush  10-40 mL Intracatheter Q12H  . tamsulosin  0.4 mg Oral Daily   Continuous Infusions: . lactated ringers 10 mL/hr at 01/05/18 1201  . lactated ringers 10 mL/hr at 01/05/18 1201  . piperacillin-tazobactam (ZOSYN)  IV Stopped (01/08/18 3545)    Marzetta Board, MD, PhD [facility] Pager [phone]  If 7PM-7AM, please contact night-coverage [email or web address] Password [facility] 01/08/2018, 12:19 PM

## 2018-01-08 NOTE — Progress Notes (Signed)
OT Cancellation Note  Patient Details Name: Brandon Marks MRN: [id number] DOB: 16-Jul-1956   Cancelled Treatment:    Reason Eval/Treat Not Completed: Patient declined, no reason specified(reports just getting comfortable about x4 dressing changes) Pt reports uncontrollable void of bowel with incontinence that is extremely embarrassing. Pt reports "I just got comfortable and I hate to say it but I have been at it all day. " Pt requesting pastoral care visit and a notary if possible to have assistance with advance directives.  Vonita Moss   OTR/L Pager: [phone] Office: [phone] .  01/08/2018, 1:53 PM

## 2018-01-08 NOTE — Progress Notes (Signed)
Physical Therapy Treatment Patient Details Name: Brandon Marks MRN: [id number] DOB: [birth year] Today's Date: 01/08/2018    History of Present Illness pt is a 62 y/o male with 1 week h/o perianal pain.  CT showing a necrotizing perirectal abscess.  Pt s/p I and D x2.  PMH:  HTN, gastic bypass surgery.    PT Comments    Pt in pain due to butock wound, initially declining PT. Pt then willing to ambulate in room to toilet. Pt demonstrates improved mobility as control of RW during gait improved and level of A decreased. Pt would benefit from PT sessions consisting of increased ambulation distance and attempting stairs.  Follow Up Recommendations  Home health PT;Other (comment)(vs SNF for further wound care)     Equipment Recommendations  Rolling walker with 5" wheels    Recommendations for Other Services       Precautions / Restrictions Precautions Precautions: Fall Restrictions Weight Bearing Restrictions: No    Mobility  Bed Mobility Overal bed mobility: Modified Independent Bed Mobility: Sidelying to Sit   Sidelying to sit: Modified independent (Device/Increase time);HOB elevated       General bed mobility comments: Pt performed sidelying>sit mod I with HOB elevated and use of bed rail. Increased time required  Transfers Overall transfer level: Needs assistance Equipment used: Rolling walker (2 wheeled) Transfers: Sit to/from Stand Sit to Stand: Min guard         General transfer comment: Pt pulls up on RW to stand. Min guard for safety  Ambulation/Gait Ambulation/Gait assistance: Min guard Gait Distance (Feet): 15 Feet Assistive device: Rolling walker (2 wheeled) Gait Pattern/deviations: Step-through pattern;Decreased stride length;Trunk flexed Gait velocity: Decreased   General Gait Details: Pt ambulated 15 ft from bed to toilet min guard. Pt demonstrates increased control of RW and consistency of gait speed compared to previous PT session   Stairs              Wheelchair Mobility    Modified Rankin (Stroke Patients Only)       Balance Overall balance assessment: Needs assistance Sitting-balance support: No upper extremity supported;Feet supported Sitting balance-Leahy Scale: Fair Sitting balance - Comments: able to sit without support; Not formally challenged   Standing balance support: Bilateral upper extremity supported Standing balance-Leahy Scale: Fair Standing balance comment: Pt can stand statically without support                            Cognition Arousal/Alertness: Awake/alert Behavior During Therapy: WFL for tasks assessed/performed Overall Cognitive Status: Within Functional Limits for tasks assessed                                        Exercises      General Comments        Pertinent Vitals/Pain Pain Assessment: 0-10 Pain Score: 4  Pain Location: Perirectal Pain Descriptors / Indicators: Discomfort Pain Intervention(s): Limited activity within patient's tolerance;Monitored during session    Home Living                      Prior Function            PT Goals (current goals can now be found in the care plan section) Progress towards PT goals: Progressing toward goals    Frequency    Min 3X/week  PT Plan Current plan remains appropriate    Co-evaluation              AM-PAC PT "6 Clicks" Daily Activity  Outcome Measure  Difficulty turning over in bed (including adjusting bedclothes, sheets and blankets)?: A Little Difficulty moving from lying on back to sitting on the side of the bed? : A Lot Difficulty sitting down on and standing up from a chair with arms (e.g., wheelchair, bedside commode, etc,.)?: A Little Help needed moving to and from a bed to chair (including a wheelchair)?: A Little Help needed walking in hospital room?: A Little Help needed climbing 3-5 steps with a railing? : A Little 6 Click Score: 17    End of Session    Activity Tolerance: Patient tolerated treatment well;Other (comment)(Bowel movement) Patient left: with nursing/sitter in room;Other (comment);with family/visitor present(On commode with NT present)   PT Visit Diagnosis: Other abnormalities of gait and mobility (R26.89);Pain Pain - part of body: (Buttock)     Time: 6712-4580 PT Time Calculation (min) (ACUTE ONLY): 14 min  Charges:  $Gait Training: 8-22 mins                    G Codes:       Gabe Sherryann Frese, SPT   [employer] 01/08/2018, 4:45 PM

## 2018-01-08 NOTE — Progress Notes (Signed)
[facility]/Trauma Progress Note  3 Days Post-Op   Assessment/Plan Sepsis secondary to Necrotizing fasciitis of perianal region, s/p debridement of necrotic tissue, Dr. Ninfa Linden, 01-02-18, Dr. Grandville Silos 01-04-18, 01-05-18  -Wet to Dry dressing changes to wound along with hydrotherapy. -patient will likely require SNF for wound care at discharge.  Has no help at home and this wound is not in a place where he can change it himself. Social worker consult pending -CX growing e.coli.  On zosyn -doxy for tick  Sinus Arrhythmia -stable Recent UTI ARI - 2.14 on admit, down to 1.04 today HTN -had hypotension on admit, but now hypertensive again that sepsis has resolved/improved. -appreciate medicine's assistance with this. Hyperkalemia/hypomagnesemia  -K is3.2. PO replacement  Recheck BMET in am Morbid obesity -lost 95# after gastric bypass58months ago at [facility] SOB - O2 sats 98% on RA - pt has edema likely from 3rd spacing  - chest xray pending   FEN - regular diet VTE -SCDs/Lovenox ID -zosyn,doxy   WBC still elevated at 21.5, AM CBC Follow up: TBD  Dispo: continue wet to dry dressing changes and hydrotherapy. Social worker consult pending for SNF placement. Chest xray pending   LOS: 6 days    Subjective: CC: butt pain  Pt had chills overnight but no fever. He is complaining of b/l hand a feet swelling and SOB today. No family at bedside. Pt is requesting SNF placement as he has no help at home with dressing changes.   Objective: Vital signs in last 24 hours: Temp:  [97.7 F (36.5 C)-99.2 F (37.3 C)] 98.2 F (36.8 C) (06/14 0850) Pulse Rate:  [68-72] 68 (06/14 0850) Resp:  [18] 18 (06/14 0400) BP: (103-168)/(54-77) 164/73 (06/14 0850) SpO2:  [95 %-100 %] 98 % (06/14 0850) Last BM Date: 01/05/18  Intake/Output from previous day: No intake/output data recorded. Intake/Output this shift: No intake/output data recorded.  PE: Gen:  Alert, NAD, pleasant,  cooperative Card:  RRR, no M/G/R heard Pulm:  Slightly diminished breath sounds b/l bases, rate and effort normal, no wheezes or rales appreciated Abd: Soft, NT/ND Extremities:b/l  hand and feet edema Skin: no rashes noted, warm and dry   Anti-infectives: Anti-infectives (From admission, onward)   Start     Dose/Rate Route Frequency Ordered Stop   01/06/18 2300  piperacillin-tazobactam (ZOSYN) IVPB 3.375 g  Status:  Discontinued     3.375 g 12.5 mL/hr over 240 Minutes Intravenous Every 8 hours 01/06/18 1540 01/06/18 1936   01/06/18 2100  piperacillin-tazobactam (ZOSYN) IVPB 3.375 g     3.375 g 12.5 mL/hr over 240 Minutes Intravenous Every 8 hours 01/06/18 1936     01/06/18 1600  piperacillin-tazobactam (ZOSYN) IVPB 3.375 g  Status:  Discontinued     3.375 g 100 mL/hr over 30 Minutes Intravenous  Once 01/06/18 1545 01/06/18 1935   01/04/18 2200  ceFEPIme (MAXIPIME) 2 g in sodium chloride 0.9 % 100 mL IVPB  Status:  Discontinued    Note to Pharmacy:  Pharm to dose   2 g 200 mL/hr over 30 Minutes Intravenous Every 12 hours 01/04/18 1428 01/06/18 1537   01/04/18 1600  clindamycin (CLEOCIN) IVPB 600 mg  Status:  Discontinued     600 mg 100 mL/hr over 30 Minutes Intravenous Every 8 hours 01/04/18 1505 01/05/18 1145   01/04/18 1515  doxycycline (VIBRA-TABS) tablet 100 mg     100 mg Oral Every 12 hours 01/04/18 1505     01/04/18 1145  ceFEPIme (MAXIPIME) 2 g in  sodium chloride 0.9 % 100 mL IVPB    Note to Pharmacy:  Pharm to dose   2 g 200 mL/hr over 30 Minutes Intravenous To Surgery 01/04/18 1131 01/05/18 1143   01/04/18 1145  metronidazole (FLAGYL) IVPB 500 mg  Status:  Discontinued     500 mg 100 mL/hr over 60 Minutes Intravenous To Surgery 01/04/18 1131 01/04/18 1412   01/03/18 1000  metroNIDAZOLE (FLAGYL) IVPB 500 mg  Status:  Discontinued     500 mg 100 mL/hr over 60 Minutes Intravenous Every 8 hours 01/03/18 0956 01/06/18 1537   01/02/18 1000  ceFEPIme (MAXIPIME) 2 g in sodium  chloride 0.9 % 100 mL IVPB  Status:  Discontinued    Note to Pharmacy:  Pharm to dose   2 g 200 mL/hr over 30 Minutes Intravenous Every 12 hours 01/02/18 0340 01/04/18 1131   01/02/18 0600  vancomycin (VANCOCIN) IVPB 1000 mg/200 mL premix  Status:  Discontinued     1,000 mg 200 mL/hr over 60 Minutes Intravenous Every 12 hours 01/02/18 0409 01/05/18 1102   01/01/18 2330  ceFEPIme (MAXIPIME) 2 g in sodium chloride 0.9 % 100 mL IVPB     2 g 200 mL/hr over 30 Minutes Intravenous  Once 01/01/18 2321 01/02/18 0038   01/01/18 2315  vancomycin (VANCOCIN) 2,000 mg in sodium chloride 0.9 % 500 mL IVPB     2,000 mg 250 mL/hr over 120 Minutes Intravenous  Once 01/01/18 2313 01/05/18 1144   01/01/18 1945  cefTRIAXone (ROCEPHIN) 1 g in sodium chloride 0.9 % 100 mL IVPB     1 g 200 mL/hr over 30 Minutes Intravenous  Once 01/01/18 1932 01/01/18 2202   01/01/18 1945  metroNIDAZOLE (FLAGYL) IVPB 500 mg     500 mg 100 mL/hr over 60 Minutes Intravenous  Once 01/01/18 1932 01/01/18 2317      Lab Results:  Recent Labs    01/07/18 0333 01/08/18 0500  WBC 21.1* 21.5*  HGB 11.8* 11.4*  HCT 34.3* 33.2*  PLT 423* 417*   BMET Recent Labs    01/07/18 0333 01/08/18 0500  NA 140 139  K 3.4* 3.2*  CL 107 107  CO2 24 26  GLUCOSE 86 83  BUN 24* 19  CREATININE 1.11 1.04  CALCIUM 8.4* 8.2*   PT/INR No results for input(s): LABPROT, INR in the last 72 hours. CMP     Component Value Date/Time   NA 139 01/08/2018 0500   K 3.2 (L) 01/08/2018 0500   CL 107 01/08/2018 0500   CO2 26 01/08/2018 0500   GLUCOSE 83 01/08/2018 0500   BUN 19 01/08/2018 0500   CREATININE 1.04 01/08/2018 0500   CREATININE 0.85 08/30/2013 1104   CALCIUM 8.2 (L) 01/08/2018 0500   PROT 6.2 (L) 01/01/2018 1835   ALBUMIN 2.8 (L) 01/01/2018 1835   AST 25 01/01/2018 1835   ALT 20 01/01/2018 1835   ALKPHOS 64 01/01/2018 1835   BILITOT 0.9 01/01/2018 1835   GFRNONAA >60 01/08/2018 0500   GFRAA >60 01/08/2018 0500   Lipase      Component Value Date/Time   LIPASE 42 02/27/2012 0811    Studies/Results: [place] Ekg Site Rite  Result Date: 01/07/2018 If Site Rite image not attached, placement could not be confirmed due to current cardiac rhythm.     Kalman Drape , [facility] 01/08/2018, 10:17 AM  Pager: [phone] Mon-Wed, Friday 7:00am-4:30pm Thurs 7am-11:30am  Consults: [phone]

## 2018-01-08 NOTE — Clinical Social Work Note (Addendum)
Clinical Social Work Assessment  Patient Details  Name: Brandon Marks MRN: [id number] Date of Birth: 1955-12-08  Date of referral:  01/08/18               Reason for consult:  Facility Placement                Permission sought to share information with:  Facility Sport and exercise psychologist, Family Supports Permission granted to share information::  Yes, Verbal Permission Granted  Name::     (pt mother has dementia, unable to serve as contact)  Agency::  SNFs  Relationship::     Contact Information:     Housing/Transportation Living arrangements for the past 2 months:  Single Family Home Source of Information:  Patient Patient Interpreter Needed:  None Criminal Activity/Legal Involvement Pertinent to Current Situation/Hospitalization:  No - Comment as needed Significant Relationships:  Other Family Members, Parents Lives with:  Parents Do you feel safe going back to the place where you live?  Yes Need for family participation in patient care:  Yes (Comment)(dressing changes)  Care giving concerns:  Pt takes care of his mother who has memory issues, pt has no support system to help with wound care dressing changes, and dressing changes are in a sensitive area.    Social Worker assessment / plan:  CSW met with pt at bedside. Pt states he takes care of his mother at home who has advanced dementia. Pt states that he does not have help with dressing changes at home and "will die if I go home." CSW acknowledged senstivity of care and that pt will need to keep that wound clean to stay healthy and well.   CSW provided pt with list of options for SNF, pt states preference for facility close to home. However after reading list pt requests [facility] and [facility]. CSW to f/u on pt care plan for hydrotherapy etc and ensure that selected facilities are able to provide that level of care. CSW will continue to follow.   Employment status:    Insurance information:  Medicare PT  Recommendations:  24 Hour Supervision, Home with [place] / Referral to community resources:  [address]  Patient/Family's Response to care: Pt accepting of CSW visit, pt did ask several questions regarding the racial make up of staff members at SNFs as a factor with decision making.   CSW assured pt that staff at SNFs are trained for wound care and informed pt that diverse staffing is present at all SNFs.    Patient/Family's Understanding of and Emotional Response to Diagnosis, Current Treatment, and Prognosis:  Pt states understanding of diagnosis, current treatment and prognosis. Pt states understanding of limitations of care that he will be able to provide to himself. Pt has arranged for care for his mother while he is at the rehab facility, seems to be handling that situation very stoically.   Emotional Assessment Appearance:  Appears stated age Attitude/Demeanor/Rapport:  Self-Absorbed, Other, Self-Confident(Provocative) Affect (typically observed):  Blunt, Accepting Orientation:  Oriented to Self, Oriented to Place, Oriented to  Time, Oriented to Situation Alcohol / Substance use:  Not Applicable Psych involvement (Current and /or in the community):  No (Comment)  Discharge Needs  Concerns to be addressed:  Care Coordination, Discharge Planning Concerns Readmission within the last 30 days:  No Current discharge risk:  Lack of support system, Physical Impairment Barriers to Discharge:  Continued Medical Work up   [employer], [place] 01/08/2018, 12:31 PM

## 2018-01-08 NOTE — Progress Notes (Signed)
Pt stated he would place self on CPAP when ready for bed.

## 2018-01-08 NOTE — Progress Notes (Signed)
Physical Therapy Wound Treatment Patient Details  Name: JAYLEN KNOPE MRN: [id number] Date of Birth: 11-09-1955  Today's Date: 01/08/2018 Time: 7867-5449 Time Calculation (min): 52 min  Subjective  Subjective: Pt pleasant and agreeable to hydrotherapy Patient and Family Stated Goals: Heal wound Prior Treatments: I&D x3  Pain Score:  Pt received IV pain medication prior to treatment. Facial Pain score with treatment 5/10.  Wound Assessment  Wound / Incision (Open or Dehisced) 01/07/18 Incision - Open Rectum (Active)  Wound Image   01/07/2018  3:51 PM  Dressing Type ABD;Gauze (Comment);Barrier Film (skin prep);Moist to dry 01/08/2018  1:00 PM  Dressing Changed Changed 01/08/2018  1:00 PM  Dressing Status Clean;Dry;Intact 01/08/2018  1:00 PM  Dressing Change Frequency Daily 01/08/2018  1:00 PM  Site / Wound Assessment Pink;Yellow;Brown 01/08/2018  1:00 PM  % Wound base Red or Granulating 25% 01/08/2018  1:00 PM  % Wound base Yellow/Fibrinous Exudate 50% 01/08/2018  1:00 PM  % Wound base Black/Eschar 25% 01/08/2018  1:00 PM  % Wound base Other/Granulation Tissue (Comment) 0% 01/08/2018  1:00 PM  Peri-wound Assessment Intact 01/08/2018  1:00 PM  Wound Length (cm) 7 cm 01/07/2018  3:51 PM  Wound Width (cm) 10.1 cm 01/07/2018  3:51 PM  Wound Depth (cm) 9 cm 01/07/2018  3:51 PM  Wound Volume (cm^3) 636.3 cm^3 01/07/2018  3:51 PM  Wound Surface Area (cm^2) 70.7 cm^2 01/07/2018  3:51 PM  Tunneling (cm) 6.5 cm at 9:00, 8.5 cm at 1:00 01/07/2018  3:51 PM  Margins Unattached edges (unapproximated) 01/08/2018  1:00 PM  Closure None 01/08/2018  1:00 PM  Drainage Amount Minimal 01/08/2018  1:00 PM  Drainage Description Purulent 01/08/2018  1:00 PM  Treatment Debridement (Selective);Cleansed;Hydrotherapy (Pulse lavage);Packing (Saline gauze) 01/08/2018  1:00 PM     Incision (Closed) 01/05/18 Rectum (Active)  Dressing Type ABD;Gauze (Comment) 01/07/2018  8:00 PM  Dressing Clean;Dry;Intact 01/07/2018  8:00 PM    Dressing Change Frequency PRN 01/07/2018  8:00 PM  Site / Wound Assessment Dressing in place / Unable to assess 01/07/2018  8:00 PM  Margins Unattached edges (unapproximated) 01/07/2018  1:00 PM  Closure None 01/07/2018  8:00 PM  Drainage Amount Minimal 01/07/2018  1:00 PM  Treatment Other (Comment) 01/07/2018 10:45 AM   Hydrotherapy Pulsed lavage therapy - wound location: perirectal wound Pulsed Lavage with Suction (psi): (4 psi in the tunnels, 8 psi everywhere else) Pulsed Lavage with Suction - Normal Saline Used: 1000 mL Selective Debridement Selective Debridement - Location: perirectal wound Selective Debridement - Tools Used: Forceps;Scalpel;Scissors Selective Debridement - Tissue Removed: yellow and brown necrotic tissue   Wound Assessment and Plan  Wound Therapy - Assess/Plan/Recommendations Wound Therapy - Clinical Statement: Pt with minor fouling of dressing with stool on entry, area cleasned prior to pulsed lavage. Small amount of necrotic tissue able to be debrided from wound. Pt will continue to benefoit from hydrotherapy to decreace bioburden and promote wound bed healing.  Wound Therapy - Functional Problem List: Decreased tolerance for OOB and positioning Factors Delaying/Impairing Wound Healing: Infection - systemic/local Hydrotherapy Plan: Debridement;Dressing change;Electrical stimulation;Patient/family education Wound Therapy - Frequency: 6X / week Wound Therapy - Follow Up Recommendations: Skilled nursing facility Wound Plan: See above  Wound Therapy Goals- Improve the function of patient's integumentary system by progressing the wound(s) through the phases of wound healing (inflammation - proliferation - remodeling) by: Decrease Necrotic Tissue to: 25% Decrease Necrotic Tissue - Progress: Progressing toward goal Increase Granulation Tissue to: 75% Increase Granulation Tissue - Progress:  Progressing toward goal Goals/treatment plan/discharge plan were made with and  agreed upon by patient/family: Yes Time For Goal Achievement: 7 days Wound Therapy - Potential for Goals: Good  Goals will be updated until maximal potential achieved or discharge criteria met.  Discharge criteria: when goals achieved, discharge from hospital, MD decision/surgical intervention, no progress towards goals, refusal/missing three consecutive treatments without notification or medical reason.  GP    Dani Gobble. Migdalia Dk PT, DPT Acute Rehabilitation  [phone] Pager [phone]   [place] 01/08/2018, 1:21 PM

## 2018-01-09 DIAGNOSIS — E8770 Fluid overload, unspecified: Secondary | ICD-10-CM

## 2018-01-09 DIAGNOSIS — R0602 Shortness of breath: Secondary | ICD-10-CM

## 2018-01-09 DIAGNOSIS — I10 Essential (primary) hypertension: Secondary | ICD-10-CM

## 2018-01-09 LAB — COMPREHENSIVE METABOLIC PANEL
ALBUMIN: 2 g/dL — AB (ref 3.5–5.0)
ALK PHOS: 50 U/L (ref 38–126)
ALT: 15 U/L — AB (ref 17–63)
AST: 16 U/L (ref 15–41)
Anion gap: 4 — ABNORMAL LOW (ref 5–15)
BILIRUBIN TOTAL: 0.7 mg/dL (ref 0.3–1.2)
BUN: 17 mg/dL (ref 6–20)
CALCIUM: 8.3 mg/dL — AB (ref 8.9–10.3)
CO2: 29 mmol/L (ref 22–32)
CREATININE: 1.06 mg/dL (ref 0.61–1.24)
Chloride: 107 mmol/L (ref 101–111)
GFR calc Af Amer: >60 mL/min (ref >60)
GFR calc non Af Amer: >60 mL/min (ref >60)
GLUCOSE: 94 mg/dL (ref 65–99)
Potassium: 3.5 mmol/L (ref 3.5–5.1)
SODIUM: 140 mmol/L (ref 135–145)
TOTAL PROTEIN: 5 g/dL — AB (ref 6.5–8.1)

## 2018-01-09 LAB — C DIFFICILE QUICK SCREEN W PCR REFLEX
C DIFFICILE (CDIFF) INTERP: NOT DETECTED
C Diff antigen: NEGATIVE
C Diff toxin: NEGATIVE

## 2018-01-09 LAB — AEROBIC/ANAEROBIC CULTURE W GRAM STAIN (SURGICAL/DEEP WOUND)

## 2018-01-09 LAB — AEROBIC/ANAEROBIC CULTURE (SURGICAL/DEEP WOUND)

## 2018-01-09 MED ORDER — VITAMIN B-12 100 MCG PO TABS
500.0000 ug | ORAL_TABLET | Freq: Every day | ORAL | Status: DC
  Administered 2018-01-09 – 2018-01-12 (×4): 500 ug via ORAL
  Filled 2018-01-09 (×5): qty 5

## 2018-01-09 MED ORDER — ENOXAPARIN SODIUM 80 MG/0.8ML ~~LOC~~ SOLN
80.0000 mg | SUBCUTANEOUS | Status: DC
  Administered 2018-01-10 – 2018-01-11 (×2): 80 mg via SUBCUTANEOUS
  Filled 2018-01-09 (×3): qty 0.8

## 2018-01-09 MED ORDER — FUROSEMIDE 10 MG/ML IJ SOLN
20.0000 mg | Freq: Once | INTRAMUSCULAR | Status: AC
  Administered 2018-01-09: 20 mg via INTRAVENOUS
  Filled 2018-01-09: qty 2

## 2018-01-09 MED ORDER — PROSIGHT PO TABS
1.0000 | ORAL_TABLET | Freq: Every day | ORAL | Status: DC
  Administered 2018-01-09 – 2018-01-12 (×4): 1 via ORAL
  Filled 2018-01-09 (×4): qty 1

## 2018-01-09 NOTE — Progress Notes (Signed)
Physical Therapy Wound Treatment Patient Details  Name: TABOR DENHAM MRN: [id number] Date of Birth: [birth year]  Today's Date: 01/09/2018 Time: 1000-1045 Time Calculation (min): 45 min  Subjective  Subjective: Pt pleasant and agreeable to hydrotherapy Patient and Family Stated Goals: Heal wound Prior Treatments: I&D x3  Pain Score:  Pt was premedicated and tolerated treatment well.  Wound Assessment  Wound / Incision (Open or Dehisced) 01/07/18 Incision - Open Rectum (Active)  Wound Image   01/07/2018  3:51 PM  Dressing Type ABD;Gauze (Comment);Barrier Film (skin prep);Moist to dry;Paper tape 01/09/2018 11:50 AM  Dressing Changed Changed 01/09/2018 11:50 AM  Dressing Status Clean;Dry;Intact 01/09/2018 11:50 AM  Dressing Change Frequency Daily 01/09/2018 11:50 AM  Site / Wound Assessment Pink;Yellow;Brown 01/09/2018 11:50 AM  % Wound base Red or Granulating 30% 01/09/2018 11:50 AM  % Wound base Yellow/Fibrinous Exudate 50% 01/09/2018 11:50 AM  % Wound base Black/Eschar 20% 01/09/2018 11:50 AM  % Wound base Other/Granulation Tissue (Comment) 0% 01/09/2018 11:50 AM  Peri-wound Assessment Intact 01/09/2018 11:50 AM  Wound Length (cm) 7 cm 01/07/2018  3:51 PM  Wound Width (cm) 10.1 cm 01/07/2018  3:51 PM  Wound Depth (cm) 9 cm 01/07/2018  3:51 PM  Wound Volume (cm^3) 636.3 cm^3 01/07/2018  3:51 PM  Wound Surface Area (cm^2) 70.7 cm^2 01/07/2018  3:51 PM  Tunneling (cm) 6.5 cm at 9:00, 8.5 cm at 1:00 01/07/2018  3:51 PM  Margins Unattached edges (unapproximated) 01/09/2018 11:50 AM  Closure None 01/09/2018 11:50 AM  Drainage Amount Minimal 01/09/2018 11:50 AM  Drainage Description Serosanguineous 01/09/2018 11:50 AM  Treatment Debridement (Selective);Hydrotherapy (Pulse lavage);Packing (Saline gauze) 01/09/2018 11:50 AM      Hydrotherapy Pulsed lavage therapy - wound location: perirectal wound Pulsed Lavage with Suction (psi): (4 psi in the tunnels, 8 psi everywhere else) Pulsed Lavage with Suction -  Normal Saline Used: 1000 mL Pulsed Lavage Tip: Tip with splash shield Selective Debridement Selective Debridement - Location: perirectal wound Selective Debridement - Tools Used: Forceps;Scalpel Selective Debridement - Tissue Removed: yellow and brown necrotic tissue   Wound Assessment and Plan  Wound Therapy - Assess/Plan/Recommendations Wound Therapy - Clinical Statement: Skin surrounding wound red and inflamed due to taping and multiple dressing changes yesterday 2 frequent diarrhea episodes. Used copious amounts of skin prep today and places dressing on the R side of the patient's buttocks only to minimize taping. Selectively taped in areas that were the least irritated appearing. This patient will continue to benefit from hydrotherapy for selective removal of nonviable tissue, and to promote wound bed healing.  Wound Therapy - Functional Problem List: Decreased tolerance for OOB and positioning Factors Delaying/Impairing Wound Healing: Infection - systemic/local Hydrotherapy Plan: Debridement;Dressing change;Electrical stimulation;Patient/family education Wound Therapy - Frequency: 6X / week Wound Therapy - Follow Up Recommendations: Skilled nursing facility Wound Plan: See above  Wound Therapy Goals- Improve the function of patient's integumentary system by progressing the wound(s) through the phases of wound healing (inflammation - proliferation - remodeling) by: Decrease Necrotic Tissue to: 25% Decrease Necrotic Tissue - Progress: Progressing toward goal Increase Granulation Tissue to: 75% Increase Granulation Tissue - Progress: Progressing toward goal Goals/treatment plan/discharge plan were made with and agreed upon by patient/family: Yes Time For Goal Achievement: 7 days Wound Therapy - Potential for Goals: Good  Goals will be updated until maximal potential achieved or discharge criteria met.  Discharge criteria: when goals achieved, discharge from hospital, MD  decision/surgical intervention, no progress towards goals, refusal/missing three consecutive treatments without notification or  medical reason.  GP     Thelma Comp 01/09/2018, 12:03 PM   Rolinda Roan, PT, DPT Acute Rehabilitation Services Pager: [phone]

## 2018-01-09 NOTE — Progress Notes (Signed)
PROGRESS NOTE  Brandon Marks:[id number] DOB: [birth year] DOA: 01/01/2018 PCP: Tamsen Roers, MD   LOS: 7 days   Brief Narrative / Interim history: 62 year old male with history of morbid obesity status post gastric bypass, hypertension, sleep apnea on CPAP who presented with perianal pain, found to have perirectal abscess with deep necrotizing fasciitis.  He was admitted to general surgery service and we were asked to consult for acute kidney injury and hypertension.  Assessment & Plan: Active Problems:   Necrotizing fasciitis ([place])   Acute kidney injury -Likely prerenal in the setting of sepsis, he received IV fluids antibiotics.  Kidney function has normalized -Now with fluid overload in the setting of sepsis, has received IV Lasix on 6/14, still has swelling and will repeat the dose of Lasix today 6/15  Sepsis due to necrotizing fasciitis -Management per primary team, patient underwent I&D on 6/8, 6/10, 6/11 -Defer antibiotics to surgical team -Wound microbiology with moderate E. coli which is pansensitive, also with gram-positive rods, gram-negative rods, actinomyces, final pending -Continue hydrotherapy, blood cultures have remained negative  Hypertension -Resume lisinopril and HCTZ on 6/12, HCTZ discontinued on 6/14 -Controlled today, continue IV Lasix as described above  Sinus arrhythmia -Occasional PACs and PVCs, doubt clinically significant  Fluid overload -Improving, continue IV Lasix, he has several unmeasured urine outputs so unable to quantify adequately the I&Os  Diarrhea -Likely in the setting of antibiotics, C. difficile was negative   DVT prophylaxis: Lovenox Code Status: Full code Family Communication: No family present at bedside Disposition Plan: SNF pending next week  Procedures:   I&D 6/8, 6/10, 6/11  Antimicrobials:  Vancomycin   Cefepime  Metronidazole  Clindamycin  Doxycycline    Subjective: -Continues to have loose bowel  movements, C. difficile was negative  Objective: Vitals:   01/08/18 1910 01/09/18 0535 01/09/18 0700 01/09/18 1125  BP: (!) 124/55 (!) 127/58 (!) 132/54 125/62  Pulse: 78 78 74 78  Resp:   18 18  Temp: 99.4 F (37.4 C) 98.9 F (37.2 C) 99.1 F (37.3 C) 98.7 F (37.1 C)  TempSrc: Oral Oral Oral Oral  SpO2: 98% 96% 96% 94%  Weight:      Height:        Intake/Output Summary (Last 24 hours) at 01/09/2018 1156 Last data filed at 01/08/2018 2305 Gross per 24 hour  Intake 240 ml  Output 150 ml  Net 90 ml   Filed Weights   01/02/18 0351 01/02/18 0634 01/05/18 1139  Weight: (!) 147 kg (324 lb 1.2 oz) (!) 147 kg (324 lb 1.2 oz) (!) 155.5 kg (342 lb 13 oz)    Examination:  Constitutional: No distress Eyes: No scleral icterus Neck: Normal, supple Respiratory: Clear to auscultation bilaterally without wheezing, no crackles heard.  Normal respiratory effort. Cardiovascular: Regular rate and rhythm, no murmurs heard.  1+ lower extremity edema Abdomen: Soft, nontender, nondistended, positive bowel sounds Neurologic: Ambulatory    Data Reviewed: I have independently reviewed following labs and imaging studies   CBC: Recent Labs  Lab 01/03/18 0154 01/04/18 0310 01/06/18 1107 01/07/18 0333 01/08/18 0500  WBC 18.8* 22.3* 22.7* 21.1* 21.5*  HGB 11.7* 12.4* 12.5* 11.8* 11.4*  HCT 33.9* 35.7* 36.9* 34.3* 33.2*  MCV 85.6 85.0 86.6 86.2 85.6  PLT 342 372 432* 423* 811*   Basic Metabolic Panel: Recent Labs  Lab 01/03/18 0154 01/04/18 0310 01/06/18 1107 01/07/18 0333 01/08/18 0500 01/09/18 0530  NA 139 138 140 140 139 140  K 3.1* 3.6 3.6 3.4*  3.2* 3.5  CL 107 107 109 107 107 107  CO2 23 24 24 24 26 29   GLUCOSE 90 99 95 86 83 94  BUN 41* 29* 30* 24* 19 17  CREATININE 1.56* 1.28* 1.17 1.11 1.04 1.06  CALCIUM 7.9* 8.4* 8.4* 8.4* 8.2* 8.3*  MG 1.5* 1.6*  --   --   --   --    GFR: Estimated Creatinine Clearance: 111.9 mL/min (by C-G formula based on SCr of 1.06  mg/dL). Liver Function Tests: Recent Labs  Lab 01/09/18 0530  AST 16  ALT 15*  ALKPHOS 50  BILITOT 0.7  PROT 5.0*  ALBUMIN 2.0*   No results for input(s): LIPASE, AMYLASE in the last 168 hours. No results for input(s): AMMONIA in the last 168 hours. Coagulation Profile: No results for input(s): INR, PROTIME in the last 168 hours. Cardiac Enzymes: No results for input(s): CKTOTAL, CKMB, CKMBINDEX, TROPONINI in the last 168 hours. BNP (last 3 results) No results for input(s): PROBNP in the last 8760 hours. HbA1C: No results for input(s): HGBA1C in the last 72 hours. CBG: No results for input(s): GLUCAP in the last 168 hours. Lipid Profile: No results for input(s): CHOL, HDL, LDLCALC, TRIG, CHOLHDL, LDLDIRECT in the last 72 hours. Thyroid Function Tests: No results for input(s): TSH, T4TOTAL, FREET4, T3FREE, THYROIDAB in the last 72 hours. Anemia Panel: No results for input(s): VITAMINB12, FOLATE, FERRITIN, TIBC, IRON, RETICCTPCT in the last 72 hours. Urine analysis:    Component Value Date/Time   COLORURINE AMBER (A) 01/02/2018 1123   APPEARANCEUR HAZY (A) 01/02/2018 1123   LABSPEC 1.019 01/02/2018 1123   PHURINE 5.0 01/02/2018 1123   GLUCOSEU NEGATIVE 01/02/2018 1123   HGBUR SMALL (A) 01/02/2018 1123   BILIRUBINUR NEGATIVE 01/02/2018 1123   KETONESUR NEGATIVE 01/02/2018 1123   PROTEINUR NEGATIVE 01/02/2018 1123   UROBILINOGEN 0.2 03/29/2017 1643   NITRITE NEGATIVE 01/02/2018 1123   LEUKOCYTESUR SMALL (A) 01/02/2018 1123   Sepsis Labs: Invalid input(s): PROCALCITONIN, LACTICIDVEN  Recent Results (from the past 240 hour(s))  Blood culture (routine x 2)     Status: None   Collection Time: 01/01/18  6:35 PM  Result Value Ref Range Status   Specimen Description BLOOD RIGHT ANTECUBITAL  Final   Special Requests   Final    BOTTLES DRAWN AEROBIC AND ANAEROBIC Blood Culture adequate volume   Culture   Final    NO GROWTH 5 DAYS Performed at [facility], [address] [place], [place] [address]    Report Status 01/06/2018 FINAL  Final  Blood culture (routine x 2)     Status: None   Collection Time: 01/01/18  6:35 PM  Result Value Ref Range Status   Specimen Description BLOOD LEFT ANTECUBITAL  Final   Special Requests   Final    BOTTLES DRAWN AEROBIC AND ANAEROBIC Blood Culture adequate volume   Culture   Final    NO GROWTH 5 DAYS Performed at [facility], [place] [address], [place], [place] [address]    Report Status 01/06/2018 FINAL  Final  Fungus Culture With Stain     Status: None (Preliminary result)   Collection Time: 01/02/18  1:50 AM  Result Value Ref Range Status   Fungus Stain Final report  Final    Comment: (NOTE) Performed At: [facility] [place], [place] [id number] Rush Farmer MD DX:[id number]    Fungus (Mycology) Culture PENDING  Incomplete   Fungal Source ABSCESS  Final  Comment: PERIRECTAL  Aerobic/Anaerobic Culture (surgical/deep wound)     Status: None   Collection Time: 01/02/18  1:50 AM  Result Value Ref Range Status   Specimen Description ABSCESS PERIRECTAL  Final   Special Requests NONE  Final   Gram Stain   Final    RARE WBC PRESENT, PREDOMINANTLY PMN MODERATE GRAM POSITIVE COCCI IN PAIRS FEW GRAM POSITIVE RODS FEW GRAM NEGATIVE RODS Performed at [facility], [place] [address], [place], [place] [address]    Culture   Final    MODERATE ESCHERICHIA COLI MODERATE ACTINOMYCES SPECIES Standardized susceptibility testing for this organism is not available. MIXED ANAEROBIC FLORA PRESENT.  CALL LAB IF FURTHER IID REQUIRED.    Report Status 01/07/2018 FINAL  Final   Organism ID, Bacteria ESCHERICHIA COLI  Final      Susceptibility   Escherichia coli - MIC*    AMPICILLIN 8 SENSITIVE Sensitive     CEFAZOLIN <=4 SENSITIVE Sensitive     CEFEPIME <=1 SENSITIVE Sensitive     CEFTAZIDIME <=1 SENSITIVE Sensitive     CEFTRIAXONE <=1 SENSITIVE Sensitive     CIPROFLOXACIN <=0.25  SENSITIVE Sensitive     GENTAMICIN <=1 SENSITIVE Sensitive     IMIPENEM <=0.25 SENSITIVE Sensitive     TRIMETH/SULFA <=20 SENSITIVE Sensitive     AMPICILLIN/SULBACTAM 4 SENSITIVE Sensitive     PIP/TAZO <=4 SENSITIVE Sensitive     Extended ESBL NEGATIVE Sensitive     * MODERATE ESCHERICHIA COLI  Fungus Stain     Status: None   Collection Time: 01/02/18  1:50 AM  Result Value Ref Range Status   FUNGUS STAIN REFERT  Final    Comment: (NOTE) Please refer to the following specimen for additional lab results.      Reference [id number] for Fungus Stain results      that are included in test# [id number] Performed At: [facility] [place], [place] [id number] Rush Farmer MD JH:[id number]    Fungal Source ABSCESS  Final    Comment: PERIRECTAL  Fungus Culture Result     Status: None   Collection Time: 01/02/18  1:50 AM  Result Value Ref Range Status   Result 1 Comment  Final    Comment: (NOTE) KOH/Calcofluor preparation:  no fungus observed. Performed At: [facility] [place], [place] [id number] Rush Farmer MD WY:[id number]   Culture, Urine     Status: None   Collection Time: 01/02/18  9:06 PM  Result Value Ref Range Status   Specimen Description URINE, CLEAN CATCH  Final   Special Requests NONE  Final   Culture   Final    NO GROWTH Performed at [facility], [place] [address] [place], [place] [address]    Report Status 01/04/2018 FINAL  Final  Aerobic/Anaerobic Culture (surgical/deep wound)     Status: None (Preliminary result)   Collection Time: 01/04/18 12:13 PM  Result Value Ref Range Status   Specimen Description WOUND  Final   Special Requests BUTTOCKS  Final   Gram Stain   Final    RARE WBC PRESENT, PREDOMINANTLY PMN MODERATE GRAM VARIABLE ROD FEW GRAM POSITIVE COCCI IN PAIRS Performed at [facility], [place] [address], [place], [place] [address]    Culture   Final    FEW ESCHERICHIA COLI NO ANAEROBES  ISOLATED; CULTURE IN PROGRESS FOR 5 DAYS    Report Status PENDING  Incomplete   Organism ID, Bacteria ESCHERICHIA COLI  Final  Susceptibility   Escherichia coli - MIC*    AMPICILLIN 8 SENSITIVE Sensitive     CEFAZOLIN <=4 SENSITIVE Sensitive     CEFEPIME <=1 SENSITIVE Sensitive     CEFTAZIDIME <=1 SENSITIVE Sensitive     CEFTRIAXONE <=1 SENSITIVE Sensitive     CIPROFLOXACIN <=0.25 SENSITIVE Sensitive     GENTAMICIN <=1 SENSITIVE Sensitive     IMIPENEM <=0.25 SENSITIVE Sensitive     TRIMETH/SULFA <=20 SENSITIVE Sensitive     AMPICILLIN/SULBACTAM 4 SENSITIVE Sensitive     PIP/TAZO <=4 SENSITIVE Sensitive     Extended ESBL NEGATIVE Sensitive     * FEW ESCHERICHIA COLI  C difficile quick scan w PCR reflex     Status: None   Collection Time: 01/08/18 11:08 PM  Result Value Ref Range Status   C Diff antigen NEGATIVE NEGATIVE Final   C Diff toxin NEGATIVE NEGATIVE Final   C Diff interpretation No C. difficile detected.  Final      Radiology Studies: Dg Chest Port 1 View  Result Date: 01/08/2018 CLINICAL DATA:  Shortness of breath EXAM: PORTABLE CHEST 1 VIEW COMPARISON:  February 27, 2012 FINDINGS: Central catheter tip is in the superior vena cava. No pneumothorax. No edema or consolidation. Heart is mildly enlarged with pulmonary vascularity normal. No adenopathy. No bone lesions. IMPRESSION: Central catheter tip in superior vena cava. No pneumothorax. No edema or consolidation. Stable cardiac prominence. Electronically Signed   By: Lowella Grip III M.D.   On: 01/08/2018 11:56   [place] Ekg Site Rite  Result Date: 01/07/2018 If Site Rite image not attached, placement could not be confirmed due to current cardiac rhythm.    Scheduled Meds: . bethanechol  50 mg Oral TID  . Chlorhexidine Gluconate Cloth  6 each Topical Q0600  . docusate sodium  100 mg Oral BID  . doxycycline  100 mg Oral Q12H  . [START ON 01/10/2018] enoxaparin (LOVENOX) injection  80 mg Subcutaneous Q24H  .  furosemide  20 mg Intravenous Once  . lisinopril  10 mg Oral Daily  . multivitamin  1 tablet Oral Daily  . polyethylene glycol  17 g Oral Daily  . potassium chloride  40 mEq Oral BID  . sodium chloride flush  10-40 mL Intracatheter Q12H  . tamsulosin  0.4 mg Oral Daily  . vitamin B-12  500 mcg Oral Daily   Continuous Infusions: . lactated ringers 10 mL/hr at 01/05/18 1201  . lactated ringers 10 mL/hr at 01/05/18 1201  . piperacillin-tazobactam (ZOSYN)  IV Stopped (01/09/18 2841)    Marzetta Board, MD, PhD [facility] Pager [phone]  If 7PM-7AM, please contact night-coverage [email or web address] Password [facility] 01/09/2018, 11:56 AM

## 2018-01-09 NOTE — Progress Notes (Addendum)
[facility]/Trauma Progress Note  4 Days Post-Op   Assessment/Plan Sepsis secondary to Necrotizing fasciitis of perianal region, s/p debridement of necrotic tissue, Dr. Ninfa Linden, 01-02-18, Dr. Grandville Silos 01-04-18, 01-05-18 -Wet to Dry dressing changes to wound along with hydrotherapy. Paper tape, monitor rash on bottom -SNF for wound care at discharge. no help at home and this wound is not in a place where he can change it himself. Social worker consult pending First CX e coli and actinomyces, Next culture e coli Plan Zosyn until 6/16 then Augmentin PO for 7d. - doxy for tick  Sinus Arrhythmia -stable Recent UTI ARI - 2.14 on admit, down to 1.06 today HTN -had hypotension on admit, but now hypertensive again that sepsis has resolved/improved. -appreciate medicine's assistance with this. Hyperkalemia/hypomagnesemia  -K is3.5. PO replacement  Morbid obesity -lost 95# after gastric bypass2months ago at [facility] SOB - O2 sats 98% on RA - pt has edema likely from 3rd spacing, medicine gave lasix - chest xray 06/14 neg for any acute abnormalities, breathing improved today per pt  FEN - regular diet VTE -SCDs/Lovenox ID -zosyn,doxy   WBC still elevated at 21.5, AM CBC Follow up: TBD  Dispo: continue wet to dry dressing changes and hydrotherapy. Social worker consult pending for SNF placement. Am labs. Diarrhea, C diff neg   LOS: 7 days    Subjective: CC: buttock pain, diarrhea  Pt states multiple episodes of diarrhea overnight. Nurse notified medicine and c diff was ordered. Pt states he is breathing better. No vomiting overnight. He is tolerating his diet. No fevers. Complaining of irritated skin tag on left butt cheek.   Objective: Vital signs in last 24 hours: Temp:  [98.4 F (36.9 C)-99.4 F (37.4 C)] 99.1 F (37.3 C) (06/15 0700) Pulse Rate:  [74-78] 74 (06/15 0700) Resp:  [18] 18 (06/15 0700) BP: (106-132)/(48-58) 132/54 (06/15 0700) SpO2:  [95 %-98 %]  96 % (06/15 0700) Last BM Date: 01/08/18  Intake/Output from previous day: 06/14 0701 - 06/15 0700 In: 240 [P.O.:240] Out: 150 [Urine:150] Intake/Output this shift: No intake/output data recorded.  PE: Gen:  Alert, NAD, pleasant, cooperative Pulm:  CTA b/l, rate and effort normal, no wheezes or rales appreciated Abd: Soft, ND Extremities:b/l  hand and feet edema GU: wound is clean (see photo below). Small skin tag to left buttock, rash to bottom as seen in photo is likely from tape as there is another place of a similar rash on left buttock where tape was, use paper tape Skin: no rashes noted, warm and dry      Anti-infectives: Anti-infectives (From admission, onward)   Start     Dose/Rate Route Frequency Ordered Stop   01/06/18 2300  piperacillin-tazobactam (ZOSYN) IVPB 3.375 g  Status:  Discontinued     3.375 g 12.5 mL/hr over 240 Minutes Intravenous Every 8 hours 01/06/18 1540 01/06/18 1936   01/06/18 2100  piperacillin-tazobactam (ZOSYN) IVPB 3.375 g     3.375 g 12.5 mL/hr over 240 Minutes Intravenous Every 8 hours 01/06/18 1936     01/06/18 1600  piperacillin-tazobactam (ZOSYN) IVPB 3.375 g  Status:  Discontinued     3.375 g 100 mL/hr over 30 Minutes Intravenous  Once 01/06/18 1545 01/06/18 1935   01/04/18 2200  ceFEPIme (MAXIPIME) 2 g in sodium chloride 0.9 % 100 mL IVPB  Status:  Discontinued    Note to Pharmacy:  Pharm to dose   2 g 200 mL/hr over 30 Minutes Intravenous Every 12 hours 01/04/18 1428 01/06/18  1537   01/04/18 1600  clindamycin (CLEOCIN) IVPB 600 mg  Status:  Discontinued     600 mg 100 mL/hr over 30 Minutes Intravenous Every 8 hours 01/04/18 1505 01/05/18 1145   01/04/18 1515  doxycycline (VIBRA-TABS) tablet 100 mg     100 mg Oral Every 12 hours 01/04/18 1505     01/04/18 1145  ceFEPIme (MAXIPIME) 2 g in sodium chloride 0.9 % 100 mL IVPB    Note to Pharmacy:  Pharm to dose   2 g 200 mL/hr over 30 Minutes Intravenous To Surgery 01/04/18 1131 01/05/18  1143   01/04/18 1145  metronidazole (FLAGYL) IVPB 500 mg  Status:  Discontinued     500 mg 100 mL/hr over 60 Minutes Intravenous To Surgery 01/04/18 1131 01/04/18 1412   01/03/18 1000  metroNIDAZOLE (FLAGYL) IVPB 500 mg  Status:  Discontinued     500 mg 100 mL/hr over 60 Minutes Intravenous Every 8 hours 01/03/18 0956 01/06/18 1537   01/02/18 1000  ceFEPIme (MAXIPIME) 2 g in sodium chloride 0.9 % 100 mL IVPB  Status:  Discontinued    Note to Pharmacy:  Pharm to dose   2 g 200 mL/hr over 30 Minutes Intravenous Every 12 hours 01/02/18 0340 01/04/18 1131   01/02/18 0600  vancomycin (VANCOCIN) IVPB 1000 mg/200 mL premix  Status:  Discontinued     1,000 mg 200 mL/hr over 60 Minutes Intravenous Every 12 hours 01/02/18 0409 01/05/18 1102   01/01/18 2330  ceFEPIme (MAXIPIME) 2 g in sodium chloride 0.9 % 100 mL IVPB     2 g 200 mL/hr over 30 Minutes Intravenous  Once 01/01/18 2321 01/02/18 0038   01/01/18 2315  vancomycin (VANCOCIN) 2,000 mg in sodium chloride 0.9 % 500 mL IVPB     2,000 mg 250 mL/hr over 120 Minutes Intravenous  Once 01/01/18 2313 01/05/18 1144   01/01/18 1945  cefTRIAXone (ROCEPHIN) 1 g in sodium chloride 0.9 % 100 mL IVPB     1 g 200 mL/hr over 30 Minutes Intravenous  Once 01/01/18 1932 01/01/18 2202   01/01/18 1945  metroNIDAZOLE (FLAGYL) IVPB 500 mg     500 mg 100 mL/hr over 60 Minutes Intravenous  Once 01/01/18 1932 01/01/18 2317      Lab Results:  Recent Labs    01/07/18 0333 01/08/18 0500  WBC 21.1* 21.5*  HGB 11.8* 11.4*  HCT 34.3* 33.2*  PLT 423* 417*   BMET Recent Labs    01/08/18 0500 01/09/18 0530  NA 139 140  K 3.2* 3.5  CL 107 107  CO2 26 29  GLUCOSE 83 94  BUN 19 17  CREATININE 1.04 1.06  CALCIUM 8.2* 8.3*   PT/INR No results for input(s): LABPROT, INR in the last 72 hours. CMP     Component Value Date/Time   NA 140 01/09/2018 0530   K 3.5 01/09/2018 0530   CL 107 01/09/2018 0530   CO2 29 01/09/2018 0530   GLUCOSE 94 01/09/2018  0530   BUN 17 01/09/2018 0530   CREATININE 1.06 01/09/2018 0530   CREATININE 0.85 08/30/2013 1104   CALCIUM 8.3 (L) 01/09/2018 0530   PROT 5.0 (L) 01/09/2018 0530   ALBUMIN 2.0 (L) 01/09/2018 0530   AST 16 01/09/2018 0530   ALT 15 (L) 01/09/2018 0530   ALKPHOS 50 01/09/2018 0530   BILITOT 0.7 01/09/2018 0530   GFRNONAA >60 01/09/2018 0530   GFRAA >60 01/09/2018 0530   Lipase     Component Value Date/Time  LIPASE 42 02/27/2012 3976    Studies/Results: Dg Chest Port 1 View  Result Date: 01/08/2018 CLINICAL DATA:  Shortness of breath EXAM: PORTABLE CHEST 1 VIEW COMPARISON:  February 27, 2012 FINDINGS: Central catheter tip is in the superior vena cava. No pneumothorax. No edema or consolidation. Heart is mildly enlarged with pulmonary vascularity normal. No adenopathy. No bone lesions. IMPRESSION: Central catheter tip in superior vena cava. No pneumothorax. No edema or consolidation. Stable cardiac prominence. Electronically Signed   By: Lowella Grip III M.D.   On: 01/08/2018 11:56   [place] Ekg Site Rite  Result Date: 01/07/2018 If Site Rite image not attached, placement could not be confirmed due to current cardiac rhythm.     Kalman Drape , [facility] 01/09/2018, 9:52 AM  Pager: [phone] Mon-Wed, Friday 7:00am-4:30pm Thurs 7am-11:30am  Consults: [phone]

## 2018-01-09 NOTE — Progress Notes (Signed)
Pt stated he would place self on CPAP when ready. RT will continue to monitor as needed.

## 2018-01-09 NOTE — Plan of Care (Addendum)
Nutrition Education Note  RD consulted for diet education regarding hx of RYGB and concern of patient having dumping-related symptoms. Had diarrhea x8 yesterday. Though patient believes these episodes were IV abx related and did not occur around his meal times, he is still agreeable to speak with RD.   RD inquired about whether or not compliant with vitamin regim.en He says he is taking the multivitamins and other supplements that he was told too, though he cannot name them specifically w/o RD first mentioning them. Currently he is only ordered a single mvi (not chewable) and B12. Would consider adding typical baratric supplement regimen of 2 chewable multivitamins daily w/ iron, 1500 mg Calcium Citrate, divided in 3 doses, and 350-500 mcg sublingual B12. Iron containing MVI and calcium should be given atleast 2 hrs apart.   RD reviewed patients understanding of RYGB diet. He reports that he chews his food extremely well (30x), as he had been instructed. He is also making sure that his bites and portions are of smaller size. He is prioritizing protein and still drinks protein shakes at home. He says he drinks a lot of water, though may not be quite 64 oz. He avoids caffeine.   We discussed dumping related behaviors ie sugary/greasy foods and timing of fluids. He does report drinking orange juice as well as having a gravy biscuit yesterday. He has OJ at bedside. Reviewed that greasy and high simple sugar items can cause dumping syndrome. He is advised to avoid the gravy and any item that has >25g sugar/serving ie juice/soda. Ideally all fluids should be sugar free. He also reports drinking some fluids with his meals. Reviewed that he should try his best to consume fluids 15 min prior to meals and 30 min after meals. He says he has to have something to drink to "help it down". Recommended taking only small sips. He acknowledges these recommendations.   Education session was slightly hampered by patients  proclivity to getting off topic.   RD gave copy of diet for Maintenance/lifelong Phase of RYGB diet which contained all above discussed information.   Burtis Junes RD, LDN, CNSC Clinical Nutrition Available Tues-Sat via Pager: [phone] 01/09/2018 6:09 PM

## 2018-01-10 LAB — CBC
HCT: 31.6 % — ABNORMAL LOW (ref 39.0–52.0)
Hemoglobin: 10.7 g/dL — ABNORMAL LOW (ref 13.0–17.0)
MCH: 29 pg (ref 26.0–34.0)
MCHC: 33.9 g/dL (ref 30.0–36.0)
MCV: 85.6 fL (ref 78.0–100.0)
PLATELETS: 378 10*3/uL (ref 150–400)
RBC: 3.69 MIL/uL — ABNORMAL LOW (ref 4.22–5.81)
RDW: 15.8 % — AB (ref 11.5–15.5)
WBC: 22.9 10*3/uL — ABNORMAL HIGH (ref 4.0–10.5)

## 2018-01-10 LAB — BASIC METABOLIC PANEL
Anion gap: 4 — ABNORMAL LOW (ref 5–15)
BUN: 16 mg/dL (ref 6–20)
CALCIUM: 8.1 mg/dL — AB (ref 8.9–10.3)
CO2: 29 mmol/L (ref 22–32)
CREATININE: 1 mg/dL (ref 0.61–1.24)
Chloride: 106 mmol/L (ref 101–111)
GFR calc Af Amer: >60 mL/min (ref >60)
Glucose, Bld: 94 mg/dL (ref 65–99)
POTASSIUM: 3.7 mmol/L (ref 3.5–5.1)
Sodium: 139 mmol/L (ref 135–145)

## 2018-01-10 MED ORDER — AMOXICILLIN-POT CLAVULANATE 875-125 MG PO TABS
1.0000 | ORAL_TABLET | Freq: Two times a day (BID) | ORAL | Status: DC
  Administered 2018-01-10 – 2018-01-12 (×5): 1 via ORAL
  Filled 2018-01-10 (×6): qty 1

## 2018-01-10 MED ORDER — FUROSEMIDE 10 MG/ML IJ SOLN
20.0000 mg | Freq: Once | INTRAMUSCULAR | Status: AC
  Administered 2018-01-10: 20 mg via INTRAVENOUS
  Filled 2018-01-10 (×2): qty 2

## 2018-01-10 NOTE — Progress Notes (Signed)
[facility]/Trauma Progress Note  5 Days Post-Op   Assessment/Plan Sepsis secondary to Necrotizing fasciitis of perianal region, s/p debridement of necrotic tissue, Dr. Ninfa Linden, 01-02-18, Dr. Grandville Silos 01-04-18, 01-05-18 -Wet toDrydressing changes to wound along with hydrotherapy. Paper tape, monitor rash on bottom -SNF for wound care at discharge. no help at home and this wound is not in a place where he can change it himself.Social worker consult pending First CX e coli and actinomyces, Next culture e coli Plan Zosyn until 6/16 then Augmentin PO for 7d. - doxy for tick  Sinus Arrhythmia-stable Recent UTI ARI-2.14 on admit, down to 1.06today HTN -had hypotension on admit, but now hypertensive again that sepsis has resolved/improved. -appreciate medicine's assistance with this. Hyperkalemia/hypomagnesemia -K is3.7.PO replacement Morbid obesity-lost 95# after gastric bypass60months ago at [facility] SOB- O2 sats 98% on RA - pt has edema likely from 3rd spacing, medicine gave lasix - chest xray 06/14 neg for any acute abnormalities, breathing improved   FEN - regular diet, multivitamin, b12 VTE -SCDs/Lovenox ID -zosyn stopped 06/16,doxy, Augmentin 06/16-06/23 WBC still elevated at 22.9, AM CBC Follow up: TBD  Dispo: continue wet to dry dressing changes and hydrotherapy.SNF placement pending. Am labs.     LOS: 8 days    Subjective: CC; buttock pain  No nausea or vomiting overnight. Tolerating diet. Less BM's. No issues overnight. Breathing is good no SOB.   Objective: Vital signs in last 24 hours: Temp:  [98.2 F (36.8 C)-98.8 F (37.1 C)] 98.2 F (36.8 C) (06/16 0502) Pulse Rate:  [68-78] 68 (06/16 0502) Resp:  [18] 18 (06/16 0502) BP: (125-147)/(53-89) 126/68 (06/16 0502) SpO2:  [94 %-98 %] 97 % (06/16 0502) Last BM Date: 01/09/18  Intake/Output from previous day: 06/15 0701 - 06/16 0700 In: 540 [P.O.:540] Out: 1150  [Urine:1150] Intake/Output this shift: No intake/output data recorded.  PE: Gen: Alert, NAD, pleasant, cooperative Cardio: RRR Pulm:CTA b/l, rate andeffort normal, no wheezes or rales appreciated Extremities:b/l hand and feet edema GU: wound C/D/I Skin: warm and dry   Anti-infectives: Anti-infectives (From admission, onward)   Start     Dose/Rate Route Frequency Ordered Stop   01/06/18 2300  piperacillin-tazobactam (ZOSYN) IVPB 3.375 g  Status:  Discontinued     3.375 g 12.5 mL/hr over 240 Minutes Intravenous Every 8 hours 01/06/18 1540 01/06/18 1936   01/06/18 2100  piperacillin-tazobactam (ZOSYN) IVPB 3.375 g     3.375 g 12.5 mL/hr over 240 Minutes Intravenous Every 8 hours 01/06/18 1936     01/06/18 1600  piperacillin-tazobactam (ZOSYN) IVPB 3.375 g  Status:  Discontinued     3.375 g 100 mL/hr over 30 Minutes Intravenous  Once 01/06/18 1545 01/06/18 1935   01/04/18 2200  ceFEPIme (MAXIPIME) 2 g in sodium chloride 0.9 % 100 mL IVPB  Status:  Discontinued    Note to Pharmacy:  Pharm to dose   2 g 200 mL/hr over 30 Minutes Intravenous Every 12 hours 01/04/18 1428 01/06/18 1537   01/04/18 1600  clindamycin (CLEOCIN) IVPB 600 mg  Status:  Discontinued     600 mg 100 mL/hr over 30 Minutes Intravenous Every 8 hours 01/04/18 1505 01/05/18 1145   01/04/18 1515  doxycycline (VIBRA-TABS) tablet 100 mg     100 mg Oral Every 12 hours 01/04/18 1505     01/04/18 1145  ceFEPIme (MAXIPIME) 2 g in sodium chloride 0.9 % 100 mL IVPB    Note to Pharmacy:  Pharm to dose   2 g 200 mL/hr over  30 Minutes Intravenous To Surgery 01/04/18 1131 01/05/18 1143   01/04/18 1145  metronidazole (FLAGYL) IVPB 500 mg  Status:  Discontinued     500 mg 100 mL/hr over 60 Minutes Intravenous To Surgery 01/04/18 1131 01/04/18 1412   01/03/18 1000  metroNIDAZOLE (FLAGYL) IVPB 500 mg  Status:  Discontinued     500 mg 100 mL/hr over 60 Minutes Intravenous Every 8 hours 01/03/18 0956 01/06/18 1537   01/02/18  1000  ceFEPIme (MAXIPIME) 2 g in sodium chloride 0.9 % 100 mL IVPB  Status:  Discontinued    Note to Pharmacy:  Pharm to dose   2 g 200 mL/hr over 30 Minutes Intravenous Every 12 hours 01/02/18 0340 01/04/18 1131   01/02/18 0600  vancomycin (VANCOCIN) IVPB 1000 mg/200 mL premix  Status:  Discontinued     1,000 mg 200 mL/hr over 60 Minutes Intravenous Every 12 hours 01/02/18 0409 01/05/18 1102   01/01/18 2330  ceFEPIme (MAXIPIME) 2 g in sodium chloride 0.9 % 100 mL IVPB     2 g 200 mL/hr over 30 Minutes Intravenous  Once 01/01/18 2321 01/02/18 0038   01/01/18 2315  vancomycin (VANCOCIN) 2,000 mg in sodium chloride 0.9 % 500 mL IVPB     2,000 mg 250 mL/hr over 120 Minutes Intravenous  Once 01/01/18 2313 01/05/18 1144   01/01/18 1945  cefTRIAXone (ROCEPHIN) 1 g in sodium chloride 0.9 % 100 mL IVPB     1 g 200 mL/hr over 30 Minutes Intravenous  Once 01/01/18 1932 01/01/18 2202   01/01/18 1945  metroNIDAZOLE (FLAGYL) IVPB 500 mg     500 mg 100 mL/hr over 60 Minutes Intravenous  Once 01/01/18 1932 01/01/18 2317      Lab Results:  Recent Labs    01/08/18 0500 01/10/18 0555  WBC 21.5* 22.9*  HGB 11.4* 10.7*  HCT 33.2* 31.6*  PLT 417* 378   BMET Recent Labs    01/09/18 0530 01/10/18 0555  NA 140 139  K 3.5 3.7  CL 107 106  CO2 29 29  GLUCOSE 94 94  BUN 17 16  CREATININE 1.06 1.00  CALCIUM 8.3* 8.1*   PT/INR No results for input(s): LABPROT, INR in the last 72 hours. CMP     Component Value Date/Time   NA 139 01/10/2018 0555   K 3.7 01/10/2018 0555   CL 106 01/10/2018 0555   CO2 29 01/10/2018 0555   GLUCOSE 94 01/10/2018 0555   BUN 16 01/10/2018 0555   CREATININE 1.00 01/10/2018 0555   CREATININE 0.85 08/30/2013 1104   CALCIUM 8.1 (L) 01/10/2018 0555   PROT 5.0 (L) 01/09/2018 0530   ALBUMIN 2.0 (L) 01/09/2018 0530   AST 16 01/09/2018 0530   ALT 15 (L) 01/09/2018 0530   ALKPHOS 50 01/09/2018 0530   BILITOT 0.7 01/09/2018 0530   GFRNONAA >60 01/10/2018 0555    GFRAA >60 01/10/2018 0555   Lipase     Component Value Date/Time   LIPASE 42 02/27/2012 0811    Studies/Results: Dg Chest Port 1 View  Result Date: 01/08/2018 CLINICAL DATA:  Shortness of breath EXAM: PORTABLE CHEST 1 VIEW COMPARISON:  February 27, 2012 FINDINGS: Central catheter tip is in the superior vena cava. No pneumothorax. No edema or consolidation. Heart is mildly enlarged with pulmonary vascularity normal. No adenopathy. No bone lesions. IMPRESSION: Central catheter tip in superior vena cava. No pneumothorax. No edema or consolidation. Stable cardiac prominence. Electronically Signed   By: Lowella Grip III M.D.  On: 01/08/2018 11:56      Kalman Drape , [facility] 01/10/2018, 9:36 AM  Pager: [phone] Mon-Wed, Friday 7:00am-4:30pm Thurs 7am-11:30am  Consults: [phone]

## 2018-01-10 NOTE — Progress Notes (Signed)
PROGRESS NOTE  Brandon Marks HAL:[id number] DOB: 01/16/1956 DOA: 01/01/2018 PCP: Tamsen Roers, MD   LOS: 8 days   Brief Narrative / Interim history: 62 year old male with history of morbid obesity status post gastric bypass, hypertension, sleep apnea on CPAP who presented with perianal pain, found to have perirectal abscess with deep necrotizing fasciitis.  He was admitted to general surgery service and we were asked to consult for acute kidney injury and hypertension.  Assessment & Plan: Active Problems:   Necrotizing fasciitis ([place])   Acute kidney injury -Likely prerenal in the setting of sepsis, he received IV fluids antibiotics.  Kidney function has normalized -Now with fluid overload in the setting of sepsis, has received IV Lasix on 6/14, 6/15, will repeat dose today.  Good urine output per patient,  Sepsis due to necrotizing fasciitis -Management per primary team, patient underwent I&D on 6/8, 6/10, 6/11 -Defer antibiotics to surgical team -Wound microbiology with moderate E. coli which is pansensitive, also with gram-positive rods, gram-negative rods, actinomyces, final pending -Continue hydrotherapy, blood cultures have remained negative  Hypertension -Resume lisinopril and HCTZ on 6/12, HCTZ discontinued on 6/14 -Controlled today, continue IV Lasix as described above  Sinus arrhythmia -Occasional PACs and PVCs, doubt clinically significant  Fluid overload -Improving, continue IV Lasix, he has several unmeasured urine outputs so unable to quantify adequately the I&Os -Repeat IV Lasix today, add TED hoses  Diarrhea -Likely in the setting of antibiotics, C. difficile was negative   DVT prophylaxis: Lovenox Code Status: Full code Family Communication: No family present at bedside Disposition Plan: SNF pending next week  Procedures:   I&D 6/8, 6/10, 6/11  Antimicrobials:  Augmentin  Doxycycline  Subjective: -Feels better, less pain, denies any shortness  of breath  Objective: Vitals:   01/09/18 1616 01/09/18 2036 01/10/18 0004 01/10/18 0502  BP: (!) 147/63 139/89 (!) 129/53 126/68  Pulse: 71 73 73 68  Resp: 18 18 18 18   Temp: 98.8 F (37.1 C) 98.8 F (37.1 C) 98.6 F (37 C) 98.2 F (36.8 C)  TempSrc: Oral Oral Oral Axillary  SpO2: 95% 98% 96% 97%  Weight:      Height:        Intake/Output Summary (Last 24 hours) at 01/10/2018 1158 Last data filed at 01/10/2018 0230 Gross per 24 hour  Intake 340 ml  Output 1150 ml  Net -810 ml   Filed Weights   01/02/18 0351 01/02/18 0634 01/05/18 1139  Weight: (!) 147 kg (324 lb 1.2 oz) (!) 147 kg (324 lb 1.2 oz) (!) 155.5 kg (342 lb 13 oz)    Examination:  Constitutional: NAD Respiratory: CTA Cardiovascular: RRR  Data Reviewed: I have independently reviewed following labs and imaging studies   CBC: Recent Labs  Lab 01/04/18 0310 01/06/18 1107 01/07/18 0333 01/08/18 0500 01/10/18 0555  WBC 22.3* 22.7* 21.1* 21.5* 22.9*  HGB 12.4* 12.5* 11.8* 11.4* 10.7*  HCT 35.7* 36.9* 34.3* 33.2* 31.6*  MCV 85.0 86.6 86.2 85.6 85.6  PLT 372 432* 423* 417* 735   Basic Metabolic Panel: Recent Labs  Lab 01/04/18 0310 01/06/18 1107 01/07/18 0333 01/08/18 0500 01/09/18 0530 01/10/18 0555  NA 138 140 140 139 140 139  K 3.6 3.6 3.4* 3.2* 3.5 3.7  CL 107 109 107 107 107 106  CO2 24 24 24 26 29 29   GLUCOSE 99 95 86 83 94 94  BUN 29* 30* 24* 19 17 16   CREATININE 1.28* 1.17 1.11 1.04 1.06 1.00  CALCIUM 8.4* 8.4* 8.4*  8.2* 8.3* 8.1*  MG 1.6*  --   --   --   --   --    GFR: Estimated Creatinine Clearance: 118.6 mL/min (by C-G formula based on SCr of 1 mg/dL). Liver Function Tests: Recent Labs  Lab 01/09/18 0530  AST 16  ALT 15*  ALKPHOS 50  BILITOT 0.7  PROT 5.0*  ALBUMIN 2.0*   No results for input(s): LIPASE, AMYLASE in the last 168 hours. No results for input(s): AMMONIA in the last 168 hours. Coagulation Profile: No results for input(s): INR, PROTIME in the last 168  hours. Cardiac Enzymes: No results for input(s): CKTOTAL, CKMB, CKMBINDEX, TROPONINI in the last 168 hours. BNP (last 3 results) No results for input(s): PROBNP in the last 8760 hours. HbA1C: No results for input(s): HGBA1C in the last 72 hours. CBG: No results for input(s): GLUCAP in the last 168 hours. Lipid Profile: No results for input(s): CHOL, HDL, LDLCALC, TRIG, CHOLHDL, LDLDIRECT in the last 72 hours. Thyroid Function Tests: No results for input(s): TSH, T4TOTAL, FREET4, T3FREE, THYROIDAB in the last 72 hours. Anemia Panel: No results for input(s): VITAMINB12, FOLATE, FERRITIN, TIBC, IRON, RETICCTPCT in the last 72 hours. Urine analysis:    Component Value Date/Time   COLORURINE AMBER (A) 01/02/2018 1123   APPEARANCEUR HAZY (A) 01/02/2018 1123   LABSPEC 1.019 01/02/2018 1123   PHURINE 5.0 01/02/2018 1123   GLUCOSEU NEGATIVE 01/02/2018 1123   HGBUR SMALL (A) 01/02/2018 1123   BILIRUBINUR NEGATIVE 01/02/2018 1123   KETONESUR NEGATIVE 01/02/2018 1123   PROTEINUR NEGATIVE 01/02/2018 1123   UROBILINOGEN 0.2 03/29/2017 1643   NITRITE NEGATIVE 01/02/2018 1123   LEUKOCYTESUR SMALL (A) 01/02/2018 1123   Sepsis Labs: Invalid input(s): PROCALCITONIN, LACTICIDVEN  Recent Results (from the past 240 hour(s))  Blood culture (routine x 2)     Status: None   Collection Time: 01/01/18  6:35 PM  Result Value Ref Range Status   Specimen Description BLOOD RIGHT ANTECUBITAL  Final   Special Requests   Final    BOTTLES DRAWN AEROBIC AND ANAEROBIC Blood Culture adequate volume   Culture   Final    NO GROWTH 5 DAYS Performed at [facility], [address] [place], [place] [address]    Report Status 01/06/2018 FINAL  Final  Blood culture (routine x 2)     Status: None   Collection Time: 01/01/18  6:35 PM  Result Value Ref Range Status   Specimen Description BLOOD LEFT ANTECUBITAL  Final   Special Requests   Final    BOTTLES DRAWN AEROBIC AND ANAEROBIC Blood Culture adequate  volume   Culture   Final    NO GROWTH 5 DAYS Performed at [facility], [place] [address] [place], [place] [address]    Report Status 01/06/2018 FINAL  Final  Fungus Culture With Stain     Status: None (Preliminary result)   Collection Time: 01/02/18  1:50 AM  Result Value Ref Range Status   Fungus Stain Final report  Final    Comment: (NOTE) Performed At: [facility] [place], [place] [id number] Rush Farmer MD [id number]    Fungus (Mycology) Culture PENDING  Incomplete   Fungal Source ABSCESS  Final    Comment: PERIRECTAL  Aerobic/Anaerobic Culture (surgical/deep wound)     Status: None   Collection Time: 01/02/18  1:50 AM  Result Value Ref Range Status   Specimen Description ABSCESS PERIRECTAL  Final   Special Requests NONE  Final  Gram Stain   Final    RARE WBC PRESENT, PREDOMINANTLY PMN MODERATE GRAM POSITIVE COCCI IN PAIRS FEW GRAM POSITIVE RODS FEW GRAM NEGATIVE RODS Performed at [facility], [place] [address], [place], [place] [address]    Culture   Final    MODERATE ESCHERICHIA COLI MODERATE ACTINOMYCES SPECIES Standardized susceptibility testing for this organism is not available. MIXED ANAEROBIC FLORA PRESENT.  CALL LAB IF FURTHER IID REQUIRED.    Report Status 01/07/2018 FINAL  Final   Organism ID, Bacteria ESCHERICHIA COLI  Final      Susceptibility   Escherichia coli - MIC*    AMPICILLIN 8 SENSITIVE Sensitive     CEFAZOLIN <=4 SENSITIVE Sensitive     CEFEPIME <=1 SENSITIVE Sensitive     CEFTAZIDIME <=1 SENSITIVE Sensitive     CEFTRIAXONE <=1 SENSITIVE Sensitive     CIPROFLOXACIN <=0.25 SENSITIVE Sensitive     GENTAMICIN <=1 SENSITIVE Sensitive     IMIPENEM <=0.25 SENSITIVE Sensitive     TRIMETH/SULFA <=20 SENSITIVE Sensitive     AMPICILLIN/SULBACTAM 4 SENSITIVE Sensitive     PIP/TAZO <=4 SENSITIVE Sensitive     Extended ESBL NEGATIVE Sensitive     * MODERATE ESCHERICHIA COLI  Fungus Stain     Status: None    Collection Time: 01/02/18  1:50 AM  Result Value Ref Range Status   FUNGUS STAIN REFERT  Final    Comment: (NOTE) Please refer to the following specimen for additional lab results.      Reference [id number] for Fungus Stain results      that are included in test# [id number] Performed At: [facility] [place], [place] [id number] Rush Farmer MD [id number]    Fungal Source ABSCESS  Final    Comment: PERIRECTAL  Fungus Culture Result     Status: None   Collection Time: 01/02/18  1:50 AM  Result Value Ref Range Status   Result 1 Comment  Final    Comment: (NOTE) KOH/Calcofluor preparation:  no fungus observed. Performed At: [facility] [place], [place] [id number] Rush Farmer MD [id number]   Culture, Urine     Status: None   Collection Time: 01/02/18  9:06 PM  Result Value Ref Range Status   Specimen Description URINE, CLEAN CATCH  Final   Special Requests NONE  Final   Culture   Final    NO GROWTH Performed at [facility], [place] [address] [place], [place] [address]    Report Status 01/04/2018 FINAL  Final  Aerobic/Anaerobic Culture (surgical/deep wound)     Status: None   Collection Time: 01/04/18 12:13 PM  Result Value Ref Range Status   Specimen Description WOUND  Final   Special Requests BUTTOCKS  Final   Gram Stain   Final    RARE WBC PRESENT, PREDOMINANTLY PMN MODERATE GRAM VARIABLE ROD FEW GRAM POSITIVE COCCI IN PAIRS Performed at [facility], [address], [place], [place] [address]    Culture   Final    FEW ESCHERICHIA COLI MIXED ANAEROBIC FLORA PRESENT.  CALL LAB IF FURTHER IID REQUIRED.    Report Status 01/09/2018 FINAL  Final   Organism ID, Bacteria ESCHERICHIA COLI  Final      Susceptibility   Escherichia coli - MIC*    AMPICILLIN 8 SENSITIVE Sensitive     CEFAZOLIN <=4 SENSITIVE Sensitive     CEFEPIME <=1 SENSITIVE Sensitive     CEFTAZIDIME <=1 SENSITIVE Sensitive  CEFTRIAXONE  <=1 SENSITIVE Sensitive     CIPROFLOXACIN <=0.25 SENSITIVE Sensitive     GENTAMICIN <=1 SENSITIVE Sensitive     IMIPENEM <=0.25 SENSITIVE Sensitive     TRIMETH/SULFA <=20 SENSITIVE Sensitive     AMPICILLIN/SULBACTAM 4 SENSITIVE Sensitive     PIP/TAZO <=4 SENSITIVE Sensitive     Extended ESBL NEGATIVE Sensitive     * FEW ESCHERICHIA COLI  C difficile quick scan w PCR reflex     Status: None   Collection Time: 01/08/18 11:08 PM  Result Value Ref Range Status   C Diff antigen NEGATIVE NEGATIVE Final   C Diff toxin NEGATIVE NEGATIVE Final   C Diff interpretation No C. difficile detected.  Final      Radiology Studies: No results found.   Scheduled Meds: . amoxicillin-clavulanate  1 tablet Oral Q12H  . bethanechol  50 mg Oral TID  . Chlorhexidine Gluconate Cloth  6 each Topical Q0600  . docusate sodium  100 mg Oral BID  . doxycycline  100 mg Oral Q12H  . enoxaparin (LOVENOX) injection  80 mg Subcutaneous Q24H  . furosemide  20 mg Intravenous Once  . lisinopril  10 mg Oral Daily  . multivitamin  1 tablet Oral Daily  . polyethylene glycol  17 g Oral Daily  . potassium chloride  40 mEq Oral BID  . sodium chloride flush  10-40 mL Intracatheter Q12H  . tamsulosin  0.4 mg Oral Daily  . vitamin B-12  500 mcg Oral Daily   Continuous Infusions: . lactated ringers 10 mL/hr at 01/05/18 1201  . lactated ringers 10 mL/hr at 01/10/18 0602    Marzetta Board, MD, PhD [facility] Pager [phone]  If 7PM-7AM, please contact night-coverage [email or web address] Password [facility] 01/10/2018, 11:58 AM

## 2018-01-11 LAB — CBC
HCT: 30.5 % — ABNORMAL LOW (ref 39.0–52.0)
HEMOGLOBIN: 10.3 g/dL — AB (ref 13.0–17.0)
MCH: 29.3 pg (ref 26.0–34.0)
MCHC: 33.8 g/dL (ref 30.0–36.0)
MCV: 86.6 fL (ref 78.0–100.0)
Platelets: 353 10*3/uL (ref 150–400)
RBC: 3.52 MIL/uL — AB (ref 4.22–5.81)
RDW: 15.8 % — ABNORMAL HIGH (ref 11.5–15.5)
WBC: 19.5 10*3/uL — ABNORMAL HIGH (ref 4.0–10.5)

## 2018-01-11 LAB — BASIC METABOLIC PANEL
Anion gap: 6 (ref 5–15)
BUN: 15 mg/dL (ref 6–20)
CHLORIDE: 105 mmol/L (ref 101–111)
CO2: 28 mmol/L (ref 22–32)
CREATININE: 0.89 mg/dL (ref 0.61–1.24)
Calcium: 8.1 mg/dL — ABNORMAL LOW (ref 8.9–10.3)
GFR calc Af Amer: >60 mL/min (ref >60)
GFR calc non Af Amer: >60 mL/min (ref >60)
GLUCOSE: 84 mg/dL (ref 65–99)
POTASSIUM: 3.8 mmol/L (ref 3.5–5.1)
Sodium: 139 mmol/L (ref 135–145)

## 2018-01-11 MED ORDER — POLYETHYLENE GLYCOL 3350 17 G PO PACK: 17.0000 g | PACK | Freq: Every day | ORAL | Status: DC | PRN

## 2018-01-11 MED ORDER — FUROSEMIDE 10 MG/ML IJ SOLN
40.0000 mg | Freq: Once | INTRAMUSCULAR | Status: AC
  Administered 2018-01-11: 40 mg via INTRAVENOUS
  Filled 2018-01-11: qty 4

## 2018-01-11 MED ORDER — DOCUSATE SODIUM 100 MG PO CAPS: 100.0000 mg | ORAL_CAPSULE | Freq: Every day | ORAL | Status: DC | PRN

## 2018-01-11 NOTE — Progress Notes (Signed)
PROGRESS NOTE  Brandon Marks HEN:[id number] DOB: 03-23-1956 DOA: 01/01/2018 PCP: Tamsen Roers, MD   LOS: 9 days   Brief Narrative / Interim history: 62 year old male with history of morbid obesity status post gastric bypass, hypertension, sleep apnea on CPAP who presented with perianal pain, found to have perirectal abscess with deep necrotizing fasciitis.  He was admitted to general surgery service and we were asked to consult for acute kidney injury and hypertension.  Assessment & Plan: Active Problems:   Necrotizing fasciitis ([place])   Acute kidney injury -Likely prerenal in the setting of sepsis, he received IV fluids antibiotics.  Kidney function has normalized -Now with fluid overload in the setting of sepsis, has received IV Lasix on 6/14, 6/15, 6/16, will repeat dose today  Sepsis due to necrotizing fasciitis -Management per primary team, patient underwent I&D on 6/8, 6/10, 6/11 -Defer antibiotics to surgical team -Wound microbiology with moderate E. coli which is pansensitive, also with gram-positive rods, gram-negative rods, actinomyces, final pending -Continue hydrotherapy, blood cultures have remained negative  Hypertension -Resume lisinopril and HCTZ on 6/12, HCTZ discontinued on 6/14 -Controlled today, continue IV Lasix as described above, blood pressure stable  Sinus arrhythmia -Occasional PACs and PVCs, doubt clinically significant  Fluid overload -Continue IV Lasix today, can transition to hydrochlorothiazide tomorrow, his swelling has significantly improved  Diarrhea -Likely in the setting of antibiotics, C. difficile was negative -Diarrhea resolved today   DVT prophylaxis: Lovenox Code Status: Full code Family Communication: No family present at bedside Disposition Plan: SNF pending likely tomorrow per general surgery  Procedures:   I&D 6/8, 6/10, 6/11  Antimicrobials:  Augmentin  Doxycycline  Subjective: -Does not have much pain, his swelling  is improving  Objective: Vitals:   01/10/18 2223 01/11/18 0340 01/11/18 0835 01/11/18 0919  BP: (!) 146/63 (!) 144/64  (!) 147/95  Pulse: 72 63  74  Resp: 18 15  18   Temp: 99.5 F (37.5 C) 98.8 F (37.1 C) 98.9 F (37.2 C)   TempSrc: Oral Oral Oral   SpO2: 97% 96%  97%  Weight:      Height:        Intake/Output Summary (Last 24 hours) at 01/11/2018 1205 Last data filed at 01/10/2018 1800 Gross per 24 hour  Intake 390 ml  Output 1301 ml  Net -911 ml   Filed Weights   01/02/18 0351 01/02/18 0634 01/05/18 1139  Weight: (!) 147 kg (324 lb 1.2 oz) (!) 147 kg (324 lb 1.2 oz) (!) 155.5 kg (342 lb 13 oz)    Examination:  Constitutional: NAD, calm, comfortable Eyes: lids and conjunctivae normal ENMT: Mucous membranes are moist.  Respiratory: clear to auscultation bilaterally, no wheezing, no crackles. Normal respiratory effort.  Cardiovascular: Regular rate and rhythm, no murmurs / rubs / gallops. Trace LE edema. 2+ pedal pulses.  Abdomen: no tenderness. Bowel sounds positive.  Skin: no rashes   Data Reviewed: I have independently reviewed following labs and imaging studies   CBC: Recent Labs  Lab 01/06/18 1107 01/07/18 0333 01/08/18 0500 01/10/18 0555 01/11/18 0500  WBC 22.7* 21.1* 21.5* 22.9* 19.5*  HGB 12.5* 11.8* 11.4* 10.7* 10.3*  HCT 36.9* 34.3* 33.2* 31.6* 30.5*  MCV 86.6 86.2 85.6 85.6 86.6  PLT 432* 423* 417* 378 361   Basic Metabolic Panel: Recent Labs  Lab 01/07/18 0333 01/08/18 0500 01/09/18 0530 01/10/18 0555 01/11/18 0500  NA 140 139 140 139 139  K 3.4* 3.2* 3.5 3.7 3.8  CL 107 107 107 106  105  CO2 24 26 29 29 28   GLUCOSE 86 83 94 94 84  BUN 24* 19 17 16 15   CREATININE 1.11 1.04 1.06 1.00 0.89  CALCIUM 8.4* 8.2* 8.3* 8.1* 8.1*   GFR: Estimated Creatinine Clearance: 133.3 mL/min (by C-G formula based on SCr of 0.89 mg/dL). Liver Function Tests: Recent Labs  Lab 01/09/18 0530  AST 16  ALT 15*  ALKPHOS 50  BILITOT 0.7  PROT 5.0*    ALBUMIN 2.0*   No results for input(s): LIPASE, AMYLASE in the last 168 hours. No results for input(s): AMMONIA in the last 168 hours. Coagulation Profile: No results for input(s): INR, PROTIME in the last 168 hours. Cardiac Enzymes: No results for input(s): CKTOTAL, CKMB, CKMBINDEX, TROPONINI in the last 168 hours. BNP (last 3 results) No results for input(s): PROBNP in the last 8760 hours. HbA1C: No results for input(s): HGBA1C in the last 72 hours. CBG: No results for input(s): GLUCAP in the last 168 hours. Lipid Profile: No results for input(s): CHOL, HDL, LDLCALC, TRIG, CHOLHDL, LDLDIRECT in the last 72 hours. Thyroid Function Tests: No results for input(s): TSH, T4TOTAL, FREET4, T3FREE, THYROIDAB in the last 72 hours. Anemia Panel: No results for input(s): VITAMINB12, FOLATE, FERRITIN, TIBC, IRON, RETICCTPCT in the last 72 hours. Urine analysis:    Component Value Date/Time   COLORURINE AMBER (A) 01/02/2018 1123   APPEARANCEUR HAZY (A) 01/02/2018 1123   LABSPEC 1.019 01/02/2018 1123   PHURINE 5.0 01/02/2018 1123   GLUCOSEU NEGATIVE 01/02/2018 1123   HGBUR SMALL (A) 01/02/2018 1123   BILIRUBINUR NEGATIVE 01/02/2018 1123   KETONESUR NEGATIVE 01/02/2018 1123   PROTEINUR NEGATIVE 01/02/2018 1123   UROBILINOGEN 0.2 03/29/2017 1643   NITRITE NEGATIVE 01/02/2018 1123   LEUKOCYTESUR SMALL (A) 01/02/2018 1123   Sepsis Labs: Invalid input(s): PROCALCITONIN, LACTICIDVEN  Recent Results (from the past 240 hour(s))  Blood culture (routine x 2)     Status: None   Collection Time: 01/01/18  6:35 PM  Result Value Ref Range Status   Specimen Description BLOOD RIGHT ANTECUBITAL  Final   Special Requests   Final    BOTTLES DRAWN AEROBIC AND ANAEROBIC Blood Culture adequate volume   Culture   Final    NO GROWTH 5 DAYS Performed at [facility], [address], [place], [place] [address]    Report Status 01/06/2018 FINAL  Final  Blood culture (routine x 2)     Status: None    Collection Time: 01/01/18  6:35 PM  Result Value Ref Range Status   Specimen Description BLOOD LEFT ANTECUBITAL  Final   Special Requests   Final    BOTTLES DRAWN AEROBIC AND ANAEROBIC Blood Culture adequate volume   Culture   Final    NO GROWTH 5 DAYS Performed at [facility], [place] [address], [place], [place] [address]    Report Status 01/06/2018 FINAL  Final  Fungus Culture With Stain     Status: None (Preliminary result)   Collection Time: 01/02/18  1:50 AM  Result Value Ref Range Status   Fungus Stain Final report  Final    Comment: (NOTE) Performed At: [facility] [place], [place] [id number] Rush Farmer MD [id number]    Fungus (Mycology) Culture PENDING  Incomplete   Fungal Source ABSCESS  Final    Comment: PERIRECTAL  Aerobic/Anaerobic Culture (surgical/deep wound)     Status: None   Collection Time: 01/02/18  1:50 AM  Result Value Ref Range Status  Specimen Description ABSCESS PERIRECTAL  Final   Special Requests NONE  Final   Gram Stain   Final    RARE WBC PRESENT, PREDOMINANTLY PMN MODERATE GRAM POSITIVE COCCI IN PAIRS FEW GRAM POSITIVE RODS FEW GRAM NEGATIVE RODS Performed at [facility], [place] [address], [place], [place] [address]    Culture   Final    MODERATE ESCHERICHIA COLI MODERATE ACTINOMYCES SPECIES Standardized susceptibility testing for this organism is not available. MIXED ANAEROBIC FLORA PRESENT.  CALL LAB IF FURTHER IID REQUIRED.    Report Status 01/07/2018 FINAL  Final   Organism ID, Bacteria ESCHERICHIA COLI  Final      Susceptibility   Escherichia coli - MIC*    AMPICILLIN 8 SENSITIVE Sensitive     CEFAZOLIN <=4 SENSITIVE Sensitive     CEFEPIME <=1 SENSITIVE Sensitive     CEFTAZIDIME <=1 SENSITIVE Sensitive     CEFTRIAXONE <=1 SENSITIVE Sensitive     CIPROFLOXACIN <=0.25 SENSITIVE Sensitive     GENTAMICIN <=1 SENSITIVE Sensitive     IMIPENEM <=0.25 SENSITIVE Sensitive     TRIMETH/SULFA <=20  SENSITIVE Sensitive     AMPICILLIN/SULBACTAM 4 SENSITIVE Sensitive     PIP/TAZO <=4 SENSITIVE Sensitive     Extended ESBL NEGATIVE Sensitive     * MODERATE ESCHERICHIA COLI  Fungus Stain     Status: None   Collection Time: 01/02/18  1:50 AM  Result Value Ref Range Status   FUNGUS STAIN REFERT  Final    Comment: (NOTE) Please refer to the following specimen for additional lab results.      Reference [id number] for Fungus Stain results      that are included in test# [id number] Performed At: [facility] [place], [place] [id number] Rush Farmer MD [id number]    Fungal Source ABSCESS  Final    Comment: PERIRECTAL  Fungus Culture Result     Status: None   Collection Time: 01/02/18  1:50 AM  Result Value Ref Range Status   Result 1 Comment  Final    Comment: (NOTE) KOH/Calcofluor preparation:  no fungus observed. Performed At: [facility] [place], [place] [id number] Rush Farmer MD [id number]   Culture, Urine     Status: None   Collection Time: 01/02/18  9:06 PM  Result Value Ref Range Status   Specimen Description URINE, CLEAN CATCH  Final   Special Requests NONE  Final   Culture   Final    NO GROWTH Performed at [facility], [place] [address] [place], [place] [address]    Report Status 01/04/2018 FINAL  Final  Aerobic/Anaerobic Culture (surgical/deep wound)     Status: None   Collection Time: 01/04/18 12:13 PM  Result Value Ref Range Status   Specimen Description WOUND  Final   Special Requests BUTTOCKS  Final   Gram Stain   Final    RARE WBC PRESENT, PREDOMINANTLY PMN MODERATE GRAM VARIABLE ROD FEW GRAM POSITIVE COCCI IN PAIRS Performed at [facility], [address], [place],  [address]    Culture   Final    FEW ESCHERICHIA COLI MIXED ANAEROBIC FLORA PRESENT.  CALL LAB IF FURTHER IID REQUIRED.    Report Status 01/09/2018 FINAL  Final   Organism ID, Bacteria ESCHERICHIA COLI  Final       Susceptibility   Escherichia coli - MIC*    AMPICILLIN 8 SENSITIVE Sensitive     CEFAZOLIN <=4 SENSITIVE Sensitive  CEFEPIME <=1 SENSITIVE Sensitive     CEFTAZIDIME <=1 SENSITIVE Sensitive     CEFTRIAXONE <=1 SENSITIVE Sensitive     CIPROFLOXACIN <=0.25 SENSITIVE Sensitive     GENTAMICIN <=1 SENSITIVE Sensitive     IMIPENEM <=0.25 SENSITIVE Sensitive     TRIMETH/SULFA <=20 SENSITIVE Sensitive     AMPICILLIN/SULBACTAM 4 SENSITIVE Sensitive     PIP/TAZO <=4 SENSITIVE Sensitive     Extended ESBL NEGATIVE Sensitive     * FEW ESCHERICHIA COLI  C difficile quick scan w PCR reflex     Status: None   Collection Time: 01/08/18 11:08 PM  Result Value Ref Range Status   C Diff antigen NEGATIVE NEGATIVE Final   C Diff toxin NEGATIVE NEGATIVE Final   C Diff interpretation No C. difficile detected.  Final      Radiology Studies: No results found.   Scheduled Meds: . amoxicillin-clavulanate  1 tablet Oral Q12H  . bethanechol  50 mg Oral TID  . Chlorhexidine Gluconate Cloth  6 each Topical Q0600  . doxycycline  100 mg Oral Q12H  . enoxaparin (LOVENOX) injection  80 mg Subcutaneous Q24H  . furosemide  40 mg Intravenous Once  . lisinopril  10 mg Oral Daily  . multivitamin  1 tablet Oral Daily  . potassium chloride  40 mEq Oral BID  . sodium chloride flush  10-40 mL Intracatheter Q12H  . tamsulosin  0.4 mg Oral Daily  . vitamin B-12  500 mcg Oral Daily   Continuous Infusions: . lactated ringers 10 mL/hr at 01/05/18 1201  . lactated ringers 10 mL/hr at 01/10/18 0602    Marzetta Board, MD, PhD [facility] Pager [phone]  If 7PM-7AM, please contact night-coverage [email or web address] Password [facility] 01/11/2018, 12:05 PM

## 2018-01-11 NOTE — Progress Notes (Signed)
Physical Therapy Treatment Patient Details Name: Brandon Marks MRN: [id number] DOB: [birth year] Today's Date: 01/11/2018    History of Present Illness pt is a 62 y/o male with 1 week h/o perianal pain.  CT showing a necrotizing perirectal abscess.  Pt s/p I and D x2.  PMH:  HTN, gastic bypass surgery.    PT Comments    Pt more talkative and participative.  He was agreeable to getting up today.   Emphasis on gait quality, upright posture and increasing distance.  Follow Up Recommendations  Home health PT;Other (comment)     Equipment Recommendations  Rolling walker with 5" wheels    Recommendations for Other Services       Precautions / Restrictions Precautions Precautions: Fall    Mobility  Bed Mobility Overal bed mobility: Modified Independent                Transfers Overall transfer level: Needs assistance   Transfers: Sit to/from Stand Sit to Stand: Min guard         General transfer comment: cues to push up off the bed  Ambulation/Gait Ambulation/Gait assistance: Min guard Gait Distance (Feet): 280 Feet Assistive device: Rolling walker (2 wheeled) Gait Pattern/deviations: Step-through pattern Gait velocity: moderate with RW Gait velocity interpretation: 1.31 - 2.62 ft/sec, indicative of limited community ambulator General Gait Details:  cued to stay up in the RW and stand upright more.  Pt notably weak with moderate use of the RW overall.  Last 15 feet min guard without AD and much more unsteady.   Stairs             Wheelchair Mobility    Modified Rankin (Stroke Patients Only)       Balance Overall balance assessment: Needs assistance Sitting-balance support: No upper extremity supported;Feet supported Sitting balance-Leahy Scale: Good       Standing balance-Leahy Scale: Fair Standing balance comment: still mostly reliant on the RW, but showed that he can stand and move a short distance without the RW though he is unsteady.                             Cognition Arousal/Alertness: Awake/alert Behavior During Therapy: WFL for tasks assessed/performed Overall Cognitive Status: Within Functional Limits for tasks assessed                                        Exercises Other Exercises Other Exercises: LE Hamstring stretches bil--contract/relax/hold.    General Comments        Pertinent Vitals/Pain Pain Location: Perirectal Pain Descriptors / Indicators: Discomfort    Home Living                      Prior Function            PT Goals (current goals can now be found in the care plan section) Acute Rehab PT Goals Patient Stated Goal: be able to care for my Mom and aunt. PT Goal Formulation: With patient Time For Goal Achievement: 01/20/18 Potential to Achieve Goals: Good Progress towards PT goals: Progressing toward goals    Frequency    Min 3X/week      PT Plan Current plan remains appropriate    Co-evaluation              AM-PAC PT "6 Clicks" Daily  Activity  Outcome Measure  Difficulty turning over in bed (including adjusting bedclothes, sheets and blankets)?: None Difficulty moving from lying on back to sitting on the side of the bed? : None Difficulty sitting down on and standing up from a chair with arms (e.g., wheelchair, bedside commode, etc,.)?: A Little Help needed moving to and from a bed to chair (including a wheelchair)?: A Little Help needed walking in hospital room?: A Little Help needed climbing 3-5 steps with a railing? : A Little 6 Click Score: 20    End of Session   Activity Tolerance: Patient tolerated treatment well;Other (comment) Patient left: with nursing/sitter in room;Other (comment);with family/visitor present Nurse Communication: Mobility status PT Visit Diagnosis: Other abnormalities of gait and mobility (R26.89);Pain     Time: 1230-1249 PT Time Calculation (min) (ACUTE ONLY): 19 min  Charges:  $Gait  Training: 8-22 mins                    G Codes:       01/16/2018  Donnella Sham, PT [phone]  (pager)   Tessie Fass Matthewjames Petrasek 2018/01/16, 1:05 PM

## 2018-01-11 NOTE — Progress Notes (Addendum)
Physical Therapy Wound Treatment Patient Details  Name: Brandon Marks MRN: [id number] Date of Birth: June 22, 1956  Today's Date: 01/11/2018 Time: 7616-0737 Time Calculation (min): 54 min  Subjective  Subjective: Pt anxious about going to SNF. Patient and Family Stated Goals: Heal wound Prior Treatments: I&D x3  Pain Score: Pain Score: 3   Wound Assessment  Wound / Incision (Open or Dehisced) 01/07/18 Incision - Open Rectum (Active)  Dressing Type ABD;Gauze (Comment);Barrier Film (skin prep);Moist to dry;Paper tape 01/11/2018  5:00 PM  Dressing Changed Changed 01/11/2018  1:00 PM  Dressing Status Clean;Dry;Intact 01/11/2018  5:00 PM  Dressing Change Frequency Daily 01/11/2018  5:00 PM  Site / Wound Assessment Pink;Yellow;Brown 01/11/2018  5:00 PM  % Wound base Red or Granulating 90% 01/11/2018  5:00 PM  % Wound base Yellow/Fibrinous Exudate 10% 01/11/2018  5:00 PM  % Wound base Black/Eschar 0% 01/11/2018  5:00 PM  % Wound base Other/Granulation Tissue (Comment) 0% 01/11/2018  5:00 PM  Peri-wound Assessment Erythema (blanchable) 01/11/2018  5:00 PM  Wound Length (cm) 7 cm 01/07/2018  3:51 PM  Wound Width (cm) 10.1 cm 01/07/2018  3:51 PM  Wound Depth (cm) 9 cm 01/07/2018  3:51 PM  Wound Volume (cm^3) 636.3 cm^3 01/07/2018  3:51 PM  Wound Surface Area (cm^2) 70.7 cm^2 01/07/2018  3:51 PM  Tunneling (cm) 6.5 cm at 9:00, 8.5 cm at 1:00 01/07/2018  3:51 PM  Margins Unattached edges (unapproximated) 01/11/2018  5:00 PM  Closure None 01/11/2018  5:00 PM  Drainage Amount Minimal 01/11/2018  5:00 PM  Drainage Description Serosanguineous 01/11/2018  5:00 PM  Treatment Debridement (Selective);Hydrotherapy (Pulse lavage);Packing (Saline gauze) 01/11/2018  5:00 PM   Hydrotherapy Pulsed lavage therapy - wound location: perirectal wound Pulsed Lavage with Suction (psi): 8 psi Pulsed Lavage with Suction - Normal Saline Used: 1000 mL Pulsed Lavage Tip: Tip with splash shield Selective Debridement Selective  Debridement - Location: perirectal wound Selective Debridement - Tools Used: Forceps;Scissors Selective Debridement - Tissue Removed: yellow and brown necrotic tissue   Wound Assessment and Plan  Wound Therapy - Assess/Plan/Recommendations Wound Therapy - Clinical Statement: Pt's wound with significant decr in necrotic tissue since beginning hydrotherapy. Ready for dc to SNF from hydro standpoint Wound Therapy - Functional Problem List: Decreased tolerance for OOB and positioning Factors Delaying/Impairing Wound Healing: Infection - systemic/local Hydrotherapy Plan: Debridement;Dressing change;Patient/family education;Pulsatile lavage with suction Wound Therapy - Frequency: 6X / week Wound Therapy - Follow Up Recommendations: Skilled nursing facility Wound Plan: See above  Wound Therapy Goals- Improve the function of patient's integumentary system by progressing the wound(s) through the phases of wound healing (inflammation - proliferation - remodeling) by: Decrease Necrotic Tissue to: 5 Decrease Necrotic Tissue - Progress: Updated due to goal met Increase Granulation Tissue to: 95 Increase Granulation Tissue - Progress: Updated due to goal met  Goals will be updated until maximal potential achieved or discharge criteria met.  Discharge criteria: when goals achieved, discharge from hospital, MD decision/surgical intervention, no progress towards goals, refusal/missing three consecutive treatments without notification or medical reason.  GP     Brandon Marks 01/11/2018, 5:57 PM [employer] PT [phone]

## 2018-01-11 NOTE — Progress Notes (Signed)
Picture of wound from today 01-11-18    Wound has cleaned up and is stable to be DC to SNF when bed available. SW updated.  Henreitta Cea  2:23 PM  01/11/2018

## 2018-01-11 NOTE — Evaluation (Signed)
Occupational Therapy Evaluation Patient Details Name: Brandon Marks MRN: [id number] DOB: [birth year] Today's Date: 01/11/2018    History of Present Illness pt is a 62 y/o male with 1 week h/o perianal pain.  CT showing a necrotizing perirectal abscess.  Pt s/p I and D x2.  PMH:  HTN, gastic bypass surgery.   Clinical Impression   Pt presents to OT with above listed diagnosis and below listed deficits.  He requires set up for UB ADLs and mod A for LB ADLs and peri care.  He lives with his mother, for whom he is primary caregiver, and was mod I with ADLs PTA.  Recommend SNF level rehab at discharge.  He was provided with theraband for UB strengthening and all further OT needs can be addressed at SNF.  Acute OT will sign off at this time.    Follow Up Recommendations  SNF    Equipment Recommendations  None recommended by OT    Recommendations for Other Services       Precautions / Restrictions Precautions Precautions: Fall      Mobility Bed Mobility Overal bed mobility: Modified Independent                Transfers Overall transfer level: Needs assistance Equipment used: None Transfers: Sit to/from Stand Sit to Stand: Min guard              Balance Overall balance assessment: Needs assistance Sitting-balance support: No upper extremity supported;Feet supported Sitting balance-Leahy Scale: Good Sitting balance - Comments: worked on leaning forward and reaching toward mid and lower calf in prep for LB ADLs    Standing balance support: No upper extremity supported Standing balance-Leahy Scale: Fair                             ADL either performed or assessed with clinical judgement   ADL Overall ADL's : Needs assistance/impaired Eating/Feeding: Independent   Grooming: Wash/dry hands;Wash/dry face;Oral care;Brushing hair;Set up;Sitting   Upper Body Bathing: Set up;Sitting   Lower Body Bathing: Sit to/from stand;Moderate assistance Lower Body  Bathing Details (indicate cue type and reason): assist for feet, lower legs and peri area  Upper Body Dressing : Set up;Sitting   Lower Body Dressing: Moderate assistance;Sit to/from stand Lower Body Dressing Details (indicate cue type and reason): able to pull pants over feet, total A for socks  Toilet Transfer: Min guard;Comfort height toilet;Grab bars   Toileting- Clothing Manipulation and Hygiene: Moderate assistance;Sit to/from stand Toileting - Clothing Manipulation Details (indicate cue type and reason): for thoroughness and reaching posterior peri area      Functional mobility during ADLs: Min guard       Vision Patient Visual Report: No change from baseline       Perception     Praxis      Pertinent Vitals/Pain Pain Assessment: Faces Faces Pain Scale: Hurts little more Pain Location: Perirectal Pain Descriptors / Indicators: Discomfort Pain Intervention(s): Monitored during session;Limited activity within patient's tolerance     Hand Dominance Right   Extremity/Trunk Assessment Upper Extremity Assessment Upper Extremity Assessment: Generalized weakness   Lower Extremity Assessment Lower Extremity Assessment: Defer to PT evaluation   Cervical / Trunk Assessment Cervical / Trunk Assessment: Normal   Communication Communication Communication: No difficulties   Cognition Arousal/Alertness: Awake/alert Behavior During Therapy: WFL for tasks assessed/performed Overall Cognitive Status: Within Functional Limits for tasks assessed  General Comments: Pt tangential    General Comments       Exercises Exercises: Other exercises Other Exercises Other Exercises: Pt provided with green theraband and was instructed in bil. horizontal abduction, diagonals, elbow flex and ext.  He demonstrated x 1, and then set it aside.  He was instructed to perform several times a day    Shoulder Instructions      Home Living  Family/patient expects to be discharged to:: Skilled nursing facility                                 Additional Comments: Pt uses scooter for community mobility; Walking stick      Prior Functioning/Environment Level of Independence: Independent with assistive device(s)        Comments: Pt takes care of mother and Aunt        OT Problem List: Decreased strength;Decreased activity tolerance;Impaired balance (sitting and/or standing);Decreased knowledge of use of DME or AE;Pain;Obesity      OT Treatment/Interventions:      OT Goals(Current goals can be found in the care plan section) Acute Rehab OT Goals Patient Stated Goal: to heal his wounds and regain independence  OT Goal Formulation: All assessment and education complete, DC therapy  OT Frequency:     Barriers to D/C:            Co-evaluation              AM-PAC PT "6 Clicks" Daily Activity     Outcome Measure Help from another person eating meals?: None Help from another person taking care of personal grooming?: None Help from another person toileting, which includes using toliet, bedpan, or urinal?: A Lot Help from another person bathing (including washing, rinsing, drying)?: A Lot Help from another person to put on and taking off regular upper body clothing?: None Help from another person to put on and taking off regular lower body clothing?: A Lot 6 Click Score: 18   End of Session    Activity Tolerance: Patient limited by pain Patient left: in bed;with call bell/phone within reach  OT Visit Diagnosis: Pain;Unsteadiness on feet (R26.81) Pain - part of body: (perianal area )                Time: 1251-1311 OT Time Calculation (min): 20 min Charges:  OT General Charges $OT Visit: 1 Visit OT Evaluation $OT Eval Moderate Complexity: 1 Mod G-Codes:     [facility], OTR/L [phone]   Lucille Passy M 01/11/2018, 7:38 PM

## 2018-01-11 NOTE — Social Work (Signed)
CSW aware pt medically stable for discharge. Pt first choice [facility] has a bed. Will facilitate d/c when paperwork completed tomorrow.  Alexander Mt, [place] Work [phone]

## 2018-01-11 NOTE — Progress Notes (Signed)
Patient ID: Brandon Marks, male   DOB: 05/01/1956, 62 y.o.   MRN: [id number]    6 Days Post-Op  Subjective: Pt talkative this morning and feeling well.  Having BMs.  Wants to get up and mobilize more.    Objective: Vital signs in last 24 hours: Temp:  [98 F (36.7 C)-99.5 F (37.5 C)] 98.8 F (37.1 C) (06/17 0340) Pulse Rate:  [63-86] 63 (06/17 0340) Resp:  [15-19] 15 (06/17 0340) BP: (140-154)/(63-71) 144/64 (06/17 0340) SpO2:  [96 %-98 %] 96 % (06/17 0340) Last BM Date: 01/10/18  Intake/Output from previous day: 06/16 0701 - 06/17 0700 In: 990 [P.O.:990] Out: 1502 [Urine:1500; Stool:2] Intake/Output this shift: No intake/output data recorded.  PE: Heart: regular Lungs: CTAB Wound: packed and covered.  Will evaluate with hydrotherapy today.  Lab Results:  Recent Labs    01/10/18 0555 01/11/18 0500  WBC 22.9* 19.5*  HGB 10.7* 10.3*  HCT 31.6* 30.5*  PLT 378 353   BMET Recent Labs    01/10/18 0555 01/11/18 0500  NA 139 139  K 3.7 3.8  CL 106 105  CO2 29 28  GLUCOSE 94 84  BUN 16 15  CREATININE 1.00 0.89  CALCIUM 8.1* 8.1*   PT/INR No results for input(s): LABPROT, INR in the last 72 hours. CMP     Component Value Date/Time   NA 139 01/11/2018 0500   K 3.8 01/11/2018 0500   CL 105 01/11/2018 0500   CO2 28 01/11/2018 0500   GLUCOSE 84 01/11/2018 0500   BUN 15 01/11/2018 0500   CREATININE 0.89 01/11/2018 0500   CREATININE 0.85 08/30/2013 1104   CALCIUM 8.1 (L) 01/11/2018 0500   PROT 5.0 (L) 01/09/2018 0530   ALBUMIN 2.0 (L) 01/09/2018 0530   AST 16 01/09/2018 0530   ALT 15 (L) 01/09/2018 0530   ALKPHOS 50 01/09/2018 0530   BILITOT 0.7 01/09/2018 0530   GFRNONAA >60 01/11/2018 0500   GFRAA >60 01/11/2018 0500   Lipase     Component Value Date/Time   LIPASE 42 02/27/2012 0811       Studies/Results: No results found.  Anti-infectives: Anti-infectives (From admission, onward)   Start     Dose/Rate Route Frequency Ordered Stop   01/10/18 1100  amoxicillin-clavulanate (AUGMENTIN) 875-125 MG per tablet 1 tablet     1 tablet Oral Every 12 hours 01/10/18 0940 01/17/18 0959   01/06/18 2300  piperacillin-tazobactam (ZOSYN) IVPB 3.375 g  Status:  Discontinued     3.375 g 12.5 mL/hr over 240 Minutes Intravenous Every 8 hours 01/06/18 1540 01/06/18 1936   01/06/18 2100  piperacillin-tazobactam (ZOSYN) IVPB 3.375 g  Status:  Discontinued     3.375 g 12.5 mL/hr over 240 Minutes Intravenous Every 8 hours 01/06/18 1936 01/10/18 0940   01/06/18 1600  piperacillin-tazobactam (ZOSYN) IVPB 3.375 g  Status:  Discontinued     3.375 g 100 mL/hr over 30 Minutes Intravenous  Once 01/06/18 1545 01/06/18 1935   01/04/18 2200  ceFEPIme (MAXIPIME) 2 g in sodium chloride 0.9 % 100 mL IVPB  Status:  Discontinued    Note to Pharmacy:  Pharm to dose   2 g 200 mL/hr over 30 Minutes Intravenous Every 12 hours 01/04/18 1428 01/06/18 1537   01/04/18 1600  clindamycin (CLEOCIN) IVPB 600 mg  Status:  Discontinued     600 mg 100 mL/hr over 30 Minutes Intravenous Every 8 hours 01/04/18 1505 01/05/18 1145   01/04/18 1515  doxycycline (VIBRA-TABS) tablet 100 mg  100 mg Oral Every 12 hours 01/04/18 1505     01/04/18 1145  ceFEPIme (MAXIPIME) 2 g in sodium chloride 0.9 % 100 mL IVPB    Note to Pharmacy:  Pharm to dose   2 g 200 mL/hr over 30 Minutes Intravenous To Surgery 01/04/18 1131 01/05/18 1143   01/04/18 1145  metronidazole (FLAGYL) IVPB 500 mg  Status:  Discontinued     500 mg 100 mL/hr over 60 Minutes Intravenous To Surgery 01/04/18 1131 01/04/18 1412   01/03/18 1000  metroNIDAZOLE (FLAGYL) IVPB 500 mg  Status:  Discontinued     500 mg 100 mL/hr over 60 Minutes Intravenous Every 8 hours 01/03/18 0956 01/06/18 1537   01/02/18 1000  ceFEPIme (MAXIPIME) 2 g in sodium chloride 0.9 % 100 mL IVPB  Status:  Discontinued    Note to Pharmacy:  Pharm to dose   2 g 200 mL/hr over 30 Minutes Intravenous Every 12 hours 01/02/18 0340 01/04/18 1131    01/02/18 0600  vancomycin (VANCOCIN) IVPB 1000 mg/200 mL premix  Status:  Discontinued     1,000 mg 200 mL/hr over 60 Minutes Intravenous Every 12 hours 01/02/18 0409 01/05/18 1102   01/01/18 2330  ceFEPIme (MAXIPIME) 2 g in sodium chloride 0.9 % 100 mL IVPB     2 g 200 mL/hr over 30 Minutes Intravenous  Once 01/01/18 2321 01/02/18 0038   01/01/18 2315  vancomycin (VANCOCIN) 2,000 mg in sodium chloride 0.9 % 500 mL IVPB     2,000 mg 250 mL/hr over 120 Minutes Intravenous  Once 01/01/18 2313 01/05/18 1144   01/01/18 1945  cefTRIAXone (ROCEPHIN) 1 g in sodium chloride 0.9 % 100 mL IVPB     1 g 200 mL/hr over 30 Minutes Intravenous  Once 01/01/18 1932 01/01/18 2202   01/01/18 1945  metroNIDAZOLE (FLAGYL) IVPB 500 mg     500 mg 100 mL/hr over 60 Minutes Intravenous  Once 01/01/18 1932 01/01/18 2317       Assessment/Plan Sepsis secondary to Necrotizing fasciitis of perianal region, s/p debridement of necrotic tissue, Dr. Ninfa Linden, 01-02-18, Dr. Grandville Silos 01-04-18, 01-05-18 -Wet toDrydressing changes to wound along with hydrotherapy.Paper tape, monitor rash on bottom -SNF for wound care at discharge. no help at home and this wound is not in a place where he can change it himself.  Hydrotherapy will not be needed at SNF. First CX e coli and actinomyces,Next culture e coli Plan Zosyn until 6/16 then Augmentin PO for 7d. - doxy for tick -WBC improving.  Down to 80.  AF  Sinus Arrhythmia-stable Recent UTI ARI-resolved, 0.89 HTN -on home meds Hyperkalemia/hypomagnesemia -K is3.8 Morbid obesity-lost 95# after gastric bypass59months ago at [facility] SOB- O2 sats 98% on RA - pt has edema likely from 3rd spacing, medicine gave lasix - chest xray06/14 neg for any acute abnormalities, breathing improved   FEN - regular diet, multivitamin, b12 VTE -SCDs/Lovenox ID -zosyn stopped 06/16,doxy, Augmentin 06/16-06/23 Follow up: [facility] (will clarify today)  Dispo: SNF.   Will evaluate wound with hydro today.   LOS: 9 days    Henreitta Cea , [facility] Surgery 01/11/2018, 7:51 AM Pager: [phone]

## 2018-01-11 NOTE — Discharge Summary (Signed)
Patient ID: Brandon Marks [id number] [birth year] 62 y.o.  Admit date: 01/01/2018 Discharge date: 01/12/2018  Admitting Diagnosis: Necrotizing fasciitis of right buttock ARI HTN Morbid obesity, s/p recent weight loss surgery  Discharge Diagnosis Patient Active Problem List   Diagnosis Date Noted  . Necrotizing fasciitis ([place]) 01/02/2018  . Super obesity 01/20/2015  . High cholesterol 01/20/2015  . Chest pain 02/27/2012  . Obesity, morbid ([place]) 02/27/2012  . Dyspnea, chronic DOE 02/27/2012  . Herniated disc   . HYPERLIPIDEMIA 02/23/2009  . 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 Essential hypertension 10/18/2007  . Obstructive sleep apnea 10/11/2007    Consultants Hospitalist  Reason for Admission: This gentleman presents with a one-week history of perianal pain.  He denies fevers and chills.  Surgery was consulted after a CT scan showed the perirectal abscess.  He reports having a previous one approximately 20 years ago.  He is otherwise without complaints.  He reports recently having had gastric bypass surgery at [facility] in February.  He has no issues moving his bowels.  Procedures #1 - INCISION, DRAINAGE, AND SHARP DEBRIDEMENT PERIRECTAL ABSCESS AND NECROTIZING FASCIITIS, Dr. Nedra Hai, 6-8-1 #2 - REMOVAL OF TICK FROM R SCROTUM IRRIGATION AND DEBRIDEMENT OF BUTTOCKS, Dr. Georganna Skeans, 01-04-18 #3 - EXAM UNDER ANESTHESIA DEBRIDEMENT BUTTOCKS ABSCESS, Dr. Georganna Skeans, 01-05-18  Hospital Course:  Necrotizing Fasciitis of R Buttock The patient was admitted and placed on abx therapy.  He was taken to the OR where he underwent incision, drainage, and debridement of this area.  His dressing was kept in place until he returned to the OR on Monday 6-10 where he underwent further debridement.  He once again required debridement the following day for the final time.  He was noted to be in septic shock from this infection and this gradually improved as his  wound continued to undergo debridement and clean up.  After his debridements in the OR were complete, daily hydrotherapy was ordered for the duration of his stay.  His wound cleaned up nicely.  He was tolerating daily dressing changes at the bedside with no issues.  He will require dressing changes that he can not do himself and will go to a skilled nursing facility for further wound care.  Tick The patient was noted on 01-04-18 in the OR to have a tick attached to his right scrotum.  This was removed fully intact.  He was treated prophylactically with 7 days of doxycycline.   No signs of symptoms of tick-borne disease were noted during his stay.  ARI Upon arrive, secondary to septic shock, the patient was noted to have a creatinine of 2.10.  After fluid hydration this resolved and normalized to around 0.89.  HTN The patient was initially hypotensive on admission and for the first 1-2 days.  However, as his sepsis improved, his BP returned to a hypertensive state.  His home mediations were resumed and this was stable throughout his stay.  Sinus arrhythmia Patient was noted on arrival to have a sinus arrhythmia.  Medicine evaluated the patient and this was found to actually be a chronic thing that he has had in the past.  This also remained stable and no further intervention was required.  Physical Exam: Heart: regular Lungs: CTAB Skin: wound is stable and clean.  See picture from yesterday  Allergies as of 01/12/2018      Reactions   Codeine Nausea And Vomiting   Oxycodone Other (See Comments)   Upset GI  Medication List    STOP taking these medications   predniSONE 5 MG tablet Commonly known as:  DELTASONE     TAKE these medications   amoxicillin-clavulanate 875-125 MG tablet Commonly known as:  AUGMENTIN Take 1 tablet by mouth every 12 (twelve) hours for 6 days.   HYDROcodone-acetaminophen 5-325 MG tablet Commonly known as:  NORCO/VICODIN Take 1-2 tablets by mouth every 4  (four) hours as needed for moderate pain.   levocetirizine 5 MG tablet Commonly known as:  XYZAL Take 5 mg by mouth daily.   lisinopril-hydrochlorothiazide 20-25 MG tablet Commonly known as:  PRINZIDE,ZESTORETIC Take 1 tablet by mouth daily.   lovastatin 40 MG tablet Commonly known as:  MEVACOR Take 1 tablet (40 mg total) by mouth at bedtime.   omeprazole 20 MG capsule Commonly known as:  PRILOSEC Take 1 capsule by mouth daily.        Follow-up Information    Coralie Keens, MD Follow up on 01/26/2018.   Specialty:  General Surgery Why:  arrive at 2:20pm for 2:50pm appointment Contact information: [address] [place] [address] [phone]        Tamsen Roers, MD. Call in 1 week(s).   Specialty:  Family Medicine Why:  call to see your pcp for post hospital follow up in 1 week after discharge from nursing facility Contact information: [place] [address] [phone]        Sanda Klein, MD .   Specialty:  Cardiology Contact information: [address] [place] [address] [phone]           Signed: Saverio Danker, [facility] 01/12/2018, 9:27 AM Pager: [phone]

## 2018-01-11 NOTE — Discharge Instructions (Signed)
Normal saline wet to dry dressing change to buttock wound daily and as needed for stool contamination.  Will need Kerlex gauze as 4x4 may get lost in wound due to size. Stool softener as needed for BMs.

## 2018-01-12 MED ORDER — HYDROCODONE-ACETAMINOPHEN 5-325 MG PO TABS: 1.0000 | ORAL_TABLET | ORAL | 0 refills | Status: DC | PRN

## 2018-01-12 MED ORDER — AMOXICILLIN-POT CLAVULANATE 875-125 MG PO TABS: 1.0000 | ORAL_TABLET | Freq: Two times a day (BID) | ORAL | 0 refills | Status: AC

## 2018-01-12 MED ORDER — HYDROCHLOROTHIAZIDE 25 MG PO TABS
25.0000 mg | ORAL_TABLET | Freq: Every day | ORAL | Status: DC
  Administered 2018-01-12: 25 mg via ORAL
  Filled 2018-01-12: qty 1

## 2018-01-12 NOTE — Care Management Note (Signed)
Case Management Note  Patient Details  Name: Brandon Marks MRN: [id number] Date of Birth: 02-13-1956  Subjective/Objective: Pt is a 62 y/o male with 1 week h/o perianal pain.  CT showing a necrotizing perirectal abscess.  PTA, pt independent with assistive devices; lives with and cares for elderly mother.                   Action/Plan: Pt will need SNF at discharge for wound care and short term rehab.  CSW to follow to facilitate discharge to SNF upon medical stability.  Expected Discharge Date:  01/12/18               Expected Discharge Plan:  Skilled Nursing Facility  In-House Referral:  Clinical Social Work  Discharge planning Services  CM Consult  Post Acute Care Choice:    Choice offered to:     DME Arranged:    DME Agency:     HH Arranged:    [place] Agency:     Status of Service:  Completed, signed off  If discussed at [employer], dates discussed:    Additional Comments:  01/12/18 J. Nizhoni Parlow, RN, BSN Pt medically stable for discharge today.  Plan dc to SNF, per CSW arrangements.   Ella Bodo, RN 01/12/2018, 3:51 PM

## 2018-01-12 NOTE — Clinical Social Work Placement (Signed)
   CLINICAL SOCIAL WORK PLACEMENT  NOTE [facility] RN to call [phone] with report  prior to discharge.  Date:  01/12/2018  Patient Details  Name: Brandon Marks MRN: [id number] Date of Birth: [birth year]  Clinical Social Work is seeking post-discharge placement for this patient at the [place] level of care (*CSW will initial, date and re-position this form in  chart as items are completed):  Yes   Patient/family provided with [place] Work Department's list of facilities offering this level of care within the geographic area requested by the patient (or if unable, by the patient's family).  Yes   Patient/family informed of their freedom to choose among providers that offer the needed level of care, that participate in Medicare, Medicaid or managed care program needed by the patient, have an available bed and are willing to accept the patient.  Yes   Patient/family informed of [place]'s ownership interest in [facility] and [facility], as well as of the fact that they are under no obligation to receive care at these facilities.  PASRR submitted to EDS on       PASRR number received on 01/07/18     Existing PASRR number confirmed on       FL2 transmitted to all facilities in geographic area requested by pt/family on 01/07/18     FL2 transmitted to all facilities within larger geographic area on       Patient informed that his/her managed care company has contracts with or will negotiate with certain facilities, including the following:        Yes   Patient/family informed of bed offers received.  Patient chooses bed at  [place] recommends and patient chooses bed at      Patient to be transferred to   on 01/12/2018.  Patient to be transferred to facility by PTAR     Patient family notified on 01/12/2018 of transfer.  Name of family member notified:  pt responsible for self     PHYSICIAN Please  prepare priority discharge summary, including medications, Please prepare prescriptions     Additional Comment:    _______________________________________________ Alexander Mt, [place] 01/12/2018, 11:24 AM

## 2018-01-12 NOTE — Care Management Important Message (Signed)
Important Message  Patient Details  Name: Brandon Marks MRN: [id number] Date of Birth: 01/02/1956   Medicare Important Message Given:  Yes    [name] Kenta Laster 01/12/2018, 3:02 PM

## 2018-01-12 NOTE — Social Work (Addendum)
CSW aware pt will complete paperwork with [facility] representative between 10 and 11am. Discharge information sent to facility. Aware pt discharging today.   Will arrange PTAR when all paperwork complete with SNF. CSW also checking on notary information for pt will.   11:05am- CSW had requested chaplain stop by and look at document pt requesting to be notarized. Pt document upon visit by chaplain is a will which is a durable document this is unable to be notarized here at hospital. Attending team updated, requested discharge orders to facilitate transportation to SNF after lunch.   Alexander Mt, [place] Work [phone]

## 2018-01-12 NOTE — Social Work (Signed)
Clinical Social Worker facilitated patient discharge including contacting patient family and facility to confirm patient discharge plans.  Clinical information faxed to facility and family agreeable with plan.  CSW arranged ambulance transport via PT[facility] to [facility] at 2pm.  RN to call [phone] with report  prior to discharge.  Clinical Social Worker will sign off for now as social work intervention is no longer needed. Please consult [place] again if new need arises.  Alexander Mt, [place] Social Worker [phone]

## 2018-01-12 NOTE — Progress Notes (Signed)
RT set up patient CPAP at beside. NO O2 bleed in needed. Patient is able to place himself on when he is ready.

## 2018-01-12 NOTE — Progress Notes (Signed)
   01/12/18 1000  Clinical Encounter Type  Visited With Patient  Visit Type Spiritual support  Referral From Social work  Consult/Referral To Chaplain  Spiritual Encounters  Spiritual Needs Emotional;Prayer;Literature  Stress Factors  Patient Stress Factors Health changes;Loss  Chaplain visited with Pt at request of social worker.  PT had a request for prayer and for a will to be notarized.  The will is outside durable paperwork and cannot be notarized in the hospital.  PT will have to seek notary upon leaving.  The PT states he is missing his wallet and Chaplain will get with nurse to call security and see if his wallet is being held by them..the patient states he came in EMS and wallet was in his pants.

## 2018-01-12 NOTE — Progress Notes (Signed)
Physical Therapy Wound Treatment Patient Details  Name: Brandon Marks MRN: [id number] Date of Birth: 12/21/1955  Today's Date: 01/12/2018 Time: 0956-1023 Time Calculation (min): 27 min  Subjective  Patient and Family Stated Goals: Heal wound Prior Treatments: I&D x3  Pain Score: Pain Score: 3   Wound Assessment  Wound / Incision (Open or Dehisced) 01/07/18 Incision - Open Rectum (Active)  Dressing Type ABD;Gauze (Comment);Barrier Film (skin prep);Moist to dry 01/12/2018 10:00 AM  Dressing Changed Changed 01/12/2018 10:00 AM  Dressing Status Clean;Dry;Intact 01/12/2018 10:00 AM  Dressing Change Frequency Daily 01/12/2018 10:00 AM  Site / Wound Assessment Pink;Yellow 01/12/2018 10:00 AM  % Wound base Red or Granulating 90% 01/12/2018 10:00 AM  % Wound base Yellow/Fibrinous Exudate 10% 01/12/2018 10:00 AM  % Wound base Black/Eschar 0% 01/12/2018 10:00 AM  % Wound base Other/Granulation Tissue (Comment) 0% 01/12/2018 10:00 AM  Peri-wound Assessment Erythema (blanchable) 01/12/2018 10:00 AM  Wound Length (cm) 7 cm 01/07/2018  3:51 PM  Wound Width (cm) 10.1 cm 01/07/2018  3:51 PM  Wound Depth (cm) 9 cm 01/07/2018  3:51 PM  Wound Volume (cm^3) 636.3 cm^3 01/07/2018  3:51 PM  Wound Surface Area (cm^2) 70.7 cm^2 01/07/2018  3:51 PM  Tunneling (cm) 6.5 cm at 9:00, 8.5 cm at 1:00 01/07/2018  3:51 PM  Margins Unattached edges (unapproximated) 01/12/2018 10:00 AM  Closure None 01/12/2018 10:00 AM  Drainage Amount Minimal 01/12/2018 10:00 AM  Drainage Description Serosanguineous 01/12/2018 10:00 AM  Treatment Debridement (Selective);Hydrotherapy (Pulse lavage);Packing (Saline gauze) 01/12/2018 10:00 AM   Hydrotherapy Pulsed lavage therapy - wound location: perirectal wound Pulsed Lavage with Suction (psi): 8 psi(4-8) Pulsed Lavage with Suction - Normal Saline Used: 1000 mL Pulsed Lavage Tip: Tip with splash shield Selective Debridement Selective Debridement - Location: perirectal wound Selective  Debridement - Tools Used: Forceps;Scissors Selective Debridement - Tissue Removed: yellow necrotic tissue   Wound Assessment and Plan  Wound Therapy - Assess/Plan/Recommendations Wound Therapy - Clinical Statement: Wound continues to show good progress with removal of necrotic tissue. Brandon Marks to transfer to SNF and will continue with dressing changes there.  Wound Therapy - Functional Problem List: Decreased tolerance for OOB and positioning Factors Delaying/Impairing Wound Healing: Infection - systemic/local Hydrotherapy Plan: Debridement;Dressing change;Patient/family education;Pulsatile lavage with suction Wound Therapy - Frequency: 6X / week Wound Therapy - Follow Up Recommendations: Skilled nursing facility Wound Plan: See above  Wound Therapy Goals- Improve the function of patient's integumentary system by progressing the wound(s) through the phases of wound healing (inflammation - proliferation - remodeling) by: Decrease Necrotic Tissue to: 5 Decrease Necrotic Tissue - Progress: Progressing toward goal Increase Granulation Tissue to: 95 Increase Granulation Tissue - Progress: Progressing toward goal  Goals will be updated until maximal potential achieved or discharge criteria met.  Discharge criteria: when goals achieved, discharge from hospital, MD decision/surgical intervention, no progress towards goals, refusal/missing three consecutive treatments without notification or medical reason.  GP     Cary W Maycok 01/12/2018, 10:51 AM Cary Maycock Brandon Marks [phone]   

## 2018-01-12 NOTE — Progress Notes (Signed)
PROGRESS NOTE  Brandon Marks UMP:[id number] DOB: 1955-09-10 DOA: 01/01/2018 PCP: Tamsen Roers, MD   LOS: 10 days   Brief Narrative / Interim history: 62 year old male with history of morbid obesity status post gastric bypass, hypertension, sleep apnea on CPAP who presented with perianal pain, found to have perirectal abscess with deep necrotizing fasciitis.  He was admitted to general surgery service and we were asked to consult for acute kidney injury and hypertension.  Assessment & Plan: Active Problems:   Necrotizing fasciitis ([place])   Acute kidney injury -Likely prerenal in the setting of sepsis, he received IV fluids antibiotics.  Kidney function has normalized -He had fluid overload status post IV Lasix x4 days, renal function is remained stable.  Resume hydrochlorothiazide on discharge  Sepsis due to necrotizing fasciitis -patient underwent I&D on 6/8, 6/10, 6/11 -Wound microbiology with moderate E. coli which is pansensitive, also with gram-positive rods, gram-negative rods, actinomyces, final pending -Antibiotics per surgical team  Hypertension -Continue lisinopril and hydrochlorothiazide  Sinus arrhythmia -Occasional PACs and PVCs, doubt clinically significant  Fluid overload -Resume hydrochlorothiazide, eventually able to achieve euvolemia, he is on room air and has no breathing difficulties, still has some bilateral lower foot swelling but minimal  Diarrhea -Likely in the setting of antibiotics, C. difficile was negative -Diarrhea resolved  Okay to discharge from medicine standpoint   DVT prophylaxis: Lovenox Code Status: Full code Family Communication: No family present at bedside Disposition Plan: Discharge today per general surgery  Procedures:   I&D 6/8, 6/10, 6/11  Antimicrobials:  Augmentin  Doxycycline  Subjective: -Feeling well  Objective: Vitals:   01/11/18 2306 01/12/18 0300 01/12/18 0337 01/12/18 0740  BP: 135/61 134/67 134/67     Pulse: 79 64    Resp: 20 18    Temp: 98.4 F (36.9 C) 99.1 F (37.3 C)  98.9 F (37.2 C)  TempSrc: Oral Oral    SpO2: 95% 97%    Weight:      Height:        Intake/Output Summary (Last 24 hours) at 01/12/2018 1038 Last data filed at 01/12/2018 0900 Gross per 24 hour  Intake 480 ml  Output -  Net 480 ml   Filed Weights   01/02/18 0351 01/02/18 0634 01/05/18 1139  Weight: (!) 147 kg (324 lb 1.2 oz) (!) 147 kg (324 lb 1.2 oz) (!) 155.5 kg (342 lb 13 oz)    Examination:  Constitutional: NAD Respiratory: CTA Cardiovascular: RRR   Data Reviewed: I have independently reviewed following labs and imaging studies   CBC: Recent Labs  Lab 01/06/18 1107 01/07/18 0333 01/08/18 0500 01/10/18 0555 01/11/18 0500  WBC 22.7* 21.1* 21.5* 22.9* 19.5*  HGB 12.5* 11.8* 11.4* 10.7* 10.3*  HCT 36.9* 34.3* 33.2* 31.6* 30.5*  MCV 86.6 86.2 85.6 85.6 86.6  PLT 432* 423* 417* 378 400   Basic Metabolic Panel: Recent Labs  Lab 01/07/18 0333 01/08/18 0500 01/09/18 0530 01/10/18 0555 01/11/18 0500  NA 140 139 140 139 139  K 3.4* 3.2* 3.5 3.7 3.8  CL 107 107 107 106 105  CO2 24 26 29 29 28   GLUCOSE 86 83 94 94 84  BUN 24* 19 17 16 15   CREATININE 1.11 1.04 1.06 1.00 0.89  CALCIUM 8.4* 8.2* 8.3* 8.1* 8.1*   GFR: Estimated Creatinine Clearance: 133.3 mL/min (by C-G formula based on SCr of 0.89 mg/dL). Liver Function Tests: Recent Labs  Lab 01/09/18 0530  AST 16  ALT 15*  ALKPHOS 50  BILITOT 0.7  PROT 5.0*  ALBUMIN 2.0*   No results for input(s): LIPASE, AMYLASE in the last 168 hours. No results for input(s): AMMONIA in the last 168 hours. Coagulation Profile: No results for input(s): INR, PROTIME in the last 168 hours. Cardiac Enzymes: No results for input(s): CKTOTAL, CKMB, CKMBINDEX, TROPONINI in the last 168 hours. BNP (last 3 results) No results for input(s): PROBNP in the last 8760 hours. HbA1C: No results for input(s): HGBA1C in the last 72 hours. CBG: No  results for input(s): GLUCAP in the last 168 hours. Lipid Profile: No results for input(s): CHOL, HDL, LDLCALC, TRIG, CHOLHDL, LDLDIRECT in the last 72 hours. Thyroid Function Tests: No results for input(s): TSH, T4TOTAL, FREET4, T3FREE, THYROIDAB in the last 72 hours. Anemia Panel: No results for input(s): VITAMINB12, FOLATE, FERRITIN, TIBC, IRON, RETICCTPCT in the last 72 hours. Urine analysis:    Component Value Date/Time   COLORURINE AMBER (A) 01/02/2018 1123   APPEARANCEUR HAZY (A) 01/02/2018 1123   LABSPEC 1.019 01/02/2018 1123   PHURINE 5.0 01/02/2018 1123   GLUCOSEU NEGATIVE 01/02/2018 1123   HGBUR SMALL (A) 01/02/2018 1123   BILIRUBINUR NEGATIVE 01/02/2018 1123   KETONESUR NEGATIVE 01/02/2018 1123   PROTEINUR NEGATIVE 01/02/2018 1123   UROBILINOGEN 0.2 03/29/2017 1643   NITRITE NEGATIVE 01/02/2018 1123   LEUKOCYTESUR SMALL (A) 01/02/2018 1123   Sepsis Labs: Invalid input(s): PROCALCITONIN, LACTICIDVEN  Recent Results (from the past 240 hour(s))  Culture, Urine     Status: None   Collection Time: 01/02/18  9:06 PM  Result Value Ref Range Status   Specimen Description URINE, CLEAN CATCH  Final   Special Requests NONE  Final   Culture   Final    NO GROWTH Performed at [facility], [address], [place], [place] [address]    Report Status 01/04/2018 FINAL  Final  Aerobic/Anaerobic Culture (surgical/deep wound)     Status: None   Collection Time: 01/04/18 12:13 PM  Result Value Ref Range Status   Specimen Description WOUND  Final   Special Requests BUTTOCKS  Final   Gram Stain   Final    RARE WBC PRESENT, PREDOMINANTLY PMN MODERATE GRAM VARIABLE ROD FEW GRAM POSITIVE COCCI IN PAIRS Performed at [facility], [address], [place], [place] [address]    Culture   Final    FEW ESCHERICHIA COLI MIXED ANAEROBIC FLORA PRESENT.  CALL LAB IF FURTHER IID REQUIRED.    Report Status 01/09/2018 FINAL  Final   Organism ID, Bacteria ESCHERICHIA COLI  Final        Susceptibility   Escherichia coli - MIC*    AMPICILLIN 8 SENSITIVE Sensitive     CEFAZOLIN <=4 SENSITIVE Sensitive     CEFEPIME <=1 SENSITIVE Sensitive     CEFTAZIDIME <=1 SENSITIVE Sensitive     CEFTRIAXONE <=1 SENSITIVE Sensitive     CIPROFLOXACIN <=0.25 SENSITIVE Sensitive     GENTAMICIN <=1 SENSITIVE Sensitive     IMIPENEM <=0.25 SENSITIVE Sensitive     TRIMETH/SULFA <=20 SENSITIVE Sensitive     AMPICILLIN/SULBACTAM 4 SENSITIVE Sensitive     PIP/TAZO <=4 SENSITIVE Sensitive     Extended ESBL NEGATIVE Sensitive     * FEW ESCHERICHIA COLI  C difficile quick scan w PCR reflex     Status: None   Collection Time: 01/08/18 11:08 PM  Result Value Ref Range Status   C Diff antigen NEGATIVE NEGATIVE Final   C Diff toxin NEGATIVE NEGATIVE Final   C Diff interpretation No  C. difficile detected.  Final      Radiology Studies: No results found.   Scheduled Meds: . amoxicillin-clavulanate  1 tablet Oral Q12H  . bethanechol  50 mg Oral TID  . Chlorhexidine Gluconate Cloth  6 each Topical Q0600  . enoxaparin (LOVENOX) injection  80 mg Subcutaneous Q24H  . hydrochlorothiazide  25 mg Oral Daily  . lisinopril  10 mg Oral Daily  . multivitamin  1 tablet Oral Daily  . potassium chloride  40 mEq Oral BID  . sodium chloride flush  10-40 mL Intracatheter Q12H  . tamsulosin  0.4 mg Oral Daily  . vitamin B-12  500 mcg Oral Daily   Continuous Infusions: . lactated ringers 10 mL/hr at 01/05/18 1201  . lactated ringers 10 mL/hr at 01/10/18 0602    Marzetta Board, MD, PhD [facility] Pager [phone]  If 7PM-7AM, please contact night-coverage [email or web address] Password [facility] 01/12/2018, 10:38 AM

## 2018-01-26 ENCOUNTER — Other Ambulatory Visit: Payer: Self-pay | Admitting: Surgery

## 2018-02-03 LAB — FUNGUS CULTURE WITH STAIN

## 2018-02-03 LAB — FUNGAL ORGANISM REFLEX

## 2018-02-03 LAB — FUNGUS CULTURE RESULT

## 2018-07-02 ENCOUNTER — Other Ambulatory Visit: Payer: Self-pay | Admitting: Physician Assistant

## 2018-07-13 ENCOUNTER — Other Ambulatory Visit: Payer: Self-pay | Admitting: Physician Assistant

## 2018-07-13 MED ORDER — LISINOPRIL-HYDROCHLOROTHIAZIDE 20-25 MG PO TABS: 1.0000 | ORAL_TABLET | Freq: Every day | ORAL | 0 refills | Status: DC

## 2018-07-13 NOTE — Telephone Encounter (Signed)
New message    *STAT* If patient is at the pharmacy, call can be transferred to refill team.   1. Which medications need to be refilled? (please list name of each medication and dose if known) lovastatin (MEVACOR) 40 MG tablet  lisinopril-hydrochlorothiazide (PRINZIDE,ZESTORETIC) 20-25 MG tablet  2. Which pharmacy/location (including street and city if local pharmacy) is medication to be sent to? [place], [address]  3. Do they need a 30 day or 90 day supply?[place]

## 2018-07-13 NOTE — Telephone Encounter (Signed)
This is Dr. Croitoru's pt 

## 2018-07-13 NOTE — Telephone Encounter (Signed)
Lisinopril-hctz refilled today  Lovastatin refilled 07/05/18 lovastatin (MEVACOR) 40 MG tablet 90 tablet 0 07/05/2018    Sig: TAKE 1 TABLET BY MOUTH ONCE DAILY AT BEDTIME   Sent to pharmacy as: lovastatin (MEVACOR) 40 MG tablet   E-Prescribing Status: Receipt confirmed by pharmacy (07/05/2018 2:28 PM EST)   Pharmacy   [place], [place]

## 2018-08-06 ENCOUNTER — Telehealth: Payer: Self-pay | Admitting: Internal Medicine

## 2018-08-06 NOTE — Telephone Encounter (Signed)
Yes, I will see him  TJ 

## 2018-08-06 NOTE — Telephone Encounter (Signed)
Copied from [place] [phone]. Topic: Appointment Scheduling - Scheduling Inquiry for Clinic >> Aug 06, 2018  8:22 AM Margot Ables wrote: Reason for CRM: Mr. Uher is calling to see if Dr. Ronnald Ramp would accept him as a patient. He has MCR/MCD insurance. His former PCP in [place], [place] is about to retire. His mother is a current pt with Dr. Ronnald Ramp, [place]. Please advise.

## 2018-08-09 NOTE — Telephone Encounter (Signed)
LVM for patient to call back and make New patient appt.

## 2018-09-01 ENCOUNTER — Ambulatory Visit: Payer: Medicare Other | Admitting: Physician Assistant

## 2018-09-02 ENCOUNTER — Encounter: Payer: Self-pay | Admitting: *Deleted

## 2018-09-13 ENCOUNTER — Ambulatory Visit: Payer: Medicare Other | Admitting: Physician Assistant

## 2018-09-14 ENCOUNTER — Other Ambulatory Visit: Payer: Self-pay | Admitting: Cardiovascular Disease

## 2018-09-15 NOTE — Telephone Encounter (Signed)
Rx(s) sent to pharmacy electronically.  

## 2018-09-30 ENCOUNTER — Ambulatory Visit: Payer: Medicare Other | Admitting: Physician Assistant

## 2018-10-01 ENCOUNTER — Encounter: Payer: Self-pay | Admitting: *Deleted

## 2018-10-13 ENCOUNTER — Ambulatory Visit: Payer: Medicare Other | Admitting: Family Medicine

## 2018-10-29 ENCOUNTER — Telehealth: Payer: Self-pay

## 2018-10-29 IMAGING — CT CT ABD-PELV W/O CM
2 of 7 series · 15 of 46 positions shown, 17 images · non-contrast
Comparison: MRI of the lumbar spine performed 01/13/2013

CLINICAL DATA: Acute onset of rectal pain. Assess for abscess.

EXAM:
CT ABDOMEN AND PELVIS WITHOUT CONTRAST
TECHNIQUE: Multidetector CT imaging of the abdomen and pelvis was performed
following the standard protocol without IV contrast.

[Series 3: ap without · axial · non-contrast · 0.98mm/px · z∈[+942,+1382]mm · 12 of 100 slices shown, 14 images]
[im 6/100  soft-tissue]
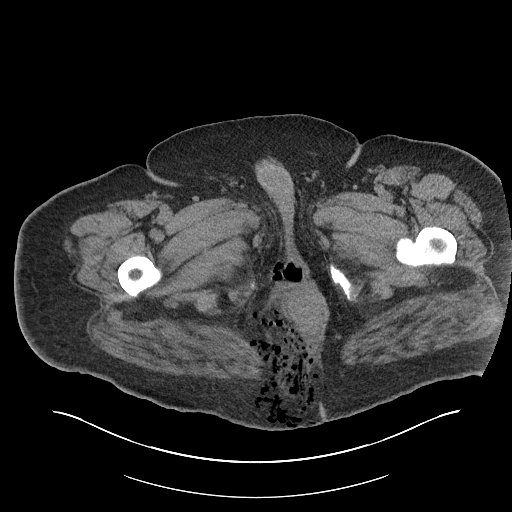
[im 6/100  bone]
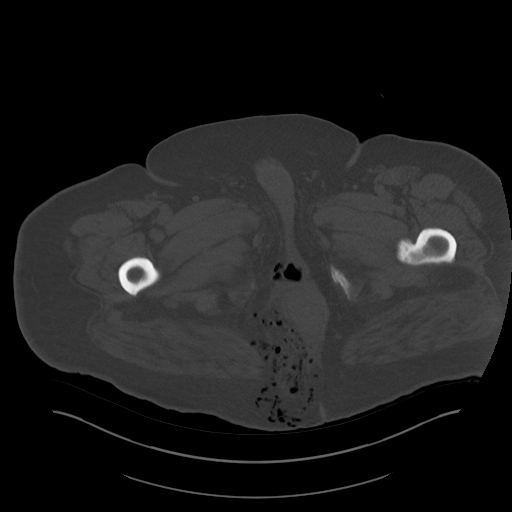
[im 17/100  soft-tissue]
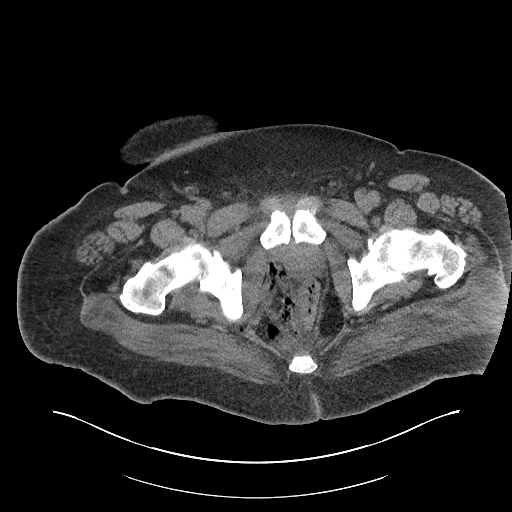
[im 23/100  soft-tissue]
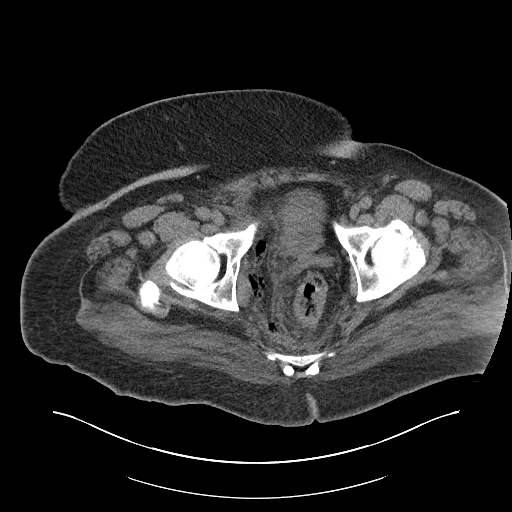
[im 28/100  soft-tissue]
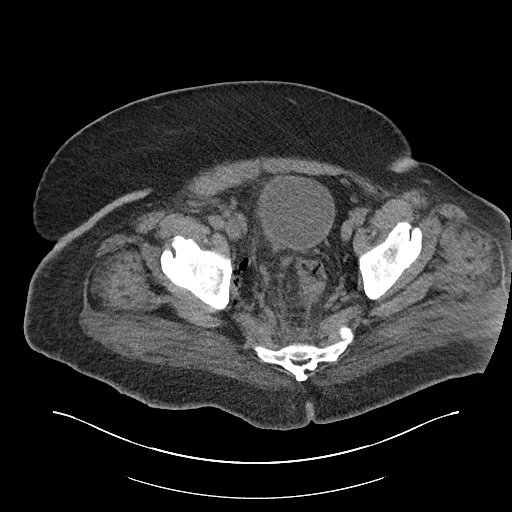
[im 39/100  soft-tissue]
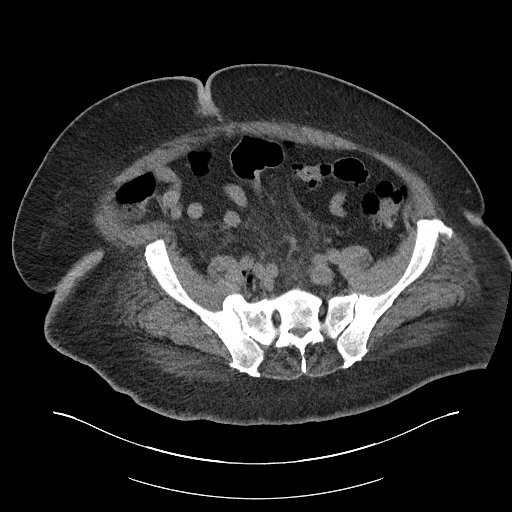
[im 45/100  soft-tissue]
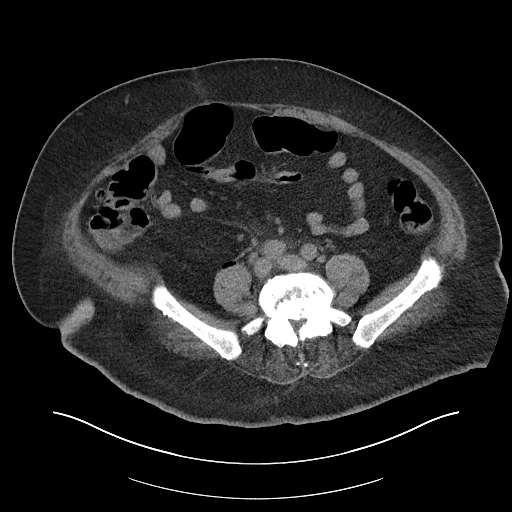
[im 56/100  soft-tissue]
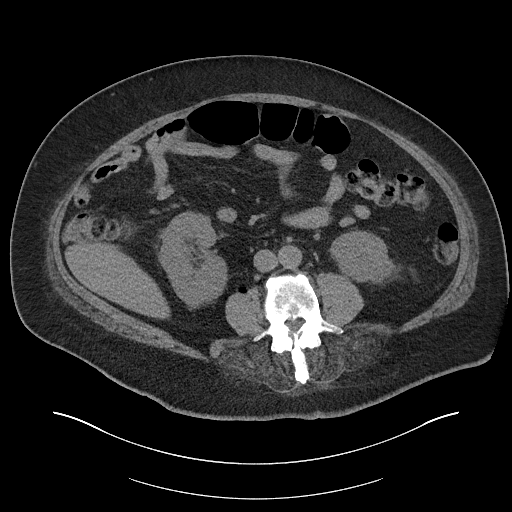
[im 61/100  soft-tissue]
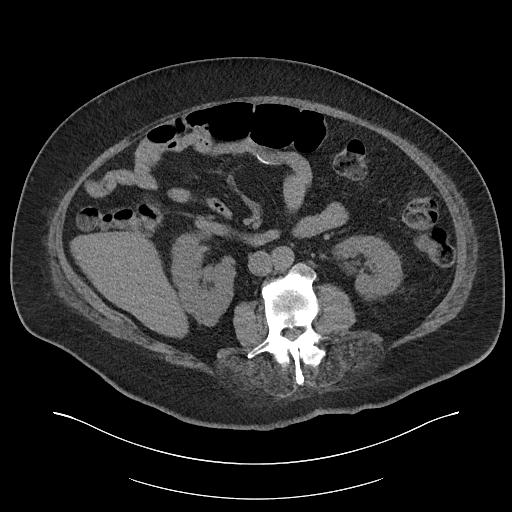
[im 72/100  soft-tissue]
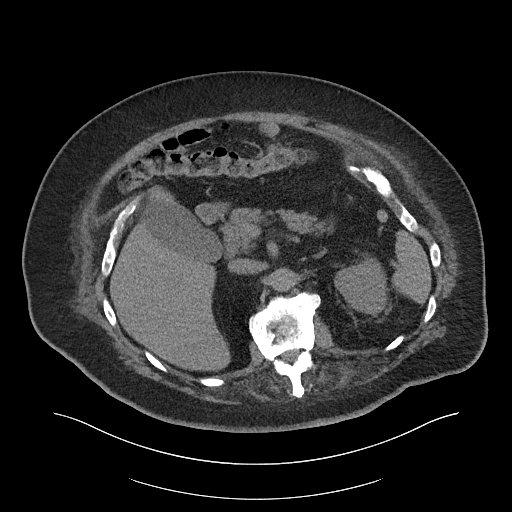
[im 72/100  bone]
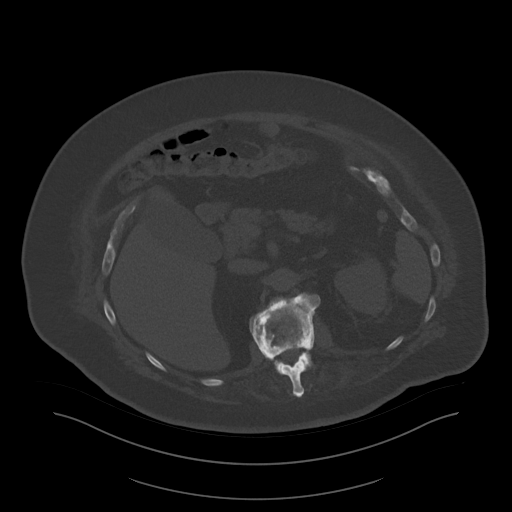
[im 78/100  soft-tissue]
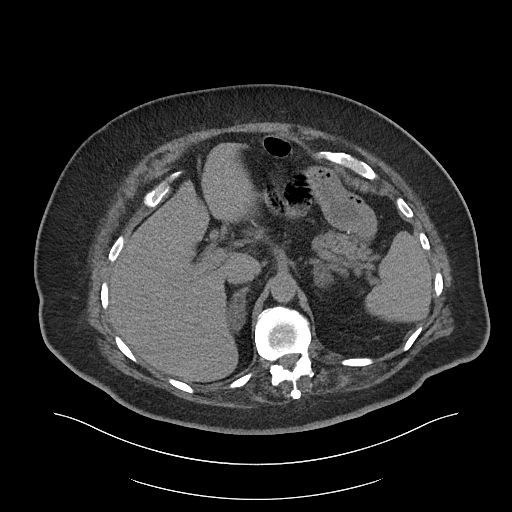
[im 83/100  soft-tissue]
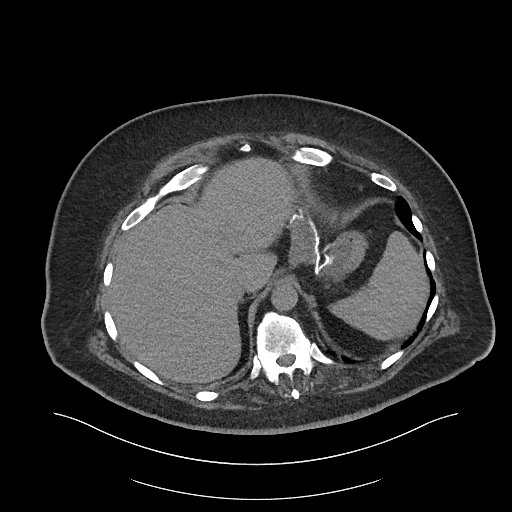
[im 94/100  soft-tissue]
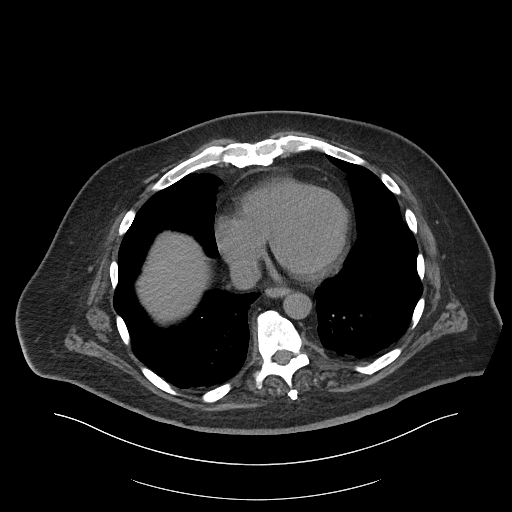

[Series 6: cor · coronal · 1.00mm/px · 3 of 112 slices shown]
[im 23/112  soft-tissue]
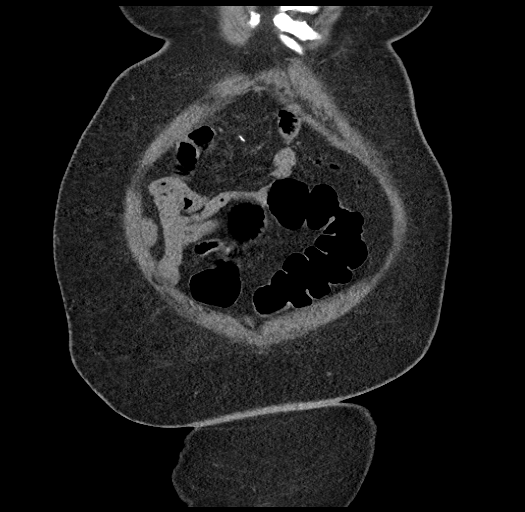
[im 45/112  soft-tissue]
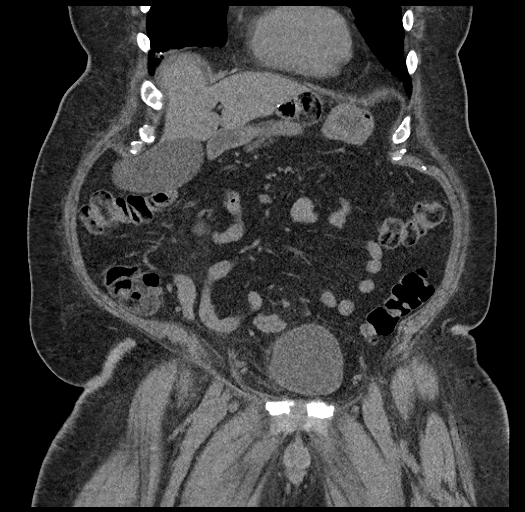
[im 67/112  soft-tissue]
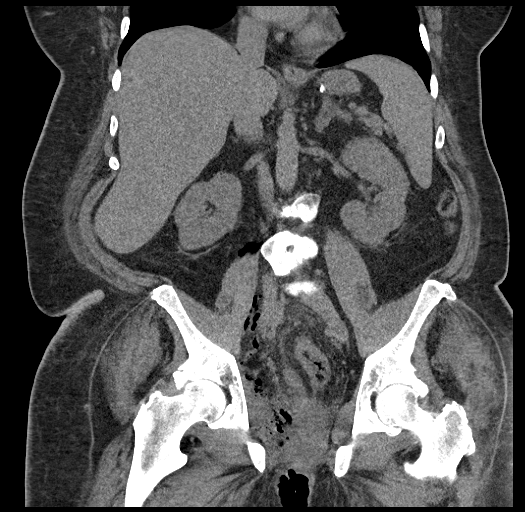

[15 of 46 positions shown; findings below may reference images not displayed]

FINDINGS: Lower chest: Minimal bibasilar atelectasis is noted. The visualized
portions of the mediastinum are unremarkable

Hepatobiliary: The liver is unremarkable in appearance. The
gallbladder is unremarkable in appearance. The common bile duct
remains normal in caliber.

Pancreas: The pancreas is within normal limits.

Spleen: The spleen is unremarkable in appearance.

Adrenals/Urinary Tract: A 1.7 cm left adrenal adenoma is noted. A
2.0 cm thin right adrenal adenoma is also seen.

Nonspecific perinephric stranding is noted bilaterally. There is no
evidence of hydronephrosis. A nonobstructing 7 mm stone is noted at
the upper pole of the left kidney. The kidneys are otherwise
unremarkable. No obstructing ureteral stones are seen.

Stomach/Bowel: The patient is status post gastric bypass surgery.
The gastrojejunal anastomosis is unremarkable in appearance. The
distal stomach is largely decompressed. The small bowel is within
normal limits. The appendix is normal in caliber, without evidence
of appendicitis.

Scattered diverticulosis is noted along the proximal sigmoid colon.
There is leftward displacement of the rectum, reflecting the soft
tissue infection along the right side of the pelvis.

Vascular/Lymphatic: The abdominal aorta is unremarkable in
appearance. The inferior vena cava is grossly unremarkable. No
retroperitoneal lymphadenopathy is seen.

Mildly prominent right-sided pelvic sidewall nodes likely reflect
the adjacent soft tissue infection.

Reproductive: The bladder is mildly distended. Mild soft tissue
inflammation is noted about the right side of the bladder. The
prostate remains normal in size.

Other: There is a large abscess containing fluid and air at the
right perineal space anterior to the anorectal canal, measuring
approximately 7.2 x 3.9 x 6.0 cm.

Diffuse soft tissue air tracks along the right side of the gluteal
cleft and medial to the right gluteus musculature, extending
superiorly to the right of the rectum and along the right pelvic
sidewall. A small amount of air tracks superiorly along the right
psoas musculature to the level of the kidneys. This pattern is
highly suspicious for necrotizing fasciitis. Underlying diffuse soft
tissue inflammation is noted.

Musculoskeletal: No acute osseous abnormalities are identified.
Anterior bridging osteophytes are noted along the lower thoracic and
lumbar spine. The visualized musculature is unremarkable in
appearance.
IMPRESSION: 1. Large abscess containing fluid and air at the right perineal
space anterior to the anorectal canal, measuring approximately 7.2 x
3.9 x 6.0 cm.
2. Diffuse soft tissue air noted at the right side of the gluteal
cleft and medial to the right gluteus musculature, extending
superiorly to the right of the rectum and along the right pelvic
sidewall. Small amount of air tracks superiorly along the right
psoas musculature to the level of the kidneys. This pattern is
highly suspicious for necrotizing fasciitis. Underlying diffuse soft
tissue inflammation noted.
3. Mildly prominent right-sided pelvic sidewall nodes likely reflect
the adjacent soft tissue infection.
4. Scattered diverticulosis along the proximal sigmoid colon,
without evidence of diverticulitis.
5. Nonobstructing 7 mm stone at the upper pole of the left kidney.
6. Bilateral adrenal adenomas noted.

Critical Value/emergent results were called by telephone at the time
of interpretation on 01/01/2018 at [DATE] to Dr. RANULFO KUHNS, who
verbally acknowledged these results.

## 2018-10-29 NOTE — Telephone Encounter (Signed)
Virtual Visit Pre-Appointment Phone Call  Steps For Call:  1. Confirm consent - "In the setting of the current Covid19 crisis, you are scheduled for a PHONE visit with your provider on 11/01/2018 at 3:30PM.  Just as we do with many in-office visits, in order for you to participate in this visit, we must obtain consent.  If you'd like, I can send this to your mychart (if signed up) or email for you to review.  Otherwise, I can obtain your verbal consent now.  All virtual visits are billed to your insurance company just like a normal visit would be.  By agreeing to a virtual visit, we'd like you to understand that the technology does not allow for your provider to perform an examination, and thus may limit your provider's ability to fully assess your condition.  Finally, though the technology is pretty good, we cannot assure that it will always work on either your or our end, and in the setting of a video visit, we may have to convert it to a phone-only visit.  In either situation, we cannot ensure that we have a secure connection.  Are you willing to proceed?"  2. Give patient instructions for WebEx download to smartphone as below if video visit  3. Advise patient to be prepared with any vital sign or heart rhythm information, their current medicines, and a piece of paper and pen handy for any instructions they may receive the day of their visit  4. Inform patient they will receive a phone call 15 minutes prior to their appointment time (may be from unknown caller ID) so they should be prepared to answer  5. Confirm that appointment type is correct in Epic appointment notes (video vs telephone)    TELEPHONE CALL NOTE  Brandon Marks has been deemed a candidate for a follow-up tele-health visit to limit community exposure during the Covid-19 pandemic. I spoke with the patient via phone to ensure availability of phone/video source, confirm preferred email & phone number, and discuss instructions  and expectations.  I reminded Brandon Marks to be prepared with any vital sign and/or heart rhythm information that could potentially be obtained via home monitoring, at the time of his visit. I reminded Brandon Marks to expect a phone call at the time of his visit if his visit.  Did the patient verbally acknowledge consent to treatment? YES  Brandon Marks, CMA 10/29/2018 4:09 PM   DOWNLOADING THE [place]  - If Apple, go to [employer] and type in WebEx in the search bar. [place] [employer], the blue/green circle. The app is free but as with any other app downloads, their phone may require them to verify saved payment information or Apple password. The patient does NOT have to create an account.  - If Android, ask patient to go to [employer] and type in WebEx in the search bar. [place] [employer], the blue/green circle. The app is free but as with any other app downloads, their phone may require them to verify saved payment information or Android password. The patient does NOT have to create an account.   CONSENT FOR TELE-HEALTH VISIT - PLEASE REVIEW  I hereby voluntarily request, consent and authorize [facility] and its employed or contracted physicians, physician assistants, nurse practitioners or other licensed health care professionals (the Practitioner), to provide me with telemedicine health care services (the "Services") as deemed necessary by the treating Practitioner. I acknowledge  and consent to receive the Services by the Practitioner via telemedicine. I understand that the telemedicine visit will involve communicating with the Practitioner through live audiovisual communication technology and the disclosure of certain medical information by electronic transmission. I acknowledge that I have been given the opportunity to request an in-person assessment or other available alternative prior to the telemedicine visit and am  voluntarily participating in the telemedicine visit.  I understand that I have the right to withhold or withdraw my consent to the use of telemedicine in the course of my care at any time, without affecting my right to future care or treatment, and that the Practitioner or I may terminate the telemedicine visit at any time. I understand that I have the right to inspect all information obtained and/or recorded in the course of the telemedicine visit and may receive copies of available information for a reasonable fee.  I understand that some of the potential risks of receiving the Services via telemedicine include:  Marland Kitchen Delay or interruption in medical evaluation due to technological equipment failure or disruption; . Information transmitted may not be sufficient (e.g. poor resolution of images) to allow for appropriate medical decision making by the Practitioner; and/or  . In rare instances, security protocols could fail, causing a breach of personal health information.  Furthermore, I acknowledge that it is my responsibility to provide information about my medical history, conditions and care that is complete and accurate to the best of my ability. I acknowledge that Practitioner's advice, recommendations, and/or decision may be based on factors not within their control, such as incomplete or inaccurate data provided by me or distortions of diagnostic images or specimens that may result from electronic transmissions. I understand that the practice of medicine is not an exact science and that Practitioner makes no warranties or guarantees regarding treatment outcomes. I acknowledge that I will receive a copy of this consent concurrently upon execution via email to the email address I last provided but may also request a printed copy by calling the office of [place].    I understand that my insurance will be billed for this visit.   I have read or had this consent read to me. . I understand the  contents of this consent, which adequately explains the benefits and risks of the Services being provided via telemedicine.  . I have been provided ample opportunity to ask questions regarding this consent and the Services and have had my questions answered to my satisfaction. . I give my informed consent for the services to be provided through the use of telemedicine in my medical care  By participating in this telemedicine visit I agree to the above.

## 2018-10-29 NOTE — Telephone Encounter (Signed)
   Cardiac Questionnaire:    Since your last visit or hospitalization:    1. Have you been having new or worsening chest pain? NO   2. Have you been having new or worsening shortness of breath? NO 3. Have you been having new or worsening leg swelling, wt gain, or increase in abdominal girth (pants fitting more tightly)? NO   4. Have you had any passing out spells? NO    *A YES to any of these questions would result in the appointment being kept. *If all the answers to these questions are NO, we should indicate that given the current situation regarding the worldwide coronarvirus pandemic, at the recommendation of the CDC, we are looking to limit gatherings in our waiting area, and thus will reschedule their appointment beyond four weeks from today.   _____________   [id number] Pre-Screening Questions:  . Do you currently have a fever? NO (yes = cancel and refer to pcp for e-visit) . Have you recently travelled on a cruise, internationally, or to [place], [place], [place], [place], [place], or [place], [place] [employer]) ? NO (yes = cancel, stay home, monitor symptoms, and contact pcp or initiate e-visit if symptoms develop) . Have you been in contact with someone that is currently pending confirmation of Covid19 testing or has been confirmed to have the [place] virus?  NO (yes = cancel, stay home, away from tested individual, monitor symptoms, and contact pcp or initiate e-visit if symptoms develop) . Are you currently experiencing fatigue or cough? NO (yes = pt should be prepared to have a mask placed at the time of their visit).        PATIENT IS AGREEABLE WITH PHONE VISIT.

## 2018-11-01 ENCOUNTER — Telehealth (INDEPENDENT_AMBULATORY_CARE_PROVIDER_SITE_OTHER): Payer: Medicare HMO | Admitting: Physician Assistant

## 2018-11-01 DIAGNOSIS — I10 Essential (primary) hypertension: Secondary | ICD-10-CM

## 2018-11-01 DIAGNOSIS — E785 Hyperlipidemia, unspecified: Secondary | ICD-10-CM

## 2018-11-01 DIAGNOSIS — G4733 Obstructive sleep apnea (adult) (pediatric): Secondary | ICD-10-CM

## 2018-11-01 DIAGNOSIS — Z9989 Dependence on other enabling machines and devices: Secondary | ICD-10-CM

## 2018-11-01 DIAGNOSIS — I5032 Chronic diastolic (congestive) heart failure: Secondary | ICD-10-CM | POA: Diagnosis not present

## 2018-11-01 MED ORDER — LOVASTATIN 40 MG PO TABS: 40.0000 mg | ORAL_TABLET | Freq: Every day | ORAL | 0 refills | Status: DC

## 2018-11-01 MED ORDER — LISINOPRIL-HYDROCHLOROTHIAZIDE 20-25 MG PO TABS: 1.0000 | ORAL_TABLET | Freq: Every day | ORAL | 0 refills | Status: DC

## 2018-11-01 NOTE — Progress Notes (Signed)
Virtual Visit via Telephone Note   This visit type was conducted due to national recommendations for restrictions regarding the COVID-19 Pandemic (e.g. social distancing) in an effort to limit this patient's exposure and mitigate transmission in our community.  Due to his co-morbid illnesses, this patient is at least at moderate risk for complications without adequate follow up.  This format is felt to be most appropriate for this patient at this time.  The patient did not have access to video technology/had technical difficulties with video requiring transitioning to audio format only (telephone).  All issues noted in this document were discussed and addressed.  No physical exam could be performed with this format.  Please refer to the patient's chart for his  consent to telehealth for [facility].   Evaluation Performed:  Follow-up visit  Date:  11/03/2018   ID:  Brandon Marks, DOB October 24, 1955, MRN [id number]  Patient Location: Home  Provider Location: Home  PCP:  Tamsen Roers, MD  Cardiologist:  Sanda Klein, MD  Electrophysiologist:  None   Chief Complaint:  followup  History of Present Illness:    Brandon Marks is a 63 y.o. male who presents via audio/video conferencing for a telehealth visit today.    He is a 63 year old male with past medical history of HTN, chronic diastolic heart failure, HLD, OSA and morbid obesity.  He had normal coronaries by angiography in 2010 after a false positive nuclear stress test in anticipation of bariatric surgery.  He has functional class II exertional dyspnea and chronic moderate lower extremity edema. Last stress test in June 2018 was low risk and negative ischemia.  He was last seen by Richardson Dopp, PA-C on 09/09/2017 for preoperative clearance.  He was cleared to proceed with gastric bypass surgery without further work-up.  The surgery was performed in February 2019.  He was readmitted in June 2019 with necrotizing fasciitis of the right  buttock.  He underwent incision and drainage with debridement of the area and treated with antibiotic for septic shock.  A tick was found on him in the OR as well, he was prophylactically treated with 7-day course of doxycycline.  He had acute kidney injury as result of septic shock on arrival with creatinine of 2.1, this improved to 0.89 after fluid hydration.  Patient was contacted today through telephone visit.  He denies any obvious chest pain, shortness of breath, lower extremity edema, orthopnea or PND.  He has been compliant with his blood pressure medication however he did run out of his cholesterol medication.  I will refill both.  According to the patient, he was quite happy with the gastric bypass surgery, he is eating less and lost about 150 pounds.  His current weight is about 232 pounds.  Unfortunately, he does not have a blood pressure cuff at home, therefore unable to give me any vital signs today other than his weight.  Overall, I think he is stable from cardiology perspective.  The patient does not have symptoms concerning for COVID-19 infection (fever, chills, cough, or new shortness of breath).    Past Medical History:  Diagnosis Date   Arthritis    Herniated disc    High cholesterol    Hypertension    Morbid obesity ([place])    PAC (premature atrial contraction)    Sleep apnea    non compliant with c-pap   Past Surgical History:  Procedure Laterality Date   CARDIAC CATHETERIZATION  02/20/2009   normal coronary arteries  INCISION AND DRAINAGE PERIRECTAL ABSCESS N/A 01/02/2018   Procedure: IRRIGATION AND DEBRIDEMENT PERIRECTAL ABSCESS;  Surgeon: Coralie Keens, MD;  Location: [facility];  Service: General;  Laterality: N/A;   IRRIGATION AND DEBRIDEMENT BUTTOCKS N/A 01/05/2018   Procedure: DEBRIDEMENT BUTTOCKS ABSCESS;  Surgeon: Georganna Skeans, MD;  Location: [facility];  Service: General;  Laterality: N/A;   KNEE ARTHROSCOPY  2010   Lt   NM [place]   09/28/2007   small area of reversibility in the anterolateral wall at the apex concerning for ischemia   SHOULDER ARTHROSCOPY  4/12   Rt   WOUND DEBRIDEMENT N/A 01/04/2018   Procedure: IRRIGATION AND DEBRIDEMENT OF BUTTOCKS AND REMOVAL OF TICK FROM RIGHT TESTICLE;  Surgeon: Georganna Skeans, MD;  Location: [facility];  Service: General;  Laterality: N/A;     Current Meds  Medication Sig   HYDROcodone-acetaminophen (NORCO/VICODIN) 5-325 MG tablet Take 1-2 tablets by mouth every 4 (four) hours as needed for moderate pain.   levocetirizine (XYZAL) 5 MG tablet Take 5 mg by mouth daily.   lisinopril-hydrochlorothiazide (PRINZIDE,ZESTORETIC) 20-25 MG tablet Take 1 tablet by mouth daily.   lovastatin (MEVACOR) 40 MG tablet Take 1 tablet (40 mg total) by mouth at bedtime. MUST KEEP APPOINTMENT 09/30/18 WITH Kelie Gainey, PA FOR FUTURE REFILLS   omeprazole (PRILOSEC) 20 MG capsule Take 1 capsule by mouth daily.   [DISCONTINUED] lisinopril-hydrochlorothiazide (PRINZIDE,ZESTORETIC) 20-25 MG tablet Take 1 tablet by mouth daily.   [DISCONTINUED] lovastatin (MEVACOR) 40 MG tablet Take 1 tablet (40 mg total) by mouth at bedtime. MUST KEEP APPOINTMENT 09/30/18 WITH Amrita Radu, PA FOR FUTURE REFILLS     Allergies:   Codeine and Oxycodone   Social History   Tobacco Use   Smoking status: Never Smoker   Smokeless tobacco: Never Used  Substance Use Topics   Alcohol use: No   Drug use: No     Family Hx: [name] family history includes Alzheimer's disease in his maternal grandfather and maternal grandmother; Heart failure in his father and mother; Hypertension in his mother.  ROS:   Please see the history of present illness.     All other systems reviewed and are negative.   Prior CV studies:   The following studies were reviewed today:  Myoview 12/31/2016 Study Highlights     Nuclear stress EF: 62%.  There was no ST segment deviation noted during stress.  This is a low risk study.  The  left ventricular ejection fraction is normal (55-65%).   1. EF 62%, normal wall motion.  2. Fixed small, mild mid anteroseptal and apical septal perfusion defect.  No evidence for ischemia.  Given normal wall motion, most likely attenuation.  Cannot rule out prior infarction.   Low risk study.      Labs/Other Tests and Data Reviewed:    EKG:  An ECG dated 01/03/2018 was personally reviewed today and demonstrated:  Normal sinus rhythm  Recent Labs: 01/03/2018: TSH 0.949 01/04/2018: Magnesium 1.6 01/09/2018: ALT 15 01/11/2018: BUN 15; Creatinine, Ser 0.89; Hemoglobin 10.3; Platelets 353; Potassium 3.8; Sodium 139   Recent Lipid Panel Lab Results  Component Value Date/Time   CHOL 132 08/30/2013 11:04 AM    Wt Readings from Last 3 Encounters:  01/05/18 (!) 342 lb 13 oz (155.5 kg)  09/09/17 (!) 380 lb 1.9 oz (172.4 kg)  12/30/16 (!) 398 lb (180.5 kg)     Objective:    Vital Signs:  There were no vitals taken for this visit.  Well nourished, well developed male in no acute distress.   ASSESSMENT & PLAN:    1. Chronic diastolic heart failure: He denies any heart failure symptoms.  He has lost about 150 pounds.  I will continue him on the current blood pressure medication.  2. Hypertension: He will need home blood pressure check.  Unfortunately, he was unable to give me any vital signs today.  He denies any obvious dizziness, blurred vision or feeling of passing out.  I did refill his medication, however instructed him to recheck his blood pressure.  Systolic blood pressure goal between 110 - 130s.  3. Hyperlipidemia: He did run out of lovastatin, I have refilled this prescription.  4. Obstructive sleep apnea: According to patient, he has not been very compliant with his CPAP machine.  He plans to resume his CPAP machine at this time.  Once COVID-19 situation is over, he may eventually need repeat sleep study at some point since he lost about 150 pounds after gastric bypass  surgery  5. Morbid obesity: He has lost close to 150 pounds after gastric bypass surgery.  His current weight is 232 pounds.  COVID-19 Education: The signs and symptoms of COVID-19 were discussed with the patient and how to seek care for testing (follow up with PCP or arrange E-visit).  The importance of social distancing was discussed today.  Time:   Today, I have spent 12 minutes with the patient with telehealth technology discussing the above problems.     Medication Adjustments/Labs and Tests Ordered: Current medicines are reviewed at length with the patient today.  Concerns regarding medicines are outlined above.  Tests Ordered: No orders of the defined types were placed in this encounter.  Medication Changes: Meds ordered this encounter  Medications   lovastatin (MEVACOR) 40 MG tablet    Sig: Take 1 tablet (40 mg total) by mouth at bedtime. MUST KEEP APPOINTMENT 09/30/18 WITH Elanah Osmanovic, PA FOR FUTURE REFILLS    Dispense:  90 tablet    Refill:  0   lisinopril-hydrochlorothiazide (PRINZIDE,ZESTORETIC) 20-25 MG tablet    Sig: Take 1 tablet by mouth daily.    Dispense:  90 tablet    Refill:  0    Disposition:  Follow up in 3 month(s)  Signed, Almyra Deforest, PA  11/03/2018 11:30 PM    [facility]

## 2018-11-01 NOTE — Patient Instructions (Signed)
Medication Instructions:   Your physician recommends that you continue on your current medications as directed. Please refer to the Current Medication list given to you today.  If you need a refill on your cardiac medications before your next appointment, please call your pharmacy.   Lab work:  NONE ordered at this time of appointment   If you have labs (blood work) drawn today and your tests are completely normal, you will receive your results only by: Marland Kitchen MyChart Message (if you have MyChart) OR . A paper copy in the mail If you have any lab test that is abnormal or we need to change your treatment, we will call you to review the results.  Testing/Procedures:  NONE ordered at this time of appointment   Follow-Up: At [facility], you and your health needs are our priority.  As part of our continuing mission to provide you with exceptional heart care, we have created designated Provider Care Teams.  These Care Teams include your primary Cardiologist (physician) and Advanced Practice Providers (APPs -  Physician Assistants and Nurse Practitioners) who all work together to provide you with the care you need, when you need it. You will need a follow up appointment in 3 months.  Please call our office 3-4 months in advance to schedule this appointment.  You may see Sanda Klein, MD or one of the following Advanced Practice Providers on your designated Care Team: [place], [place] . Fabian Sharp, PA-C  Any Other Special Instructions Will Be Listed Below (If Applicable).

## 2018-11-15 ENCOUNTER — Ambulatory Visit: Payer: Medicare HMO | Admitting: Family Medicine

## 2018-12-24 ENCOUNTER — Other Ambulatory Visit: Payer: Self-pay | Admitting: Physician Assistant

## 2018-12-27 ENCOUNTER — Ambulatory Visit: Payer: Medicare HMO | Attending: Internal Medicine | Admitting: Internal Medicine

## 2018-12-27 ENCOUNTER — Encounter: Payer: Self-pay | Admitting: Internal Medicine

## 2018-12-27 ENCOUNTER — Other Ambulatory Visit: Payer: Self-pay

## 2018-12-27 VITALS — BP 103/68 | HR 71 | Temp 97.9°F | Resp 16 | Ht 73.0 in | Wt 239.6 lb

## 2018-12-27 DIAGNOSIS — G4733 Obstructive sleep apnea (adult) (pediatric): Secondary | ICD-10-CM

## 2018-12-27 DIAGNOSIS — E785 Hyperlipidemia, unspecified: Secondary | ICD-10-CM

## 2018-12-27 DIAGNOSIS — E669 Obesity, unspecified: Secondary | ICD-10-CM

## 2018-12-27 DIAGNOSIS — H6121 Impacted cerumen, right ear: Secondary | ICD-10-CM | POA: Diagnosis not present

## 2018-12-27 DIAGNOSIS — I10 Essential (primary) hypertension: Secondary | ICD-10-CM | POA: Diagnosis not present

## 2018-12-27 DIAGNOSIS — R269 Unspecified abnormalities of gait and mobility: Secondary | ICD-10-CM | POA: Diagnosis not present

## 2018-12-27 DIAGNOSIS — Z9884 Bariatric surgery status: Secondary | ICD-10-CM | POA: Diagnosis not present

## 2018-12-27 DIAGNOSIS — D6489 Other specified anemias: Secondary | ICD-10-CM

## 2018-12-27 DIAGNOSIS — D649 Anemia, unspecified: Secondary | ICD-10-CM | POA: Diagnosis not present

## 2018-12-27 DIAGNOSIS — Z9989 Dependence on other enabling machines and devices: Secondary | ICD-10-CM

## 2018-12-27 DIAGNOSIS — R05 Cough: Secondary | ICD-10-CM

## 2018-12-27 DIAGNOSIS — R059 Cough, unspecified: Secondary | ICD-10-CM

## 2018-12-27 DIAGNOSIS — H9311 Tinnitus, right ear: Secondary | ICD-10-CM | POA: Insufficient documentation

## 2018-12-27 DIAGNOSIS — M170 Bilateral primary osteoarthritis of knee: Secondary | ICD-10-CM

## 2018-12-27 NOTE — Progress Notes (Signed)
Patient ID: Brandon Marks, male    DOB: [birth year]  MRN: [id number]  CC: New Patient (Initial Visit) and Hypertension   Subjective: Brandon Marks is a 63 y.o. male who presents to est care for chronic ds management. His concerns today include:  Pt with hx of HTN, dCHF, OSA on CPAP, HL, morbid obesity, gastric bypass, normocytic anemia   Previous PCP is at [facility] - Dr. Tamsen Roers.  This physician is in his 64s and is about to retire so patient decided to change providers.    HYPERTENSION Currently taking: see medication list Med Adherence: [x]  Yes    []  No Medication side effects: []  Yes    [x]  No Adherence with salt restriction: [x]  Yes    []  No Home Monitoring?: []  Yes    [x]  No but has a device Monitoring Frequency: []  Yes    []  No Home BP results range: []  Yes    []  No SOB? []  Yes    [x]  No Chest Pain?:  1 episode yesterday in epigastric area that lasted about 1-2 mins.  No radiation. Ate a burrito  Leg swelling?: []  Yes    [x]  No Headaches?: [x]  Yes, occasionally    []  No Dizziness? [x]  Yes, occasionally    []  No Comments:  Walks daily for 2-5 minutes several times a day.  Feels his balance a little off for past 2 yrs. Never mentioned it to his PCP.  No falls in the past 1 yr.  No numbness or tingling in the feet.  Has a cane but does not use it.  Takes it with him only when he anticipates doing a lot of walking.  Would like to be able to walk more.  diaCHF: Patient has history of diastolic CHF on his chart.  However patient states that he was not aware that he has a diagnosis of heart failure.  I looked back through his chart.  He was seeing Dr. Sallyanne Kuster back in 2015 and 16.  His note states that patient has hypertensive heart disease with diastolic dysfunction and right heart failure related to obesity and OSA.  Patient had nuclear stress test done in 2018 as a part of presurgical clearance for weight loss surgery.  That study came back low risk with good  EF.  Obesity:  Had wgh loss surgery 07/2017 at [facility].  Started at 400 lbs.  Wgh has stablized to where it is at now for past 2-3 mths -eats several x/day in small portions -has MV, Vit D, Iron and Vit B 12  but not consistent in taking every day.  Takes them probably 3 x a wk -has not been back for f/u with [facility] in almost a yr. he feels they just want to take his money.  HL:  Compliant with Lovastatin  OSA:  Not using CPAP consistently.  Lays on couch and fall asleep and forgets to put it on Admits to daytime sleepiness;  usually takes a nap around 2-3 o'clock each day Not sure if he snores Last sleep study about 3 yrs ago.  He wonders whether he still has sleep apnea given the significant weight loss that he has had.  C/o continuous tinnitis in RT ear for yrs.  "Sounds like crickets in there."  Saw ENT yrs ago for same and told there was nothing they can do.  Endorses decreased hearing in the right ear.  He thinks he may have wax buildup.  c/o intermittent dry cough x couple mths.  No significant drainage at the back of the throat.  No wheezing.  He is on lisinopril HCTZ.  HM: c-scope 2 yrs ago which he states came back okay.    Past surgical history, family history, social history were updated and reviewed.  Patient Active Problem List   Diagnosis Date Noted  . S/P gastric bypass 09/14/2017  . Diastolic heart failure ([place]) 09/11/2017  . DDD (degenerative disc disease), cervical 09/04/2017  . Super obesity 01/20/2015  . High cholesterol 01/20/2015  . Chest pain 02/27/2012  . Obesity, morbid ([place]) 02/27/2012  . Dyspnea, chronic DOE 02/27/2012  . Herniated disc   . Hyperlipidemia 02/23/2009  . 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 Essential hypertension 10/18/2007  . Obstructive sleep apnea 10/11/2007     Current Outpatient Medications on File Prior to Visit  Medication Sig Dispense Refill  . levocetirizine (XYZAL) 5 MG  tablet Take 5 mg by mouth daily.  0  . lisinopril-hydrochlorothiazide (ZESTORETIC) 20-25 MG tablet TAKE 1 TABLET EVERY DAY 90 tablet 3  . lovastatin (MEVACOR) 40 MG tablet TAKE 1 TABLET AT BEDTIME 90 tablet 3  . omeprazole (PRILOSEC) 20 MG capsule Take 1 capsule by mouth daily.  1   No current facility-administered medications on file prior to visit.     Allergies  Allergen Reactions  . Codeine Nausea And Vomiting  . Oxycodone Other (See Comments)    Upset GI    Social History   Socioeconomic History  . Marital status: Divorced    Spouse name: Not on file  . Number of children: 0  . Years of education: some college  . Highest education level: Not on file  Occupational History  . Occupation: Retired  Scientific laboratory technician  . Financial resource strain: Not on file  . Food insecurity:    Worry: Not on file    Inability: Not on file  . Transportation needs:    Medical: Not on file    Non-medical: Not on file  Tobacco Use  . Smoking status: Never Smoker  . Smokeless tobacco: Never Used  Substance and Sexual Activity  . Alcohol use: No  . Drug use: No  . Sexual activity: Not on file  Lifestyle  . Physical activity:    Days per week: Not on file    Minutes per session: Not on file  . Stress: Not on file  Relationships  . Social connections:    Talks on phone: Not on file    Gets together: Not on file    Attends religious service: Not on file    Active member of club or organization: Not on file    Attends meetings of clubs or organizations: Not on file    Relationship status: Not on file  . Intimate partner violence:    Fear of current or ex partner: Not on file    Emotionally abused: Not on file    Physically abused: Not on file    Forced sexual activity: Not on file  Other Topics Concern  . Not on file  Social History Narrative   Denies caffeine use     Family History  Problem Relation Age of Onset  . Hypertension Mother   . Heart failure Mother   . Alzheimer's  disease Mother   . Skin cancer Mother   . Heart disease Father   . Alzheimer's disease Maternal Grandmother   . Alzheimer's disease Maternal Grandfather     Past Surgical  History:  Procedure Laterality Date  . CARDIAC CATHETERIZATION  02/20/2009   normal coronary arteries  . INCISION AND DRAINAGE PERIRECTAL ABSCESS N/A 01/02/2018   Procedure: IRRIGATION AND DEBRIDEMENT PERIRECTAL ABSCESS;  Surgeon: Coralie Keens, MD;  Location: [facility];  Service: General;  Laterality: N/A;  . IRRIGATION AND DEBRIDEMENT BUTTOCKS N/A 01/05/2018   Procedure: DEBRIDEMENT BUTTOCKS ABSCESS;  Surgeon: Georganna Skeans, MD;  Location: [place];  Service: General;  Laterality: N/A;  . KNEE ARTHROSCOPY  2010   Lt  . NM MYOCAR PERF WALL MOTION  09/28/2007   small area of reversibility in the anterolateral wall at the apex concerning for ischemia  . SHOULDER ARTHROSCOPY  4/12   Rt  . WOUND DEBRIDEMENT N/A 01/04/2018   Procedure: IRRIGATION AND DEBRIDEMENT OF BUTTOCKS AND REMOVAL OF TICK FROM RIGHT TESTICLE;  Surgeon: Georganna Skeans, MD;  Location: [place];  Service: General;  Laterality: N/A;    ROS: Review of Systems  Constitutional: Negative for activity change and appetite change.  HENT: Positive for tinnitus. Negative for ear discharge, ear pain and sore throat.   Musculoskeletal: Positive for gait problem.       Gives history of "bone on bone" osteoarthritis in both knees.  Prior to his weight loss surgery he was having significant pain in the knees and was told by Ortho that he would need knee replacement surgery.  However since gastric bypass and weight loss, his knees have not bothered him as much as they did prior.    PHYSICAL EXAM: BP 103/68   Pulse 71   Temp 97.9 F (36.6 C) (Oral)   Resp 16   Ht 6\' 1"  (1.854 m)   Wt 239 lb 9.6 oz (108.7 kg)   SpO2 98%   BMI 31.61 kg/m   Wt Readings from Last 3 Encounters:  12/27/18 239 lb 9.6 oz (108.7 kg)  01/05/18 (!) 342 lb 13 oz (155.5 kg)  09/09/17 (!) 380  lb 1.9 oz (172.4 kg)   Physical Exam General appearance - alert, well appearing, older caucasian male and in no distress Mental status - normal mood, behavior, speech, dress, motor activity, and thought processes Eyes - pupils equal and reactive, extraocular eye movements intact.  Pink conjunctiva Ears -impacted cerumen in right ear canal.  Left ear canal and tympanic membranes are within normal limits Nose - normal and patent, no erythema, discharge or polyps Mouth -no oral lesions noted.  Throat not well visualized due to patient gagging when tongue compresses applied.   Neck - supple, no significant adenopathy, no thyroid enlargement.  He does have loose skin between the chin and the trachea Chest -slightly diminished but clear bilaterally Heart -heart sounds are muffled but regular.  No murmurs heard. Neurological - cranial nerves II through XII intact, motor and sensory grossly normal bilaterally, Romberg sign negative.  Waddling gait with trunk leaning slightly forward.  Noted to have somewhat of a limp on the right side Musculoskeletal -patient is bowlegged.  Knee joints are enlarged.  Mild crepitus on passive movement. Extremities -trace bilateral lower extremity edema  skin -some excoriated areas noted on the dorsal forearm.  Sun damaged areas noted on the forearms dorsal surface.  Patient is bald plated Breasts: Mild gynecomastia  CMP Latest Ref Rng & Units 01/11/2018 01/10/2018 01/09/2018  Glucose 65 - 99 mg/dL 84 94 94  BUN 6 - 20 mg/dL 15 16 17   Creatinine 0.61 - 1.24 mg/dL 0.89 1.00 1.06  Sodium 135 - 145 mmol/L 139 139 140  Potassium 3.5 - 5.1 mmol/L 3.8 3.7 3.5  Chloride 101 - 111 mmol/L 105 106 107  CO2 22 - 32 mmol/L 28 29 29   Calcium 8.9 - 10.3 mg/dL 8.1(L) 8.1(L) 8.3(L)  Total Protein 6.5 - 8.1 g/dL - - 5.0(L)  Total Bilirubin 0.3 - 1.2 mg/dL - - 0.7  Alkaline Phos 38 - 126 U/L - - 50  AST 15 - 41 U/L - - 16  ALT 17 - 63 U/L - - 15(L)   Lipid Panel     Component  Value Date/Time   CHOL 132 08/30/2013 1104    CBC    Component Value Date/Time   WBC 19.5 (H) 01/11/2018 0500   RBC 3.52 (L) 01/11/2018 0500   HGB 10.3 (L) 01/11/2018 0500   HCT 30.5 (L) 01/11/2018 0500   PLT 353 01/11/2018 0500   MCV 86.6 01/11/2018 0500   MCH 29.3 01/11/2018 0500   MCHC 33.8 01/11/2018 0500   RDW 15.8 (H) 01/11/2018 0500   LYMPHSABS 0.7 01/01/2018 1835   MONOABS 0.6 01/01/2018 1835   EOSABS 0.0 01/01/2018 1835   BASOSABS 0.2 (H) 01/01/2018 1835    ASSESSMENT AND PLAN: 1. Essential hypertension Blood pressure control.  We discussed changing lisinopril/HCTZ to Cozaar/HCTZ due to the dry cough.  Patient declined stating that the cough is not that bothersome.  Advised to continue low-salt diet. - CBC - Comprehensive metabolic panel  2. Obesity (BMI 30-39.9) 3. History of Roux-en-Y gastric bypass Commended him on weight loss.  Encouraged him to continue healthy eating habits and trying to walk as much as he can. -Discussed the importance of taking the multivitamin, vitamin D, iron and vitamin B12 consistently every day to prevent developing deficiencies as a result of the type of gastric bypass surgery that he had.  Encouraged him to get a med box that he can fill once a week. - CBC - Vitamin B12 - VITAMIN D 25 Hydroxy (Vit-D Deficiency, Fractures)  4. Hyperlipidemia, unspecified hyperlipidemia type - Lipid panel  5. OSA on CPAP -We will refer him for home sleep study.  Patient wants to be reevaluated to see whether he still has sleep apnea given his weight loss.  In the meantime I have encouraged him to use the CPAP machine given that he still has some daytime sleepiness - Home sleep test  6. Impacted cerumen of right ear My CMA was successful in flushing the right ear  7. Tinnitus of right ear Advised patient that this is a symptom that is difficult to treat.  He declines referral back to ENT  8. Gait disturbance We will get him in for some physical  therapy.  Patient is wondering whether he should also go back to orthopedics for his knees.  We will hold off on that for now since they are not bothering him as much - Ambulatory referral to Physical Therapy - Vitamin B12  9. Normochromic anemia - CBC - Iron, TIBC and Ferritin Panel - Vitamin B12  10. Primary osteoarthritis of both knees See #8 above  11. Cough See #1 above  I spent more than 30 minutes in direct face-to-face contact with this patient discussing diagnosis, treatment and coordinating care Patient was given the opportunity to ask questions.  Patient verbalized understanding of the plan and was able to repeat key elements of the plan.     Requested Prescriptions    No prescriptions requested or ordered in this encounter    Return in about 2 months (around 02/26/2019).  Karle Plumber, MD, FACP

## 2018-12-27 NOTE — Patient Instructions (Addendum)
Please sign a release for me to get your medical records from your previous PCP.  Please start taking your multivitamin and other vitamin supplements that were recommended by your weight loss clinic.  It is very important that you take these every day to prevent deficiencies that can occur due to the type of bypass surgery that you had.  You have been referred for physical therapy.  Consider using your cane consistently.  You have been referred for a home sleep study.

## 2018-12-28 LAB — COMPREHENSIVE METABOLIC PANEL
ALT: 17 IU/L (ref 0–44)
AST: 14 IU/L (ref 0–40)
Albumin/Globulin Ratio: 2.1 (ref 1.2–2.2)
Albumin: 4.1 g/dL (ref 3.8–4.8)
Alkaline Phosphatase: 57 IU/L (ref 39–117)
BUN/Creatinine Ratio: 24 (ref 10–24)
BUN: 23 mg/dL (ref 8–27)
Bilirubin Total: 0.7 mg/dL (ref 0.0–1.2)
CO2: 27 mmol/L (ref 20–29)
Calcium: 9.4 mg/dL (ref 8.6–10.2)
Chloride: 105 mmol/L (ref 96–106)
Creatinine, Ser: 0.97 mg/dL (ref 0.76–1.27)
GFR calc Af Amer: 95 mL/min/{1.73_m2} (ref >59)
GFR calc non Af Amer: 82 mL/min/{1.73_m2} (ref >59)
Globulin, Total: 2 g/dL (ref 1.5–4.5)
Glucose: 78 mg/dL (ref 65–99)
Potassium: 4 mmol/L (ref 3.5–5.2)
Sodium: 143 mmol/L (ref 134–144)
Total Protein: 6.1 g/dL (ref 6.0–8.5)

## 2018-12-28 LAB — CBC
Hematocrit: 38 % (ref 37.5–51.0)
Hemoglobin: 13 g/dL (ref 13.0–17.7)
MCH: 30.8 pg (ref 26.6–33.0)
MCHC: 34.2 g/dL (ref 31.5–35.7)
MCV: 90 fL (ref 79–97)
Platelets: 283 10*3/uL (ref 150–450)
RBC: 4.22 x10E6/uL (ref 4.14–5.80)
RDW: 12.8 % (ref 11.6–15.4)
WBC: 6.7 10*3/uL (ref 3.4–10.8)

## 2018-12-28 LAB — IRON,TIBC AND FERRITIN PANEL
Ferritin: 232 ng/mL (ref 30–400)
Iron Saturation: 51 % (ref 15–55)
Iron: 110 ug/dL (ref 38–169)
Total Iron Binding Capacity: 214 ug/dL — ABNORMAL LOW (ref 250–450)
UIBC: 104 ug/dL — ABNORMAL LOW (ref 111–343)

## 2018-12-28 LAB — LIPID PANEL
Chol/HDL Ratio: 2 ratio (ref 0.0–5.0)
Cholesterol, Total: 99 mg/dL — ABNORMAL LOW (ref 100–199)
HDL: 49 mg/dL (ref >39)
LDL Calculated: 41 mg/dL (ref 0–99)
Triglycerides: 43 mg/dL (ref 0–149)
VLDL Cholesterol Cal: 9 mg/dL (ref 5–40)

## 2018-12-28 LAB — VITAMIN B12: Vitamin B-12: 256 pg/mL (ref 232–1245)

## 2018-12-28 LAB — VITAMIN D 25 HYDROXY (VIT D DEFICIENCY, FRACTURES): Vit D, 25-Hydroxy: 35 ng/mL (ref 30.0–100.0)

## 2018-12-29 ENCOUNTER — Telehealth: Payer: Self-pay | Admitting: *Deleted

## 2018-12-29 NOTE — Telephone Encounter (Signed)
Medical Assistant left message on patient's home and cell voicemail. Voicemail states to give a call back to [place] with [facility] at [phone]. Patient is aware of no longer being anemic, liver,kidney and cholesterol being normal and needing to take an OTC vitamin B12 and vitamin d

## 2018-12-29 NOTE — Telephone Encounter (Signed)
-----   Message from Ladell Pier, MD sent at 12/28/2018  7:59 AM EDT ----- Let patient know that his blood count is normal meaning that he no longer is anemic.  His iron level is good.  Vitamin B12 level is low.  Please take vitamin B12 supplement 500 to 1000 mcg daily.  This can be purchased over-the-counter.  Kidney and liver function tests are normal.  Cholesterol level is normal.  Vitamin D is in the low normal range.

## 2019-02-21 ENCOUNTER — Other Ambulatory Visit: Payer: Self-pay

## 2019-02-21 ENCOUNTER — Ambulatory Visit (HOSPITAL_BASED_OUTPATIENT_CLINIC_OR_DEPARTMENT_OTHER): Payer: Medicare HMO | Admitting: Internal Medicine

## 2019-02-21 VITALS — Ht 73.0 in | Wt 239.0 lb

## 2019-02-28 ENCOUNTER — Encounter: Payer: Self-pay | Admitting: Internal Medicine

## 2019-02-28 ENCOUNTER — Ambulatory Visit: Payer: Medicare HMO | Attending: Internal Medicine | Admitting: Internal Medicine

## 2019-02-28 DIAGNOSIS — I10 Essential (primary) hypertension: Secondary | ICD-10-CM | POA: Diagnosis not present

## 2019-02-28 DIAGNOSIS — I5032 Chronic diastolic (congestive) heart failure: Secondary | ICD-10-CM | POA: Diagnosis not present

## 2019-02-28 DIAGNOSIS — Z9989 Dependence on other enabling machines and devices: Secondary | ICD-10-CM | POA: Diagnosis not present

## 2019-02-28 DIAGNOSIS — R0981 Nasal congestion: Secondary | ICD-10-CM | POA: Diagnosis not present

## 2019-02-28 DIAGNOSIS — G4733 Obstructive sleep apnea (adult) (pediatric): Secondary | ICD-10-CM | POA: Diagnosis not present

## 2019-02-28 DIAGNOSIS — R269 Unspecified abnormalities of gait and mobility: Secondary | ICD-10-CM

## 2019-02-28 DIAGNOSIS — E669 Obesity, unspecified: Secondary | ICD-10-CM | POA: Diagnosis not present

## 2019-02-28 MED ORDER — FLUTICASONE PROPIONATE 50 MCG/ACT NA SUSP: 1.0000 | Freq: Every day | NASAL | 0 refills | Status: DC

## 2019-02-28 NOTE — Progress Notes (Signed)
Virtual Visit via Telephone Note Due to current restrictions/limitations of in-office visits due to the COVID-19 pandemic, this scheduled clinical appointment was converted to a telehealth visit  I connected with Brandon Marks on 02/28/19 at 1:52 p.m by telephone and verified that I am speaking with the correct person using two identifiers. I am in my office.  The patient is at home.  Only the patient and myself participated in this encounter.  I discussed the limitations, risks, security and privacy concerns of performing an evaluation and management service by telephone and the availability of in person appointments. I also discussed with the patient that there may be a patient responsible charge related to this service. The patient expressed understanding and agreed to proceed.   History of Present Illness: Pt with hx of HTN, dCHF, OSA on CPAP, HL, morbid obesity, gastric bypass, normocytic anemia,  wgh loss surgery (Roux-en-Y)07/2017 at [facility]).  Patient last evaluated 12/2018   Dealing with some sinus congestion x several wks.  Stopped up on RT side.  No drainage in throat.  No fever.  HYPERTENSION/Diastolic dysfuction Currently taking: see medication list Med Adherence: [x]  Yes    []  No Medication side effects: []  Yes    [x]  No Adherence with salt restriction: [x]  Yes    []  No Home Monitoring?: []  Yes    [x]  No Monitoring Frequency: []  Yes    []  No Home BP results range: []  Yes    []  No SOB? []  Yes    [x]  No Chest Pain?: []  Yes    [x]  No Leg swelling?: []  Yes    [x]  No Headaches?: []  Yes    [x]  No Dizziness? []  Yes    [x]  No Comments:   Obesity: Weight continues to decrease.  Down to 230 lbs, which is 10 pounds less than what he was when we saw him last.  Vitamin B12 level was in the low normal range and vitamin D was also low normal.  I had recommended that he purchased vitamin B12 over-the-counter and start taking 500 to 1000 mcg daily.  He reports compliance with taking  his vitamins.    OSA:  Did home sleep study but equipment fell out of nose during sleep so a new study has been scheduled.  HL: Reports compliance with Mevacor.   Outpatient Encounter Medications as of 02/28/2019  Medication Sig  . levocetirizine (XYZAL) 5 MG tablet Take 5 mg by mouth daily.  Marland Kitchen lisinopril-hydrochlorothiazide (ZESTORETIC) 20-25 MG tablet TAKE 1 TABLET EVERY DAY  . lovastatin (MEVACOR) 40 MG tablet TAKE 1 TABLET AT BEDTIME  . omeprazole (PRILOSEC) 20 MG capsule Take 1 capsule by mouth daily.   No facility-administered encounter medications on file as of 02/28/2019.     Observations/Objective: Results for orders placed or performed in visit on 12/27/18  CBC  Result Value Ref Range   WBC 6.7 3.4 - 10.8 x10E3/uL   RBC 4.22 4.14 - 5.80 x10E6/uL   Hemoglobin 13.0 13.0 - 17.7 g/dL   Hematocrit 38.0 37.5 - 51.0 %   MCV 90 79 - 97 fL   MCH 30.8 26.6 - 33.0 pg   MCHC 34.2 31.5 - 35.7 g/dL   RDW 12.8 11.6 - 15.4 %   Platelets 283 150 - 450 x10E3/uL  Comprehensive metabolic panel  Result Value Ref Range   Glucose 78 65 - 99 mg/dL   BUN 23 8 - 27 mg/dL   Creatinine, Ser 0.97 0.76 - 1.27 mg/dL   GFR calc  non Af Amer 82 >59 mL/min/1.73   GFR calc Af Amer 95 >59 mL/min/1.73   BUN/Creatinine Ratio 24 10 - 24   Sodium 143 134 - 144 mmol/L   Potassium 4.0 3.5 - 5.2 mmol/L   Chloride 105 96 - 106 mmol/L   CO2 27 20 - 29 mmol/L   Calcium 9.4 8.6 - 10.2 mg/dL   Total Protein 6.1 6.0 - 8.5 g/dL   Albumin 4.1 3.8 - 4.8 g/dL   Globulin, Total 2.0 1.5 - 4.5 g/dL   Albumin/Globulin Ratio 2.1 1.2 - 2.2   Bilirubin Total 0.7 0.0 - 1.2 mg/dL   Alkaline Phosphatase 57 39 - 117 IU/L   AST 14 0 - 40 IU/L   ALT 17 0 - 44 IU/L  Lipid panel  Result Value Ref Range   Cholesterol, Total 99 (L) 100 - 199 mg/dL   Triglycerides 43 0 - 149 mg/dL   HDL 49 >39 mg/dL   VLDL Cholesterol Cal 9 5 - 40 mg/dL   LDL Calculated 41 0 - 99 mg/dL   Chol/HDL Ratio 2.0 0.0 - 5.0 ratio  Iron, TIBC and  Ferritin Panel  Result Value Ref Range   Total Iron Binding Capacity 214 (L) 250 - 450 ug/dL   UIBC 104 (L) 111 - 343 ug/dL   Iron 110 38 - 169 ug/dL   Iron Saturation 51 15 - 55 %   Ferritin 232 30 - 400 ng/mL  Vitamin B12  Result Value Ref Range   Vitamin B-12 256 232 - 1,245 pg/mL  VITAMIN D 25 Hydroxy (Vit-D Deficiency, Fractures)  Result Value Ref Range   Vit D, 25-Hydroxy 35.0 30.0 - 100.0 ng/mL     Assessment and Plan: 1. Essential hypertension -Continue lisinopril/HCTZ and low-salt diet.  2. Obesity (BMI 30-39.9) Commended him on continued weight loss.  Encouraged him to continue eating smaller portions.  Stressed the importance of taking his vitamins as was recommended post his Roux-en-Y gastric bypass surgery.  Recent blood test revealed vitamin D and B12 for in the low normal range.  I stressed the importance of taking those supplements  3. OSA on CPAP Waiting for repeat home sleep study to be done  4. Gait disturbance Patient never did receive a call from physical therapy.  Message sent to our referral coordinator to look into this  5. Sinus congestion - fluticasone (FLONASE) 50 MCG/ACT nasal spray; Place 1 spray into both nostrils daily.  Dispense: 16 g; Refill: 0  6.  Chronic diastolic dysfunction -Clinically stable.   Follow Up Instructions: Follow-up in 4 months   I discussed the assessment and treatment plan with the patient. The patient was provided an opportunity to ask questions and all were answered. The patient agreed with the plan and demonstrated an understanding of the instructions.   The patient was advised to call back or seek an in-person evaluation if the symptoms worsen or if the condition fails to improve as anticipated.  I provided 10 minutes of non-face-to-face time during this encounter.   Karle Plumber, MD

## 2019-03-03 ENCOUNTER — Telehealth: Payer: Self-pay | Admitting: Internal Medicine

## 2019-03-03 NOTE — Telephone Encounter (Signed)
Attempted to reach patient no answer lvm to call back

## 2019-03-03 NOTE — Telephone Encounter (Signed)
-----   Message from Jackelyn Knife, [place] sent at 02/28/2019  2:41 PM EDT ----- Please contact pt and schedule a 4 month in person f/u(morning)

## 2019-03-10 ENCOUNTER — Ambulatory Visit: Payer: Medicare HMO | Admitting: Cardiovascular Disease

## 2019-03-11 ENCOUNTER — Ambulatory Visit: Payer: Medicare HMO | Admitting: Cardiovascular Disease

## 2019-03-14 ENCOUNTER — Encounter: Payer: Self-pay | Admitting: Physician Assistant

## 2019-03-14 ENCOUNTER — Ambulatory Visit (INDEPENDENT_AMBULATORY_CARE_PROVIDER_SITE_OTHER): Payer: Medicare HMO | Admitting: Physician Assistant

## 2019-03-14 ENCOUNTER — Other Ambulatory Visit: Payer: Self-pay

## 2019-03-14 VITALS — BP 118/71 | HR 72 | Ht 74.0 in | Wt 244.4 lb

## 2019-03-14 DIAGNOSIS — I10 Essential (primary) hypertension: Secondary | ICD-10-CM

## 2019-03-14 DIAGNOSIS — E785 Hyperlipidemia, unspecified: Secondary | ICD-10-CM

## 2019-03-14 DIAGNOSIS — I5032 Chronic diastolic (congestive) heart failure: Secondary | ICD-10-CM | POA: Diagnosis not present

## 2019-03-14 MED ORDER — LOVASTATIN 40 MG PO TABS: 20.0000 mg | ORAL_TABLET | Freq: Every day | ORAL | 3 refills | Status: DC

## 2019-03-14 NOTE — Progress Notes (Signed)
Cardiology Office Note    Date:  03/16/2019   ID:  Brandon Marks, DOB 1955-09-18, MRN [id number]  PCP:  Brandon Pier, MD  Cardiologist:  Dr. Sallyanne Marks  Chief Complaint  Patient presents with  . Follow-up    seen for Dr. Sallyanne Marks.    History of Present Illness:  Brandon Marks is a 63 y.o. male with PMH of HTN, chronic diastolic heart failure, HLD, OSA and morbid obesity.  He had normal coronaries by angiography in 2010 after a false positive nuclear stress test in anticipation of bariatric surgery.  He has functional class II exertional dyspnea and chronic moderate lower extremity edema. Last stress test in June 2018 was low risk and negative ischemia.  He was last seen by Brandon Dopp, PA-C on 09/09/2017 for preoperative clearance.  He was cleared to proceed with gastric bypass surgery without further work-up.  The surgery was performed in February 2019.  He was readmitted in June 2019 with necrotizing fasciitis of the right buttock.  He underwent incision and drainage with debridement of the area and treated with antibiotic for septic shock.  A tick was found on him in the OR as well, he was prophylactically treated with 7-day course of doxycycline.  He had acute kidney injury as result of septic shock on arrival with creatinine of 2.1, this improved to 0.89 after fluid hydration.  Patient presents today for cardiology office visit, he lost about 140 pounds after the bariatric surgery.  He has not had any infection problem.  His blood pressure is very well controlled.  He does not have any lower extremity edema, orthopnea or PND.  He denies any recent chest discomfort or shortness of breath.   Past Medical History:  Diagnosis Date  . Arthritis   . Herniated disc   . High cholesterol   . Hypertension   . Morbid obesity ([place])   . PAC (premature atrial contraction)   . Sleep apnea    non compliant with c-pap    Past Surgical History:  Procedure Laterality Date  . CARDIAC  CATHETERIZATION  02/20/2009   normal coronary arteries  . INCISION AND DRAINAGE PERIRECTAL ABSCESS N/A 01/02/2018   Procedure: IRRIGATION AND DEBRIDEMENT PERIRECTAL ABSCESS;  Surgeon: Coralie Keens, MD;  Location: [place];  Service: General;  Laterality: N/A;  . IRRIGATION AND DEBRIDEMENT BUTTOCKS N/A 01/05/2018   Procedure: DEBRIDEMENT BUTTOCKS ABSCESS;  Surgeon: Georganna Skeans, MD;  Location: [place];  Service: General;  Laterality: N/A;  . KNEE ARTHROSCOPY  2010   Lt  . NM MYOCAR PERF WALL MOTION  09/28/2007   small area of reversibility in the anterolateral wall at the apex concerning for ischemia  . SHOULDER ARTHROSCOPY  4/12   Rt  . WOUND DEBRIDEMENT N/A 01/04/2018   Procedure: IRRIGATION AND DEBRIDEMENT OF BUTTOCKS AND REMOVAL OF TICK FROM RIGHT TESTICLE;  Surgeon: Georganna Skeans, MD;  Location: [place];  Service: General;  Laterality: N/A;    Current Medications: Outpatient Medications Prior to Visit  Medication Sig Dispense Refill  . levocetirizine (XYZAL) 5 MG tablet Take 5 mg by mouth every evening.    Marland Kitchen lisinopril-hydrochlorothiazide (ZESTORETIC) 20-25 MG tablet Take 1 tablet by mouth daily.    . fluticasone (FLONASE) 50 MCG/ACT nasal spray Place 1 spray into both nostrils daily. 16 g 0  . levocetirizine (XYZAL) 5 MG tablet Take 5 mg by mouth daily.  0  . lisinopril-hydrochlorothiazide (ZESTORETIC) 20-25 MG tablet TAKE 1 TABLET EVERY DAY 90 tablet 3  .  lovastatin (MEVACOR) 40 MG tablet TAKE 1 TABLET AT BEDTIME (Patient taking differently: Take 20 mg by mouth. ) 90 tablet 3  . omeprazole (PRILOSEC) 20 MG capsule Take 1 capsule by mouth daily.  1   No facility-administered medications prior to visit.      Allergies:   Codeine and Oxycodone   Social History   Socioeconomic History  . Marital status: Divorced    Spouse name: Not on file  . Number of children: 0  . Years of education: some college  . Highest education level: Not on file  Occupational History  . Occupation:  Retired  Scientific laboratory technician  . Financial resource strain: Not on file  . Food insecurity    Worry: Not on file    Inability: Not on file  . Transportation needs    Medical: Not on file    Non-medical: Not on file  Tobacco Use  . Smoking status: Never Smoker  . Smokeless tobacco: Never Used  Substance and Sexual Activity  . Alcohol use: No  . Drug use: No  . Sexual activity: Not on file  Lifestyle  . Physical activity    Days per week: Not on file    Minutes per session: Not on file  . Stress: Not on file  Relationships  . Social Herbalist on phone: Not on file    Gets together: Not on file    Attends religious service: Not on file    Active member of club or organization: Not on file    Attends meetings of clubs or organizations: Not on file    Relationship status: Not on file  Other Topics Concern  . Not on file  Social History Narrative   Denies caffeine use      Family History:  [name] family history includes Alzheimer's disease in his maternal grandfather, maternal grandmother, and mother; Heart disease in his father; Heart failure in his mother; Hypertension in his mother; Skin cancer in his mother.   ROS:   Please see the history of present illness.    ROS All other systems reviewed and are negative.   PHYSICAL EXAM:   VS:  BP 118/71   Pulse 72   Ht 6\' 2"  (1.88 m)   Wt 244 lb 6.4 oz (110.9 kg)   SpO2 98%   BMI 31.38 kg/m    GEN: Well nourished, well developed, in no acute distress  HEENT: normal  Neck: no JVD, carotid bruits, or masses Cardiac: RRR; no murmurs, rubs, or gallops,no edema  Respiratory:  clear to auscultation bilaterally, normal work of breathing GI: soft, nontender, nondistended, + BS MS: no deformity or atrophy  Skin: warm and dry, no rash Neuro:  Alert and Oriented x 3, Strength and sensation are intact Psych: euthymic mood, full affect  Wt Readings from Last 3 Encounters:  03/14/19 244 lb 6.4 oz (110.9 kg)  02/21/19  239 lb (108.4 kg)  12/27/18 239 lb 9.6 oz (108.7 kg)      Studies/Labs Reviewed:   EKG:  EKG is ordered today.  The ekg ordered today demonstrates sinus bradycardia with PACs.  Recent Labs: 12/27/2018: ALT 17; BUN 23; Creatinine, Ser 0.97; Hemoglobin 13.0; Platelets 283; Potassium 4.0; Sodium 143   Lipid Panel    Component Value Date/Time   CHOL 99 (L) 12/27/2018 1144   TRIG 43 12/27/2018 1144   HDL 49 12/27/2018 1144   CHOLHDL 2.0 12/27/2018 1144   LDLCALC 41 12/27/2018 1144  Additional studies/ records that were reviewed today include:   Myoview 12/31/2016 Study Highlights    Nuclear stress EF: 62%.  There was no ST segment deviation noted during stress.  This is a low risk study.  The left ventricular ejection fraction is normal (55-65%).   1. EF 62%, normal wall motion.  2. Fixed small, mild mid anteroseptal and apical septal perfusion defect.  No evidence for ischemia.  Given normal wall motion, most likely attenuation.  Cannot rule out prior infarction.   Low risk study.     ASSESSMENT:    1. Chronic diastolic (congestive) heart failure ([place])   2. Essential hypertension   3. Hyperlipidemia LDL goal <70   4. Morbid obesity ([place])      PLAN:  In order of problems listed above:  1. Chronic diastolic heart failure: Euvolemic on physical exam.  2. Hypertension: Blood pressure stable.  On lisinopril and hydrochlorothiazide.  Blood pressure is borderline today.  I would recommend wean him off of blood pressure medication if he can lose additional 25 pound.  3. Hyperlipidemia: Continue lovastatin.  Last lipid panel actually shows low LDL.  I decreased lovastatin by half to 20 mg daily.  If he can lose additional weight, I suspect we can wean him off of cholesterol medication as well depending on the lab work.  4. Morbid obesity.  He had great success after bariatric surgery.  He lost about 140 pounds.  His blood pressure is better and his cholesterol is much  improved.  Although I initially felt he can follow-up in 1 year, however he preferred to come back along with his mother for follow-up on the same day.  In the future, I suspect he may be able to follow-up with cardiology service more as needed basis.  I am quite happy with his progress.   Medication Adjustments/Labs and Tests Ordered: Current medicines are reviewed at length with the patient today.  Concerns regarding medicines are outlined above.  Medication changes, Labs and Tests ordered today are listed in the Patient Instructions below. Patient Instructions  Medication Instructions:   DECREASE Lovastatin to 20 mg daily  If you need a refill on your cardiac medications before your next appointment, please call your pharmacy.   Lab work: NONE ordered at this time of appointment   If you have labs (blood work) drawn today and your tests are completely normal, you will receive your results only by: Marland Kitchen MyChart Message (if you have MyChart) OR . A paper copy in the mail If you have any lab test that is abnormal or we need to change your treatment, we will call you to review the results.  Testing/Procedures: NONE ordered at this time of appointment   Follow-Up: At [facility], you and your health needs are our priority.  As part of our continuing mission to provide you with exceptional heart care, we have created designated Provider Care Teams.  These Care Teams include your primary Cardiologist (physician) and Advanced Practice Providers (APPs -  Physician Assistants and Nurse Practitioners) who all work together to provide you with the care you need, when you need it. You will need a follow up appointment in 6 months (February 2021).  Please call our office in December 2020 to schedule this appointment Sanda Klein, MD or one of the following Advanced Practice Providers on your designated Care Team: [place], [place] . Fabian Sharp, PA-C  Any Other Special Instructions Will Be Listed  Below (If Applicable).  Hilbert Corrigan, [place]  03/16/2019 11:27 AM    [facility] [address], [place], [name]  [address] Phone: [phone]; Fax: [phone]

## 2019-03-14 NOTE — Patient Instructions (Signed)
Medication Instructions:   DECREASE Lovastatin to 20 mg daily  If you need a refill on your cardiac medications before your next appointment, please call your pharmacy.   Lab work: NONE ordered at this time of appointment   If you have labs (blood work) drawn today and your tests are completely normal, you will receive your results only by: Marland Kitchen MyChart Message (if you have MyChart) OR . A paper copy in the mail If you have any lab test that is abnormal or we need to change your treatment, we will call you to review the results.  Testing/Procedures: NONE ordered at this time of appointment   Follow-Up: At [facility], you and your health needs are our priority.  As part of our continuing mission to provide you with exceptional heart care, we have created designated Provider Care Teams.  These Care Teams include your primary Cardiologist (physician) and Advanced Practice Providers (APPs -  Physician Assistants and Nurse Practitioners) who all work together to provide you with the care you need, when you need it. You will need a follow up appointment in 6 months (February 2021).  Please call our office in December 2020 to schedule this appointment Sanda Klein, MD or one of the following Advanced Practice Providers on your designated Care Team: [place], [place] . Fabian Sharp, PA-C  Any Other Special Instructions Will Be Listed Below (If Applicable).

## 2019-03-16 ENCOUNTER — Encounter: Payer: Self-pay | Admitting: Physician Assistant

## 2019-05-06 ENCOUNTER — Encounter (HOSPITAL_BASED_OUTPATIENT_CLINIC_OR_DEPARTMENT_OTHER): Payer: Medicare HMO | Admitting: Neurology

## 2019-06-13 ENCOUNTER — Ambulatory Visit (HOSPITAL_BASED_OUTPATIENT_CLINIC_OR_DEPARTMENT_OTHER): Payer: Medicare HMO | Admitting: Pharmacist

## 2019-06-13 ENCOUNTER — Encounter: Payer: Self-pay | Admitting: Internal Medicine

## 2019-06-13 ENCOUNTER — Other Ambulatory Visit: Payer: Self-pay

## 2019-06-13 ENCOUNTER — Ambulatory Visit: Payer: Medicare HMO | Attending: Internal Medicine | Admitting: Internal Medicine

## 2019-06-13 VITALS — BP 144/93 | HR 56 | Temp 98.1°F | Resp 16 | Wt 244.0 lb

## 2019-06-13 DIAGNOSIS — H9311 Tinnitus, right ear: Secondary | ICD-10-CM

## 2019-06-13 DIAGNOSIS — I10 Essential (primary) hypertension: Secondary | ICD-10-CM

## 2019-06-13 DIAGNOSIS — R269 Unspecified abnormalities of gait and mobility: Secondary | ICD-10-CM | POA: Diagnosis not present

## 2019-06-13 DIAGNOSIS — E538 Deficiency of other specified B group vitamins: Secondary | ICD-10-CM

## 2019-06-13 DIAGNOSIS — H6121 Impacted cerumen, right ear: Secondary | ICD-10-CM

## 2019-06-13 DIAGNOSIS — Z23 Encounter for immunization: Secondary | ICD-10-CM | POA: Diagnosis not present

## 2019-06-13 DIAGNOSIS — M21161 Varus deformity, not elsewhere classified, right knee: Secondary | ICD-10-CM

## 2019-06-13 DIAGNOSIS — M170 Bilateral primary osteoarthritis of knee: Secondary | ICD-10-CM

## 2019-06-13 DIAGNOSIS — M21162 Varus deformity, not elsewhere classified, left knee: Secondary | ICD-10-CM | POA: Diagnosis not present

## 2019-06-13 DIAGNOSIS — Z2821 Immunization not carried out because of patient refusal: Secondary | ICD-10-CM

## 2019-06-13 NOTE — Progress Notes (Signed)
Patient presents for vaccination against strep pneumo and tetanus per orders of Dr. Wynetta Emery. Consent given. Counseling provided. No contraindications exists. Vaccine administered without incident.

## 2019-06-13 NOTE — Patient Instructions (Addendum)
Your blood pressure is not at goal.  The goal is 130/80 or lower.  Try to check your blood pressure at least once a week.  Please sign a release for [place] to get your colonoscopy report from the provider who did the procedure.  Fall Prevention in the Home, Adult Falls can cause injuries. They can happen to people of all ages. There are many things you can do to make your home safe and to help prevent falls. Ask for help when making these changes, if needed. What actions can I take to prevent falls? General Instructions  Use good lighting in all rooms. Replace any light bulbs that burn out.  Turn on the lights when you go into a dark area. Use night-lights.  Keep items that you use often in easy-to-reach places. Lower the shelves around your home if necessary.  Set up your furniture so you have a clear path. Avoid moving your furniture around.  Do not have throw rugs and other things on the floor that can make you trip.  Avoid walking on wet floors.  If any of your floors are uneven, fix them.  Add color or contrast paint or tape to clearly mark and help you see: ? Any grab bars or handrails. ? First and last steps of stairways. ? Where the edge of each step is.  If you use a stepladder: ? Make sure that it is fully opened. Do not climb a closed stepladder. ? Make sure that both sides of the stepladder are locked into place. ? Ask someone to hold the stepladder for you while you use it.  If there are any pets around you, be aware of where they are. What can I do in the bathroom?      Keep the floor dry. Clean up any water that spills onto the floor as soon as it happens.  Remove soap buildup in the tub or shower regularly.  Use non-skid mats or decals on the floor of the tub or shower.  Attach bath mats securely with double-sided, non-slip rug tape.  If you need to sit down in the shower, use a plastic, non-slip stool.  Install grab bars by the toilet and in the tub and  shower. Do not use towel bars as grab bars. What can I do in the bedroom?  Make sure that you have a light by your bed that is easy to reach.  Do not use any sheets or blankets that are too big for your bed. They should not hang down onto the floor.  Have a firm chair that has side arms. You can use this for support while you get dressed. What can I do in the kitchen?  Clean up any spills right away.  If you need to reach something above you, use a strong step stool that has a grab bar.  Keep electrical cords out of the way.  Do not use floor polish or wax that makes floors slippery. If you must use wax, use non-skid floor wax. What can I do with my stairs?  Do not leave any items on the stairs.  Make sure that you have a light switch at the top of the stairs and the bottom of the stairs. If you do not have them, ask someone to add them for you.  Make sure that there are handrails on both sides of the stairs, and use them. Fix handrails that are broken or loose. Make sure that handrails are as long as  the stairways.  Install non-slip stair treads on all stairs in your home.  Avoid having throw rugs at the top or bottom of the stairs. If you do have throw rugs, attach them to the floor with carpet tape.  Choose a carpet that does not hide the edge of the steps on the stairway.  Check any carpeting to make sure that it is firmly attached to the stairs. Fix any carpet that is loose or worn. What can I do on the outside of my home?  Use bright outdoor lighting.  Regularly fix the edges of walkways and driveways and fix any cracks.  Remove anything that might make you trip as you walk through a door, such as a raised step or threshold.  Trim any bushes or trees on the path to your home.  Regularly check to see if handrails are loose or broken. Make sure that both sides of any steps have handrails.  Install guardrails along the edges of any raised decks and porches.  Clear  walking paths of anything that might make someone trip, such as tools or rocks.  Have any leaves, snow, or ice cleared regularly.  Use sand or salt on walking paths during winter.  Clean up any spills in your garage right away. This includes grease or oil spills. What other actions can I take?  Wear shoes that: ? Have a low heel. Do not wear high heels. ? Have rubber bottoms. ? Are comfortable and fit you well. ? Are closed at the toe. Do not wear open-toe sandals.  Use tools that help you move around (mobility aids) if they are needed. These include: ? Canes. ? Walkers. ? Scooters. ? Crutches.  Review your medicines with your doctor. Some medicines can make you feel dizzy. This can increase your chance of falling. Ask your doctor what other things you can do to help prevent falls. Where to find more information  Centers for Disease Control and Prevention, STEADI: https://garcia.biz/  [employer] on Aging: BrainJudge.co.uk Contact a doctor if:  You are afraid of falling at home.  You feel weak, drowsy, or dizzy at home.  You fall at home. Summary  There are many simple things that you can do to make your home safe and to help prevent falls.  Ways to make your home safe include removing tripping hazards and installing grab bars in the bathroom.  Ask for help when making these changes in your home. This information is not intended to replace advice given to you by your health care provider. Make sure you discuss any questions you have with your health care provider. Document Released: 05/10/2009 Document Revised: 11/04/2018 Document Reviewed: 02/26/2017 Elsevier Patient Education  [place] (Tetanus and Diphtheria): What You Need to Know 1. Why get vaccinated? Tetanus  and diphtheria are very serious diseases. They are rare in the [place] today, but people who do become infected often have severe complications. Td vaccine is used  to protect adolescents and adults from both of these diseases. Both tetanus and diphtheria are infections caused by bacteria. Diphtheria spreads from person to person through coughing or sneezing. Tetanus-causing bacteria enter the body through cuts, scratches, or wounds. TETANUS (Lockjaw) causes painful muscle tightening and stiffness, usually all over the body.  It can lead to tightening of muscles in the head and neck so you can't open your mouth, swallow, or sometimes even breathe. Tetanus kills about 1 out of every 10 people who are infected  even after receiving the best medical care. DIPHTHERIA can cause a thick coating to form in the back of the throat.  It can lead to breathing problems, paralysis, heart failure, and death. Before vaccines, as many as 200,000 cases of diphtheria and hundreds of cases of tetanus were reported in the [place] each year. Since vaccination began, reports of cases for both diseases have dropped by about 99%. 2. Td vaccine Td vaccine can protect adolescents and adults from tetanus and diphtheria. Td is usually given as a booster dose every 10 years but it can also be given earlier after a severe and dirty wound or burn. Another vaccine, called Tdap, which protects against pertussis in addition to tetanus and diphtheria, is sometimes recommended instead of Td vaccine. Your doctor or the person giving you the vaccine can give you more information. Td may safely be given at the same time as other vaccines. 3. Some people should not get this vaccine  A person who has ever had a life-threatening allergic reaction after a previous dose of any tetanus or diphtheria containing vaccine, OR has a severe allergy to any part of this vaccine, should not get Td vaccine. Tell the person giving the vaccine about any severe allergies.  Talk to your doctor if you: ? had severe pain or swelling after any vaccine containing diphtheria or tetanus, ? ever had a condition called  Guillain Barr Syndrome (GBS), ? aren't feeling well on the day the shot is scheduled. 4. Risks of a vaccine reaction With any medicine, including vaccines, there is a chance of side effects. These are usually mild and go away on their own. Serious reactions are also possible but are rare. Most people who get Td vaccine do not have any problems with it. Mild Problems following Td vaccine: (Did not interfere with activities)  Pain where the shot was given (about 8 people in 10)  Redness or swelling where the shot was given (about 1 person in 4)  Mild fever (rare)  Headache (about 1 person in 4)  Tiredness (about 1 person in 4) Moderate Problems following Td vaccine: (Interfered with activities, but did not require medical attention)  Fever over 102F (rare) Severe Problems following Td vaccine: (Unable to perform usual activities; required medical attention)  Swelling, severe pain, bleeding and/or redness in the arm where the shot was given (rare). Problems that could happen after any vaccine:  People sometimes faint after a medical procedure, including vaccination. Sitting or lying down for about 15 minutes can help prevent fainting, and injuries caused by a fall. Tell your doctor if you feel dizzy, or have vision changes or ringing in the ears.  Some people get severe pain in the shoulder and have difficulty moving the arm where a shot was given. This happens very rarely.  Any medication can cause a severe allergic reaction. Such reactions from a vaccine are very rare, estimated at fewer than 1 in a million doses, and would happen within a few minutes to a few hours after the vaccination. As with any medicine, there is a very remote chance of a vaccine causing a serious injury or death. The safety of vaccines is always being monitored. For more information, visit: http://www.aguilar.org/ 5. What if there is a serious reaction? What should I look for?  Look for anything that  concerns you, such as signs of a severe allergic reaction, very high fever, or unusual behavior. Signs of a severe allergic reaction can include hives, swelling of the  face and throat, difficulty breathing, a fast heartbeat, dizziness, and weakness. These would usually start a few minutes to a few hours after the vaccination. What should I do?  If you think it is a severe allergic reaction or other emergency that can't wait, call 9-1-1 or get the person to the nearest hospital. Otherwise, call your doctor.  Afterward, the reaction should be reported to the Vaccine Adverse Event Reporting System (VAERS). Your doctor might file this report, or you can do it yourself through the VAERS web site at www.vaers.SamedayNews.es, or by calling (959)472-8201. VAERS does not give medical advice. 6. The National Vaccine Injury Compensation Program The [employer] Vaccine Injury Compensation Program (VICP) is a federal program that was created to compensate people who may have been injured by certain vaccines. Persons who believe they may have been injured by a vaccine can learn about the program and about filing a claim by calling 818-313-5158 or visiting the [place] website at GoldCloset.com.ee. There is a time limit to file a claim for compensation. 7. How can I learn more?  Ask your doctor. He or she can give you the vaccine package insert or suggest other sources of information.  Call your local or state health department.  Contact the Centers for Disease Control and Prevention (CDC): ? Call 713-495-2494 (1-800-CDC-INFO) ? Visit CDC's website at http://hunter.com/ Vaccine Information Statement Td Vaccine (11/06/15) This information is not intended to replace advice given to you by your health care provider. Make sure you discuss any questions you have with your health care provider. Document Released: 05/11/2006 Document Revised: 03/01/2018 Document Reviewed: 03/01/2018 Elsevier Interactive  Patient Education  [place].  Pneumococcal Conjugate Vaccine (PCV13): What You Need to Know 1. Why get vaccinated? Pneumococcal conjugate vaccine (PCV13) can prevent pneumococcal disease. Pneumococcal disease refers to any illness caused by pneumococcal bacteria. These bacteria can cause many types of illnesses, including pneumonia, which is an infection of the lungs. Pneumococcal bacteria are one of the most common causes of pneumonia. Besides pneumonia, pneumococcal bacteria can also cause:  Ear infections  Sinus infections  Meningitis (infection of the tissue covering the brain and spinal cord)  Bacteremia (bloodstream infection) Anyone can get pneumococcal disease, but children under 63 years of age, people with certain medical conditions, adults 34 years or older, and cigarette smokers are at the highest risk. Most pneumococcal infections are mild. However, some can result in long-term problems, such as brain damage or hearing loss. Meningitis, bacteremia, and pneumonia caused by pneumococcal disease can be fatal. 2. PCV13 PCV13 protects against 13 types of bacteria that cause pneumococcal disease. Infants and young children usually need 4 doses of pneumococcal conjugate vaccine, at 2, 4, 6, and 63-54 months of age. In some cases, a child might need fewer than 4 doses to complete PCV13 vaccination. A dose of PCV23 vaccine is also recommended for anyone 2 years or older with certain medical conditions if they did not already receive PCV13. This vaccine may be given to adults 40 years or older based on discussions between the patient and health care provider. 3. Talk with your health care provider Tell your vaccine provider if the person getting the vaccine:  Has had an allergic reaction after a previous dose of PCV13, to an earlier pneumococcal conjugate vaccine known as PCV7, or to any vaccine containing diphtheria toxoid (for example, DTaP), or has any severe, life-threatening  allergies.  In some cases, your health care provider may decide to postpone PCV13 vaccination to a future  visit. People with minor illnesses, such as a cold, may be vaccinated. People who are moderately or severely ill should usually wait until they recover before getting PCV13. Your health care provider can give you more information. 4. Risks of a vaccine reaction  Redness, swelling, pain, or tenderness where the shot is given, and fever, loss of appetite, fussiness (irritability), feeling tired, headache, and chills can happen after PCV13. Young children may be at increased risk for seizures caused by fever after PCV13 if it is administered at the same time as inactivated influenza vaccine. Ask your health care provider for more information. People sometimes faint after medical procedures, including vaccination. Tell your provider if you feel dizzy or have vision changes or ringing in the ears. As with any medicine, there is a very remote chance of a vaccine causing a severe allergic reaction, other serious injury, or death. 5. What if there is a serious problem? An allergic reaction could occur after the vaccinated person leaves the clinic. If you see signs of a severe allergic reaction (hives, swelling of the face and throat, difficulty breathing, a fast heartbeat, dizziness, or weakness), call 9-1-1 and get the person to the nearest hospital. For other signs that concern you, call your health care provider. Adverse reactions should be reported to the Vaccine Adverse Event Reporting System (VAERS). Your health care provider will usually file this report, or you can do it yourself. Visit the VAERS website at www.vaers.SamedayNews.es or call (551)530-0667. VAERS is only for reporting reactions, and VAERS staff do not give medical advice. 6. The National Vaccine Injury Compensation Program The [employer] Vaccine Injury Compensation Program (VICP) is a federal program that was created to compensate people  who may have been injured by certain vaccines. Visit the VICP website at GoldCloset.com.ee or call (302)512-5590 to learn about the program and about filing a claim. There is a time limit to file a claim for compensation. 7. How can I learn more?  Ask your health care provider.  Call your local or state health department.  Contact the Centers for Disease Control and Prevention (CDC): ? Call 908-784-5923 (1-800-CDC-INFO) or ? Visit CDC's website at http://hunter.com/ Vaccine Information Statement PCV13 Vaccine (05/26/2018) This information is not intended to replace advice given to you by your health care provider. Make sure you discuss any questions you have with your health care provider. Document Released: 05/11/2006 Document Revised: 11/02/2018 Document Reviewed: 02/23/2018 Elsevier Patient Education  2020 [employer].

## 2019-06-13 NOTE — Progress Notes (Signed)
Patient ID: Brandon Marks, male    DOB: 1955-12-22  MRN: [id number]  CC: Otitis Media (possible)   Subjective: Brandon Marks is a 63 y.o. male who presents for chronic ds management His concerns today include:  Pt with hx of HTN, dCHF, OSA on CPAP, HL, morbid obesity, gastric bypass, normocytic anemia,  wgh loss surgery (Roux-en-Y)07/2017 at [facility]).  Last eval 02/28/2019  Pt c/o tinnitis  in RT ear with decreased heaing x 3-4 mths.  He has had a little rhinorrhea.  "I've been staggering x 6 mths.  I walk like I'm drunk but I do not drink."  No associated dizziness.  No incontinence.  Compliant with taking B12 and Vit D.  Does not recall the dose,  No numbness in hands, legs.  He has osteoarthritis in both knees.  He was seeing a specialist at [place].  However with weight loss, his knees do not bother him as much. Tripped and fell over a throw carpet a few wks ago.  Fell on [employer] and hit head Feel his strength is dec. Tried to crank up a generator yesterday and felt he was too weak in the arms  HTN: taking 1/2 dose of Lis/HCTZ as recommended by cardiology PA on recent visit.  With weight loss, cardiology thinks he can decrease his dose.  Patient however has not taken medicine in the last 2 to 3 days.   Has a home blood pressure monitoring device  but not sure how to use it.   HL: Cardiologist also recommend that he cut the dose of lovastatin in half.  He was on 40 mg.  He is now taking 20 mg.  He is tolerating the medicine okay.    HM:  declines influenza shot, c-scope 2 yrs ago - polys removed, told repeat in 5 yrs. I do not have a copy of his colonoscopy report.  He will sign his release for [place] to get this report from the provider who did it  Patient Active Problem List   Diagnosis Date Noted  . Obesity (BMI 30-39.9) 12/27/2018  . 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 Tinnitus of right ear 12/27/2018  . Gait disturbance 12/27/2018  .  Normochromic anemia 12/27/2018  . Primary osteoarthritis of both knees 12/27/2018  . Diastolic heart failure ([place]) 09/11/2017  . DDD (degenerative disc disease), cervical 09/04/2017  . Dyspnea, chronic DOE 02/27/2012  . Herniated disc   . Hyperlipidemia 02/23/2009  . 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 Essential hypertension 10/18/2007  . OSA on CPAP 10/11/2007     Current Outpatient Medications on File Prior to Visit  Medication Sig Dispense Refill  . levocetirizine (XYZAL) 5 MG tablet Take 5 mg by mouth every evening.    Marland Kitchen lisinopril-hydrochlorothiazide (ZESTORETIC) 20-25 MG tablet Take 1 tablet by mouth daily.    Marland Kitchen lovastatin (MEVACOR) 40 MG tablet Take 0.5 tablets (20 mg total) by mouth at bedtime. 45 tablet 3   No current facility-administered medications on file prior to visit.     Allergies  Allergen Reactions  . Codeine Nausea And Vomiting  . Oxycodone Other (See Comments)    Upset GI    Social History   Socioeconomic History  . Marital status: Divorced    Spouse name: Not on file  . Number of children: 0  . Years of education: some college  . Highest education level: Not on file  Occupational History  . Occupation: Retired  Scientific laboratory technician  .  Financial resource strain: Not on file  . Food insecurity    Worry: Not on file    Inability: Not on file  . Transportation needs    Medical: Not on file    Non-medical: Not on file  Tobacco Use  . Smoking status: Never Smoker  . Smokeless tobacco: Never Used  Substance and Sexual Activity  . Alcohol use: No  . Drug use: No  . Sexual activity: Not on file  Lifestyle  . Physical activity    Days per week: Not on file    Minutes per session: Not on file  . Stress: Not on file  Relationships  . Social Herbalist on phone: Not on file    Gets together: Not on file    Attends religious service: Not on file    Active member of club or organization: Not on file    Attends meetings of clubs  or organizations: Not on file    Relationship status: Not on file  . Intimate partner violence    Fear of current or ex partner: Not on file    Emotionally abused: Not on file    Physically abused: Not on file    Forced sexual activity: Not on file  Other Topics Concern  . Not on file  Social History Narrative   Denies caffeine use     Family History  Problem Relation Age of Onset  . Hypertension Mother   . Heart failure Mother   . Alzheimer's disease Mother   . Skin cancer Mother   . Heart disease Father   . Alzheimer's disease Maternal Grandmother   . Alzheimer's disease Maternal Grandfather     Past Surgical History:  Procedure Laterality Date  . CARDIAC CATHETERIZATION  02/20/2009   normal coronary arteries  . INCISION AND DRAINAGE PERIRECTAL ABSCESS N/A 01/02/2018   Procedure: IRRIGATION AND DEBRIDEMENT PERIRECTAL ABSCESS;  Surgeon: Coralie Keens, MD;  Location: [facility];  Service: General;  Laterality: N/A;  . IRRIGATION AND DEBRIDEMENT BUTTOCKS N/A 01/05/2018   Procedure: DEBRIDEMENT BUTTOCKS ABSCESS;  Surgeon: Georganna Skeans, MD;  Location: [facility];  Service: General;  Laterality: N/A;  . KNEE ARTHROSCOPY  2010   Lt  . NM MYOCAR PERF WALL MOTION  09/28/2007   small area of reversibility in the anterolateral wall at the apex concerning for ischemia  . SHOULDER ARTHROSCOPY  4/12   Rt  . WOUND DEBRIDEMENT N/A 01/04/2018   Procedure: IRRIGATION AND DEBRIDEMENT OF BUTTOCKS AND REMOVAL OF TICK FROM RIGHT TESTICLE;  Surgeon: Georganna Skeans, MD;  Location: [facility];  Service: General;  Laterality: N/A;    ROS: Review of Systems Negative except as stated above  PHYSICAL EXAM: BP (!) 144/93   Pulse (!) 56   Temp 98.1 F (36.7 C) (Oral)   Resp 16   Wt 244 lb (110.7 kg)   SpO2 100%   BMI 31.33 kg/m   Wt Readings from Last 3 Encounters:  06/13/19 244 lb (110.7 kg)  03/14/19 244 lb 6.4 oz (110.9 kg)  02/21/19 239 lb (108.4 kg)   Physical Exam General appearance - alert,  well appearing, and in no distress Mental status - normal mood, behavior, speech, dress, motor activity, and thought processes Eyes - pupils equal and reactive, extraocular eye movements intact Ears -moderate amount of hard wax buildup in the right ear.  Ear canal and membrane on the left are within normal limits Nose - normal and patent, no erythema, discharge or polyps  Mouth - mucous membranes moist, pharynx normal without lesions Neck - supple, no significant adenopathy Chest - clear to auscultation, no wheezes, rales or rhonchi, symmetric air entry Heart - normal rate, regular rhythm, normal S1, S2, no murmurs, rubs, clicks or gallops Neurological -grip is 5 out of 5 bilaterally.  Power 5/5 bilaterally in the upper and lower extremities.  Gross sensation intact bilaterally.  Romberg is negative Musculoskeletal -knee joints are enlarged.  He has varus deformity of both knees.  He walks with a waddling gait favoring his right leg.  Trunk is slightly flexed forward when he walks. Extremities -trace bilateral lower extremity edema  CMP Latest Ref Rng & Units 12/27/2018 01/11/2018 01/10/2018  Glucose 65 - 99 mg/dL 78 84 94  BUN 8 - 27 mg/dL 23 15 16   Creatinine 0.76 - 1.27 mg/dL 0.97 0.89 1.00  Sodium 134 - 144 mmol/L 143 139 139  Potassium 3.5 - 5.2 mmol/L 4.0 3.8 3.7  Chloride 96 - 106 mmol/L 105 105 106  CO2 20 - 29 mmol/L 27 28 29   Calcium 8.6 - 10.2 mg/dL 9.4 8.1(L) 8.1(L)  Total Protein 6.0 - 8.5 g/dL 6.1 - -  Total Bilirubin 0.0 - 1.2 mg/dL 0.7 - -  Alkaline Phos 39 - 117 IU/L 57 - -  AST 0 - 40 IU/L 14 - -  ALT 0 - 44 IU/L 17 - -   Lipid Panel     Component Value Date/Time   CHOL 99 (L) 12/27/2018 1144   TRIG 43 12/27/2018 1144   HDL 49 12/27/2018 1144   CHOLHDL 2.0 12/27/2018 1144   LDLCALC 41 12/27/2018 1144    CBC    Component Value Date/Time   WBC 6.7 12/27/2018 1144   WBC 19.5 (H) 01/11/2018 0500   RBC 4.22 12/27/2018 1144   RBC 3.52 (L) 01/11/2018 0500   HGB 13.0  12/27/2018 1144   HCT 38.0 12/27/2018 1144   PLT 283 12/27/2018 1144   MCV 90 12/27/2018 1144   MCH 30.8 12/27/2018 1144   MCH 29.3 01/11/2018 0500   MCHC 34.2 12/27/2018 1144   MCHC 33.8 01/11/2018 0500   RDW 12.8 12/27/2018 1144   LYMPHSABS 0.7 01/01/2018 1835   MONOABS 0.6 01/01/2018 1835   EOSABS 0.0 01/01/2018 1835   BASOSABS 0.2 (H) 01/01/2018 1835    ASSESSMENT AND PLAN: 1. Essential hypertension Not at goal. Advised to take the lisinopril/HCTZ daily half a tablet of the 20/25 mg.  Continue low-salt diet  2. Gait disturbance Patient has significant deformity of both knees and I think this is contributing.  Will refer him for some physical therapy for safety training and getting back to orthopedics.  Doubt this is a neurologic issue but will get a CT scan of the head for completeness - Ambulatory referral to Physical Therapy - CT Head Wo Contrast; Future - Ambulatory referral to Orthopedic Surgery  3. Primary osteoarthritis of both knees - Ambulatory referral to Orthopedic Surgery  4. Genu varum of both lower extremities   5. Tinnitus of right ear - Ambulatory referral to ENT  6. Impacted cerumen of right ear We did not have the complete equipment today to irrigate the ear however I am referring him to ENT for the tinnitus in the ear and cerumen can be removed at that time  7. Vitamin B12 deficiency - Vitamin B12  8. Need for Tdap vaccination Given  9. Need for vaccination against Streptococcus pneumoniae using pneumococcal conjugate vaccine 13 Given  10. Influenza  vaccination declined Offered flu shot.  He declined.    Patient was given the opportunity to ask questions.  Patient verbalized understanding of the plan and was able to repeat key elements of the plan.   No orders of the defined types were placed in this encounter.    Requested Prescriptions    No prescriptions requested or ordered in this encounter    No follow-ups on file.  Karle Plumber, MD, FACP

## 2019-06-14 LAB — VITAMIN B12: Vitamin B-12: 424 pg/mL (ref 232–1245)

## 2019-06-15 ENCOUNTER — Telehealth: Payer: Self-pay

## 2019-06-15 NOTE — Telephone Encounter (Signed)
Contacted pt to go over lab results left a detailed vm informing pt of results and if he has any questions or concerns to give me a call

## 2019-06-20 ENCOUNTER — Ambulatory Visit (HOSPITAL_COMMUNITY): Admission: RE | Admit: 2019-06-20 | Payer: Medicare HMO | Source: Ambulatory Visit

## 2019-06-27 ENCOUNTER — Other Ambulatory Visit: Payer: Self-pay

## 2019-06-27 ENCOUNTER — Ambulatory Visit (HOSPITAL_BASED_OUTPATIENT_CLINIC_OR_DEPARTMENT_OTHER): Payer: Medicare HMO | Attending: Internal Medicine | Admitting: Internal Medicine

## 2019-06-27 VITALS — Ht 73.0 in | Wt 239.0 lb

## 2019-06-27 DIAGNOSIS — G4733 Obstructive sleep apnea (adult) (pediatric): Secondary | ICD-10-CM

## 2019-07-01 ENCOUNTER — Telehealth: Payer: Self-pay | Admitting: *Deleted

## 2019-07-01 NOTE — Telephone Encounter (Signed)
Staff called patient insurance [employer] and spoke with Sam. Staff needed to know if patient insurance would cover in office sleep study test because the at home sleep study test failed 3 times. Per Sam, patient's benefit for procedure code [phone], it does not matter if it is in office or at home sleep study test, patient is responsible for a 20% co-insurance patient but will not have any co-insurance if patient also have medicaid. Per Sam, she will be faxing office patient benefits information to office for procedure code [phone] and what patient responsibility will be for that procedure code.

## 2019-07-07 DIAGNOSIS — M170 Bilateral primary osteoarthritis of knee: Secondary | ICD-10-CM | POA: Diagnosis not present

## 2019-07-07 DIAGNOSIS — M25561 Pain in right knee: Secondary | ICD-10-CM | POA: Diagnosis not present

## 2019-07-07 DIAGNOSIS — M25562 Pain in left knee: Secondary | ICD-10-CM | POA: Diagnosis not present

## 2019-07-13 ENCOUNTER — Telehealth: Payer: Self-pay | Admitting: Internal Medicine

## 2019-07-13 DIAGNOSIS — G4733 Obstructive sleep apnea (adult) (pediatric): Secondary | ICD-10-CM

## 2019-07-13 NOTE — Telephone Encounter (Signed)
-----   Message from Jackelyn Knife, [place] sent at 07/11/2019  2:08 PM EST ----- Regarding: order The sleep center is needing an order for split night

## 2019-07-13 NOTE — Telephone Encounter (Signed)
No ma'am it will be an in office. They are just needing the order

## 2019-07-21 DIAGNOSIS — M170 Bilateral primary osteoarthritis of knee: Secondary | ICD-10-CM | POA: Diagnosis not present

## 2019-07-21 DIAGNOSIS — M25562 Pain in left knee: Secondary | ICD-10-CM | POA: Diagnosis not present

## 2019-07-21 DIAGNOSIS — M25561 Pain in right knee: Secondary | ICD-10-CM | POA: Diagnosis not present

## 2019-07-28 ENCOUNTER — Telehealth: Payer: Self-pay | Admitting: Physician Assistant

## 2019-07-28 ENCOUNTER — Telehealth: Payer: Self-pay

## 2019-07-28 NOTE — Telephone Encounter (Signed)
Did not need this encounter °

## 2019-07-28 NOTE — Telephone Encounter (Addendum)
   Primary Cardiologist: Sanda Klein, MD  Chart reviewed as part of pre-operative protocol coverage. Patient was contacted 07/28/2019 in reference to pre-operative risk assessment for pending surgery as outlined below.  Brandon Marks was last seen on 03/14/19 by Almyra Deforest, PA.  Since that day, Brandon Marks has done well.  He was contacted via telephone 07/28/19 and reports doing well. His RCRI (revised cardiac risk index) perioperative risk of major cardiac event is 0.9%. He has an exercise tolerance of >4 METS. He denies anginal symptoms.   Therefore, based on ACC/AHA guidelines, the patient would be at acceptable risk for the planned procedure without further cardiovascular testing.   I will route this recommendation to the requesting party via Epic fax function and remove from pre-op pool.  Please call with questions.  Loel Dubonnet, NP 07/28/2019, 11:16 AM

## 2019-07-28 NOTE — Telephone Encounter (Signed)
   [facility] Pre-operative Risk Assessment    Request for surgical clearance:  1. What type of surgery is being performed? Right total knee arthroplasty   2. When is this surgery scheduled? 08/11/19  3. What type of clearance is required (medical clearance vs. Pharmacy clearance to hold med vs. Both)? Medical  4. Are there any medications that need to be held prior to surgery and how long? None  5. Practice name and name of physician performing surgery?  [facility]   Dr.Matthew Olin  6. What is your office phone number [phone]   7.   What is your office fax number [phone]  8.   Anesthesia type Spinal   Kathyrn Lass 07/28/2019, 9:27 AM  _________________________________________________________________   (provider comments below)

## 2019-08-02 ENCOUNTER — Other Ambulatory Visit (HOSPITAL_COMMUNITY)
Admission: RE | Admit: 2019-08-02 | Discharge: 2019-08-02 | Disposition: A | Discharge status: Home / Self Care | Payer: Medicare HMO | Source: Ambulatory Visit | Attending: Internal Medicine | Admitting: Internal Medicine

## 2019-08-02 DIAGNOSIS — Z20822 Contact with and (suspected) exposure to covid-19: Secondary | ICD-10-CM | POA: Insufficient documentation

## 2019-08-02 DIAGNOSIS — Z01812 Encounter for preprocedural laboratory examination: Secondary | ICD-10-CM | POA: Diagnosis not present

## 2019-08-02 LAB — SARS CORONAVIRUS 2 (TAT 6-24 HRS): SARS Coronavirus 2: NEGATIVE

## 2019-08-04 ENCOUNTER — Ambulatory Visit (HOSPITAL_BASED_OUTPATIENT_CLINIC_OR_DEPARTMENT_OTHER): Payer: Medicare HMO | Attending: Internal Medicine | Admitting: Internal Medicine

## 2019-08-04 ENCOUNTER — Other Ambulatory Visit: Payer: Self-pay

## 2019-08-04 ENCOUNTER — Telehealth: Payer: Self-pay | Admitting: Adult Health

## 2019-08-04 DIAGNOSIS — G4733 Obstructive sleep apnea (adult) (pediatric): Secondary | ICD-10-CM | POA: Diagnosis not present

## 2019-08-04 DIAGNOSIS — Z9989 Dependence on other enabling machines and devices: Secondary | ICD-10-CM

## 2019-08-04 NOTE — Telephone Encounter (Signed)
Brandon Marks  is asking for  A call to discuss the request for CPAP supplies for pt as of 12-22.  Please call Janett Billow at [phone]

## 2019-08-04 NOTE — Telephone Encounter (Signed)
Have not seen the pt since 09/29/16. Its been a decent amount of time pt may need a referral. Called and let [employer] rep Janett Billow know.

## 2019-08-05 ENCOUNTER — Other Ambulatory Visit (HOSPITAL_COMMUNITY): Payer: Self-pay | Admitting: *Deleted

## 2019-08-05 ENCOUNTER — Other Ambulatory Visit: Payer: Self-pay

## 2019-08-05 NOTE — Patient Instructions (Addendum)
DUE TO COVID-19 ONLY ONE VISITOR IS ALLOWED TO COME WITH YOU AND STAY IN THE WAITING ROOM ONLY DURING PRE OP AND PROCEDURE DAY OF SURGERY. THE 1 VISITOR MAY VISIT WITH YOU AFTER SURGERY IN YOUR PRIVATE ROOM DURING VISITING HOURS ONLY!  YOU NEED TO HAVE A COVID 19 TEST ON_  Monday 01/11/2021______ @_230  pm______, THIS TEST MUST BE DONE BEFORE SURGERY, COME  [place]  , [address].  ([place]) ONCE YOUR COVID TEST IS COMPLETED, PLEASE BEGIN THE QUARANTINE INSTRUCTIONS AS OUTLINED IN YOUR HANDOUT.                Brandon Marks     Your procedure is scheduled on: Thursday 08/11/2019   Report to [facility] Main  Entrance    Report to Short Stay at [place]  AM     Call this number if you have problems the morning of surgery [phone]    Remember: Do not eat food  :After Midnight.     NO SOLID FOOD AFTER MIDNIGHT THE NIGHT PRIOR TO SURGERY. NOTHING BY MOUTH EXCEPT CLEAR LIQUIDS UNTIL 0415 am .     PLEASE FINISH ENSURE DRINK PER SURGEON ORDER  WHICH NEEDS TO BE COMPLETED AT   0415 am.   CLEAR LIQUID DIET   Foods Allowed                                                                     Foods Excluded  Coffee and tea, regular and decaf                             liquids that you cannot  Plain Jell-O any favor except red or purple                                           see through such as: Fruit ices (not with fruit pulp)                                     milk, soups, orange juice  Iced Popsicles                                    All solid food Carbonated beverages, regular and diet                                    Cranberry, grape and apple juices Sports drinks like Gatorade Lightly seasoned clear broth or consume(fat free) Sugar, honey syrup  Sample Menu Breakfast                                Lunch  Supper Cranberry juice                    Beef broth                            Chicken  broth Jell-O                                     Grape juice                           Apple juice Coffee or tea                        Jell-O                                      Popsicle                                                Coffee or tea                        Coffee or tea  _____________________________________________________________________     BRUSH YOUR TEETH MORNING OF SURGERY AND RINSE YOUR MOUTH OUT, NO CHEWING GUM CANDY OR MINTS.     Take these medicines the morning of surgery with A SIP OF WATER: none                                 You may not have any metal on your body including hair pins and              piercings  Do not wear jewelry, make-up, lotions, powders or perfumes, deodorant                           Men may shave face and neck.   Do not bring valuables to the hospital. [place].  Contacts, dentures or bridgework may not be worn into surgery.  Leave suitcase in the car. After surgery it may be brought to your room.     Patients discharged the day of surgery will not be allowed to drive home. IF YOU ARE HAVING SURGERY AND GOING HOME THE SAME DAY, YOU MUST HAVE AN ADULT TO DRIVE YOU HOME AND BE WITH YOU FOR 24 HOURS. YOU MAY GO HOME BY TAXI OR UBER OR ORTHERWISE, BUT AN ADULT MUST ACCOMPANY YOU HOME AND STAY WITH YOU FOR 24 HOURS.  Name and phone number of your driver:girlfriend- Brandon Marks                Please read over the following fact sheets you were given: _____________________________________________________________________             [facility] - Preparing for Surgery Before surgery, you can play an important role.  Because skin is not sterile, your skin needs to  be as free of germs as possible.  You can reduce the number of germs on your skin by washing with CHG (chlorahexidine gluconate) soap before surgery.  CHG is an antiseptic cleaner which kills germs and bonds with the skin to  continue killing germs even after washing. Please DO NOT use if you have an allergy to CHG or antibacterial soaps.  If your skin becomes reddened/irritated stop using the CHG and inform your nurse when you arrive at Short Stay. Do not shave (including legs and underarms) for at least 48 hours prior to the first CHG shower.  You may shave your face/neck. Please follow these instructions carefully:  1.  Shower with CHG Soap the night before surgery and the  morning of Surgery.  2.  If you choose to wash your hair, wash your hair first as usual with your  normal  shampoo.  3.  After you shampoo, rinse your hair and body thoroughly to remove the  shampoo.                           4.  Use CHG as you would any other liquid soap.  You can apply chg directly  to the skin and wash                       Gently with a scrungie or clean washcloth.  5.  Apply the CHG Soap to your body ONLY FROM THE NECK DOWN.   Do not use on face/ open                           Wound or open sores. Avoid contact with eyes, ears mouth and genitals (private parts).                       Wash face,  Genitals (private parts) with your normal soap.             6.  Wash thoroughly, paying special attention to the area where your surgery  will be performed.  7.  Thoroughly rinse your body with warm water from the neck down.  8.  DO NOT shower/wash with your normal soap after using and rinsing off  the CHG Soap.                9.  Pat yourself dry with a clean towel.            10.  Wear clean pajamas.            11.  Place clean sheets on your bed the night of your first shower and do not  sleep with pets. Day of Surgery : Do not apply any lotions/deodorants the morning of surgery.  Please wear clean clothes to the hospital/surgery center.  FAILURE TO FOLLOW THESE INSTRUCTIONS MAY RESULT IN THE CANCELLATION OF YOUR SURGERY PATIENT SIGNATURE_________________________________  NURSE  SIGNATURE__________________________________  ________________________________________________________________________  WHAT IS A BLOOD TRANSFUSION? Blood Transfusion Information  A transfusion is the replacement of blood or some of its parts. Blood is made up of multiple cells which provide different functions.  Red blood cells carry oxygen and are used for blood loss replacement.  White blood cells fight against infection.  Platelets control bleeding.  Plasma helps clot blood.  Other blood products are available for specialized needs, such as hemophilia or other clotting disorders. BEFORE THE TRANSFUSION  Who gives blood for transfusions?   Healthy volunteers who are fully evaluated to make sure their blood is safe. This is blood bank blood. Transfusion therapy is the safest it has ever been in the practice of medicine. Before blood is taken from a donor, a complete history is taken to make sure that person has no history of diseases nor engages in risky social behavior (examples are intravenous drug use or sexual activity with multiple partners). The donor's travel history is screened to minimize risk of transmitting infections, such as malaria. The donated blood is tested for signs of infectious diseases, such as HIV and hepatitis. The blood is then tested to be sure it is compatible with you in order to minimize the chance of a transfusion reaction. If you or a relative donates blood, this is often done in anticipation of surgery and is not appropriate for emergency situations. It takes many days to process the donated blood. RISKS AND COMPLICATIONS Although transfusion therapy is very safe and saves many lives, the main dangers of transfusion include:   Getting an infectious disease.  Developing a transfusion reaction. This is an allergic reaction to something in the blood you were given. Every precaution is taken to prevent this. The decision to have a blood transfusion has been  considered carefully by your caregiver before blood is given. Blood is not given unless the benefits outweigh the risks. AFTER THE TRANSFUSION  Right after receiving a blood transfusion, you will usually feel much better and more energetic. This is especially true if your red blood cells have gotten low (anemic). The transfusion raises the level of the red blood cells which carry oxygen, and this usually causes an energy increase.  The nurse administering the transfusion will monitor you carefully for complications. HOME CARE INSTRUCTIONS  No special instructions are needed after a transfusion. You may find your energy is better. Speak with your caregiver about any limitations on activity for underlying diseases you may have. SEEK MEDICAL CARE IF:   Your condition is not improving after your transfusion.  You develop redness or irritation at the intravenous (IV) site. SEEK IMMEDIATE MEDICAL CARE IF:  Any of the following symptoms occur over the next 12 hours:  Shaking chills.  You have a temperature by mouth above 102 F (38.9 C), not controlled by medicine.  Chest, back, or muscle pain.  People around you feel you are not acting correctly or are confused.  Shortness of breath or difficulty breathing.  Dizziness and fainting.  You get a rash or develop hives.  You have a decrease in urine output.  Your urine turns a dark color or changes to pink, red, or brown. Any of the following symptoms occur over the next 10 days:  You have a temperature by mouth above 102 F (38.9 C), not controlled by medicine.  Shortness of breath.  Weakness after normal activity.  The white part of the eye turns yellow (jaundice).  You have a decrease in the amount of urine or are urinating less often.  Your urine turns a dark color or changes to pink, red, or brown. Document Released: 07/11/2000 Document Revised: 10/06/2011 Document Reviewed: 02/28/2008 [facility] Patient Information 2014  [place], [place].  _______________________________________________________________________

## 2019-08-06 DIAGNOSIS — Z9989 Dependence on other enabling machines and devices: Secondary | ICD-10-CM | POA: Diagnosis not present

## 2019-08-06 DIAGNOSIS — G4733 Obstructive sleep apnea (adult) (pediatric): Secondary | ICD-10-CM

## 2019-08-06 NOTE — Procedures (Signed)
Patient Name: Brandon Marks, Brandon Marks Date: 08/04/2019 Gender: Male D.O.B: 10-27-1955 Age (years): 77 Referring Provider: Karle Plumber MD Height (inches): 71 Interpreting Physician: Baird Lyons MD, ABSM Weight (lbs): 239 RPSGT: Baxter Flattery BMI: 33 MRN: [id number] Neck Size: 18.00  CLINICAL INFORMATION Sleep Study Type: NPSG Indication for sleep study: Fatigue, Snoring, Witnesses Apnea / Gasping During Sleep Epworth Sleepiness Score: 4  SLEEP STUDY TECHNIQUE As per the AASM Manual for the Scoring of Sleep and Associated Events v2.3 (April 2016) with a hypopnea requiring 4% desaturations.  The channels recorded and monitored were frontal, central and occipital EEG, electrooculogram (EOG), submentalis EMG (chin), nasal and oral airflow, thoracic and abdominal wall motion, anterior tibialis EMG, snore microphone, electrocardiogram, and pulse oximetry.  MEDICATIONS Medications self-administered by patient taken the night of the study : none reported  SLEEP ARCHITECTURE The study was initiated at 10:40:42 PM and ended at 4:55:22 AM.  Sleep onset time was 57.4 minutes and the sleep efficiency was 65.6%%. The total sleep time was 245.8 minutes.  Stage REM latency was 167.5 minutes.  The patient spent 12.6%% of the night in stage N1 sleep, 68.7%% in stage N2 sleep, 0.0%% in stage N3 and 18.7% in REM.  Alpha intrusion was absent.  Supine sleep was 50.09%.  RESPIRATORY PARAMETERS The overall apnea/hypopnea index (AHI) was 8.5 per hour. There were 15 total apneas, including 15 obstructive, 0 central and 0 mixed apneas. There were 20 hypopneas and 0 RERAs.  The AHI during Stage REM sleep was 13.0 per hour.  AHI while supine was 14.1 per hour.  The mean oxygen saturation was 94.9%. The minimum SpO2 during sleep was 83.0%.  moderate snoring was noted during this study.  CARDIAC DATA The 2 lead EKG demonstrated sinus rhythm. The mean heart rate was 63.8 beats per minute.  Other EKG findings include: PVCs.  LEG MOVEMENT DATA The total PLMS were 0 with a resulting PLMS index of 0.0. Associated arousal with leg movement index was 0.0 .  IMPRESSIONS - Mild obstructive sleep apnea occurred during this study (AHI = 8.5/h). - Patient had difficulty initiating and maintaining sleep. There were insufficient early sleep and events to meet protocol requirements for split CPAP titration. - No significant central sleep apnea occurred during this study (CAI = 0.0/h). - Mild oxygen desaturation was noted during this study (Min O2 = 83.0%). Mean sat 95%. - The patient snored with moderate snoring volume. - EKG findings include frequent PACs. - Clinically significant periodic limb movements did not occur during sleep. No significant associated arousals.  DIAGNOSIS - Obstructive Sleep Apnea (327.23 [G47.33 ICD-10])  RECOMMENDATIONS - Suggest CPAP titration sleep study or autopap. Other alternatives,including weight loss, a fitted oral appliance, or ENT evaluation,would be based on clinical judgment. - Positional therapy avoiding supine position during sleep. - Avoid alcohol, sedatives and other CNS depressants that may worsen sleep apnea and disrupt normal sleep architecture. - Sleep hygiene should be reviewed to assess factors that may improve sleep quality. - Weight management and regular exercise should be initiated or continued if appropriate.  [Electronically signed] 08/06/2019 04:20 PM  Baird Lyons MD, [place], American Board of Sleep Medicine   NPI: [id number]                         [place], [place] [facility]  ELECTRONICALLY SIGNED ON:  08/06/2019, 4:15 PM [place]: [phone]   [place]: [phone]  [place] OF [facility]EP [facility]ICINE

## 2019-08-07 ENCOUNTER — Other Ambulatory Visit: Payer: Self-pay | Admitting: Internal Medicine

## 2019-08-07 DIAGNOSIS — G4733 Obstructive sleep apnea (adult) (pediatric): Secondary | ICD-10-CM

## 2019-08-07 NOTE — Progress Notes (Signed)
Let patient know that his sleep study reveals that he still has mild sleep apnea.  Will need to get second part of sleep study done for titration of CPAP.  Order submitted.

## 2019-08-08 ENCOUNTER — Telehealth: Payer: Self-pay

## 2019-08-08 ENCOUNTER — Encounter (HOSPITAL_COMMUNITY)
Admission: RE | Admit: 2019-08-08 | Discharge: 2019-08-08 | Disposition: A | Discharge status: Home / Self Care | Payer: Medicare HMO | Source: Ambulatory Visit | Attending: Orthopedic Surgery | Admitting: Orthopedic Surgery

## 2019-08-08 ENCOUNTER — Encounter (HOSPITAL_COMMUNITY): Payer: Self-pay

## 2019-08-08 ENCOUNTER — Other Ambulatory Visit: Payer: Self-pay

## 2019-08-08 ENCOUNTER — Other Ambulatory Visit (HOSPITAL_COMMUNITY)
Admission: RE | Admit: 2019-08-08 | Discharge: 2019-08-08 | Disposition: A | Discharge status: Home / Self Care | Payer: Medicare HMO | Source: Ambulatory Visit | Attending: Orthopedic Surgery | Admitting: Orthopedic Surgery

## 2019-08-08 DIAGNOSIS — Z20822 Contact with and (suspected) exposure to covid-19: Secondary | ICD-10-CM | POA: Insufficient documentation

## 2019-08-08 DIAGNOSIS — M1712 Unilateral primary osteoarthritis, left knee: Secondary | ICD-10-CM | POA: Diagnosis not present

## 2019-08-08 DIAGNOSIS — Z01812 Encounter for preprocedural laboratory examination: Secondary | ICD-10-CM | POA: Diagnosis not present

## 2019-08-08 LAB — BASIC METABOLIC PANEL
Anion gap: 7 (ref 5–15)
BUN: 22 mg/dL (ref 8–23)
CO2: 27 mmol/L (ref 22–32)
Calcium: 8.8 mg/dL — ABNORMAL LOW (ref 8.9–10.3)
Chloride: 106 mmol/L (ref 98–111)
Creatinine, Ser: 1.09 mg/dL (ref 0.61–1.24)
GFR calc Af Amer: >60 mL/min (ref >60)
GFR calc non Af Amer: >60 mL/min (ref >60)
Glucose, Bld: 85 mg/dL (ref 70–99)
Potassium: 3.7 mmol/L (ref 3.5–5.1)
Sodium: 140 mmol/L (ref 135–145)

## 2019-08-08 LAB — SURGICAL PCR SCREEN
MRSA, PCR: NEGATIVE
Staphylococcus aureus: NEGATIVE

## 2019-08-08 LAB — CBC
HCT: 41.8 % (ref 39.0–52.0)
Hemoglobin: 13.6 g/dL (ref 13.0–17.0)
MCH: 30.8 pg (ref 26.0–34.0)
MCHC: 32.5 g/dL (ref 30.0–36.0)
MCV: 94.8 fL (ref 80.0–100.0)
Platelets: 213 10*3/uL (ref 150–400)
RBC: 4.41 MIL/uL (ref 4.22–5.81)
RDW: 13.3 % (ref 11.5–15.5)
WBC: 6.8 10*3/uL (ref 4.0–10.5)
nRBC: 0 % (ref 0.0–0.2)

## 2019-08-08 LAB — ABO/RH: ABO/RH(D): O POS

## 2019-08-08 NOTE — Progress Notes (Addendum)
PCP - Dr. Karle Plumber  LOV-11/016/2020 EPIC Cardiologist - Dr. Marcelino Scot   07/28/2019- cardiac clearance in Epic from Laurann Montana, NP  Chest x-ray - n/a EKG - 03/14/2019  EPIC Stress Test - 12/31/2016  EPIC ECHO -  Cardiac Cath - 02/20/2009  EPIC  Sleep Study - 08/04/2019  In Epic, new sleep study since lost weight CPAP - does not use CPAP  Fasting Blood Sugar - n/a Checks Blood Sugar __0___ times a day  Blood Thinner Instructions:n/a Aspirin Instructions:n/a Last Dose:n/a  Anesthesia review:   Patient has a history of HTN, OSA previous study but did not use CPAP and had new sleep study 08/04/2019.  Patient denies shortness of breath, fever, cough and chest pain at PAT appointment   Patient verbalized understanding of instructions that were given to them at the PAT appointment. Patient was also instructed that they will need to review over the PAT instructions again at home before surgery.

## 2019-08-08 NOTE — Telephone Encounter (Signed)
Contacted pt to go over sleep study results pt is aware and doesn't have any questions or concerns

## 2019-08-09 LAB — NOVEL CORONAVIRUS, NAA (HOSP ORDER, SEND-OUT TO REF LAB; TAT 18-24 HRS): SARS-CoV-2, NAA: NOT DETECTED

## 2019-08-10 ENCOUNTER — Telehealth: Payer: Self-pay | Admitting: Internal Medicine

## 2019-08-10 ENCOUNTER — Encounter (HOSPITAL_COMMUNITY): Payer: Self-pay | Admitting: Orthopedic Surgery

## 2019-08-10 NOTE — Telephone Encounter (Signed)
Terry from the sleep center called saying they received an order for a CPAP study. Patient has [employer] and they need a prior [place]. Please f/u

## 2019-08-10 NOTE — H&P (Signed)
TOTAL KNEE ADMISSION H&P  Patient is being admitted for left total knee arthroplasty.  Subjective:  Chief Complaint:left knee pain.  HPI: Brandon Marks, 64 y.o. male, has a history of pain and functional disability in the left knee due to arthritis and has failed non-surgical conservative treatments for greater than 12 weeks to includecorticosteriod injections, viscosupplementation injections and activity modification.  Onset of symptoms was gradual, starting 2 years ago with gradually worsening course since that time. The patient noted prior procedures on the knee to include  arthroscopy and menisectomy on the left knee(s).  Patient currently rates pain in the left knee(s) at 8 out of 10 with activity. Patient has worsening of pain with activity and weight bearing and pain that interferes with activities of daily living.  Patient has evidence of advanced bilateral knee osteoarthritis with complete loss of joint space and significant periarticular osteophytes within the medial patellofemoral compartments by imaging studies.There is no active infection.  Patient Active Problem List   Diagnosis Date Noted  . Genu varum of both lower extremities 06/13/2019  . Vitamin B12 deficiency 06/13/2019  . Obesity (BMI 30-39.9) 12/27/2018  . 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 Tinnitus of right ear 12/27/2018  . Gait disturbance 12/27/2018  . Normochromic anemia 12/27/2018  . Primary osteoarthritis of both knees 12/27/2018  . Diastolic heart failure ([place]) 09/11/2017  . DDD (degenerative disc disease), cervical 09/04/2017  . Dyspnea, chronic DOE 02/27/2012  . Herniated disc   . Hyperlipidemia 02/23/2009  . 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 Essential hypertension 10/18/2007  . OSA on CPAP 10/11/2007   Past Medical History:  Diagnosis Date  . Arthritis   . Herniated disc   . High cholesterol   . Hypertension   . Morbid obesity ([place])   . PAC (premature atrial  contraction)   . Sleep apnea    non compliant with c-pap    Past Surgical History:  Procedure Laterality Date  . CARDIAC CATHETERIZATION  02/20/2009   normal coronary arteries  . EYE SURGERY     bilateral cataract with lens implant  . INCISION AND DRAINAGE PERIRECTAL ABSCESS N/A 01/02/2018   Procedure: IRRIGATION AND DEBRIDEMENT PERIRECTAL ABSCESS;  Surgeon: Coralie Keens, MD;  Location: [facility];  Service: General;  Laterality: N/A;  . IRRIGATION AND DEBRIDEMENT BUTTOCKS N/A 01/05/2018   Procedure: DEBRIDEMENT BUTTOCKS ABSCESS;  Surgeon: Georganna Skeans, MD;  Location: [facility];  Service: General;  Laterality: N/A;  . KNEE ARTHROSCOPY  2010   Lt  . NM MYOCAR PERF WALL MOTION  09/28/2007   small area of reversibility in the anterolateral wall at the apex concerning for ischemia  . SHOULDER ARTHROSCOPY  4/12   Rt  . WOUND DEBRIDEMENT N/A 01/04/2018   Procedure: IRRIGATION AND DEBRIDEMENT OF BUTTOCKS AND REMOVAL OF TICK FROM RIGHT TESTICLE;  Surgeon: Georganna Skeans, MD;  Location: [facility];  Service: General;  Laterality: N/A;    No current facility-administered medications for this encounter.   Current Outpatient Medications  Medication Sig Dispense Refill Last Dose  . lisinopril-hydrochlorothiazide (ZESTORETIC) 20-25 MG tablet Take 1 tablet by mouth daily.     Marland Kitchen lovastatin (MEVACOR) 40 MG tablet Take 0.5 tablets (20 mg total) by mouth at bedtime. (Patient taking differently: Take 20 mg by mouth daily. ) 45 tablet 3    Allergies  Allergen Reactions  . Codeine Nausea And Vomiting  . Oxycodone Other (See Comments)    Upset GI    Social History  Tobacco Use  . Smoking status: Never Smoker  . Smokeless tobacco: Never Used  Substance Use Topics  . Alcohol use: No    Family History  Problem Relation Age of Onset  . Hypertension Mother   . Heart failure Mother   . Alzheimer's disease Mother   . Skin cancer Mother   . Heart disease Father   . Alzheimer's disease Maternal Grandmother     . Alzheimer's disease Maternal Grandfather      Review of Systems  Constitutional: Negative for chills and fever.  Respiratory: Negative for cough and shortness of breath.   Gastrointestinal: Negative for nausea and vomiting.  Musculoskeletal: Positive for arthralgias.    Objective:  Physical Exam Patient is a 64 year old male.  Well nourished and well developed. General: Alert and oriented x3, cooperative and pleasant, no acute distress. Head: normocephalic, atraumatic, neck supple. Eyes: EOMI Respiratory: breath sounds clear in all fields, no wheezing, rales, or rhonchi. Cardiovascular: Regular rate and rhythm, no murmurs, gallops or rubs. Abdomen: non-tender to palpation and soft, normoactive bowel sounds.  Musculoskeletal: Bilateral knee exam: Bilateral genu varum Slight flexion contracture bilaterally Flexes to 120 with tightness Global tenderness worse medial and anterior Otherwise neurovascular intact No evidence of lower extremity cellulitis  Calves soft and nontender. Motor function intact in LE. Strength 5/5 LE bilaterally. Neuro: Distal pulses 2+. Sensation to light touch intact in LE.  Vital signs in last 24 hours:   Labs:  Estimated body mass index is 30.43 kg/m as calculated from the following:   Height as of 08/08/19: 6\' 2"  (1.88 m).   Weight as of 08/08/19: 107.5 kg.   Imaging Review Plain radiographs demonstrate severe degenerative joint disease of the left knee(s). The overall alignment isneutral. The bone quality appears to be adequate for age and reported activity level.   Assessment/Plan:  End stage arthritis, left knee   The patient history, physical examination, clinical judgment of the provider and imaging studies are consistent with end stage degenerative joint disease of the left knee(s) and total knee arthroplasty is deemed medically necessary. The treatment options including medical management, injection therapy arthroscopy and  arthroplasty were discussed at length. The risks and benefits of total knee arthroplasty were presented and reviewed. The risks due to aseptic loosening, infection, stiffness, patella tracking problems, thromboembolic complications and other imponderables were discussed. The patient acknowledged the explanation, agreed to proceed with the plan and consent was signed. Patient is being admitted for inpatient treatment for surgery, pain control, PT, OT, prophylactic antibiotics, VTE prophylaxis, progressive ambulation and ADL's and discharge planning. The patient is planning to be discharged home.  Therapy Plans: outpatient therapy at [facility] Disposition: Home with a friend Planned DVT Prophylaxis: aspirin 81mg  BID DME needed: walker PCP: Dr. Wynetta Emery Cardiologist: Dr. Sallyanne Kuster, clearance received TXA: IV Allergies: NKDA Anesthesia Concerns: none BMI: 30.8 Not diabetic.  Other: May go home same day vs stay the night based on comfort level after surgery. No help at home.  [place] okay.   Patient's anticipated LOS is less than 2 midnights, meeting these requirements: - Younger than 36 - Lives within 1 hour of care - Has a competent adult at home to recover with post-op recover - NO history of  - Chronic pain requiring opiods  - Diabetes  - Coronary Artery Disease  - Heart failure  - Heart attack  - Stroke  - DVT/VTE  - Cardiac arrhythmia  - Respiratory Failure/COPD  - Renal failure  - Anemia  - Advanced  Liver disease  - Patient was instructed on what medications to stop prior to surgery. - Follow-up visit in 2 weeks with Dr. Alvan Dame - Begin physical therapy following surgery - Pre-operative lab work as pre-surgical testing - Prescriptions will be provided in hospital at time of discharge  Griffith Citron, PA-C Orthopedic Surgery [facility] [place] [phone]

## 2019-08-10 NOTE — Anesthesia Preprocedure Evaluation (Addendum)
Anesthesia Evaluation  Patient identified by MRN, date of birth, ID band Patient awake    Reviewed: Allergy & Precautions, NPO status , Patient's Chart, lab work & pertinent test results, reviewed documented beta blocker date and time   Airway Mallampati: II  TM Distance: >3 FB Neck ROM: Limited    Dental no notable dental hx. (+) Teeth Intact   Pulmonary shortness of breath, sleep apnea and Continuous Positive Airway Pressure Ventilation ,  Non compliant with CPAP   Pulmonary exam normal breath sounds clear to auscultation       Cardiovascular hypertension, Pt. on medications Normal cardiovascular exam Rhythm:Regular Rate:Normal     Neuro/Psych negative neurological ROS  negative psych ROS   GI/Hepatic Neg liver ROS, S/P Gastric bypass- lost 170 lbs   Endo/Other  Obesity Hypercholesterolemia  Renal/GU negative Renal ROS  negative genitourinary   Musculoskeletal  (+) Arthritis , Osteoarthritis,  Left Knee OA Bulging disc C-spine   Abdominal (+) + obese,   Peds  Hematology  (+) anemia ,   Anesthesia Other Findings   Reproductive/Obstetrics                         Anesthesia Physical Anesthesia Plan  ASA: III  Anesthesia Plan: General   Post-op Pain Management:  Regional for Post-op pain   Induction: Intravenous  PONV Risk Score and Plan: 4 or greater and Midazolam, Ondansetron, Treatment may vary due to age or medical condition, Scopolamine patch - Pre-op and Propofol infusion  Airway Management Planned: Natural Airway, Nasal Cannula and Simple Face Mask  Additional Equipment:   Intra-op Plan:   Post-operative Plan:   Informed Consent: I have reviewed the patients History and Physical, chart, labs and discussed the procedure including the risks, benefits and alternatives for the proposed anesthesia with the patient or authorized representative who has indicated his/her  understanding and acceptance.     Dental advisory given  Plan Discussed with:   Anesthesia Plan Comments: (Cleared by cardiology 07/28/19, "He was contacted via telephone 07/28/19 and reports doing well. His RCRI (revised cardiac risk index) perioperative risk of major cardiac event is 0.9%. He has an exercise tolerance of >4 METS. He denies anginal symptoms.   Therefore, based on ACC/AHA guidelines, the patient would be at acceptable risk for the planned procedure without further cardiovascular testing.")      Anesthesia Quick Evaluation

## 2019-08-11 ENCOUNTER — Encounter (HOSPITAL_COMMUNITY)
Admission: RE | Disposition: A | Discharge status: Home / Self Care | Payer: Self-pay | Source: Home / Self Care | Attending: Orthopedic Surgery

## 2019-08-11 ENCOUNTER — Encounter (HOSPITAL_COMMUNITY): Payer: Self-pay | Admitting: Orthopedic Surgery

## 2019-08-11 ENCOUNTER — Ambulatory Visit (HOSPITAL_COMMUNITY): Payer: Medicare HMO | Admitting: Physician Assistant

## 2019-08-11 ENCOUNTER — Observation Stay (HOSPITAL_COMMUNITY)
Admission: RE | Admit: 2019-08-11 | Discharge: 2019-08-13 | Disposition: A | Discharge status: Home / Self Care | Payer: Medicare HMO | Attending: Orthopedic Surgery | Admitting: Orthopedic Surgery

## 2019-08-11 ENCOUNTER — Other Ambulatory Visit: Payer: Self-pay

## 2019-08-11 DIAGNOSIS — E7800 Pure hypercholesterolemia, unspecified: Secondary | ICD-10-CM | POA: Diagnosis not present

## 2019-08-11 DIAGNOSIS — Z79899 Other long term (current) drug therapy: Secondary | ICD-10-CM | POA: Diagnosis not present

## 2019-08-11 DIAGNOSIS — Z6830 Body mass index (BMI) 30.0-30.9, adult: Secondary | ICD-10-CM | POA: Diagnosis not present

## 2019-08-11 DIAGNOSIS — M170 Bilateral primary osteoarthritis of knee: Principal | ICD-10-CM | POA: Insufficient documentation

## 2019-08-11 DIAGNOSIS — G4733 Obstructive sleep apnea (adult) (pediatric): Secondary | ICD-10-CM | POA: Diagnosis not present

## 2019-08-11 DIAGNOSIS — M659 Synovitis and tenosynovitis, unspecified: Secondary | ICD-10-CM | POA: Diagnosis not present

## 2019-08-11 DIAGNOSIS — Z96659 Presence of unspecified artificial knee joint: Secondary | ICD-10-CM

## 2019-08-11 DIAGNOSIS — E669 Obesity, unspecified: Secondary | ICD-10-CM | POA: Diagnosis present

## 2019-08-11 DIAGNOSIS — Z96652 Presence of left artificial knee joint: Secondary | ICD-10-CM

## 2019-08-11 DIAGNOSIS — M1712 Unilateral primary osteoarthritis, left knee: Secondary | ICD-10-CM | POA: Diagnosis not present

## 2019-08-11 DIAGNOSIS — E785 Hyperlipidemia, unspecified: Secondary | ICD-10-CM | POA: Insufficient documentation

## 2019-08-11 DIAGNOSIS — M25762 Osteophyte, left knee: Secondary | ICD-10-CM | POA: Diagnosis not present

## 2019-08-11 DIAGNOSIS — Z9884 Bariatric surgery status: Secondary | ICD-10-CM | POA: Insufficient documentation

## 2019-08-11 DIAGNOSIS — G8918 Other acute postprocedural pain: Secondary | ICD-10-CM | POA: Diagnosis not present

## 2019-08-11 DIAGNOSIS — I10 Essential (primary) hypertension: Secondary | ICD-10-CM | POA: Diagnosis not present

## 2019-08-11 HISTORY — PX: TOTAL KNEE ARTHROPLASTY: SHX125

## 2019-08-11 LAB — TYPE AND SCREEN
ABO/RH(D): O POS
Antibody Screen: NEGATIVE

## 2019-08-11 SURGERY — ARTHROPLASTY, KNEE, TOTAL
Anesthesia: General | Site: Knee | Laterality: Left

## 2019-08-11 MED ORDER — PRAVASTATIN SODIUM 20 MG PO TABS
20.0000 mg | ORAL_TABLET | Freq: Every day | ORAL | Status: DC
  Administered 2019-08-12: 20 mg via ORAL
  Filled 2019-08-11: qty 1

## 2019-08-11 MED ORDER — HYDROCHLOROTHIAZIDE 25 MG PO TABS
25.0000 mg | ORAL_TABLET | Freq: Every day | ORAL | Status: DC
  Administered 2019-08-12 – 2019-08-13 (×2): 25 mg via ORAL
  Filled 2019-08-11 (×2): qty 1

## 2019-08-11 MED ORDER — MIDAZOLAM HCL 2 MG/2ML IJ SOLN: 1.0000 mg | INTRAMUSCULAR | Status: DC

## 2019-08-11 MED ORDER — CEFAZOLIN SODIUM-DEXTROSE 2-4 GM/100ML-% IV SOLN: 2.0000 g | INTRAVENOUS | Status: DC

## 2019-08-11 MED ORDER — ASPIRIN 81 MG PO CHEW
81.0000 mg | CHEWABLE_TABLET | Freq: Two times a day (BID) | ORAL | Status: DC
  Administered 2019-08-11 – 2019-08-13 (×4): 81 mg via ORAL
  Filled 2019-08-11 (×4): qty 1

## 2019-08-11 MED ORDER — MAGNESIUM CITRATE PO SOLN
1.0000 | Freq: Once | ORAL | Status: DC | PRN
  Filled 2019-08-11: qty 296

## 2019-08-11 MED ORDER — METHOCARBAMOL 500 MG IVPB - SIMPLE MED
INTRAVENOUS | Status: AC
  Filled 2019-08-11: qty 50

## 2019-08-11 MED ORDER — SODIUM CHLORIDE 0.9 % IR SOLN
Status: DC | PRN
  Administered 2019-08-11: 1000 mL

## 2019-08-11 MED ORDER — CHLORHEXIDINE GLUCONATE 4 % EX LIQD: 60.0000 mL | Freq: Once | CUTANEOUS | Status: DC

## 2019-08-11 MED ORDER — LACTATED RINGERS IV SOLN: INTRAVENOUS | Status: DC

## 2019-08-11 MED ORDER — SODIUM CHLORIDE (PF) 0.9 % IJ SOLN
INTRAMUSCULAR | Status: DC | PRN
  Administered 2019-08-11: 30 mL

## 2019-08-11 MED ORDER — EPHEDRINE SULFATE-NACL 50-0.9 MG/10ML-% IV SOSY
PREFILLED_SYRINGE | INTRAVENOUS | Status: DC | PRN
  Administered 2019-08-11 (×3): 10 mg via INTRAVENOUS
  Administered 2019-08-11: 5 mg via INTRAVENOUS
  Administered 2019-08-11: 10 mg via INTRAVENOUS

## 2019-08-11 MED ORDER — ASPIRIN 81 MG PO CHEW: 81.0000 mg | CHEWABLE_TABLET | Freq: Two times a day (BID) | ORAL | 0 refills | Status: AC

## 2019-08-11 MED ORDER — CEFAZOLIN SODIUM-DEXTROSE 2-4 GM/100ML-% IV SOLN
INTRAVENOUS | Status: AC
  Filled 2019-08-11: qty 100

## 2019-08-11 MED ORDER — PHENOL 1.4 % MT LIQD
1.0000 | OROMUCOSAL | Status: DC | PRN
  Filled 2019-08-11: qty 177

## 2019-08-11 MED ORDER — CELECOXIB 200 MG PO CAPS
200.0000 mg | ORAL_CAPSULE | Freq: Two times a day (BID) | ORAL | Status: DC
  Administered 2019-08-11 – 2019-08-13 (×4): 200 mg via ORAL
  Filled 2019-08-11 (×4): qty 1

## 2019-08-11 MED ORDER — FERROUS SULFATE 325 (65 FE) MG PO TABS
325.0000 mg | ORAL_TABLET | Freq: Two times a day (BID) | ORAL | Status: DC
  Administered 2019-08-12 – 2019-08-13 (×3): 325 mg via ORAL
  Filled 2019-08-11 (×3): qty 1

## 2019-08-11 MED ORDER — BUPIVACAINE-EPINEPHRINE (PF) 0.25% -1:200000 IJ SOLN: INTRAMUSCULAR | Status: DC | PRN

## 2019-08-11 MED ORDER — DOCUSATE SODIUM 100 MG PO CAPS: 100.0000 mg | ORAL_CAPSULE | Freq: Two times a day (BID) | ORAL | 0 refills | Status: DC

## 2019-08-11 MED ORDER — DOCUSATE SODIUM 100 MG PO CAPS
100.0000 mg | ORAL_CAPSULE | Freq: Two times a day (BID) | ORAL | Status: DC
  Administered 2019-08-11 – 2019-08-13 (×3): 100 mg via ORAL
  Filled 2019-08-11 (×4): qty 1

## 2019-08-11 MED ORDER — STERILE WATER FOR IRRIGATION IR SOLN
Status: DC | PRN
  Administered 2019-08-11: 2000 mL

## 2019-08-11 MED ORDER — FENTANYL CITRATE (PF) 100 MCG/2ML IJ SOLN
INTRAMUSCULAR | Status: DC | PRN
  Administered 2019-08-11 (×2): 25 ug via INTRAVENOUS
  Administered 2019-08-11: 50 ug via INTRAVENOUS
  Administered 2019-08-11: 25 ug via INTRAVENOUS
  Administered 2019-08-11: 50 ug via INTRAVENOUS
  Administered 2019-08-11: 25 ug via INTRAVENOUS

## 2019-08-11 MED ORDER — POLYETHYLENE GLYCOL 3350 17 G PO PACK: 17.0000 g | PACK | Freq: Two times a day (BID) | ORAL | 0 refills | Status: DC

## 2019-08-11 MED ORDER — DEXAMETHASONE SODIUM PHOSPHATE 10 MG/ML IJ SOLN: 10.0000 mg | Freq: Once | INTRAMUSCULAR | Status: DC

## 2019-08-11 MED ORDER — METOCLOPRAMIDE HCL 5 MG PO TABS
5.0000 mg | ORAL_TABLET | Freq: Three times a day (TID) | ORAL | Status: DC | PRN
  Filled 2019-08-11: qty 2

## 2019-08-11 MED ORDER — METOCLOPRAMIDE HCL 5 MG/ML IJ SOLN: 5.0000 mg | Freq: Three times a day (TID) | INTRAMUSCULAR | Status: DC | PRN

## 2019-08-11 MED ORDER — PHENYLEPHRINE HCL (PRESSORS) 10 MG/ML IV SOLN
INTRAVENOUS | Status: DC | PRN
  Administered 2019-08-11 (×2): 80 ug via INTRAVENOUS

## 2019-08-11 MED ORDER — TRANEXAMIC ACID-NACL 1000-0.7 MG/100ML-% IV SOLN
1000.0000 mg | INTRAVENOUS | Status: AC
  Administered 2019-08-11: 1000 mg via INTRAVENOUS
  Filled 2019-08-11: qty 100

## 2019-08-11 MED ORDER — SODIUM CHLORIDE (PF) 0.9 % IJ SOLN
INTRAMUSCULAR | Status: AC
  Filled 2019-08-11: qty 50

## 2019-08-11 MED ORDER — ROPIVACAINE HCL 7.5 MG/ML IJ SOLN
INTRAMUSCULAR | Status: DC | PRN
  Administered 2019-08-11: 20 mL via PERINEURAL

## 2019-08-11 MED ORDER — MIDAZOLAM HCL 2 MG/2ML IJ SOLN
INTRAMUSCULAR | Status: AC
  Filled 2019-08-11: qty 2

## 2019-08-11 MED ORDER — HYDROCODONE-ACETAMINOPHEN 7.5-325 MG PO TABS
1.0000 | ORAL_TABLET | ORAL | Status: DC | PRN
  Administered 2019-08-11: 1 via ORAL
  Administered 2019-08-12: 2 via ORAL
  Filled 2019-08-11: qty 2
  Filled 2019-08-11: qty 1

## 2019-08-11 MED ORDER — MEPERIDINE HCL 50 MG/ML IJ SOLN: 6.2500 mg | INTRAMUSCULAR | Status: DC | PRN

## 2019-08-11 MED ORDER — CEFAZOLIN SODIUM-DEXTROSE 2-4 GM/100ML-% IV SOLN
2.0000 g | Freq: Four times a day (QID) | INTRAVENOUS | Status: AC
  Administered 2019-08-11 (×2): 2 g via INTRAVENOUS
  Filled 2019-08-11: qty 100

## 2019-08-11 MED ORDER — PROPOFOL 10 MG/ML IV BOLUS
INTRAVENOUS | Status: DC | PRN
  Administered 2019-08-11: 20 mg via INTRAVENOUS
  Administered 2019-08-11: 200 mg via INTRAVENOUS

## 2019-08-11 MED ORDER — ONDANSETRON HCL 4 MG/2ML IJ SOLN
INTRAMUSCULAR | Status: AC
  Filled 2019-08-11: qty 2

## 2019-08-11 MED ORDER — TRANEXAMIC ACID-NACL 1000-0.7 MG/100ML-% IV SOLN: 1000.0000 mg | Freq: Once | INTRAVENOUS | Status: DC

## 2019-08-11 MED ORDER — ALUM & MAG HYDROXIDE-SIMETH 200-200-20 MG/5ML PO SUSP
15.0000 mL | ORAL | Status: DC | PRN
  Filled 2019-08-11: qty 30

## 2019-08-11 MED ORDER — POVIDONE-IODINE 10 % EX SWAB: 2.0000 "application " | Freq: Once | CUTANEOUS | Status: DC

## 2019-08-11 MED ORDER — 0.9 % SODIUM CHLORIDE (POUR BTL) OPTIME
TOPICAL | Status: DC | PRN
  Administered 2019-08-11: 1000 mL

## 2019-08-11 MED ORDER — METHOCARBAMOL 500 MG PO TABS
500.0000 mg | ORAL_TABLET | Freq: Four times a day (QID) | ORAL | Status: DC | PRN
  Administered 2019-08-12: 500 mg via ORAL
  Filled 2019-08-11 (×2): qty 1

## 2019-08-11 MED ORDER — FENTANYL CITRATE (PF) 100 MCG/2ML IJ SOLN
25.0000 ug | INTRAMUSCULAR | Status: DC | PRN
  Administered 2019-08-11: 50 ug via INTRAVENOUS

## 2019-08-11 MED ORDER — HYDROCODONE-ACETAMINOPHEN 5-325 MG PO TABS: 1.0000 | ORAL_TABLET | ORAL | 0 refills | Status: DC | PRN

## 2019-08-11 MED ORDER — BISACODYL 10 MG RE SUPP
10.0000 mg | Freq: Every day | RECTAL | Status: DC | PRN
  Filled 2019-08-11: qty 1

## 2019-08-11 MED ORDER — FERROUS SULFATE 325 (65 FE) MG PO TABS: 325.0000 mg | ORAL_TABLET | Freq: Three times a day (TID) | ORAL | 0 refills | Status: DC

## 2019-08-11 MED ORDER — TRANEXAMIC ACID-NACL 1000-0.7 MG/100ML-% IV SOLN: 1000.0000 mg | INTRAVENOUS | Status: DC

## 2019-08-11 MED ORDER — LISINOPRIL-HYDROCHLOROTHIAZIDE 20-25 MG PO TABS: 1.0000 | ORAL_TABLET | Freq: Every day | ORAL | Status: DC

## 2019-08-11 MED ORDER — ONDANSETRON HCL 4 MG/2ML IJ SOLN
INTRAMUSCULAR | Status: DC | PRN
  Administered 2019-08-11: 4 mg via INTRAVENOUS

## 2019-08-11 MED ORDER — POVIDONE-IODINE 10 % EX SWAB
2.0000 "application " | Freq: Once | CUTANEOUS | Status: AC
  Administered 2019-08-11: 2 via TOPICAL

## 2019-08-11 MED ORDER — METHOCARBAMOL 500 MG PO TABS: 500.0000 mg | ORAL_TABLET | Freq: Four times a day (QID) | ORAL | 0 refills | Status: DC | PRN

## 2019-08-11 MED ORDER — DIPHENHYDRAMINE HCL 12.5 MG/5ML PO ELIX
12.5000 mg | ORAL_SOLUTION | ORAL | Status: DC | PRN
  Filled 2019-08-11: qty 10

## 2019-08-11 MED ORDER — LACTATED RINGERS IV BOLUS
250.0000 mL | Freq: Once | INTRAVENOUS | Status: AC
  Administered 2019-08-11: 250 mL via INTRAVENOUS

## 2019-08-11 MED ORDER — MENTHOL 3 MG MT LOZG
1.0000 | LOZENGE | OROMUCOSAL | Status: DC | PRN
  Filled 2019-08-11: qty 9

## 2019-08-11 MED ORDER — PROPOFOL 10 MG/ML IV BOLUS
INTRAVENOUS | Status: AC
  Filled 2019-08-11: qty 40

## 2019-08-11 MED ORDER — FENTANYL CITRATE (PF) 100 MCG/2ML IJ SOLN
INTRAMUSCULAR | Status: AC
  Filled 2019-08-11: qty 2

## 2019-08-11 MED ORDER — HYDROMORPHONE HCL 1 MG/ML IJ SOLN: 0.5000 mg | INTRAMUSCULAR | Status: DC | PRN

## 2019-08-11 MED ORDER — ACETAMINOPHEN 325 MG PO TABS: 325.0000 mg | ORAL_TABLET | Freq: Four times a day (QID) | ORAL | Status: DC | PRN

## 2019-08-11 MED ORDER — DEXAMETHASONE SODIUM PHOSPHATE 10 MG/ML IJ SOLN
10.0000 mg | Freq: Once | INTRAMUSCULAR | Status: AC
  Administered 2019-08-12: 10 mg via INTRAVENOUS
  Filled 2019-08-11: qty 1

## 2019-08-11 MED ORDER — LISINOPRIL 20 MG PO TABS
20.0000 mg | ORAL_TABLET | Freq: Every day | ORAL | Status: DC
  Administered 2019-08-13: 20 mg via ORAL
  Filled 2019-08-11 (×2): qty 1

## 2019-08-11 MED ORDER — LACTATED RINGERS IV BOLUS
500.0000 mL | Freq: Once | INTRAVENOUS | Status: AC
  Administered 2019-08-11: 500 mL via INTRAVENOUS

## 2019-08-11 MED ORDER — CEFAZOLIN SODIUM-DEXTROSE 2-4 GM/100ML-% IV SOLN
2.0000 g | INTRAVENOUS | Status: AC
  Administered 2019-08-11: 2 g via INTRAVENOUS
  Filled 2019-08-11: qty 100

## 2019-08-11 MED ORDER — BUPIVACAINE HCL 0.25 % IJ SOLN
INTRAMUSCULAR | Status: AC
  Filled 2019-08-11: qty 1

## 2019-08-11 MED ORDER — KETOROLAC TROMETHAMINE 30 MG/ML IJ SOLN
INTRAMUSCULAR | Status: DC | PRN
  Administered 2019-08-11: 30 mg

## 2019-08-11 MED ORDER — SODIUM CHLORIDE 0.9 % IV SOLN: INTRAVENOUS | Status: DC

## 2019-08-11 MED ORDER — HYDROCODONE-ACETAMINOPHEN 5-325 MG PO TABS
1.0000 | ORAL_TABLET | ORAL | Status: DC | PRN
  Administered 2019-08-11: 1 via ORAL

## 2019-08-11 MED ORDER — PROPOFOL 500 MG/50ML IV EMUL
INTRAVENOUS | Status: AC
  Filled 2019-08-11: qty 50

## 2019-08-11 MED ORDER — KETOROLAC TROMETHAMINE 30 MG/ML IJ SOLN
INTRAMUSCULAR | Status: AC
  Filled 2019-08-11: qty 1

## 2019-08-11 MED ORDER — POLYETHYLENE GLYCOL 3350 17 G PO PACK
17.0000 g | PACK | Freq: Two times a day (BID) | ORAL | Status: DC
  Administered 2019-08-12 – 2019-08-13 (×2): 17 g via ORAL
  Filled 2019-08-11 (×3): qty 1

## 2019-08-11 MED ORDER — BUPIVACAINE HCL (PF) 0.25 % IJ SOLN
INTRAMUSCULAR | Status: DC | PRN
  Administered 2019-08-11: 30 mL

## 2019-08-11 MED ORDER — FENTANYL CITRATE (PF) 100 MCG/2ML IJ SOLN: 50.0000 ug | INTRAMUSCULAR | Status: DC

## 2019-08-11 MED ORDER — ONDANSETRON HCL 4 MG/2ML IJ SOLN: 4.0000 mg | Freq: Once | INTRAMUSCULAR | Status: DC | PRN

## 2019-08-11 MED ORDER — HYDROCODONE-ACETAMINOPHEN 7.5-325 MG PO TABS
ORAL_TABLET | ORAL | Status: AC
  Filled 2019-08-11: qty 1

## 2019-08-11 MED ORDER — METHOCARBAMOL 500 MG IVPB - SIMPLE MED
500.0000 mg | Freq: Four times a day (QID) | INTRAVENOUS | Status: DC | PRN
  Administered 2019-08-11: 500 mg via INTRAVENOUS
  Filled 2019-08-11: qty 50

## 2019-08-11 MED ORDER — MIDAZOLAM HCL 5 MG/5ML IJ SOLN
INTRAMUSCULAR | Status: DC | PRN
  Administered 2019-08-11 (×2): 1 mg via INTRAVENOUS

## 2019-08-11 MED ORDER — ONDANSETRON HCL 4 MG PO TABS
4.0000 mg | ORAL_TABLET | Freq: Four times a day (QID) | ORAL | Status: DC | PRN
  Administered 2019-08-13: 4 mg via ORAL
  Filled 2019-08-11 (×2): qty 1

## 2019-08-11 MED ORDER — MEPIVACAINE HCL (PF) 2 % IJ SOLN
INTRAMUSCULAR | Status: AC
  Filled 2019-08-11: qty 20

## 2019-08-11 MED ORDER — ONDANSETRON HCL 4 MG/2ML IJ SOLN
4.0000 mg | Freq: Four times a day (QID) | INTRAMUSCULAR | Status: DC | PRN
  Administered 2019-08-11 – 2019-08-12 (×2): 4 mg via INTRAVENOUS
  Filled 2019-08-11: qty 2

## 2019-08-11 MED ORDER — ONDANSETRON HCL 4 MG PO TABS: 4.0000 mg | ORAL_TABLET | Freq: Three times a day (TID) | ORAL | 0 refills | Status: DC | PRN

## 2019-08-11 MED ORDER — DEXAMETHASONE SODIUM PHOSPHATE 10 MG/ML IJ SOLN
10.0000 mg | Freq: Once | INTRAMUSCULAR | Status: AC
  Administered 2019-08-11: 10 mg via INTRAVENOUS

## 2019-08-11 SURGICAL SUPPLY — 63 items
ATTUNE MED ANAT PAT 38 KNEE (Knees) ×2 IMPLANT
ATTUNE PS FEM LT SZ 7 CEM KNEE (Femur) ×2 IMPLANT
ATTUNE PSRP INSR SZ7 7 KNEE (Insert) ×2 IMPLANT
BAG ZIPLOCK 12X15 (MISCELLANEOUS) ×2 IMPLANT
BASE TIBIAL ROT PLAT SZ 8 KNEE (Knees) ×1 IMPLANT
BLADE SAW SGTL 11.0X1.19X90.0M (BLADE) IMPLANT
BLADE SAW SGTL 13.0X1.19X90.0M (BLADE) ×2 IMPLANT
BLADE SURG SZ10 CARB STEEL (BLADE) ×4 IMPLANT
BNDG COHESIVE 3X5 TAN STRL LF (GAUZE/BANDAGES/DRESSINGS) ×2 IMPLANT
BNDG ELASTIC 6X5.8 VLCR STR LF (GAUZE/BANDAGES/DRESSINGS) ×2 IMPLANT
BOWL SMART MIX CTS (DISPOSABLE) ×2 IMPLANT
CEMENT HV SMART SET (Cement) ×4 IMPLANT
COVER SURGICAL LIGHT HANDLE (MISCELLANEOUS) ×2 IMPLANT
COVER WAND RF STERILE (DRAPES) IMPLANT
CUFF TOURN SGL QUICK 34 (TOURNIQUET CUFF) ×1
CUFF TRNQT CYL 34X4.125X (TOURNIQUET CUFF) ×1 IMPLANT
DECANTER SPIKE VIAL GLASS SM (MISCELLANEOUS) ×4 IMPLANT
DERMABOND ADVANCED (GAUZE/BANDAGES/DRESSINGS) ×1
DERMABOND ADVANCED .7 DNX12 (GAUZE/BANDAGES/DRESSINGS) ×1 IMPLANT
DRAPE U-SHAPE 47X51 STRL (DRAPES) ×2 IMPLANT
DRESSING AQUACEL AG SP 3.5X10 (GAUZE/BANDAGES/DRESSINGS) ×1 IMPLANT
DRSG AQUACEL AG ADV 3.5X10 (GAUZE/BANDAGES/DRESSINGS) ×2 IMPLANT
DRSG AQUACEL AG SP 3.5X10 (GAUZE/BANDAGES/DRESSINGS) ×2
DURAPREP 26ML APPLICATOR (WOUND CARE) ×4 IMPLANT
ELECT REM PT RETURN 15FT ADLT (MISCELLANEOUS) ×2 IMPLANT
FACESHIELD WRAPAROUND (MASK) ×10 IMPLANT
GLOVE BIO SURGEON STRL SZ 6 (GLOVE) ×4 IMPLANT
GLOVE BIOGEL PI IND STRL 6.5 (GLOVE) ×1 IMPLANT
GLOVE BIOGEL PI IND STRL 7.5 (GLOVE) ×2 IMPLANT
GLOVE BIOGEL PI IND STRL 8.5 (GLOVE) IMPLANT
GLOVE BIOGEL PI INDICATOR 6.5 (GLOVE) ×1
GLOVE BIOGEL PI INDICATOR 7.5 (GLOVE) ×2
GLOVE BIOGEL PI INDICATOR 8.5 (GLOVE)
GLOVE ECLIPSE 8.0 STRL XLNG CF (GLOVE) IMPLANT
GLOVE ORTHO TXT STRL SZ7.5 (GLOVE) ×2 IMPLANT
GOWN STRL REUS W/ TWL LRG LVL3 (GOWN DISPOSABLE) ×1 IMPLANT
GOWN STRL REUS W/TWL 2XL LVL3 (GOWN DISPOSABLE) ×2 IMPLANT
GOWN STRL REUS W/TWL LRG LVL3 (GOWN DISPOSABLE) ×1 IMPLANT
HANDPIECE INTERPULSE COAX TIP (DISPOSABLE) ×1
HOLDER FOLEY CATH W/STRAP (MISCELLANEOUS) IMPLANT
KIT TURNOVER KIT A (KITS) IMPLANT
MANIFOLD NEPTUNE II (INSTRUMENTS) ×2 IMPLANT
NDL SAFETY ECLIPSE 18X1.5 (NEEDLE) ×1 IMPLANT
NEEDLE HYPO 18GX1.5 SHARP (NEEDLE) ×1
NS IRRIG 1000ML POUR BTL (IV SOLUTION) ×2 IMPLANT
PACK TOTAL KNEE CUSTOM (KITS) ×2 IMPLANT
PENCIL SMOKE EVACUATOR (MISCELLANEOUS) ×2 IMPLANT
PIN DRILL FIX HALF THREAD (BIT) ×2 IMPLANT
PIN FIX SIGMA LCS THRD HI (PIN) ×2 IMPLANT
PROTECTOR NERVE ULNAR (MISCELLANEOUS) ×2 IMPLANT
SET HNDPC FAN SPRY TIP SCT (DISPOSABLE) ×1 IMPLANT
SET PAD KNEE POSITIONER (MISCELLANEOUS) ×2 IMPLANT
SUT MNCRL AB 4-0 PS2 18 (SUTURE) ×2 IMPLANT
SUT STRATAFIX PDS+ 0 24IN (SUTURE) ×2 IMPLANT
SUT VIC AB 1 CT1 36 (SUTURE) ×2 IMPLANT
SUT VIC AB 2-0 CT1 27 (SUTURE) ×3
SUT VIC AB 2-0 CT1 TAPERPNT 27 (SUTURE) ×3 IMPLANT
SYR 3ML LL SCALE MARK (SYRINGE) ×2 IMPLANT
TIBIAL BASE ROT PLAT SZ 8 KNEE (Knees) ×2 IMPLANT
TRAY FOLEY MTR SLVR 16FR STAT (SET/KITS/TRAYS/PACK) ×2 IMPLANT
WATER STERILE IRR 1000ML POUR (IV SOLUTION) ×4 IMPLANT
WRAP KNEE MAXI GEL POST OP (GAUZE/BANDAGES/DRESSINGS) ×2 IMPLANT
YANKAUER SUCT BULB TIP 10FT TU (MISCELLANEOUS) ×2 IMPLANT

## 2019-08-11 NOTE — Addendum Note (Signed)
Addendum  created 08/11/19 1145 by West Pugh, CRNA   Charge Capture section accepted

## 2019-08-11 NOTE — Discharge Instructions (Signed)

## 2019-08-11 NOTE — Care Management CC44 (Signed)
Condition Code 44 Documentation Completed  Patient Details  Name: ARNES GRANUM MRN: [id number] Date of Birth: 01-Feb-1956   Condition Code 44 given:    Patient signature on Condition Code 44 notice:    Documentation of 2 MD's agreement:    Code 44 added to claim:       Claudine Mouton, LCSW 08/11/2019, 9:03 PM

## 2019-08-11 NOTE — Progress Notes (Signed)
Physical Therapy Treatment Patient Details Name: Brandon Marks MRN: [id number] DOB: 1955/12/15 Today's Date: 08/11/2019    History of Present Illness Patient is 64 y.o. male s/p Lt TKA on 08/11/19 with PMH significant for perirectal abscess, HTN, gastic bypass surgery.    PT Comments    Patient seen for additional therapy session to progress mobility. Pt was able to complete bed mobility and sit<>stand transfer with RW. E required min assist for transfer and to perform short forward steps by EOB. He was limited this session by nausea and dizziness after standing. BP was assessed in upon sitting initially and was 128/90 mmHg, pressure dropped to 102/70 mm Hg after standing. Pt returned to supine and educated on exercise for ROM and circulation. He will continue to benefit from skilled PT interventions to address impairments and progress independence with mobility. Acute PT will progress as able.   Follow Up Recommendations  Follow surgeon's recommendation for DC plan and follow-up therapies     Equipment Recommendations  Rolling walker with 5" wheels(delivered to pt in PACU)    Recommendations for Other Services       Precautions / Restrictions Precautions Precautions: Fall Restrictions Weight Bearing Restrictions: No    Mobility  Bed Mobility Overal bed mobility: Needs Assistance Bed Mobility: Supine to Sit;Sit to Supine     Supine to sit: HOB elevated;Supervision Sit to supine: Supervision;HOB elevated   General bed mobility comments: no assist for supine<>sit transfers  Transfers Overall transfer level: Needs assistance Equipment used: Rolling walker (2 wheeled) Transfers: Sit to/from Stand Sit to Stand: Min guard         General transfer comment: pt required direct cues for safe hand placement and steady assist to rise.  Ambulation/Gait Ambulation/Gait assistance: Min assist Gait Distance (Feet): 3 Feet Assistive device: Rolling walker (2 wheeled) Gait  Pattern/deviations: Step-to pattern;Decreased stride length;Decreased stance time - left;Decreased step length - right Gait velocity: decreased   General Gait Details: cues for safe hand placement and step pattern in RW. pt requried min assist to steady and guarding at Lt knee to prevent buckling.   Stairs             Wheelchair Mobility    Modified Rankin (Stroke Patients Only)       Balance Overall balance assessment: Needs assistance Sitting-balance support: Feet supported Sitting balance-Leahy Scale: Good     Standing balance support: During functional activity;Bilateral upper extremity supported Standing balance-Leahy Scale: Fair             Cognition Arousal/Alertness: Awake/alert Behavior During Therapy: WFL for tasks assessed/performed Overall Cognitive Status: Within Functional Limits for tasks assessed           Exercises Total Joint Exercises Ankle Circles/Pumps: AROM;10 reps;Supine;Both Quad Sets: AROM;10 reps;Supine;Left Heel Slides: AAROM;10 reps;Supine;Left    General Comments        Pertinent Vitals/Pain Pain Assessment: Faces Faces Pain Scale: Hurts a little bit Pain Location: Lt knee Pain Descriptors / Indicators: Grimacing;Sore Pain Intervention(s): Repositioned;Limited activity within patient's tolerance           PT Goals (current goals can now be found in the care plan section) Acute Rehab PT Goals Patient Stated Goal: to go home today PT Goal Formulation: With patient Time For Goal Achievement: 08/18/19 Potential to Achieve Goals: Good Progress towards PT goals: Progressing toward goals    Frequency    7X/week      PT Plan Current plan remains appropriate  AM-PAC PT "6 Clicks" Mobility   Outcome Measure  Help needed turning from your back to your side while in a flat bed without using bedrails?: None Help needed moving from lying on your back to sitting on the side of a flat bed without using bedrails?:  None Help needed moving to and from a bed to a chair (including a wheelchair)?: A Little Help needed standing up from a chair using your arms (e.g., wheelchair or bedside chair)?: A Little Help needed to walk in hospital room?: A Little Help needed climbing 3-5 steps with a railing? : A Little 6 Click Score: 20    End of Session Equipment Utilized During Treatment: Gait belt Activity Tolerance: Treatment limited secondary to medical complications (Comment)(nausea) Patient left: in bed;with call bell/phone within reach;with nursing/sitter in room Nurse Communication: Mobility status PT Visit Diagnosis: Muscle weakness (generalized) (M62.81);Difficulty in walking, not elsewhere classified (R26.2)     Time: [id number] PT Time Calculation (min) (ACUTE ONLY): 23 min  Charges:  $Therapeutic Exercise: 8-22 mins $Therapeutic Activity: 8-22 mins                     Verner Mould, DPT Physical Therapist with [facility] [phone]  08/11/2019 6:09 PM

## 2019-08-11 NOTE — Anesthesia Procedure Notes (Signed)
Procedure Name: LMA Insertion Date/Time: 08/11/2019 7:42 AM Performed by: West Pugh, CRNA Pre-anesthesia Checklist: Patient identified, Emergency Drugs available, Suction available, Patient being monitored and Timeout performed Patient Re-evaluated:Patient Re-evaluated prior to induction Oxygen Delivery Method: Circle system utilized Preoxygenation: Pre-oxygenation with 100% oxygen Induction Type: IV induction LMA: LMA inserted LMA Size: 5.0 Number of attempts: 1 Placement Confirmation: positive ETCO2 and breath sounds checked- equal and bilateral Tube secured with: Tape Dental Injury: Teeth and Oropharynx as per pre-operative assessment  Comments: Head and neck movement limited. Prior to induction. Patient allowed to rest head and neck on pillow with head foam. Patient reported that he was comfortable. Head, neck and spine maintained in neutral alignment during placement of LMA.

## 2019-08-11 NOTE — Anesthesia Procedure Notes (Addendum)
Anesthesia Regional Block: Adductor canal block   Pre-Anesthetic Checklist: ,, timeout performed, Correct Patient, Correct Site, Correct Laterality, Correct Procedure, Correct Position, site marked, Risks and benefits discussed,  Surgical consent,  Pre-op evaluation,  At surgeon's request and post-op pain management  Laterality: Left  Prep: chloraprep       Needles:  Injection technique: Single-shot  Needle Type: Echogenic Stimulator Needle     Needle Length: 9cm  Needle Gauge: 21   Needle insertion depth: 6 cm   Additional Needles:   Procedures:,,,, ultrasound used (permanent image in chart),,,,  Narrative:  Start time: 08/11/2019 6:55 AM End time: 08/11/2019 7:00 AM Injection made incrementally with aspirations every 5 mL.  Performed by: Personally  Anesthesiologist: Josephine Igo, MD  Additional Notes: Timeout performed. Patient sedated. Relevant anatomy ID'd using [place]. Incremental 2-84ml injection of LA with frequent aspiration. Patient tolerated procedure well.        Left Adductor Canal Block

## 2019-08-11 NOTE — Transfer of Care (Signed)
Immediate Anesthesia Transfer of Care Note  Patient: Brandon Marks  Procedure(s) Performed: TOTAL KNEE ARTHROPLASTY (Left Knee)  Patient Location: PACU  Anesthesia Type:GA combined with regional for post-op pain  Level of Consciousness: awake, drowsy and patient cooperative  Airway & Oxygen Therapy: Patient Spontanous Breathing and Patient connected to face mask oxygen  Post-op Assessment: Report given to RN and Post -op Vital signs reviewed and stable  Post vital signs: Reviewed and stable  Last Vitals:  Vitals Value Taken Time  BP 135/85 08/11/19 0950  Temp    Pulse 70 08/11/19 0953  Resp 23 08/11/19 0953  SpO2 100 % 08/11/19 0953  Vitals shown include unvalidated device data.  Last Pain:  Vitals:   08/11/19 0607  TempSrc: Oral      Patients Stated Pain Goal: 4 ([id number])  Complications: No apparent anesthesia complications

## 2019-08-11 NOTE — Anesthesia Postprocedure Evaluation (Signed)
Anesthesia Post Note  Patient: Brandon Marks  Procedure(s) Performed: TOTAL KNEE ARTHROPLASTY (Left Knee)     Patient location during evaluation: PACU Anesthesia Type: General Level of consciousness: awake and alert and oriented Pain management: pain level controlled Vital Signs Assessment: post-procedure vital signs reviewed and stable Respiratory status: spontaneous breathing, nonlabored ventilation and respiratory function stable Cardiovascular status: blood pressure returned to baseline and stable Postop Assessment: no apparent nausea or vomiting Anesthetic complications: no    Last Vitals:  Vitals:   08/11/19 1015 08/11/19 1030  BP: 128/66 136/89  Pulse: (!) 29 70  Resp: 14 14  Temp:  (!) 36.4 C  SpO2: 100% 100%    Last Pain:  Vitals:   08/11/19 1030  TempSrc:   PainSc: 3                  Libby Goehring A.

## 2019-08-11 NOTE — Progress Notes (Signed)
CSW consulted with the 2nd shift TOC RN CM covering from [facility] ED who "walked" the CSW through setting up, creating and "auto-populating" the letter which the CSW delivered to and read to the pt.  Pt signed the letter after it was explained, that pt was now under observation/outpatient status, etc, and CSW made a copy which the pt handed back to the pt.  Pt was appreciative and thanked the CSW.  CSW then delivered the signed original letter, along with pt's facesheet to the ED and placed it into the basket in the presence of the ED Secretary Lattie Haw, which is picked up daily so that documents can be scanned into the pt's chart.  Please reconsult if future social work needs arise.  CSW signing off, as social work intervention is no longer needed.  Alphonse Guild. Carolynn Tuley, LCSW, LCAS, CSI Transitions of Care Clinical Social Worker Care Coordination Department Ph: [phone]

## 2019-08-11 NOTE — Progress Notes (Signed)
CSW received a call from [place] with UR who stated pt needs his MOON letter read to him.  Magda Paganini with UR suggested that CSW consult the [facility] RN CM covering [facility] today and seek assistance with delivering pt pt's MOON letter.  CSW will continue to follow for D/C needs.  Alphonse Guild. Wanetta Funderburke, LCSW, LCAS, CSI Transitions of Care Clinical Social Worker Care Coordination Department Ph: [phone]

## 2019-08-11 NOTE — Op Note (Signed)
NAME:  Brandon Marks RECORD NO.:  [id number]                             FACILITY:  [facility]      PHYSICIAN:  Pietro Cassis. Alvan Dame, M.D.  DATE OF BIRTH:  1956-01-03      DATE OF PROCEDURE:  08/11/2019                                     OPERATIVE REPORT         PREOPERATIVE DIAGNOSIS:  Left knee osteoarthritis.      POSTOPERATIVE DIAGNOSIS:  Left knee osteoarthritis.      FINDINGS:  The patient was noted to have complete loss of cartilage and   bone-on-bone arthritis with associated osteophytes in the medial and patellofemoral compartments of   the knee with a significant synovitis and associated effusion.  The patient had failed months of conservative treatment including medications, injection therapy, activity modification.     PROCEDURE:  Left total knee replacement.      COMPONENTS USED:  DePuy Attune rotating platform posterior stabilized knee   system, a size 7 femur, 8 tibia, size 7 mm PS AOX insert, and 38 anatomic patellar   button.      SURGEON:  Pietro Cassis. Alvan Dame, M.D.      ASSISTANT:  Griffith Citron, PA-C.      ANESTHESIA:  General and Regional.      SPECIMENS:  None.      COMPLICATION:  None.      DRAINS:  None.  EBL: <100cc      TOURNIQUET TIME:   Total Tourniquet Time Documented: Thigh (Left) - 49 minutes Total: Thigh (Left) - 49 minutes  .      The patient was stable to the recovery room.      INDICATION FOR PROCEDURE:  Brandon Marks is a 64 y.o. male patient of   mine.  The patient had been seen, evaluated, and treated for months conservatively in the   office with medication, activity modification, and injections.  The patient had   radiographic changes of bone-on-bone arthritis with endplate sclerosis and osteophytes noted.  Based on the radiographic changes and failed conservative measures, the patient   decided to proceed with definitive treatment, total knee replacement.  Risks of infection, DVT, component failure, need  for revision surgery, neurovascular injury were reviewed in the office setting.  The postop course was reviewed stressing the efforts to maximize post-operative satisfaction and function.  Consent was obtained for benefit of pain   relief.      PROCEDURE IN DETAIL:  The patient was brought to the operative theater.   Once adequate anesthesia, preoperative antibiotics, 2 gm of Ancef,1 gm of Tranexamic Acid, and 10 mg of Decadron administered, the patient was positioned supine with a left thigh tourniquet placed.  The  left lower extremity was prepped and draped in sterile fashion.  A time-   out was performed identifying the patient, planned procedure, and the appropriate extremity.      The left lower extremity was placed in the [facility] leg holder.  The leg was   exsanguinated, tourniquet elevated to 250 mmHg.  A midline incision was   made  followed by median parapatellar arthrotomy.  Following initial   exposure, attention was first directed to the patella.  Precut   measurement was noted to be 26 mm.  I resected down to 14-15 mm and used a   38 anatomic patellar button to restore patellar height as well as cover the cut surface.      The lug holes were drilled and a metal shim was placed to protect the   patella from retractors and saw blade during the procedure.      At this point, attention was now directed to the femur.  The femoral   canal was opened with a drill, irrigated to try to prevent fat emboli.  An   intramedullary rod was passed at 5 degrees valgus, 10 mm of bone was   resected off the distal femur to pre-op flexion contracture.  Following this resection, the tibia was   subluxated anteriorly.  Using the extramedullary guide, 2 mm of bone was resected off   the proximal medial tibia.  We confirmed the gap would be   stable medially and laterally with a size 5 spacer block as well as confirmed that the tibial cut was perpendicular in the coronal plane, checking with an alignment  rod.      Once this was done, I sized the femur to be a size 7 in the anterior-   posterior dimension, chose a standard component based on medial and   lateral dimension.  The size 7 rotation block was then pinned in   position anterior referenced using the C-clamp to set rotation.  The   anterior, posterior, and  chamfer cuts were made without difficulty nor   notching making certain that I was along the anterior cortex to help   with flexion gap stability.      The final box cut was made off the lateral aspect of distal femur. I then use the laminar spreaders to open the knee in flexion in order to remove posterior osteophytes.     At this point, the tibia was sized to be a size 8.  The size 8 tray was   then pinned in position through the medial third of the tubercle,   drilled, and keel punched.  Trial reduction was now carried with a 7 femur,  8 tibia, a size 7 mm PS insert, and the 38 anatomic patella botton.  The knee was brought to full extension with good flexion stability with the patella   tracking through the trochlea without application of pressure.  Given   all these findings the trial components removed.  Final components were   opened and cement was mixed.  The knee was irrigated with normal saline solution and pulse lavage.  The synovial lining was   then injected with 30 cc of 0.25% Marcaine with epinephrine, 1 cc of Toradol and 30 cc of NS for a total of 61 cc.     Final implants were then cemented onto cleaned and dried cut surfaces of bone with the knee brought to extension with a size 7 mm PS trial insert.      Once the cement had fully cured, excess cement was removed   throughout the knee.  I confirmed that I was satisfied with the range of   motion and stability, and the final size 7 mm PS AOX insert was chosen.  It was   placed into the knee.      The tourniquet had been let down at  49 minutes.  No significant   hemostasis was required.  The extensor mechanism  was then reapproximated using #1 Vicryl and #1 Stratafix sutures with the knee   in flexion.  The   remaining wound was closed with 2-0 Vicryl and running 4-0 Monocryl.   The knee was cleaned, dried, dressed sterilely using Dermabond and   Aquacel dressing.  The patient was then   brought to recovery room in stable condition, tolerating the procedure   well.   Please note that Physician Assistant, Griffith Citron, PA-C was present for the entirety of the case, and was utilized for pre-operative positioning, peri-operative retractor management, general facilitation of the procedure and for primary wound closure at the end of the case.              Pietro Cassis Alvan Dame, M.D.    08/11/2019 9:14 AM

## 2019-08-11 NOTE — Evaluation (Signed)
Physical Therapy Evaluation Patient Details Name: Brandon Marks MRN: [id number] DOB: [birth year] Today's Date: 08/11/2019   History of Present Illness  Patient is 64 y.o. male s/p Lt TKA on 08/11/19 with PMH significant for perirectal abscess, HTN, gastic bypass surgery.  Clinical Impression  Brandon Marks is a 64 y.o. male POD 0 s/p Lt TKA. Patient reports independence with occasional use of SPC for mobility at baseline. Patient is now limited by functional impairments (see PT problem list below) and will benefit from continued skilled PT interventions to address impairments and progress towards PLOF. He was limited with evaluation by nausea, and lightheadedness with supine to sit and declined to progress mobility or perform seated and supine exercises. Will follow up for additional session to determine if patient is able to further mobility in preparation for discharge home. Acute PT will follow to progress mobility and stair training in preparation for safe discharge home.     Follow Up Recommendations Follow surgeon's recommendation for DC plan and follow-up therapies    Equipment Recommendations  Rolling walker with 5" wheels(delivered to pt in PACU)    Recommendations for Other Services       Precautions / Restrictions Precautions Precautions: Fall Restrictions Weight Bearing Restrictions: No      Mobility  Bed Mobility Overal bed mobility: Needs Assistance Bed Mobility: Supine to Sit;Sit to Supine     Supine to sit: HOB elevated;Supervision Sit to supine: Supervision;HOB elevated   General bed mobility comments: no cues or assist required, pt did not rely on bed rails. pt limited by nausea/"quezziness"  in prolonged sitting. BP was 158/91 in supine and dropped to 124/80 upon sitting. It remained stable at 125/85 and then 123/84 in sitting but pt reported increasing nausea and decline to stand or perform seated HEP.  Transfers           Ambulation/Gait        Stairs     Wheelchair Mobility    Modified Rankin (Stroke Patients Only)       Balance Overall balance assessment: Needs assistance   Sitting balance-Leahy Scale: Good            Pertinent Vitals/Pain Pain Assessment: Faces Faces Pain Scale: Hurts a little bit Pain Location: Lt knee Pain Descriptors / Indicators: Grimacing;Sore    Home Living Family/patient expects to be discharged to:: Private residence Living Arrangements: Parent;Other relatives(pt lives with his mother and aunt (both have alzheimers & demetia and he is their caregiver)) Available Help at Discharge: Available PRN/intermittently(his girlfriend can help some and his nephew) Type of Home: House Home Access: Stairs to enter Entrance Stairs-Rails: Left Entrance Stairs-Number of Steps: 2 Home Layout: One level Home Equipment: Shower seat - built in Additional Comments: pt believes his girlfriend will stay overnight to help him.    Prior Function Level of Independence: Independent with assistive device(s)         Comments: pt has been using SPC intermittently for gait due to knee pain     Hand Dominance   Dominant Hand: Right    Extremity/Trunk Assessment   Upper Extremity Assessment Upper Extremity Assessment: Overall WFL for tasks assessed    Lower Extremity Assessment Lower Extremity Assessment: LLE deficits/detail LLE Deficits / Details: good quad activation, no extensor lag with SLR. LLE Sensation: WNL LLE Coordination: WNL    Cervical / Trunk Assessment Cervical / Trunk Assessment: Normal  Communication   Communication: No difficulties  Cognition Arousal/Alertness: Awake/alert Behavior During Therapy: WFL for tasks  assessed/performed Overall Cognitive Status: Within Functional Limits for tasks assessed          General Comments: pt is alert and oriented x4, somewhat diffiuclt as he appears slightly impulsive and was not receptive to instructions for tasks during  session.             Assessment/Plan    PT Assessment Patient needs continued PT services  PT Problem List Decreased strength;Decreased range of motion;Decreased mobility;Decreased balance;Decreased activity tolerance;Decreased knowledge of use of DME       PT Treatment Interventions DME instruction;Functional mobility training;Balance training;Patient/family education;Gait training;Therapeutic activities;Therapeutic exercise;Stair training    PT Goals (Current goals can be found in the Care Plan section)  Acute Rehab PT Goals Patient Stated Goal: to go home today PT Goal Formulation: With patient Time For Goal Achievement: 08/18/19 Potential to Achieve Goals: Good    Frequency 7X/week    AM-PAC PT "6 Clicks" Mobility  Outcome Measure Help needed turning from your back to your side while in a flat bed without using bedrails?: None Help needed moving from lying on your back to sitting on the side of a flat bed without using bedrails?: None Help needed moving to and from a bed to a chair (including a wheelchair)?: A Little Help needed standing up from a chair using your arms (e.g., wheelchair or bedside chair)?: A Little Help needed to walk in hospital room?: A Little Help needed climbing 3-5 steps with a railing? : A Little 6 Click Score: 20    End of Session Equipment Utilized During Treatment: Gait belt Activity Tolerance: Treatment limited secondary to medical complications (Comment)(nausea) Patient left: in bed;with call bell/phone within reach;with nursing/sitter in room Nurse Communication: Mobility status PT Visit Diagnosis: Muscle weakness (generalized) (M62.81);Difficulty in walking, not elsewhere classified (R26.2)    Time: [id number] PT Time Calculation (min) (ACUTE ONLY): 26 min   Charges:   PT Evaluation $PT Eval Low Complexity: 1 Low         Verner Mould, DPT Physical Therapist with [facility] [phone]  08/11/2019 2:13  PM

## 2019-08-11 NOTE — Progress Notes (Addendum)
CSW received a call from the [facility] RN CM who stated pt's CODE 44 needed to be completed, as well.  CSW was walked through the process of creating and printing out the CODE 44, which CSW brought to there pt.  CSW read to and described to pt the purpose of and the content of the CODE 44.    Pt voiced understanding and signed the CODE 57 and thanked the CSW.  CSW then made a copy of the CODE 44 and provided one copy to the pt and placed the original signed by the pt into the basket in the ED for documents to be "scanned in" to the pt's chart.  CSW will continue to follow for D/C needs.  Brandon Marks. Brandon Winburn, LCSW, LCAS, CSI Transitions of Care Clinical Social Worker Care Coordination Department Ph: [phone]        s

## 2019-08-11 NOTE — Interval H&P Note (Signed)
History and Physical Interval Note:  08/11/2019 7:20 AM  Brandon Marks  has presented today for surgery, with the diagnosis of Left knee osteoarthritis.  The various methods of treatment have been discussed with the patient and family. After consideration of risks, benefits and other options for treatment, the patient has consented to  Procedure(s) with comments: TOTAL KNEE ARTHROPLASTY (Left) - 70 mins as a surgical intervention.  [name] history has been reviewed, patient examined, no change in status, stable for surgery.  I have reviewed the patient's chart and labs.  Questions were answered to the patient's satisfaction.     [name]

## 2019-08-12 ENCOUNTER — Encounter: Payer: Self-pay | Admitting: *Deleted

## 2019-08-12 DIAGNOSIS — M1712 Unilateral primary osteoarthritis, left knee: Secondary | ICD-10-CM | POA: Diagnosis not present

## 2019-08-12 DIAGNOSIS — Z6830 Body mass index (BMI) 30.0-30.9, adult: Secondary | ICD-10-CM | POA: Diagnosis not present

## 2019-08-12 DIAGNOSIS — E785 Hyperlipidemia, unspecified: Secondary | ICD-10-CM | POA: Diagnosis not present

## 2019-08-12 DIAGNOSIS — I10 Essential (primary) hypertension: Secondary | ICD-10-CM | POA: Diagnosis not present

## 2019-08-12 DIAGNOSIS — E7800 Pure hypercholesterolemia, unspecified: Secondary | ICD-10-CM | POA: Diagnosis not present

## 2019-08-12 DIAGNOSIS — Z96652 Presence of left artificial knee joint: Secondary | ICD-10-CM | POA: Diagnosis not present

## 2019-08-12 DIAGNOSIS — Z79899 Other long term (current) drug therapy: Secondary | ICD-10-CM | POA: Diagnosis not present

## 2019-08-12 DIAGNOSIS — Z9884 Bariatric surgery status: Secondary | ICD-10-CM | POA: Diagnosis not present

## 2019-08-12 DIAGNOSIS — M170 Bilateral primary osteoarthritis of knee: Secondary | ICD-10-CM | POA: Diagnosis not present

## 2019-08-12 DIAGNOSIS — G4733 Obstructive sleep apnea (adult) (pediatric): Secondary | ICD-10-CM | POA: Diagnosis not present

## 2019-08-12 LAB — CBC
HCT: 32.5 % — ABNORMAL LOW (ref 39.0–52.0)
Hemoglobin: 11.1 g/dL — ABNORMAL LOW (ref 13.0–17.0)
MCH: 31.8 pg (ref 26.0–34.0)
MCHC: 34.2 g/dL (ref 30.0–36.0)
MCV: 93.1 fL (ref 80.0–100.0)
Platelets: 164 10*3/uL (ref 150–400)
RBC: 3.49 MIL/uL — ABNORMAL LOW (ref 4.22–5.81)
RDW: 13.1 % (ref 11.5–15.5)
WBC: 11.6 10*3/uL — ABNORMAL HIGH (ref 4.0–10.5)
nRBC: 0 % (ref 0.0–0.2)

## 2019-08-12 LAB — BASIC METABOLIC PANEL
Anion gap: 6 (ref 5–15)
BUN: 23 mg/dL (ref 8–23)
CO2: 27 mmol/L (ref 22–32)
Calcium: 8.3 mg/dL — ABNORMAL LOW (ref 8.9–10.3)
Chloride: 104 mmol/L (ref 98–111)
Creatinine, Ser: 0.92 mg/dL (ref 0.61–1.24)
GFR calc Af Amer: >60 mL/min (ref >60)
GFR calc non Af Amer: >60 mL/min (ref >60)
Glucose, Bld: 129 mg/dL — ABNORMAL HIGH (ref 70–99)
Potassium: 3.6 mmol/L (ref 3.5–5.1)
Sodium: 137 mmol/L (ref 135–145)

## 2019-08-12 MED ORDER — TRAMADOL HCL 50 MG PO TABS: 50.0000 mg | ORAL_TABLET | Freq: Four times a day (QID) | ORAL | 0 refills | Status: DC | PRN

## 2019-08-12 MED ORDER — ACETAMINOPHEN 500 MG PO TABS
1000.0000 mg | ORAL_TABLET | Freq: Three times a day (TID) | ORAL | Status: DC
  Administered 2019-08-12 – 2019-08-13 (×3): 1000 mg via ORAL
  Filled 2019-08-12 (×3): qty 2

## 2019-08-12 MED ORDER — TRAMADOL HCL 50 MG PO TABS
50.0000 mg | ORAL_TABLET | Freq: Four times a day (QID) | ORAL | Status: DC | PRN
  Administered 2019-08-12 – 2019-08-13 (×3): 100 mg via ORAL
  Filled 2019-08-12 (×3): qty 2

## 2019-08-12 NOTE — Progress Notes (Addendum)
Danae Orleans, PA paged regarding the pt's concern of being d/c home today. Pt c/o dizziness and feeling weak. Vitals WNL. Will CTM.  Danae Orleans, PA returned page and advised for the pt to stay another night.

## 2019-08-12 NOTE — Progress Notes (Signed)
Physical Therapy Treatment Patient Details Name: Brandon Marks MRN: [id number] DOB: Sep 02, 1955 Today's Date: 08/12/2019    History of Present Illness Patient is 64 y.o. male s/p Lt TKA on 08/11/19 with PMH significant for perirectal abscess, HTN, gastic bypass surgery.    PT Comments    Pt with marked improvement in activity tolerance this pm - no c/o dizziness with attempts to mobilize - pt with initial drop in BP with move to standing but recovered and able to proceed with session.   Follow Up Recommendations  Follow surgeon's recommendation for DC plan and follow-up therapies     Equipment Recommendations  Rolling walker with 5" wheels    Recommendations for Other Services       Precautions / Restrictions Precautions Precautions: Fall Restrictions Weight Bearing Restrictions: No    Mobility  Bed Mobility Overal bed mobility: Needs Assistance Bed Mobility: Sit to Supine       Sit to supine: Supervision   General bed mobility comments: no assist for supine<>sit transfers  Transfers Overall transfer level: Needs assistance Equipment used: Rolling walker (2 wheeled) Transfers: Sit to/from Stand Sit to Stand: Min guard;Supervision         General transfer comment: cues for LE management and use of UEs to self assist  Ambulation/Gait Ambulation/Gait assistance: Min guard Gait Distance (Feet): 140 Feet Assistive device: Rolling walker (2 wheeled) Gait Pattern/deviations: Step-to pattern;Decreased stride length;Decreased stance time - left;Decreased step length - right;Shuffle;Trunk flexed Gait velocity: decreased   General Gait Details: cues for sequence, posture and position from RW;    Chief Strategy Officer    Modified Rankin (Stroke Patients Only)       Balance Overall balance assessment: Needs assistance Sitting-balance support: Feet supported Sitting balance-Leahy Scale: Good     Standing balance support: During  functional activity;Bilateral upper extremity supported Standing balance-Leahy Scale: Fair                              Cognition Arousal/Alertness: Awake/alert Behavior During Therapy: WFL for tasks assessed/performed Overall Cognitive Status: Within Functional Limits for tasks assessed                                 General Comments: pt is alert and oriented x4, somewhat diffiuclt as he appears slightly impulsive and was not receptive to instructions for tasks during session.      Exercises Total Joint Exercises Ankle Circles/Pumps: AROM;Supine;Both;15 reps    General Comments        Pertinent Vitals/Pain Pain Assessment: 0-10 Pain Score: 5  Pain Location: Lt knee Pain Descriptors / Indicators: Sore;Aching Pain Intervention(s): Limited activity within patient's tolerance;Monitored during session;Premedicated before session;Ice applied    Home Living                      Prior Function            PT Goals (current goals can now be found in the care plan section) Acute Rehab PT Goals Patient Stated Goal: regain IND PT Goal Formulation: With patient Time For Goal Achievement: 08/18/19 Potential to Achieve Goals: Good Progress towards PT goals: Progressing toward goals    Frequency    7X/week      PT Plan Current plan remains appropriate    Co-evaluation  AM-PAC PT "6 Clicks" Mobility   Outcome Measure  Help needed turning from your back to your side while in a flat bed without using bedrails?: None Help needed moving from lying on your back to sitting on the side of a flat bed without using bedrails?: None Help needed moving to and from a bed to a chair (including a wheelchair)?: A Little Help needed standing up from a chair using your arms (e.g., wheelchair or bedside chair)?: A Little Help needed to walk in hospital room?: A Little Help needed climbing 3-5 steps with a railing? : A Little 6 Click  Score: 20    End of Session Equipment Utilized During Treatment: Gait belt Activity Tolerance: Patient tolerated treatment well Patient left: in bed;with call bell/phone within reach;with bed alarm set Nurse Communication: Mobility status PT Visit Diagnosis: Muscle weakness (generalized) (M62.81);Difficulty in walking, not elsewhere classified (R26.2)     Time: [id number] PT Time Calculation (min) (ACUTE ONLY): 23 min  Charges:  $Gait Training: 23-37 mins                     [place] Pager [phone] Office [phone]    Manisha Cancel 08/12/2019, 6:10 PM

## 2019-08-12 NOTE — Progress Notes (Signed)
     Subjective: 1 Day Post-Op Procedure(s) (LRB): TOTAL KNEE ARTHROPLASTY (Left)   Patient reports pain as mild, but analgesic meds are causing significant nausea.  Discussed changing analgesic medication to tramadol from [place] on d/c home today after PT.   Patient's anticipated LOS is less than 2 midnights, meeting these requirements: - Lives within 1 hour of care - Has a competent adult at home to recover with post-op recover - NO history of  - Chronic pain requiring opiods  - Diabetes  - Coronary Artery Disease  - Heart failure  - Heart attack  - Stroke  - DVT/VTE  - Cardiac arrhythmia  - Respiratory Failure/COPD  - Renal failure  - Anemia  - Advanced Liver disease    Objective:   VITALS:   Vitals:   08/12/19 0853 08/12/19 0938  BP: 102/79 122/69  Pulse: (!) 58 67  Resp:  16  Temp:  99.5 F (37.5 C)  SpO2:  100%    Dorsiflexion/Plantar flexion intact Incision: dressing C/D/I No cellulitis present Compartment soft  LABS Recent Labs    08/12/19 0358  HGB 11.1*  HCT 32.5*  WBC 11.6*  PLT 164    Recent Labs    08/12/19 0358  NA 137  K 3.6  BUN 23  CREATININE 0.92  GLUCOSE 129*     Assessment/Plan: 1 Day Post-Op Procedure(s) (LRB): TOTAL KNEE ARTHROPLASTY (Left) Advance diet Up with therapy D/C IV fluids Discharge home Follow up in 2 weeks at [facility] Follow up with OLIN,Jerriyah Louis D in 2 weeks.  Contact information:  [facility] [address] [phone]    Obese (BMI 30-39.9) Estimated body mass index is 30.43 kg/m as calculated from the following:   Height as of this encounter: 6\' 2"  (1.88 m).   Weight as of this encounter: 107.5 kg. Patient also counseled that weight may inhibit the healing process Patient counseled that losing weight will help with future health issues     [place] Pugh. Bookert Guzzi   PAC  08/12/2019, 10:00 AM

## 2019-08-12 NOTE — Progress Notes (Signed)
Physical Therapy Treatment Patient Details Name: Brandon Marks MRN: [id number] DOB: 1956/07/05 Today's Date: 08/12/2019    History of Present Illness Patient is 64 y.o. male s/p Lt TKA on 08/11/19 with PMH significant for perirectal abscess, HTN, gastic bypass surgery.    PT Comments    Pt cooperative but continues limited by dizziness with attempts at OOB activity.  Orthostatic BP recorded by nursing.   Follow Up Recommendations  Follow surgeon's recommendation for DC plan and follow-up therapies     Equipment Recommendations  Rolling walker with 5" wheels    Recommendations for Other Services       Precautions / Restrictions Precautions Precautions: Fall Restrictions Weight Bearing Restrictions: No    Mobility  Bed Mobility Overal bed mobility: Needs Assistance Bed Mobility: Supine to Sit     Supine to sit: HOB elevated;Supervision     General bed mobility comments: no assist for supine<>sit transfers  Transfers Overall transfer level: Needs assistance Equipment used: Rolling walker (2 wheeled) Transfers: Sit to/from Stand Sit to Stand: Min guard         General transfer comment: pt required direct cues for safe hand placement and steady assist to rise.  Ambulation/Gait Ambulation/Gait assistance: Min assist Gait Distance (Feet): 5 Feet Assistive device: Rolling walker (2 wheeled) Gait Pattern/deviations: Step-to pattern;Decreased stride length;Decreased stance time - left;Decreased step length - right;Shuffle;Trunk flexed Gait velocity: decreased   General Gait Details: cues for sequence, posture and position from RW; distance ltd by increasing dizziness   Stairs             Wheelchair Mobility    Modified Rankin (Stroke Patients Only)       Balance Overall balance assessment: Needs assistance Sitting-balance support: Feet supported Sitting balance-Leahy Scale: Good     Standing balance support: During functional activity;Bilateral  upper extremity supported Standing balance-Leahy Scale: Fair                              Cognition Arousal/Alertness: Awake/alert Behavior During Therapy: WFL for tasks assessed/performed Overall Cognitive Status: Within Functional Limits for tasks assessed                                 General Comments: pt is alert and oriented x4, somewhat diffiuclt as he appears slightly impulsive and was not receptive to instructions for tasks during session.      Exercises Total Joint Exercises Ankle Circles/Pumps: AROM;Supine;Both;15 reps Quad Sets: AROM;10 reps;Supine;Left Heel Slides: AAROM;Supine;Left;15 reps Straight Leg Raises: AAROM;AROM;Left;15 reps;Supine Goniometric ROM: AAROM L knee -5 - 100    General Comments        Pertinent Vitals/Pain Pain Assessment: 0-10 Pain Score: 5  Pain Location: Lt knee Pain Descriptors / Indicators: Sore;Aching Pain Intervention(s): Limited activity within patient's tolerance;Monitored during session;Premedicated before session;Ice applied    Home Living                      Prior Function            PT Goals (current goals can now be found in the care plan section) Acute Rehab PT Goals Patient Stated Goal: regain IND PT Goal Formulation: With patient Time For Goal Achievement: 08/18/19 Potential to Achieve Goals: Good Progress towards PT goals: Progressing toward goals    Frequency    7X/week      PT Plan  Current plan remains appropriate    Co-evaluation              AM-PAC PT "6 Clicks" Mobility   Outcome Measure  Help needed turning from your back to your side while in a flat bed without using bedrails?: None Help needed moving from lying on your back to sitting on the side of a flat bed without using bedrails?: None Help needed moving to and from a bed to a chair (including a wheelchair)?: A Little Help needed standing up from a chair using your arms (e.g., wheelchair or  bedside chair)?: A Little Help needed to walk in hospital room?: A Little Help needed climbing 3-5 steps with a railing? : A Little 6 Click Score: 20    End of Session Equipment Utilized During Treatment: Gait belt Activity Tolerance: Patient limited by fatigue;Other (comment)(orthostatic) Patient left: in chair;with call bell/phone within reach;with chair alarm set Nurse Communication: Mobility status PT Visit Diagnosis: Muscle weakness (generalized) (M62.81);Difficulty in walking, not elsewhere classified (R26.2)     Time: [id number] PT Time Calculation (min) (ACUTE ONLY): 37 min  Charges:  $Gait Training: 8-22 mins $Therapeutic Exercise: 8-22 mins                     [place] Pager [phone] Office [phone]    Gayle Collard 08/12/2019, 1:32 PM

## 2019-08-13 DIAGNOSIS — M170 Bilateral primary osteoarthritis of knee: Secondary | ICD-10-CM | POA: Diagnosis not present

## 2019-08-13 DIAGNOSIS — Z9884 Bariatric surgery status: Secondary | ICD-10-CM | POA: Diagnosis not present

## 2019-08-13 DIAGNOSIS — E7800 Pure hypercholesterolemia, unspecified: Secondary | ICD-10-CM | POA: Diagnosis not present

## 2019-08-13 DIAGNOSIS — I10 Essential (primary) hypertension: Secondary | ICD-10-CM | POA: Diagnosis not present

## 2019-08-13 DIAGNOSIS — Z79899 Other long term (current) drug therapy: Secondary | ICD-10-CM | POA: Diagnosis not present

## 2019-08-13 DIAGNOSIS — E785 Hyperlipidemia, unspecified: Secondary | ICD-10-CM | POA: Diagnosis not present

## 2019-08-13 DIAGNOSIS — Z6830 Body mass index (BMI) 30.0-30.9, adult: Secondary | ICD-10-CM | POA: Diagnosis not present

## 2019-08-13 DIAGNOSIS — G4733 Obstructive sleep apnea (adult) (pediatric): Secondary | ICD-10-CM | POA: Diagnosis not present

## 2019-08-13 LAB — CBC
HCT: 31.1 % — ABNORMAL LOW (ref 39.0–52.0)
Hemoglobin: 10.2 g/dL — ABNORMAL LOW (ref 13.0–17.0)
MCH: 31 pg (ref 26.0–34.0)
MCHC: 32.8 g/dL (ref 30.0–36.0)
MCV: 94.5 fL (ref 80.0–100.0)
Platelets: 153 10*3/uL (ref 150–400)
RBC: 3.29 MIL/uL — ABNORMAL LOW (ref 4.22–5.81)
RDW: 13.2 % (ref 11.5–15.5)
WBC: 11.5 10*3/uL — ABNORMAL HIGH (ref 4.0–10.5)
nRBC: 0 % (ref 0.0–0.2)

## 2019-08-13 LAB — BASIC METABOLIC PANEL
Anion gap: 8 (ref 5–15)
BUN: 22 mg/dL (ref 8–23)
CO2: 25 mmol/L (ref 22–32)
Calcium: 8.6 mg/dL — ABNORMAL LOW (ref 8.9–10.3)
Chloride: 106 mmol/L (ref 98–111)
Creatinine, Ser: 0.82 mg/dL (ref 0.61–1.24)
GFR calc Af Amer: >60 mL/min (ref >60)
GFR calc non Af Amer: >60 mL/min (ref >60)
Glucose, Bld: 75 mg/dL (ref 70–99)
Potassium: 3.8 mmol/L (ref 3.5–5.1)
Sodium: 139 mmol/L (ref 135–145)

## 2019-08-13 NOTE — Progress Notes (Signed)
Reviewed discharge instructions with patient, medications, aspirin use for VTE, reviewed next time to take pain medications and new medications. I instructed patient to not shower until POD 3, he stated he would take a shower today when he arrived home and just wrap the knee. I instructed and educated patient to not shower until POD #3 again due to the risk of infection. Patient verbalizes understanding.

## 2019-08-13 NOTE — TOC Initial Note (Signed)
Transition of Care [facility]) - Initial/Assessment Note    Patient Details  Name: Brandon Marks MRN: [id number] Date of Birth: 25-Jun-1956  Transition of Care [facility]) CM/SW Contact:    Joaquin Courts, RN Phone Number: 08/13/2019, 9:32 AM  Clinical Narrative:                 CM spoke with patient. patiuent plans to go to outpatient PT and reports has received his walker and declines 3-in-1.   Expected Discharge Plan: Home/Self Care Barriers to Discharge: No Barriers Identified   Patient Goals and CMS Choice Patient states their goals for this hospitalization and ongoing recovery are:: to go home      Expected Discharge Plan and Services Expected Discharge Plan: Home/Self Care   Discharge Planning Services: CM Consult   Living arrangements for the past 2 months: Single Family Home Expected Discharge Date: 08/13/19               DME Arranged: N/A DME Agency: NA       HH Arranged: NA [place] Agency: NA        Prior Living Arrangements/Services Living arrangements for the past 2 months: Single Family Home   Patient language and need for interpreter reviewed:: Yes Do you feel safe going back to the place where you live?: Yes      Need for Family Participation in Patient Care: Yes (Comment) Care giver support system in place?: Yes (comment)   Criminal Activity/Legal Involvement Pertinent to Current Situation/Hospitalization: No - Comment as needed  Activities of Daily Living Home Assistive Devices/Equipment: Cane (specify quad or straight), Wheelchair(walker in room) ADL Screening (condition at time of admission) Patient's cognitive ability adequate to safely complete daily activities?: Yes Is the patient deaf or have difficulty hearing?: No Does the patient have difficulty seeing, even when wearing glasses/contacts?: No Does the patient have difficulty concentrating, remembering, or making decisions?: No Patient able to express need for assistance with ADLs?: Yes Does  the patient have difficulty dressing or bathing?: No Independently performs ADLs?: Yes (appropriate for developmental age) Does the patient have difficulty walking or climbing stairs?: No Weakness of Legs: None Weakness of Arms/Hands: Both  Permission Sought/Granted                  Emotional Assessment Appearance:: Appears stated age Attitude/Demeanor/Rapport: Engaged Affect (typically observed): Accepting Orientation: : Oriented to Self, Oriented to Place, Oriented to  Time, Oriented to Situation   Psych Involvement: No (comment)  Admission diagnosis:  Status post total left knee replacement [Z96.652] S/P total knee replacement [Z96.659] Patient Active Problem List   Diagnosis Date Noted  . S/P left TKA 08/11/2019  . S/P total knee replacement 08/11/2019  . Genu varum of both lower extremities 06/13/2019  . Vitamin B12 deficiency 06/13/2019  . Obesity (BMI 30-39.9) 12/27/2018  . 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 Tinnitus of right ear 12/27/2018  . Gait disturbance 12/27/2018  . Normochromic anemia 12/27/2018  . Primary osteoarthritis of both knees 12/27/2018  . Diastolic heart failure ([place]) 09/11/2017  . DDD (degenerative disc disease), cervical 09/04/2017  . Dyspnea, chronic DOE 02/27/2012  . Herniated disc   . Hyperlipidemia 02/23/2009  . 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 Essential hypertension 10/18/2007  . OSA on CPAP 10/11/2007   PCP:  Ladell Pier, MD Pharmacy:   [facility], [address] [address] Phone: [phone] Fax:  [facility], [place] [address] Phone: [phone] Fax: [phone]     Social Determinants of Health (SDOH) Interventions    Readmission Risk Interventions No flowsheet data found.

## 2019-08-13 NOTE — Progress Notes (Signed)
Physical Therapy Treatment Patient Details Name: Brandon Marks MRN: [id number] DOB: 11-28-1955 Today's Date: 08/13/2019    History of Present Illness Patient is 64 y.o. male s/p Lt TKA on 08/11/19 with PMH significant for perirectal abscess, HTN, gastic bypass surgery.    PT Comments    Pt in good spirits and progressing well with mobility.  Pt ambulated increased distance in hall, negotiated stairs and performed HEP - written instruction provided and reviewed.   Follow Up Recommendations  Follow surgeon's recommendation for DC plan and follow-up therapies     Equipment Recommendations  Rolling walker with 5" wheels    Recommendations for Other Services       Precautions / Restrictions Precautions Precautions: Fall Restrictions Weight Bearing Restrictions: No    Mobility  Bed Mobility Overal bed mobility: Modified Independent                Transfers Overall transfer level: Needs assistance Equipment used: Rolling walker (2 wheeled) Transfers: Sit to/from Stand Sit to Stand: Supervision         General transfer comment: cues for LE management and use of UEs to self assist  Ambulation/Gait Ambulation/Gait assistance: Min guard;Supervision Gait Distance (Feet): 220 Feet Assistive device: Rolling walker (2 wheeled) Gait Pattern/deviations: Step-to pattern;Decreased stride length;Decreased stance time - left;Decreased step length - right;Shuffle;Trunk flexed Gait velocity: decreased   General Gait Details: cues for sequence, posture and position from RW;    Stairs Stairs: Yes Stairs assistance: Min assist Stair Management: One rail Left;Step to pattern;Forwards;With cane Number of Stairs: 5 General stair comments: cues for sequence and foot/cane placement; written instruction provided   Wheelchair Mobility    Modified Rankin (Stroke Patients Only)       Balance Overall balance assessment: Needs assistance Sitting-balance support: Feet  supported Sitting balance-Leahy Scale: Good       Standing balance-Leahy Scale: Fair                              Cognition Arousal/Alertness: Awake/alert Behavior During Therapy: WFL for tasks assessed/performed Overall Cognitive Status: Within Functional Limits for tasks assessed                                 General Comments: pt is alert and oriented x4, somewhat diffiuclt as he appears slightly impulsive and was not receptive to instructions for tasks during session.      Exercises Total Joint Exercises Ankle Circles/Pumps: AROM;Supine;Both;15 reps Quad Sets: AROM;10 reps;Supine;Left Heel Slides: AAROM;Supine;Left;20 reps Straight Leg Raises: AAROM;AROM;Left;15 reps;Supine Long Arc Quad: AROM;Left;10 reps;Seated Knee Flexion: AROM;AAROM;Left;10 reps;Seated Goniometric ROM: AAROM L knee -5 - 80    General Comments        Pertinent Vitals/Pain Pain Assessment: 0-10 Pain Score: 4  Pain Location: Lt knee Pain Descriptors / Indicators: Sore;Aching Pain Intervention(s): Limited activity within patient's tolerance;Monitored during session;Premedicated before session;Ice applied    Home Living                      Prior Function            PT Goals (current goals can now be found in the care plan section) Acute Rehab PT Goals Patient Stated Goal: regain IND PT Goal Formulation: With patient Time For Goal Achievement: 08/18/19 Potential to Achieve Goals: Good Progress towards PT goals: Progressing toward goals    Frequency  7X/week      PT Plan Current plan remains appropriate    Co-evaluation              AM-PAC PT "6 Clicks" Mobility   Outcome Measure  Help needed turning from your back to your side while in a flat bed without using bedrails?: None Help needed moving from lying on your back to sitting on the side of a flat bed without using bedrails?: None Help needed moving to and from a bed to a chair  (including a wheelchair)?: A Little Help needed standing up from a chair using your arms (e.g., wheelchair or bedside chair)?: A Little Help needed to walk in hospital room?: A Little Help needed climbing 3-5 steps with a railing? : A Little 6 Click Score: 20    End of Session Equipment Utilized During Treatment: Gait belt Activity Tolerance: Patient tolerated treatment well Patient left: in chair;with call bell/phone within reach;with chair alarm set Nurse Communication: Mobility status PT Visit Diagnosis: Muscle weakness (generalized) (M62.81);Difficulty in walking, not elsewhere classified (R26.2)     Time: [id number] PT Time Calculation (min) (ACUTE ONLY): 43 min  Charges:  $Gait Training: 8-22 mins $Therapeutic Exercise: 23-37 mins                     Debe Coder PT Acute Rehabilitation Services Pager [phone] Office [phone]    Shatiqua Heroux 08/13/2019, 12:06 PM

## 2019-08-13 NOTE — Progress Notes (Signed)
Subjective: 2 Days Post-Op Procedure(s) (LRB): TOTAL KNEE ARTHROPLASTY (Left) Patient reports pain as mild.    Objective: Vital signs in last 24 hours: Temp:  [97.8 F (36.6 C)-99.5 F (37.5 C)] 97.8 F (36.6 C) (01/16 0433) Pulse Rate:  [53-97] 67 (01/16 0433) Resp:  [14-16] 14 (01/16 0433) BP: (96-132)/(53-84) 116/68 (01/16 0433) SpO2:  [98 %-100 %] 100 % (01/16 0433)  Intake/Output from previous day: 01/15 0701 - 01/16 0700 In: 2987.8 [P.O.:1320; I.V.:1667.8] Out: 2900 [Urine:2900] Intake/Output this shift: Total I/O In: 240 [P.O.:240] Out: 250 [Urine:250]  Recent Labs    08/12/19 0358 08/13/19 0459  HGB 11.1* 10.2*   Recent Labs    08/12/19 0358 08/13/19 0459  WBC 11.6* 11.5*  RBC 3.49* 3.29*  HCT 32.5* 31.1*  PLT 164 153   Recent Labs    08/12/19 0358 08/13/19 0459  NA 137 139  K 3.6 3.8  CL 104 106  CO2 27 25  BUN 23 22  CREATININE 0.92 0.82  GLUCOSE 129* 75  CALCIUM 8.3* 8.6*   No results for input(s): LABPT, INR in the last 72 hours.  Neurovascular intact No cellulitis present Compartment soft   Assessment/Plan: 2 Days Post-Op Procedure(s) (LRB): TOTAL KNEE ARTHROPLASTY (Left) Discharge home after PT    Gaynelle Arabian 08/13/2019, 8:32 AM

## 2019-08-15 DIAGNOSIS — M25562 Pain in left knee: Secondary | ICD-10-CM | POA: Diagnosis not present

## 2019-08-19 NOTE — Telephone Encounter (Signed)
Went on the [employer] to start prior auth for Cpap Titration    [id number] Sleep study ICD- G47.33 OSA   AUTHORIZATION NUMBER [id number]  Will call Monday to schedule

## 2019-08-19 NOTE — Discharge Summary (Signed)
Physician Discharge Summary  Patient ID: Brandon Marks MRN: [id number] DOB/AGE: Dec 17, 1955 64 y.o.  Admit date: 08/11/2019 Discharge date: 08/13/2019   Procedures:  Procedure(s) (LRB): TOTAL KNEE ARTHROPLASTY (Left)  Attending Physician:  Dr. Paralee Cancel   Admission Diagnoses:   Left knee primary OA / pain  Discharge Diagnoses:  Principal Problem:   S/P total knee replacement Active Problems:   Obesity (BMI 30-39.9)   S/P left TKA  Past Medical History:  Diagnosis Date  . Arthritis   . Herniated disc   . High cholesterol   . Hypertension   . Morbid obesity ([place])   . PAC (premature atrial contraction)   . Sleep apnea    non compliant with c-pap    HPI:    Brandon Marks, 64 y.o. male, has a history of pain and functional disability in the left knee due to arthritis and has failed non-surgical conservative treatments for greater than 12 weeks to includecorticosteriod injections, viscosupplementation injections and activity modification.  Onset of symptoms was gradual, starting 2 years ago with gradually worsening course since that time. The patient noted prior procedures on the knee to include  arthroscopy and menisectomy on the left knee(s).  Patient currently rates pain in the left knee(s) at 8 out of 10 with activity. Patient has worsening of pain with activity and weight bearing and pain that interferes with activities of daily living.  Patient has evidence of advanced bilateral knee osteoarthritis with complete loss of joint space and significant periarticular osteophytes within the medial patellofemoral compartments by imaging studies.There is no active infection.  PCP: Ladell Pier, MD   Discharged Condition: good  Hospital Course:  Patient underwent the above stated procedure on 08/11/2019. Patient tolerated the procedure well and brought to the recovery room in good condition and subsequently to the floor.  POD #1 BP: 122/69 ; Pulse: 67 ; Temp: 99.5 F  (37.5 C) ; Resp: 16 Patient reports pain as mild, but analgesic meds are causing significant nausea.  Discussed changing analgesic medication to tramadol from Norco.  Dorsiflexion/plantar flexion intact, incision: dressing C/D/I, no cellulitis present and compartment soft.   LABS  Basename    HGB     11.1  HCT     32.5   POD #2  BP: 116/68 ; Pulse: 67 ; Temp: 97.8 F (36.6 C) ; Resp: 14 Patient reports pain as mild. Ready to be discharged home.  Neurovascular intact.  No cellulitis present.  Compartment soft.  LABS  Basename    HGB     10.2  HCT     31.1    Discharge Exam: General appearance: alert, cooperative and no distress Extremities: Homans sign is negative, no sign of DVT, no edema, redness or tenderness in the calves or thighs and no ulcers, gangrene or trophic changes  Disposition: Home with follow up in 2 weeks   Follow-up Information    Paralee Cancel, MD. Schedule an appointment as soon as possible for a visit in 2 weeks.   Specialty: Orthopedic Surgery Contact information: [address] [place] [address] [id number]           Discharge Instructions    Call MD / Call 911   Complete by: As directed    If you experience chest pain or shortness of breath, CALL 911 and be transported to the hospital emergency room.  If you develope a fever above 101 F, pus (white drainage) or increased drainage or redness at the  wound, or calf pain, call your surgeon's office.   Change dressing   Complete by: As directed    Maintain surgical dressing until follow up in the clinic. If the edges start to pull up, may reinforce with tape. If the dressing is no longer working, may remove and cover with gauze and tape, but must keep the area dry and clean.  Call with any questions or concerns.   Constipation Prevention   Complete by: As directed    Drink plenty of fluids.  Prune juice may be helpful.  You may use a stool softener, such as Colace (over the  counter) 100 mg twice a day.  Use MiraLax (over the counter) for constipation as needed.   Diet - low sodium heart healthy   Complete by: As directed    Discharge instructions   Complete by: As directed    Maintain surgical dressing until follow up in the clinic. If the edges start to pull up, may reinforce with tape. If the dressing is no longer working, may remove and cover with gauze and tape, but must keep the area dry and clean.  Follow up in 2 weeks at [facility]. Call with any questions or concerns.   Increase activity slowly as tolerated   Complete by: As directed    Weight bearing as tolerated with assist device (walker, cane, etc) as directed, use it as long as suggested by your surgeon or therapist, typically at least 4-6 weeks.   TED hose   Complete by: As directed    Use stockings (TED hose) for 2 weeks on both leg(s).  You may remove them at night for sleeping.      Allergies as of 08/13/2019      Reactions   Codeine Nausea And Vomiting   Oxycodone Other (See Comments)   Upset GI      Medication List    TAKE these medications   aspirin 81 MG chewable tablet Commonly known as: Aspirin Childrens Chew 1 tablet (81 mg total) by mouth 2 (two) times daily. Take for 4 weeks, then resume regular dose.   docusate sodium 100 MG capsule Commonly known as: Colace Take 1 capsule (100 mg total) by mouth 2 (two) times daily.   ferrous sulfate 325 (65 FE) MG tablet Commonly known as: FerrouSul Take 1 tablet (325 mg total) by mouth 3 (three) times daily with meals for 14 days.   lisinopril-hydrochlorothiazide 20-25 MG tablet Commonly known as: ZESTORETIC Take 1 tablet by mouth daily.   lovastatin 40 MG tablet Commonly known as: MEVACOR Take 0.5 tablets (20 mg total) by mouth at bedtime. What changed: when to take this   methocarbamol 500 MG tablet Commonly known as: Robaxin Take 1 tablet (500 mg total) by mouth every 6 (six) hours as needed for muscle spasms.    ondansetron 4 MG tablet Commonly known as: Zofran Take 1 tablet (4 mg total) by mouth every 8 (eight) hours as needed for nausea or vomiting.   polyethylene glycol 17 g packet Commonly known as: MIRALAX / GLYCOLAX Take 17 g by mouth 2 (two) times daily.   traMADol 50 MG tablet Commonly known as: Ultram Take 1-2 tablets (50-100 mg total) by mouth every 6 (six) hours as needed.            Discharge Care Instructions  (From admission, onward)         Start     Ordered   08/12/19 0000  Change dressing  Comments: Maintain surgical dressing until follow up in the clinic. If the edges start to pull up, may reinforce with tape. If the dressing is no longer working, may remove and cover with gauze and tape, but must keep the area dry and clean.  Call with any questions or concerns.   08/12/19 1005           Signed: [place] Pugh. Munachimso Palin   PA-C  08/19/2019, 11:11 AM

## 2019-08-24 DIAGNOSIS — M25562 Pain in left knee: Secondary | ICD-10-CM | POA: Diagnosis not present

## 2019-08-26 DIAGNOSIS — M25562 Pain in left knee: Secondary | ICD-10-CM | POA: Diagnosis not present

## 2019-08-29 DIAGNOSIS — M25562 Pain in left knee: Secondary | ICD-10-CM | POA: Diagnosis not present

## 2019-09-02 DIAGNOSIS — M25562 Pain in left knee: Secondary | ICD-10-CM | POA: Diagnosis not present

## 2019-09-05 NOTE — Patient Instructions (Addendum)
DUE TO COVID-19 ONLY ONE VISITOR IS ALLOWED TO COME WITH YOU AND STAY IN THE WAITING ROOM ONLY DURING PRE OP AND PROCEDURE DAY OF SURGERY. THE 1 VISITOR MAY VISIT WITH YOU AFTER SURGERY IN YOUR PRIVATE ROOM DURING VISITING HOURS ONLY!  YOU NEED TO HAVE A COVID 19 TEST ON  2/12/21_______ @__9 :10 AM_____, THIS TEST MUST BE DONE BEFORE SURGERY, COME  [place], [place] , [id number].  ([place]) ONCE YOUR COVID TEST IS COMPLETED, PLEASE BEGIN THE QUARANTINE INSTRUCTIONS AS OUTLINED IN YOUR HANDOUT.                Brandon Marks    Your procedure is scheduled on: 09/13/19   Report to [facility] Main  Entrance   Report to admitting at  6:10 AM     Call this number if you have problems the morning of surgery [place], NO CHEWING GUM [place].   Do not eat food After Midnight.   YOU MAY HAVE CLEAR LIQUIDS FROM MIDNIGHT UNTIL 5:30 AM.   At 5:30 AM Please finish the prescribed Pre-Surgery  drink  . Nothing by mouth after you finish the  drink !   Take these medicines the morning of surgery with A SIP OF WATER:   No meds.   Bring your mask and tubing to the hospital with you.                                 You may not have any metal on your body including              piercings  Do not wear jewelry,  lotions, powders or  deodorant                       Men may shave face and neck.   Do not bring valuables to the hospital. [place].  Contacts, dentures or bridgework may not be worn into surgery.      Patients discharged the day of surgery will not be allowed to drive home  . IF YOU ARE HAVING SURGERY AND GOING HOME THE SAME DAY, YOU MUST HAVE AN ADULT TO DRIVE YOU HOME AND BE WITH YOU FOR 24 HOURS.  YOU MAY GO HOME BY TAXI OR UBER OR ORTHERWISE, BUT AN ADULT MUST ACCOMPANY YOU HOME AND STAY WITH YOU FOR 24 HOURS.  Name and  phone number of your driver:  Special Instructions: N/A              Please read over the following fact sheets you were given: _____________________________________________________________________             [facility] - Preparing for Surgery  Before surgery, you can play an important role.   Because skin is not sterile, your skin needs to be as free of germs as possible.   You can reduce the number of germs on your skin by washing with CHG (chlorahexidine gluconate) soap before surgery.   CHG is an antiseptic cleaner which kills germs and bonds with the skin to continue killing germs even after washing. Please DO NOT use if you have an allergy to CHG or antibacterial soaps .  If your skin becomes reddened/irritated stop using the CHG and inform your nurse when you arrive at Short Stay. You may shave your face/neck.  Please follow these instructions carefully:  1.  Shower with CHG Soap the night before surgery and the  morning of Surgery.  2.  If you choose to wash your hair, wash your hair first as usual with your  normal  shampoo.  3.  After you shampoo, rinse your hair and body thoroughly to remove the  shampoo.                                        4.  Use CHG as you would any other liquid soap.  You can apply chg directly  to the skin and wash                       Gently with a scrungie or clean washcloth.  5.  Apply the CHG Soap to your body ONLY FROM THE NECK DOWN.   Do not use on face/ open                           Wound or open sores. Avoid contact with eyes, ears mouth and genitals (private parts).                       Wash face,  Genitals (private parts) with your normal soap.             6.  Wash thoroughly, paying special attention to the area where your surgery  will be performed.  7.  Thoroughly rinse your body with warm water from the neck down.  8.  DO NOT shower/wash with your normal soap after using and rinsing off  the CHG Soap.            9.  Pat yourself  dry with a clean towel.            10.  Wear clean pajamas.            11.  Place clean sheets on your bed the night of your first shower and do not  sleep with pets. Day of Surgery : Do not apply any lotions/deodorants the morning of surgery.  Please wear clean clothes to the hospital/surgery center.  FAILURE TO FOLLOW THESE INSTRUCTIONS MAY RESULT IN THE CANCELLATION OF YOUR SURGERY PATIENT SIGNATURE_________________________________  NURSE SIGNATURE__________________________________  ________________________________________________________________________   Brandon Marks  An incentive spirometer is a tool that can help keep your lungs clear and active. This tool measures how well you are filling your lungs with each breath. Taking long deep breaths may help reverse or decrease the chance of developing breathing (pulmonary) problems (especially infection) following:  A long period of time when you are unable to move or be active. BEFORE THE PROCEDURE   If the spirometer includes an indicator to show your best effort, your nurse or respiratory therapist will set it to a desired goal.  If possible, sit up straight or lean slightly forward. Try not to slouch.  Hold the incentive spirometer in an upright position. INSTRUCTIONS FOR USE  1. Sit on the edge of your bed if possible, or sit up as far as you can in bed or on a chair. 2. Hold the incentive spirometer in an upright  position. 3. Breathe out normally. 4. Place the mouthpiece in your mouth and seal your lips tightly around it. 5. Breathe in slowly and as deeply as possible, raising the piston or the ball toward the top of the column. 6. Hold your breath for 3-5 seconds or for as long as possible. Allow the piston or ball to fall to the bottom of the column. 7. Remove the mouthpiece from your mouth and breathe out normally. 8. Rest for a few seconds and repeat Steps 1 through 7 at least 10 times every 1-2 hours when you are  awake. Take your time and take a few normal breaths between deep breaths. 9. The spirometer may include an indicator to show your best effort. Use the indicator as a goal to work toward during each repetition. 10. After each set of 10 deep breaths, practice coughing to be sure your lungs are clear. If you have an incision (the cut made at the time of surgery), support your incision when coughing by placing a pillow or rolled up towels firmly against it. Once you are able to get out of bed, walk around indoors and cough well. You may stop using the incentive spirometer when instructed by your caregiver.  RISKS AND COMPLICATIONS  Take your time so you do not get dizzy or light-headed.  If you are in pain, you may need to take or ask for pain medication before doing incentive spirometry. It is harder to take a deep breath if you are having pain. AFTER USE  Rest and breathe slowly and easily.  It can be helpful to keep track of a log of your progress. Your caregiver can provide you with a simple table to help with this. If you are using the spirometer at home, follow these instructions: [place] IF:   You are having difficultly using the spirometer.  You have trouble using the spirometer as often as instructed.  Your pain medication is not giving enough relief while using the spirometer.  You develop fever of 100.5 F (38.1 C) or higher. SEEK IMMEDIATE MEDICAL CARE IF:   You cough up bloody sputum that had not been present before.  You develop fever of 102 F (38.9 C) or greater.  You develop worsening pain at or near the incision site. MAKE SURE YOU:   Understand these instructions.  Will watch your condition.  Will get help right away if you are not doing well or get worse. Document Released: 11/24/2006 Document Revised: 10/06/2011 Document Reviewed: 01/25/2007 ExitCare Patient Information 2014 ExitCare,  [place].   ________________________________________________________________________  WHAT IS A BLOOD TRANSFUSION? Blood Transfusion Information  A transfusion is the replacement of blood or some of its parts. Blood is made up of multiple cells which provide different functions.  Red blood cells carry oxygen and are used for blood loss replacement.  White blood cells fight against infection.  Platelets control bleeding.  Plasma helps clot blood.  Other blood products are available for specialized needs, such as hemophilia or other clotting disorders. BEFORE THE TRANSFUSION  Who gives blood for transfusions?   Healthy volunteers who are fully evaluated to make sure their blood is safe. This is blood bank blood. Transfusion therapy is the safest it has ever been in the practice of medicine. Before blood is taken from a donor, a complete history is taken to make sure that person has no history of diseases nor engages in risky social behavior (examples are intravenous drug use or sexual activity with multiple  partners). The donor's travel history is screened to minimize risk of transmitting infections, such as malaria. The donated blood is tested for signs of infectious diseases, such as HIV and hepatitis. The blood is then tested to be sure it is compatible with you in order to minimize the chance of a transfusion reaction. If you or a relative donates blood, this is often done in anticipation of surgery and is not appropriate for emergency situations. It takes many days to process the donated blood. RISKS AND COMPLICATIONS Although transfusion therapy is very safe and saves many lives, the main dangers of transfusion include:   Getting an infectious disease.  Developing a transfusion reaction. This is an allergic reaction to something in the blood you were given. Every precaution is taken to prevent this. The decision to have a blood transfusion has been considered carefully by your caregiver  before blood is given. Blood is not given unless the benefits outweigh the risks. AFTER THE TRANSFUSION  Right after receiving a blood transfusion, you will usually feel much better and more energetic. This is especially true if your red blood cells have gotten low (anemic). The transfusion raises the level of the red blood cells which carry oxygen, and this usually causes an energy increase.  The nurse administering the transfusion will monitor you carefully for complications. HOME CARE INSTRUCTIONS  No special instructions are needed after a transfusion. You may find your energy is better. Speak with your caregiver about any limitations on activity for underlying diseases you may have. SEEK MEDICAL CARE IF:   Your condition is not improving after your transfusion.  You develop redness or irritation at the intravenous (IV) site. SEEK IMMEDIATE MEDICAL CARE IF:  Any of the following symptoms occur over the next 12 hours:  Shaking chills.  You have a temperature by mouth above 102 F (38.9 C), not controlled by medicine.  Chest, back, or muscle pain.  People around you feel you are not acting correctly or are confused.  Shortness of breath or difficulty breathing.  Dizziness and fainting.  You get a rash or develop hives.  You have a decrease in urine output.  Your urine turns a dark color or changes to pink, red, or brown. Any of the following symptoms occur over the next 10 days:  You have a temperature by mouth above 102 F (38.9 C), not controlled by medicine.  Shortness of breath.  Weakness after normal activity.  The white part of the eye turns yellow (jaundice).  You have a decrease in the amount of urine or are urinating less often.  Your urine turns a dark color or changes to pink, red, or brown. Document Released: 07/11/2000 Document Revised: 10/06/2011 Document Reviewed: 02/28/2008 [facility] Patient Information 2014 [place],  [place].  _______________________________________________________________________

## 2019-09-07 ENCOUNTER — Encounter (HOSPITAL_COMMUNITY): Payer: Self-pay

## 2019-09-07 ENCOUNTER — Encounter (HOSPITAL_COMMUNITY)
Admission: RE | Admit: 2019-09-07 | Discharge: 2019-09-07 | Disposition: A | Discharge status: Home / Self Care | Payer: Medicare HMO | Source: Ambulatory Visit | Attending: Orthopedic Surgery | Admitting: Orthopedic Surgery

## 2019-09-07 ENCOUNTER — Other Ambulatory Visit: Payer: Self-pay

## 2019-09-07 DIAGNOSIS — M1711 Unilateral primary osteoarthritis, right knee: Secondary | ICD-10-CM | POA: Diagnosis not present

## 2019-09-07 DIAGNOSIS — M25562 Pain in left knee: Secondary | ICD-10-CM | POA: Diagnosis not present

## 2019-09-07 DIAGNOSIS — Z01812 Encounter for preprocedural laboratory examination: Secondary | ICD-10-CM | POA: Insufficient documentation

## 2019-09-07 LAB — BASIC METABOLIC PANEL
Anion gap: 5 (ref 5–15)
BUN: 25 mg/dL — ABNORMAL HIGH (ref 8–23)
CO2: 31 mmol/L (ref 22–32)
Calcium: 8.8 mg/dL — ABNORMAL LOW (ref 8.9–10.3)
Chloride: 104 mmol/L (ref 98–111)
Creatinine, Ser: 1.01 mg/dL (ref 0.61–1.24)
GFR calc Af Amer: >60 mL/min (ref >60)
GFR calc non Af Amer: >60 mL/min (ref >60)
Glucose, Bld: 100 mg/dL — ABNORMAL HIGH (ref 70–99)
Potassium: 3.8 mmol/L (ref 3.5–5.1)
Sodium: 140 mmol/L (ref 135–145)

## 2019-09-07 LAB — CBC
HCT: 37.1 % — ABNORMAL LOW (ref 39.0–52.0)
Hemoglobin: 12 g/dL — ABNORMAL LOW (ref 13.0–17.0)
MCH: 30.9 pg (ref 26.0–34.0)
MCHC: 32.3 g/dL (ref 30.0–36.0)
MCV: 95.6 fL (ref 80.0–100.0)
Platelets: 298 10*3/uL (ref 150–400)
RBC: 3.88 MIL/uL — ABNORMAL LOW (ref 4.22–5.81)
RDW: 13.5 % (ref 11.5–15.5)
WBC: 5.3 10*3/uL (ref 4.0–10.5)
nRBC: 0 % (ref 0.0–0.2)

## 2019-09-07 LAB — SURGICAL PCR SCREEN
MRSA, PCR: NEGATIVE
Staphylococcus aureus: NEGATIVE

## 2019-09-07 NOTE — Progress Notes (Signed)
PCP - Dr. Etheleen Mayhew Cardiologist -  Dr. Jerilynn Mages. [place]  Chest x-ray - no EKG - 03/14/19 Stress Test - 2018 ECHO - no Cardiac Cath - 2010  Sleep Study - yes CPAP - yes  Fasting Blood Sugar - NA Checks Blood Sugar _____ times a day  Blood Thinner Instructions:ASA Aspirin Instructions:none. Pt stopped on his own Last Dose:09/05/19  Anesthesia review:   Patient denies shortness of breath, fever, cough and chest pain at PAT appointment  yes Patient verbalized understanding of instructions that were given to them at the PAT appointment. Patient was also instructed that they will need to review over the PAT instructions again at home before surgery. yes

## 2019-09-09 ENCOUNTER — Other Ambulatory Visit (HOSPITAL_COMMUNITY)
Admission: RE | Admit: 2019-09-09 | Discharge: 2019-09-09 | Disposition: A | Discharge status: Home / Self Care | Payer: Medicare HMO | Source: Ambulatory Visit | Attending: Orthopedic Surgery | Admitting: Orthopedic Surgery

## 2019-09-09 DIAGNOSIS — Z20822 Contact with and (suspected) exposure to covid-19: Secondary | ICD-10-CM | POA: Insufficient documentation

## 2019-09-09 DIAGNOSIS — Z01812 Encounter for preprocedural laboratory examination: Secondary | ICD-10-CM | POA: Diagnosis not present

## 2019-09-09 DIAGNOSIS — M25562 Pain in left knee: Secondary | ICD-10-CM | POA: Diagnosis not present

## 2019-09-09 LAB — SARS CORONAVIRUS 2 (TAT 6-24 HRS): SARS Coronavirus 2: NEGATIVE

## 2019-09-12 NOTE — H&P (Signed)
TOTAL KNEE ADMISSION H&P  Patient is being admitted for right total knee arthroplasty.  Subjective:  Chief Complaint:right knee pain.  HPI: Brandon Marks, 64 y.o. male, has a history of pain and functional disability in the right knee due to arthritis and has failed non-surgical conservative treatments for greater than 12 weeks to includecorticosteriod injections, viscosupplementation injections and activity modification.  Onset of symptoms was gradual, starting 2 years ago with gradually worsening course since that time. The patient noted no past surgery on the right knee(s).  Patient currently rates pain in the right knee(s) at 7 out of 10 with activity. Patient has worsening of pain with activity and weight bearing and pain that interferes with activities of daily living.  Patient has evidence of joint space narrowing by imaging studies. There is no active infection.  Patient Active Problem List   Diagnosis Date Noted  . S/P left TKA 08/11/2019  . S/P total knee replacement 08/11/2019  . Genu varum of both lower extremities 06/13/2019  . Vitamin B12 deficiency 06/13/2019  . Obesity (BMI 30-39.9) 12/27/2018  . 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 Tinnitus of right ear 12/27/2018  . Gait disturbance 12/27/2018  . Normochromic anemia 12/27/2018  . Primary osteoarthritis of both knees 12/27/2018  . Diastolic heart failure ([place]) 09/11/2017  . DDD (degenerative disc disease), cervical 09/04/2017  . Dyspnea, chronic DOE 02/27/2012  . Herniated disc   . Hyperlipidemia 02/23/2009  . 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 Essential hypertension 10/18/2007  . OSA on CPAP 10/11/2007   Past Medical History:  Diagnosis Date  . Arthritis   . Herniated disc   . High cholesterol   . Hypertension   . Morbid obesity ([place])   . PAC (premature atrial contraction)   . Sleep apnea    non compliant with c-pap    Past Surgical History:  Procedure Laterality Date  .  CARDIAC CATHETERIZATION  02/20/2009   normal coronary arteries  . EYE SURGERY     bilateral cataract with lens implant  . INCISION AND DRAINAGE PERIRECTAL ABSCESS N/A 01/02/2018   Procedure: IRRIGATION AND DEBRIDEMENT PERIRECTAL ABSCESS;  Surgeon: Coralie Keens, MD;  Location: [facility];  Service: General;  Laterality: N/A;  . IRRIGATION AND DEBRIDEMENT BUTTOCKS N/A 01/05/2018   Procedure: DEBRIDEMENT BUTTOCKS ABSCESS;  Surgeon: Georganna Skeans, MD;  Location: [facility];  Service: General;  Laterality: N/A;  . KNEE ARTHROSCOPY  2010   Lt  . NM MYOCAR PERF WALL MOTION  09/28/2007   small area of reversibility in the anterolateral wall at the apex concerning for ischemia  . SHOULDER ARTHROSCOPY  4/12   Rt  . TOTAL KNEE ARTHROPLASTY Left 08/11/2019   Procedure: TOTAL KNEE ARTHROPLASTY;  Surgeon: Paralee Cancel, MD;  Location: [facility];  Service: Orthopedics;  Laterality: Left;  70 mins  . WOUND DEBRIDEMENT N/A 01/04/2018   Procedure: IRRIGATION AND DEBRIDEMENT OF BUTTOCKS AND REMOVAL OF TICK FROM RIGHT TESTICLE;  Surgeon: Georganna Skeans, MD;  Location: [facility];  Service: General;  Laterality: N/A;    No current facility-administered medications for this encounter.   Current Outpatient Medications  Medication Sig Dispense Refill Last Dose  . HYDROcodone-acetaminophen (NORCO/VICODIN) 5-325 MG tablet Take 1-2 tablets by mouth 4 (four) times daily as needed for pain.     Marland Kitchen levocetirizine (XYZAL) 5 MG tablet Take 5 mg by mouth daily.     Marland Kitchen lisinopril-hydrochlorothiazide (ZESTORETIC) 20-25 MG tablet Take 1 tablet by mouth 3 (three) times a week.      Marland Kitchen  lovastatin (MEVACOR) 40 MG tablet Take 0.5 tablets (20 mg total) by mouth at bedtime. (Patient taking differently: Take 20 mg by mouth 3 (three) times a week. ) 45 tablet 3   . methocarbamol (ROBAXIN) 500 MG tablet Take 1 tablet (500 mg total) by mouth every 6 (six) hours as needed for muscle spasms. 40 tablet 0   . ondansetron (ZOFRAN) 4 MG tablet Take 1 tablet (4  mg total) by mouth every 8 (eight) hours as needed for nausea or vomiting. 30 tablet 0   . docusate sodium (COLACE) 100 MG capsule Take 1 capsule (100 mg total) by mouth 2 (two) times daily. (Patient not taking: Reported on 08/31/2019) 28 capsule 0 Not Taking at Unknown time  . ferrous sulfate (FERROUSUL) 325 (65 FE) MG tablet Take 1 tablet (325 mg total) by mouth 3 (three) times daily with meals for 14 days. (Patient not taking: Reported on 08/31/2019) 42 tablet 0 Not Taking at Unknown time  . polyethylene glycol (MIRALAX / GLYCOLAX) 17 g packet Take 17 g by mouth 2 (two) times daily. (Patient not taking: Reported on 08/31/2019) 28 packet 0 Not Taking at Unknown time  . traMADol (ULTRAM) 50 MG tablet Take 1-2 tablets (50-100 mg total) by mouth every 6 (six) hours as needed. (Patient not taking: Reported on 08/31/2019) 40 tablet 0 Not Taking at Unknown time   Allergies  Allergen Reactions  . Codeine Nausea And Vomiting  . Oxycodone Other (See Comments)    Upset GI    Social History   Tobacco Use  . Smoking status: Never Smoker  . Smokeless tobacco: Never Used  Substance Use Topics  . Alcohol use: No    Family History  Problem Relation Age of Onset  . Hypertension Mother   . Heart failure Mother   . Alzheimer's disease Mother   . Skin cancer Mother   . Heart disease Father   . Alzheimer's disease Maternal Grandmother   . Alzheimer's disease Maternal Grandfather      Review of Systems  Constitutional: Negative for chills and fever.  Respiratory: Negative for cough and shortness of breath.   Gastrointestinal: Negative for nausea and vomiting.  Musculoskeletal: Positive for arthralgias.    Objective:  Physical Exam Well nourished and well developed. General: Alert and oriented x3, cooperative and pleasant, no acute distress. Head: normocephalic, atraumatic, neck supple. Eyes: EOMI Respiratory: breath sounds clear in all fields, no wheezing, rales, or rhonchi. Cardiovascular: Regular  rate and rhythm, no murmurs, gallops or rubs.  Abdomen: non-tender to palpation and soft, normoactive bowel sounds.  Musculoskeletal: Right knee exam: genu varum Slight flexion contracture  Flexes to 120 with tightness Global tenderness worse medial and anterior Otherwise neurovascular intact No evidence of lower extremity cellulitis  Calves soft and nontender. Motor function intact in LE. Strength 5/5 LE bilaterally. Neuro: Distal pulses 2+. Sensation to light touch intact in LE.  Vital signs in last 24 hours:    Labs:  Estimated body mass index is 29.54 kg/m as calculated from the following:   Height as of 09/07/19: 6\' 2"  (1.88 m).   Weight as of 09/07/19: 104.4 kg.   Imaging Review Plain radiographs demonstrate severe degenerative joint disease of the right knee(s). The overall alignment ismild varus. The bone quality appears to be adequate for age and reported activity level.  Assessment/Plan:  End stage arthritis, right knee   The patient history, physical examination, clinical judgment of the provider and imaging studies are consistent with end stage degenerative  joint disease of the right knee(s) and total knee arthroplasty is deemed medically necessary. The treatment options including medical management, injection therapy arthroscopy and arthroplasty were discussed at length. The risks and benefits of total knee arthroplasty were presented and reviewed. The risks due to aseptic loosening, infection, stiffness, patella tracking problems, thromboembolic complications and other imponderables were discussed. The patient acknowledged the explanation, agreed to proceed with the plan and consent was signed. Patient is being admitted for inpatient treatment for surgery, pain control, PT, OT, prophylactic antibiotics, VTE prophylaxis, progressive ambulation and ADL's and discharge planning. The patient is planning to be discharged home.   Therapy Plans: outpatient therapy at [facility] Disposition: Home with a friend Planned DVT Prophylaxis: aspirin 81mg  BID DME needed: walker PCP: Dr. Wynetta Emery Cardiologist: Dr. Sallyanne Kuster, clearance received  TXA: IV Allergies: NKDA Anesthesia Concerns: none BMI: 30.8 Not diabetic.   Other: May go home same day vs stay the night based on comfort level after surgery. No help at home.   [place] okay.  Patient's anticipated LOS is less than 2 midnights, meeting these requirements: - Younger than 64 - Lives within 1 hour of care - Has a competent adult at home to recover with post-op recover - NO history of  - Chronic pain requiring opiods  - Diabetes  - Coronary Artery Disease  - Heart failure  - Heart attack  - Stroke  - DVT/VTE  - Cardiac arrhythmia  - Respiratory Failure/COPD  - Renal failure  - Anemia  - Advanced Liver disease  - Patient was instructed on what medications to stop prior to surgery. - Follow-up visit in 2 weeks with Dr. Alvan Dame - Begin physical therapy following surgery - Pre-operative lab work as pre-surgical testing - Prescriptions will be provided in hospital at time of discharge  Griffith Citron, PA-C Orthopedic Surgery [facility] [place] [phone]

## 2019-09-13 ENCOUNTER — Ambulatory Visit (HOSPITAL_COMMUNITY): Payer: Medicare HMO | Admitting: Physician Assistant

## 2019-09-13 ENCOUNTER — Encounter (HOSPITAL_COMMUNITY): Payer: Self-pay | Admitting: Orthopedic Surgery

## 2019-09-13 ENCOUNTER — Ambulatory Visit: Admit: 2019-09-13 | Payer: Medicare HMO | Admitting: Orthopedic Surgery

## 2019-09-13 ENCOUNTER — Other Ambulatory Visit: Payer: Self-pay

## 2019-09-13 ENCOUNTER — Encounter (HOSPITAL_COMMUNITY)
Admission: RE | Disposition: A | Discharge status: Home / Self Care | Payer: Self-pay | Source: Home / Self Care | Attending: Orthopedic Surgery

## 2019-09-13 ENCOUNTER — Observation Stay (HOSPITAL_COMMUNITY)
Admission: RE | Admit: 2019-09-13 | Discharge: 2019-09-15 | Disposition: A | Discharge status: Home / Self Care | Payer: Medicare HMO | Attending: Orthopedic Surgery | Admitting: Orthopedic Surgery

## 2019-09-13 ENCOUNTER — Ambulatory Visit (HOSPITAL_COMMUNITY): Payer: Medicare HMO | Admitting: Anesthesiology

## 2019-09-13 DIAGNOSIS — Z96652 Presence of left artificial knee joint: Secondary | ICD-10-CM | POA: Diagnosis not present

## 2019-09-13 DIAGNOSIS — I509 Heart failure, unspecified: Secondary | ICD-10-CM | POA: Insufficient documentation

## 2019-09-13 DIAGNOSIS — M1711 Unilateral primary osteoarthritis, right knee: Secondary | ICD-10-CM | POA: Diagnosis not present

## 2019-09-13 DIAGNOSIS — Z96651 Presence of right artificial knee joint: Secondary | ICD-10-CM

## 2019-09-13 DIAGNOSIS — E7800 Pure hypercholesterolemia, unspecified: Secondary | ICD-10-CM | POA: Insufficient documentation

## 2019-09-13 DIAGNOSIS — Z96653 Presence of artificial knee joint, bilateral: Secondary | ICD-10-CM | POA: Insufficient documentation

## 2019-09-13 DIAGNOSIS — G8918 Other acute postprocedural pain: Secondary | ICD-10-CM | POA: Diagnosis not present

## 2019-09-13 DIAGNOSIS — Z9884 Bariatric surgery status: Secondary | ICD-10-CM | POA: Diagnosis not present

## 2019-09-13 DIAGNOSIS — G4733 Obstructive sleep apnea (adult) (pediatric): Secondary | ICD-10-CM | POA: Insufficient documentation

## 2019-09-13 DIAGNOSIS — Z9989 Dependence on other enabling machines and devices: Secondary | ICD-10-CM | POA: Diagnosis not present

## 2019-09-13 DIAGNOSIS — I110 Hypertensive heart disease with heart failure: Secondary | ICD-10-CM | POA: Diagnosis not present

## 2019-09-13 DIAGNOSIS — E785 Hyperlipidemia, unspecified: Secondary | ICD-10-CM | POA: Diagnosis not present

## 2019-09-13 DIAGNOSIS — M25561 Pain in right knee: Secondary | ICD-10-CM | POA: Diagnosis present

## 2019-09-13 HISTORY — PX: TOTAL KNEE ARTHROPLASTY: SHX125

## 2019-09-13 LAB — TYPE AND SCREEN
ABO/RH(D): O POS
Antibody Screen: NEGATIVE

## 2019-09-13 SURGERY — ARTHROPLASTY, KNEE, TOTAL
Anesthesia: Spinal | Site: Knee | Laterality: Right

## 2019-09-13 SURGERY — ARTHROPLASTY, KNEE, TOTAL
Anesthesia: General | Site: Knee | Laterality: Right

## 2019-09-13 MED ORDER — TOBRAMYCIN SULFATE 1.2 G IJ SOLR
INTRAMUSCULAR | Status: AC
  Filled 2019-09-13: qty 1.2

## 2019-09-13 MED ORDER — LIDOCAINE 2% (20 MG/ML) 5 ML SYRINGE
INTRAMUSCULAR | Status: DC | PRN
  Administered 2019-09-13: 50 mg via INTRAVENOUS

## 2019-09-13 MED ORDER — PROPOFOL 10 MG/ML IV BOLUS
INTRAVENOUS | Status: AC
  Filled 2019-09-13: qty 20

## 2019-09-13 MED ORDER — DEXAMETHASONE SODIUM PHOSPHATE 10 MG/ML IJ SOLN
10.0000 mg | Freq: Once | INTRAMUSCULAR | Status: AC
  Administered 2019-09-14: 10 mg via INTRAVENOUS
  Filled 2019-09-13: qty 1

## 2019-09-13 MED ORDER — FENTANYL CITRATE (PF) 100 MCG/2ML IJ SOLN
INTRAMUSCULAR | Status: DC | PRN
  Administered 2019-09-13 (×3): 25 ug via INTRAVENOUS

## 2019-09-13 MED ORDER — SODIUM CHLORIDE (PF) 0.9 % IJ SOLN
INTRAMUSCULAR | Status: DC | PRN
  Administered 2019-09-13: 30 mL

## 2019-09-13 MED ORDER — SODIUM CHLORIDE (PF) 0.9 % IJ SOLN
INTRAMUSCULAR | Status: AC
  Filled 2019-09-13: qty 50

## 2019-09-13 MED ORDER — VANCOMYCIN HCL 1000 MG IV SOLR
INTRAVENOUS | Status: AC
  Filled 2019-09-13: qty 1000

## 2019-09-13 MED ORDER — PHENOL 1.4 % MT LIQD
1.0000 | OROMUCOSAL | Status: DC | PRN
  Filled 2019-09-13: qty 177

## 2019-09-13 MED ORDER — MAGNESIUM CITRATE PO SOLN: 1.0000 | Freq: Once | ORAL | Status: DC | PRN

## 2019-09-13 MED ORDER — LIDOCAINE 2% (20 MG/ML) 5 ML SYRINGE
INTRAMUSCULAR | Status: AC
  Filled 2019-09-13: qty 5

## 2019-09-13 MED ORDER — LORATADINE 10 MG PO TABS
10.0000 mg | ORAL_TABLET | Freq: Every day | ORAL | Status: DC
  Administered 2019-09-14 – 2019-09-15 (×2): 10 mg via ORAL
  Filled 2019-09-13 (×2): qty 1

## 2019-09-13 MED ORDER — LEVOCETIRIZINE DIHYDROCHLORIDE 5 MG PO TABS: 5.0000 mg | ORAL_TABLET | Freq: Every day | ORAL | Status: DC

## 2019-09-13 MED ORDER — DEXAMETHASONE SODIUM PHOSPHATE 10 MG/ML IJ SOLN
INTRAMUSCULAR | Status: AC
  Filled 2019-09-13: qty 1

## 2019-09-13 MED ORDER — BISACODYL 10 MG RE SUPP: 10.0000 mg | Freq: Every day | RECTAL | Status: DC | PRN

## 2019-09-13 MED ORDER — TRANEXAMIC ACID-NACL 1000-0.7 MG/100ML-% IV SOLN
1000.0000 mg | INTRAVENOUS | Status: AC
  Administered 2019-09-13: 1000 mg via INTRAVENOUS
  Filled 2019-09-13: qty 100

## 2019-09-13 MED ORDER — ACETAMINOPHEN 325 MG PO TABS: 325.0000 mg | ORAL_TABLET | Freq: Four times a day (QID) | ORAL | Status: DC | PRN

## 2019-09-13 MED ORDER — HYDROMORPHONE HCL 1 MG/ML IJ SOLN: 0.5000 mg | INTRAMUSCULAR | Status: DC | PRN

## 2019-09-13 MED ORDER — METOCLOPRAMIDE HCL 5 MG PO TABS: 5.0000 mg | ORAL_TABLET | Freq: Three times a day (TID) | ORAL | Status: DC | PRN

## 2019-09-13 MED ORDER — FENTANYL CITRATE (PF) 100 MCG/2ML IJ SOLN
INTRAMUSCULAR | Status: AC
  Filled 2019-09-13: qty 2

## 2019-09-13 MED ORDER — ALUM & MAG HYDROXIDE-SIMETH 200-200-20 MG/5ML PO SUSP: 15.0000 mL | ORAL | Status: DC | PRN

## 2019-09-13 MED ORDER — DIPHENHYDRAMINE HCL 12.5 MG/5ML PO ELIX: 12.5000 mg | ORAL_SOLUTION | ORAL | Status: DC | PRN

## 2019-09-13 MED ORDER — LACTATED RINGERS IV SOLN: INTRAVENOUS | Status: DC

## 2019-09-13 MED ORDER — HYDROCODONE-ACETAMINOPHEN 7.5-325 MG PO TABS: 1.0000 | ORAL_TABLET | ORAL | Status: DC | PRN

## 2019-09-13 MED ORDER — ROPIVACAINE HCL 7.5 MG/ML IJ SOLN
INTRAMUSCULAR | Status: DC | PRN
  Administered 2019-09-13: 20 mL via PERINEURAL

## 2019-09-13 MED ORDER — ONDANSETRON HCL 4 MG PO TABS: 4.0000 mg | ORAL_TABLET | Freq: Four times a day (QID) | ORAL | Status: DC | PRN

## 2019-09-13 MED ORDER — BUPIVACAINE-EPINEPHRINE 0.5% -1:200000 IJ SOLN
INTRAMUSCULAR | Status: AC
  Filled 2019-09-13: qty 1

## 2019-09-13 MED ORDER — HYDROCODONE-ACETAMINOPHEN 5-325 MG PO TABS
1.0000 | ORAL_TABLET | ORAL | Status: DC | PRN
  Administered 2019-09-13 – 2019-09-15 (×8): 2 via ORAL
  Administered 2019-09-15: 1 via ORAL
  Filled 2019-09-13 (×7): qty 2
  Filled 2019-09-13: qty 1
  Filled 2019-09-13: qty 2

## 2019-09-13 MED ORDER — ASPIRIN 81 MG PO CHEW
81.0000 mg | CHEWABLE_TABLET | Freq: Two times a day (BID) | ORAL | Status: DC
  Administered 2019-09-13 – 2019-09-15 (×4): 81 mg via ORAL
  Filled 2019-09-13 (×4): qty 1

## 2019-09-13 MED ORDER — ONDANSETRON HCL 4 MG/2ML IJ SOLN
INTRAMUSCULAR | Status: AC
  Filled 2019-09-13: qty 2

## 2019-09-13 MED ORDER — ORAL CARE MOUTH RINSE
15.0000 mL | Freq: Two times a day (BID) | OROMUCOSAL | Status: DC
  Administered 2019-09-13 – 2019-09-15 (×4): 15 mL via OROMUCOSAL

## 2019-09-13 MED ORDER — ONDANSETRON HCL 4 MG/2ML IJ SOLN
4.0000 mg | Freq: Four times a day (QID) | INTRAMUSCULAR | Status: DC | PRN
  Administered 2019-09-13 – 2019-09-14 (×2): 4 mg via INTRAVENOUS
  Filled 2019-09-13 (×2): qty 2

## 2019-09-13 MED ORDER — EPHEDRINE SULFATE-NACL 50-0.9 MG/10ML-% IV SOSY
PREFILLED_SYRINGE | INTRAVENOUS | Status: DC | PRN
  Administered 2019-09-13: 5 mg via INTRAVENOUS

## 2019-09-13 MED ORDER — ONDANSETRON HCL 4 MG/2ML IJ SOLN: 4.0000 mg | Freq: Four times a day (QID) | INTRAMUSCULAR | Status: DC | PRN

## 2019-09-13 MED ORDER — CEFAZOLIN SODIUM-DEXTROSE 2-4 GM/100ML-% IV SOLN
2.0000 g | Freq: Four times a day (QID) | INTRAVENOUS | Status: AC
  Administered 2019-09-13 (×2): 2 g via INTRAVENOUS
  Filled 2019-09-13 (×2): qty 100

## 2019-09-13 MED ORDER — ONDANSETRON HCL 4 MG/2ML IJ SOLN
INTRAMUSCULAR | Status: DC | PRN
  Administered 2019-09-13: 4 mg via INTRAVENOUS

## 2019-09-13 MED ORDER — METHOCARBAMOL 500 MG IVPB - SIMPLE MED
INTRAVENOUS | Status: AC
  Filled 2019-09-13: qty 50

## 2019-09-13 MED ORDER — FERROUS SULFATE 325 (65 FE) MG PO TABS
325.0000 mg | ORAL_TABLET | Freq: Two times a day (BID) | ORAL | Status: DC
  Administered 2019-09-14 – 2019-09-15 (×3): 325 mg via ORAL
  Filled 2019-09-13 (×3): qty 1

## 2019-09-13 MED ORDER — EPHEDRINE 5 MG/ML INJ
INTRAVENOUS | Status: AC
  Filled 2019-09-13: qty 10

## 2019-09-13 MED ORDER — BUPIVACAINE-EPINEPHRINE 0.5% -1:200000 IJ SOLN
INTRAMUSCULAR | Status: DC | PRN
  Administered 2019-09-13: 15 mL

## 2019-09-13 MED ORDER — METHOCARBAMOL 500 MG IVPB - SIMPLE MED
500.0000 mg | Freq: Four times a day (QID) | INTRAVENOUS | Status: DC | PRN
  Administered 2019-09-13: 500 mg via INTRAVENOUS
  Filled 2019-09-13: qty 50

## 2019-09-13 MED ORDER — KETOROLAC TROMETHAMINE 30 MG/ML IJ SOLN
INTRAMUSCULAR | Status: AC
  Filled 2019-09-13: qty 1

## 2019-09-13 MED ORDER — DOCUSATE SODIUM 100 MG PO CAPS
100.0000 mg | ORAL_CAPSULE | Freq: Two times a day (BID) | ORAL | Status: DC
  Administered 2019-09-13 – 2019-09-15 (×3): 100 mg via ORAL
  Filled 2019-09-13 (×4): qty 1

## 2019-09-13 MED ORDER — CELECOXIB 200 MG PO CAPS
200.0000 mg | ORAL_CAPSULE | Freq: Two times a day (BID) | ORAL | Status: DC
  Administered 2019-09-13 – 2019-09-15 (×4): 200 mg via ORAL
  Filled 2019-09-13 (×4): qty 1

## 2019-09-13 MED ORDER — POLYETHYLENE GLYCOL 3350 17 G PO PACK
17.0000 g | PACK | Freq: Two times a day (BID) | ORAL | Status: DC
  Administered 2019-09-14 – 2019-09-15 (×2): 17 g via ORAL
  Filled 2019-09-13 (×4): qty 1

## 2019-09-13 MED ORDER — MIDAZOLAM HCL 2 MG/2ML IJ SOLN
1.0000 mg | INTRAMUSCULAR | Status: DC
  Administered 2019-09-13: 1 mg via INTRAVENOUS
  Filled 2019-09-13: qty 2

## 2019-09-13 MED ORDER — STERILE WATER FOR IRRIGATION IR SOLN
Status: DC | PRN
  Administered 2019-09-13: 2000 mL

## 2019-09-13 MED ORDER — POVIDONE-IODINE 10 % EX SWAB
2.0000 "application " | Freq: Once | CUTANEOUS | Status: AC
  Administered 2019-09-13: 2 via TOPICAL

## 2019-09-13 MED ORDER — TRANEXAMIC ACID-NACL 1000-0.7 MG/100ML-% IV SOLN
1000.0000 mg | Freq: Once | INTRAVENOUS | Status: AC
  Administered 2019-09-13: 1000 mg via INTRAVENOUS
  Filled 2019-09-13: qty 100

## 2019-09-13 MED ORDER — CHLORHEXIDINE GLUCONATE 4 % EX LIQD: 60.0000 mL | Freq: Once | CUTANEOUS | Status: DC

## 2019-09-13 MED ORDER — METOCLOPRAMIDE HCL 5 MG/ML IJ SOLN
5.0000 mg | Freq: Three times a day (TID) | INTRAMUSCULAR | Status: DC | PRN
  Administered 2019-09-13: 10 mg via INTRAVENOUS
  Filled 2019-09-13: qty 2

## 2019-09-13 MED ORDER — SODIUM CHLORIDE 0.9 % IV SOLN: INTRAVENOUS | Status: DC

## 2019-09-13 MED ORDER — DEXAMETHASONE SODIUM PHOSPHATE 10 MG/ML IJ SOLN
10.0000 mg | Freq: Once | INTRAMUSCULAR | Status: AC
  Administered 2019-09-13: 10 mg via INTRAVENOUS

## 2019-09-13 MED ORDER — PROPOFOL 10 MG/ML IV BOLUS
INTRAVENOUS | Status: DC | PRN
  Administered 2019-09-13: 170 mg via INTRAVENOUS

## 2019-09-13 MED ORDER — KETOROLAC TROMETHAMINE 30 MG/ML IJ SOLN
INTRAMUSCULAR | Status: DC | PRN
  Administered 2019-09-13: 30 mg

## 2019-09-13 MED ORDER — CEFAZOLIN SODIUM-DEXTROSE 2-4 GM/100ML-% IV SOLN
2.0000 g | INTRAVENOUS | Status: AC
  Administered 2019-09-13: 2 g via INTRAVENOUS
  Filled 2019-09-13: qty 100

## 2019-09-13 MED ORDER — SODIUM CHLORIDE 0.9 % IR SOLN
Status: DC | PRN
  Administered 2019-09-13: 1000 mL

## 2019-09-13 MED ORDER — MENTHOL 3 MG MT LOZG: 1.0000 | LOZENGE | OROMUCOSAL | Status: DC | PRN

## 2019-09-13 MED ORDER — FENTANYL CITRATE (PF) 100 MCG/2ML IJ SOLN
25.0000 ug | INTRAMUSCULAR | Status: DC | PRN
  Administered 2019-09-13: 50 ug via INTRAVENOUS
  Administered 2019-09-13: 25 ug via INTRAVENOUS
  Administered 2019-09-13: 50 ug via INTRAVENOUS

## 2019-09-13 MED ORDER — FENTANYL CITRATE (PF) 100 MCG/2ML IJ SOLN
50.0000 ug | INTRAMUSCULAR | Status: DC
  Administered 2019-09-13: 50 ug via INTRAVENOUS
  Filled 2019-09-13: qty 2

## 2019-09-13 MED ORDER — METHOCARBAMOL 500 MG PO TABS
500.0000 mg | ORAL_TABLET | Freq: Four times a day (QID) | ORAL | Status: DC | PRN
  Administered 2019-09-14 – 2019-09-15 (×4): 500 mg via ORAL
  Filled 2019-09-13 (×4): qty 1

## 2019-09-13 SURGICAL SUPPLY — 61 items
ATTUNE MED ANAT PAT 38 KNEE (Knees) ×2 IMPLANT
ATTUNE PS FEM RT SZ 7 CEM KNEE (Femur) ×2 IMPLANT
ATTUNE PSRP INSR SZ7 7 KNEE (Insert) ×2 IMPLANT
BAG ZIPLOCK 12X15 (MISCELLANEOUS) ×2 IMPLANT
BASE TIBIAL ROT PLAT SZ 8 KNEE (Knees) ×1 IMPLANT
BLADE SAW SGTL 11.0X1.19X90.0M (BLADE) IMPLANT
BLADE SAW SGTL 13.0X1.19X90.0M (BLADE) ×2 IMPLANT
BLADE SURG SZ10 CARB STEEL (BLADE) ×4 IMPLANT
BNDG ELASTIC 6X10 VLCR STRL LF (GAUZE/BANDAGES/DRESSINGS) ×2 IMPLANT
BNDG ELASTIC 6X5.8 VLCR STR LF (GAUZE/BANDAGES/DRESSINGS) ×2 IMPLANT
BOWL SMART MIX CTS (DISPOSABLE) ×2 IMPLANT
CEMENT HV SMART SET (Cement) ×4 IMPLANT
COVER SURGICAL LIGHT HANDLE (MISCELLANEOUS) ×2 IMPLANT
COVER WAND RF STERILE (DRAPES) ×2 IMPLANT
CUFF TOURN SGL QUICK 34 (TOURNIQUET CUFF) ×1
CUFF TRNQT CYL 34X4.125X (TOURNIQUET CUFF) ×1 IMPLANT
DECANTER SPIKE VIAL GLASS SM (MISCELLANEOUS) ×4 IMPLANT
DERMABOND ADVANCED (GAUZE/BANDAGES/DRESSINGS) ×1
DERMABOND ADVANCED .7 DNX12 (GAUZE/BANDAGES/DRESSINGS) ×1 IMPLANT
DRAPE U-SHAPE 47X51 STRL (DRAPES) ×2 IMPLANT
DRESSING AQUACEL AG SP 3.5X10 (GAUZE/BANDAGES/DRESSINGS) ×1 IMPLANT
DRSG AQUACEL AG SP 3.5X10 (GAUZE/BANDAGES/DRESSINGS) ×2
DURAPREP 26ML APPLICATOR (WOUND CARE) ×4 IMPLANT
ELECT REM PT RETURN 15FT ADLT (MISCELLANEOUS) ×2 IMPLANT
GLOVE BIO SURGEON STRL SZ 6 (GLOVE) IMPLANT
GLOVE BIOGEL PI IND STRL 6.5 (GLOVE) IMPLANT
GLOVE BIOGEL PI IND STRL 7.5 (GLOVE) ×1 IMPLANT
GLOVE BIOGEL PI IND STRL 8.5 (GLOVE) ×1 IMPLANT
GLOVE BIOGEL PI INDICATOR 6.5 (GLOVE)
GLOVE BIOGEL PI INDICATOR 7.5 (GLOVE) ×1
GLOVE BIOGEL PI INDICATOR 8.5 (GLOVE) ×1
GLOVE ECLIPSE 8.0 STRL XLNG CF (GLOVE) ×2 IMPLANT
GLOVE ORTHO TXT STRL SZ7.5 (GLOVE) ×2 IMPLANT
GOWN STRL REUS W/ TWL LRG LVL3 (GOWN DISPOSABLE) ×1 IMPLANT
GOWN STRL REUS W/TWL 2XL LVL3 (GOWN DISPOSABLE) ×2 IMPLANT
GOWN STRL REUS W/TWL LRG LVL3 (GOWN DISPOSABLE) ×3 IMPLANT
HANDPIECE INTERPULSE COAX TIP (DISPOSABLE) ×1
HOLDER FOLEY CATH W/STRAP (MISCELLANEOUS) ×2 IMPLANT
KIT TURNOVER KIT A (KITS) IMPLANT
MANIFOLD NEPTUNE II (INSTRUMENTS) ×2 IMPLANT
NDL SAFETY ECLIPSE 18X1.5 (NEEDLE) ×1 IMPLANT
NEEDLE HYPO 18GX1.5 SHARP (NEEDLE) ×1
NS IRRIG 1000ML POUR BTL (IV SOLUTION) ×2 IMPLANT
PACK TOTAL KNEE CUSTOM (KITS) ×2 IMPLANT
PENCIL SMOKE EVACUATOR (MISCELLANEOUS) IMPLANT
PIN DRILL FIX HALF THREAD (BIT) ×2 IMPLANT
PIN FIX SIGMA LCS THRD HI (PIN) ×2 IMPLANT
PROTECTOR NERVE ULNAR (MISCELLANEOUS) ×2 IMPLANT
SET HNDPC FAN SPRY TIP SCT (DISPOSABLE) ×1 IMPLANT
SET PAD KNEE POSITIONER (MISCELLANEOUS) ×2 IMPLANT
SUT MNCRL AB 4-0 PS2 18 (SUTURE) ×2 IMPLANT
SUT STRATAFIX PDS+ 0 24IN (SUTURE) ×2 IMPLANT
SUT VIC AB 1 CT1 36 (SUTURE) ×4 IMPLANT
SUT VIC AB 2-0 CT1 27 (SUTURE) ×3
SUT VIC AB 2-0 CT1 TAPERPNT 27 (SUTURE) ×3 IMPLANT
SYR 3ML LL SCALE MARK (SYRINGE) ×2 IMPLANT
TIBIAL BASE ROT PLAT SZ 8 KNEE (Knees) ×2 IMPLANT
TRAY FOLEY MTR SLVR 16FR STAT (SET/KITS/TRAYS/PACK) ×2 IMPLANT
WATER STERILE IRR 1000ML POUR (IV SOLUTION) ×4 IMPLANT
WRAP KNEE MAXI GEL POST OP (GAUZE/BANDAGES/DRESSINGS) ×2 IMPLANT
YANKAUER SUCT BULB TIP 10FT TU (MISCELLANEOUS) ×2 IMPLANT

## 2019-09-13 NOTE — Anesthesia Procedure Notes (Signed)
Procedure Name: LMA Insertion Date/Time: 09/13/2019 8:54 AM Performed by: Anne Fu, CRNA Pre-anesthesia Checklist: Patient identified, Emergency Drugs available, Suction available, Patient being monitored and Timeout performed Patient Re-evaluated:Patient Re-evaluated prior to induction Oxygen Delivery Method: Circle system utilized Preoxygenation: Pre-oxygenation with 100% oxygen Induction Type: IV induction Ventilation: Mask ventilation without difficulty LMA: LMA inserted LMA Size: 5.0 Number of attempts: 1 Placement Confirmation: positive ETCO2 and breath sounds checked- equal and bilateral Tube secured with: Tape

## 2019-09-13 NOTE — Anesthesia Preprocedure Evaluation (Signed)
Anesthesia Evaluation  Patient identified by MRN, date of birth, ID band Patient awake    Reviewed: Allergy & Precautions, H&P , NPO status , Patient's Chart, lab work & pertinent test results  Airway Mallampati: II   Neck ROM: full    Dental   Pulmonary sleep apnea ,    breath sounds clear to auscultation       Cardiovascular hypertension,  Rhythm:regular Rate:Normal     Neuro/Psych    GI/Hepatic H/o gastric bypass   Endo/Other  obese  Renal/GU      Musculoskeletal  (+) Arthritis ,   Abdominal   Peds  Hematology   Anesthesia Other Findings   Reproductive/Obstetrics                             Anesthesia Physical Anesthesia Plan  ASA: II  Anesthesia Plan: General   Post-op Pain Management:  Regional for Post-op pain   Induction: Intravenous  PONV Risk Score and Plan: 2 and Ondansetron, Dexamethasone, Midazolam and Treatment may vary due to age or medical condition  Airway Management Planned: LMA  Additional Equipment:   Intra-op Plan:   Post-operative Plan:   Informed Consent: I have reviewed the patients History and Physical, chart, labs and discussed the procedure including the risks, benefits and alternatives for the proposed anesthesia with the patient or authorized representative who has indicated his/her understanding and acceptance.       Plan Discussed with: CRNA, Anesthesiologist and Surgeon  Anesthesia Plan Comments:         Anesthesia Quick Evaluation

## 2019-09-13 NOTE — Progress Notes (Signed)
Assisted Dr. Hodierne with right, ultrasound guided, adductor canal block. Side rails up, monitors on throughout procedure. See vital signs in flow sheet. Tolerated Procedure well.  

## 2019-09-13 NOTE — Evaluation (Signed)
Physical Therapy Evaluation Patient Details Name: Brandon Marks MRN: [id number] DOB: [birth year] Today's Date: 09/13/2019   History of Present Illness  Patient is 64 y.o. male s/p right  TKA on 09/13/19, S/P LTKA 08/11/19 PMH significant for perirectal abscess, HTN, gastic bypass surgery.  Clinical Impression  The patient  Reports nausea, emesis with attempts to mobilize. Patient did perform several right  leg raises with a lag, and reports right foot still feels a little numb. Progress activity tonorrow when patient is able. Pt admitted with above diagnosis. Pt currently with functional limitations due to the deficits listed below (see PT Problem List). Pt will benefit from skilled PT to increase their independence and safety with mobility to allow discharge to the venue listed below.       Follow Up Recommendations Follow surgeon's recommendation for DC plan and follow-up therapies    Equipment Recommendations  None recommended by PT    Recommendations for Other Services       Precautions / Restrictions Precautions Precautions: Fall;Knee Restrictions Weight Bearing Restrictions: No RLE Weight Bearing: Weight bearing as tolerated      Mobility  Bed Mobility               General bed mobility comments: attempted to sit up with increased nausea, had some emesis just before.  Unable to dangle.  Transfers Overall transfer level: Needs assistance               General transfer comment: unable due to N/V  Ambulation/Gait                Stairs            Wheelchair Mobility    Modified Rankin (Stroke Patients Only)       Balance                                             Pertinent Vitals/Pain Faces Pain Scale: Hurts little more Pain Location: right Pain Descriptors / Indicators: Sore;Aching Pain Intervention(s): Monitored during session;Premedicated before session;Ice applied    Home Living Family/patient expects to be  discharged to:: Private residence Living Arrangements: Parent Available Help at Discharge: Available PRN/intermittently Type of Home: House Home Access: Stairs to enter Entrance Stairs-Rails: Left Entrance Stairs-Number of Steps: 2 Home Layout: One level Home Equipment: Shower seat - built in      Prior Function Level of Independence: Independent               Hand Dominance   Dominant Hand: Right    Extremity/Trunk Assessment   Upper Extremity Assessment Upper Extremity Assessment: Overall WFL for tasks assessed    Lower Extremity Assessment Lower Extremity Assessment: RLE deficits/detail RLE Deficits / Details: able to SLR with lag, states leg remains somewhat numb, LLE Sensation: WNL       Communication   Communication: No difficulties  Cognition Arousal/Alertness: Awake/alert Behavior During Therapy: WFL for tasks assessed/performed Overall Cognitive Status: Within Functional Limits for tasks assessed                                        General Comments      Exercises Total Joint Exercises Ankle Circles/Pumps: PROM Quad Sets: AROM;Supine;Right;5 reps Straight Leg Raises: AAROM;Supine;5 reps;Right Goniometric ROM: tolerated ~10-45  knee flex   Assessment/Plan    PT Assessment Patient needs continued PT services  PT Problem List Decreased strength;Decreased range of motion;Decreased mobility;Decreased balance;Decreased activity tolerance;Decreased knowledge of use of DME       PT Treatment Interventions DME instruction;Functional mobility training;Balance training;Patient/family education;Gait training;Therapeutic activities;Therapeutic exercise;Stair training    PT Goals (Current goals can be found in the Care Plan section)  Acute Rehab PT Goals Patient Stated Goal: get back to drag racing PT Goal Formulation: With patient Time For Goal Achievement: 09/20/19 Potential to Achieve Goals: Good    Frequency 7X/week   Barriers  to discharge        Co-evaluation               AM-PAC PT "6 Clicks" Mobility  Outcome Measure Help needed turning from your back to your side while in a flat bed without using bedrails?: A Lot Help needed moving from lying on your back to sitting on the side of a flat bed without using bedrails?: A Lot Help needed moving to and from a bed to a chair (including a wheelchair)?: A Lot Help needed standing up from a chair using your arms (e.g., wheelchair or bedside chair)?: A Lot Help needed to walk in hospital room?: A Lot Help needed climbing 3-5 steps with a railing? : A Lot 6 Click Score: 12    End of Session   Activity Tolerance: Treatment limited secondary to medical complications (Comment)(N/V) Patient left: in bed;with call bell/phone within reach Nurse Communication: Mobility status(emesis) PT Visit Diagnosis: Muscle weakness (generalized) (M62.81);Difficulty in walking, not elsewhere classified (R26.2)    Time: [id number] PT Time Calculation (min) (ACUTE ONLY): 36 min   Charges:   PT Evaluation $PT Eval Low Complexity: 1 Low PT Treatments $Therapeutic Exercise: 8-22 mins        Tresa Endo PT Acute Rehabilitation Services Pager [phone] Office [phone]   Claretha Cooper 09/13/2019, 4:47 PM

## 2019-09-13 NOTE — Interval H&P Note (Signed)
History and Physical Interval Note:  09/13/2019 7:15 AM  Brandon Marks  has presented today for surgery, with the diagnosis of Right knee osteoarthritis.  The various methods of treatment have been discussed with the patient and family. After consideration of risks, benefits and other options for treatment, the patient has consented to  Procedure(s) with comments: TOTAL KNEE ARTHROPLASTY (Right) - 70 mins as a surgical intervention.  [name] history has been reviewed, patient examined, no change in status, stable for surgery.  I have reviewed the patient's chart and labs.  Questions were answered to the patient's satisfaction.     [name]

## 2019-09-13 NOTE — Anesthesia Procedure Notes (Signed)
Anesthesia Regional Block: Adductor canal block   Pre-Anesthetic Checklist: ,, timeout performed, Correct Patient, Correct Site, Correct Laterality, Correct Procedure, Correct Position, site marked, Risks and benefits discussed,  Surgical consent,  Pre-op evaluation,  At surgeon's request and post-op pain management  Laterality: Right  Prep: chloraprep       Needles:  Injection technique: Single-shot  Needle Type: Echogenic Needle     Needle Length: 9cm  Needle Gauge: 21     Additional Needles:   Narrative:  Start time: 09/13/2019 7:53 AM End time: 09/13/2019 7:59 AM Injection made incrementally with aspirations every 5 mL.  Performed by: Personally  Anesthesiologist: Albertha Ghee, MD  Additional Notes: Pt tolerated the procedure well.

## 2019-09-13 NOTE — Transfer of Care (Signed)
Immediate Anesthesia Transfer of Care Note  Patient: Brandon Marks  Procedure(s) Performed: Procedure(s) with comments: TOTAL KNEE ARTHROPLASTY (Right) - 70 mins  Patient Location: PACU  Anesthesia Type:General  Level of Consciousness:  sedated, patient cooperative and responds to stimulation  Airway & Oxygen Therapy:Patient Spontanous Breathing and Patient connected to face mask oxgen  Post-op Assessment:  Report given to PACU RN and Post -op Vital signs reviewed and stable  Post vital signs:  Reviewed and stable  Last Vitals:  Vitals:   09/13/19 0835 09/13/19 0840  BP: 123/80 128/81  Pulse: 68 65  Resp: 13 17  Temp:    SpO2: 123XX123 123XX123    Complications: No apparent anesthesia complications

## 2019-09-13 NOTE — Op Note (Signed)
NAME:  Brandon Marks RECORD NO.:  [id number]                             FACILITY:  [facility]      PHYSICIAN:  Pietro Cassis. Alvan Dame, M.D.  DATE OF BIRTH:  [birth year]      DATE OF PROCEDURE:  09/13/2019                                     OPERATIVE REPORT         PREOPERATIVE DIAGNOSIS:  Right knee osteoarthritis.      POSTOPERATIVE DIAGNOSIS:  Right knee osteoarthritis.      FINDINGS:  The patient was noted to have complete loss of cartilage and   bone-on-bone arthritis with associated osteophytes in the medial and patellofemoral compartments of   the knee with a significant synovitis and associated effusion.  The patient had failed months of conservative treatment including medications, injection therapy, activity modification.     PROCEDURE:  Right total knee replacement.      COMPONENTS USED:  DePuy Attune rotating platform posterior stabilized knee   system, a size 7 femur, 8 tibia, size 7 mm PS AOX insert, and 38 anatomic patellar   button.      SURGEON:  Pietro Cassis. Alvan Dame, M.D.      ASSISTANT:  Danae Orleans, PA-C.      ANESTHESIA:  General and Regional.      SPECIMENS:  None.      COMPLICATION:  None.      DRAINS:  None.  EBL: <100cc      TOURNIQUET TIME:   Total Tourniquet Time Documented: Thigh (Right) - 34 minutes Total: Thigh (Right) - 34 minutes  .      The patient was stable to the recovery room.      INDICATION FOR PROCEDURE:  Brandon Marks is a 64 y.o. male patient of   mine.  The patient had been seen, evaluated, and treated for months conservatively in the   office with medication, activity modification, and injections.  The patient had   radiographic changes of bone-on-bone arthritis with endplate sclerosis and osteophytes noted.  Based on the radiographic changes and failed conservative measures, the patient   decided to proceed with definitive treatment, total knee replacement.  Risks of infection, DVT, component failure,  need for revision surgery, neurovascular injury were reviewed in the office setting.  The postop course was reviewed stressing the efforts to maximize post-operative satisfaction and function.  Consent was obtained for benefit of pain   relief.      PROCEDURE IN DETAIL:  The patient was brought to the operative theater.   Once adequate anesthesia, preoperative antibiotics, 2 gm of Ancef,1 gm of Tranexamic Acid, and 10 mg of Decadron administered, the patient was positioned supine with a right thigh tourniquet placed.  The  right lower extremity was prepped and draped in sterile fashion.  A time-   out was performed identifying the patient, planned procedure, and the appropriate extremity.      The right lower extremity was placed in the [facility] leg holder.  The leg was   exsanguinated, tourniquet elevated to 250 mmHg.  A midline incision was   made  followed by median parapatellar arthrotomy.  Following initial   exposure, attention was first directed to the patella.  Precut   measurement was noted to be 26 mm.  I resected down to 14-15 mm and used a   38 anatomic patellar button to restore patellar height as well as cover the cut surface.      The lug holes were drilled and a metal shim was placed to protect the   patella from retractors and saw blade during the procedure.      At this point, attention was now directed to the femur.  The femoral   canal was opened with a drill, irrigated to try to prevent fat emboli.  An   intramedullary rod was passed at 5 degrees valgus, 9 mm of bone was   resected off the distal femur.  Following this resection, the tibia was   subluxated anteriorly.  Using the extramedullary guide, 2 mm of bone was resected off   the proximal medial tibia.  We confirmed the gap would be   stable medially and laterally with a size size 5 spacer block as well as confirmed that the tibial cut was perpendicular in the coronal plane, checking with an alignment rod.      Once  this was done, I sized the femur to be a size 7 in the anterior-   posterior dimension, chose a standard component based on medial and   lateral dimension.  The size 7 rotation block was then pinned in   position anterior referenced using the C-clamp to set rotation.  The   anterior, posterior, and  chamfer cuts were made without difficulty nor   notching making certain that I was along the anterior cortex to help   with flexion gap stability.      The final box cut was made off the lateral aspect of distal femur.      At this point, the tibia was sized to be a size 8.  The size 8 tray was   then pinned in position through the medial third of the tubercle,   drilled, and keel punched.  Trial reduction was now carried with a 7 femur,  8 tibia, a size 7 mm PS insert, and the 38 anatomic patella botton.  The knee was brought to full extension with good flexion stability with the patella   tracking through the trochlea without application of pressure.  Given   all these findings the trial components removed.  Final components were   opened and cement was mixed.  The knee was irrigated with normal saline solution and pulse lavage.  The synovial lining was   then injected with 30 cc of 0.25% Marcaine with epinephrine, 1 cc of Toradol and 30 cc of NS for a total of 61 cc.     Final implants were then cemented onto cleaned and dried cut surfaces of bone with the knee brought to extension with a size 7 mm PS trial insert.      Once the cement had fully cured, excess cement was removed   throughout the knee.  I confirmed that I was satisfied with the range of   motion and stability, and the final size 7 mm PS AOX insert was chosen.  It was   placed into the knee.      The tourniquet had been let down at 34 minutes.  No significant   hemostasis was required.  The extensor mechanism was then reapproximated using #1 Vicryl  and #1 Stratafix sutures with the knee   in flexion.  The   remaining wound  was closed with 2-0 Vicryl and running 4-0 Monocryl.   The knee was cleaned, dried, dressed sterilely using Dermabond and   Aquacel dressing.  The patient was then   brought to recovery room in stable condition, tolerating the procedure   well.   Please note that Physician Assistant, Danae Orleans, PA-C was present for the entirety of the case, and was utilized for pre-operative positioning, peri-operative retractor management, general facilitation of the procedure and for primary wound closure at the end of the case.              Pietro Cassis Alvan Dame, M.D.    09/13/2019 10:20 AM

## 2019-09-13 NOTE — Discharge Instructions (Signed)

## 2019-09-13 NOTE — Anesthesia Postprocedure Evaluation (Signed)
Anesthesia Post Note  Patient: Brandon Marks  Procedure(s) Performed: TOTAL KNEE ARTHROPLASTY (Right Knee)     Patient location during evaluation: PACU Anesthesia Type: General and Regional Level of consciousness: awake and alert Pain management: pain level controlled Vital Signs Assessment: post-procedure vital signs reviewed and stable Respiratory status: spontaneous breathing, nonlabored ventilation, respiratory function stable and patient connected to nasal cannula oxygen Cardiovascular status: blood pressure returned to baseline and stable Postop Assessment: no apparent nausea or vomiting Anesthetic complications: no    Last Vitals:  Vitals:   09/13/19 1315 09/13/19 1332  BP: 117/65 125/78  Pulse: 65 82  Resp: 18 17  Temp: (!) 36.4 C 36.8 C  SpO2: 100% 100%    Last Pain:  Vitals:   09/13/19 1340  TempSrc:   PainSc: [place]

## 2019-09-14 DIAGNOSIS — Z96652 Presence of left artificial knee joint: Secondary | ICD-10-CM | POA: Diagnosis not present

## 2019-09-14 DIAGNOSIS — E7800 Pure hypercholesterolemia, unspecified: Secondary | ICD-10-CM | POA: Diagnosis not present

## 2019-09-14 DIAGNOSIS — I110 Hypertensive heart disease with heart failure: Secondary | ICD-10-CM | POA: Diagnosis not present

## 2019-09-14 DIAGNOSIS — M1711 Unilateral primary osteoarthritis, right knee: Secondary | ICD-10-CM | POA: Diagnosis not present

## 2019-09-14 DIAGNOSIS — I509 Heart failure, unspecified: Secondary | ICD-10-CM | POA: Diagnosis not present

## 2019-09-14 DIAGNOSIS — Z9884 Bariatric surgery status: Secondary | ICD-10-CM | POA: Diagnosis not present

## 2019-09-14 DIAGNOSIS — Z96653 Presence of artificial knee joint, bilateral: Secondary | ICD-10-CM | POA: Diagnosis not present

## 2019-09-14 DIAGNOSIS — G4733 Obstructive sleep apnea (adult) (pediatric): Secondary | ICD-10-CM | POA: Diagnosis not present

## 2019-09-14 LAB — CBC
HCT: 32 % — ABNORMAL LOW (ref 39.0–52.0)
Hemoglobin: 10.1 g/dL — ABNORMAL LOW (ref 13.0–17.0)
MCH: 30.4 pg (ref 26.0–34.0)
MCHC: 31.6 g/dL (ref 30.0–36.0)
MCV: 96.4 fL (ref 80.0–100.0)
Platelets: 216 10*3/uL (ref 150–400)
RBC: 3.32 MIL/uL — ABNORMAL LOW (ref 4.22–5.81)
RDW: 13.4 % (ref 11.5–15.5)
WBC: 11 10*3/uL — ABNORMAL HIGH (ref 4.0–10.5)
nRBC: 0 % (ref 0.0–0.2)

## 2019-09-14 LAB — BASIC METABOLIC PANEL
Anion gap: 5 (ref 5–15)
BUN: 27 mg/dL — ABNORMAL HIGH (ref 8–23)
CO2: 27 mmol/L (ref 22–32)
Calcium: 8.5 mg/dL — ABNORMAL LOW (ref 8.9–10.3)
Chloride: 108 mmol/L (ref 98–111)
Creatinine, Ser: 1.06 mg/dL (ref 0.61–1.24)
GFR calc Af Amer: >60 mL/min (ref >60)
GFR calc non Af Amer: >60 mL/min (ref >60)
Glucose, Bld: 111 mg/dL — ABNORMAL HIGH (ref 70–99)
Potassium: 4.8 mmol/L (ref 3.5–5.1)
Sodium: 140 mmol/L (ref 135–145)

## 2019-09-14 MED ORDER — FERROUS SULFATE 325 (65 FE) MG PO TABS: 325.0000 mg | ORAL_TABLET | Freq: Three times a day (TID) | ORAL | 0 refills | Status: DC

## 2019-09-14 MED ORDER — POLYETHYLENE GLYCOL 3350 17 G PO PACK: 17.0000 g | PACK | Freq: Two times a day (BID) | ORAL | 0 refills | Status: DC

## 2019-09-14 MED ORDER — DOCUSATE SODIUM 100 MG PO CAPS: 100.0000 mg | ORAL_CAPSULE | Freq: Two times a day (BID) | ORAL | 0 refills | Status: DC

## 2019-09-14 MED ORDER — METHOCARBAMOL 500 MG PO TABS: 500.0000 mg | ORAL_TABLET | Freq: Four times a day (QID) | ORAL | 0 refills | Status: DC | PRN

## 2019-09-14 MED ORDER — ASPIRIN 81 MG PO CHEW: 81.0000 mg | CHEWABLE_TABLET | Freq: Two times a day (BID) | ORAL | 0 refills | Status: AC

## 2019-09-14 MED ORDER — HYDROCODONE-ACETAMINOPHEN 5-325 MG PO TABS: 1.0000 | ORAL_TABLET | Freq: Four times a day (QID) | ORAL | 0 refills | Status: DC | PRN

## 2019-09-14 NOTE — TOC Initial Note (Signed)
Transition of Care [facility]) - Initial/Assessment Note    Patient Details  Name: Brandon Marks MRN: [id number] Date of Birth: 1956-04-19  [facility] of [facility]) CM/SW Contact:    Lia Hopping, LCSW Phone Number: 09/14/2019, 11:10 AM  Clinical Narrative:                 CSW discussed [facility] and provided choice. Patient prefers [facility]. CSW reached out to representative Ronalee Belts to and confirm staff availability.  Patient has DME.   Expected Discharge Plan: [place] Barriers to Discharge: No Barriers Identified   Patient Goals and CMS Choice Patient states their goals for this hospitalization and ongoing recovery are:: Return Home CMS Medicare.gov Compare Post Acute Care list provided to:: Patient Choice offered to / list presented to : Patient  Expected Discharge Plan and Services Expected Discharge Plan: [place] In-house Referral: Clinical Social Work Discharge Planning Services: CM Consult   Living arrangements for the past 2 months: [place] Expected Discharge Date: 09/14/19               DME Arranged: N/A DME Agency: (Has DME)       HH Arranged: PT [place] Agency: [facility] (formerly [employer]) Date [place]: 09/14/19 Time [place]: 1109 Representative spoke with at [place]: [place] Arrangements/Services Living arrangements for the past 2 months: [place] Lives with:: Self Patient language and need for interpreter reviewed:: Yes Do you feel safe going back to the place where you live?: Yes      Need for Family Participation in Patient Care: Yes (Comment) Care giver support system in place?: Yes (comment) Current home services: DME Criminal Activity/Legal Involvement Pertinent to Current Situation/Hospitalization: No - Comment as needed  Activities of Daily Living Home Assistive Devices/Equipment: [place] Rile (specify type), Raised toilet seat with  rails, Cane (specify quad or straight) ADL Screening (condition at time of admission) Patient's cognitive ability adequate to safely complete daily activities?: Yes Is the patient deaf or have difficulty hearing?: No Does the patient have difficulty seeing, even when wearing glasses/contacts?: No Does the patient have difficulty concentrating, remembering, or making decisions?: No Patient able to express need for assistance with ADLs?: Yes Does the patient have difficulty dressing or bathing?: No Independently performs ADLs?: Yes (appropriate for developmental age) Does the patient have difficulty walking or climbing stairs?: No Weakness of Legs: None Weakness of Arms/Hands: None  Permission Sought/Granted                  Emotional Assessment Appearance:: Appears stated age Attitude/Demeanor/Rapport: Engaged Affect (typically observed): Accepting Orientation: : Oriented to Self, Oriented to Place, Oriented to  Time, Oriented to Situation Alcohol / Substance Use: Not Applicable Psych Involvement: No (comment)  Admission diagnosis:  Status post total right knee replacement [Z96.651] Patient Active Problem List   Diagnosis Date Noted  . S/P right TKA 09/13/2019  . S/P left TKA 08/11/2019  . Genu varum of both lower extremities 06/13/2019  . Vitamin B12 deficiency 06/13/2019  . Obesity (BMI 30-39.9) 12/27/2018  . 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 Tinnitus of right ear 12/27/2018  . Gait disturbance 12/27/2018  . Normochromic anemia 12/27/2018  . Primary osteoarthritis of both knees 12/27/2018  . Diastolic heart failure ([place]) 09/11/2017  . DDD (degenerative disc disease), cervical 09/04/2017  . Dyspnea, chronic DOE 02/27/2012  . Herniated disc   .  Hyperlipidemia 02/23/2009  . 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 Essential hypertension 10/18/2007  . OSA on CPAP 10/11/2007   PCP:  Ladell Pier, MD Pharmacy:   [place], [place] [address] Phone: [phone] Fax: [facility] [place], [place] [address] Phone: [phone] Fax: [phone]     Social Determinants of Health (SDOH) Interventions    Readmission Risk Interventions No flowsheet data found.

## 2019-09-14 NOTE — Progress Notes (Signed)
     Subjective: 1 Day Post-Op Procedure(s) (LRB): TOTAL KNEE ARTHROPLASTY (Right)   Patient reports pain as moderate, pain controlled with medication.  Has not had any analgesic medications since last night.  No reported events throughout the night.  Discussed the procedure and expectations moving forward.  Ready to be discharged home, if he does well with PT.     Patient's anticipated LOS is less than 2 midnights, meeting these requirements: - Lives within 1 hour of care - Has a competent adult at home to recover with post-op recover - NO history of  - Chronic pain requiring opiods  - Diabetes  - Coronary Artery Disease  - Heart failure  - Heart attack  - Stroke  - DVT/VTE  - Cardiac arrhythmia  - Respiratory Failure/COPD  - Renal failure  - Anemia  - Advanced Liver disease     Objective:   VITALS:   Vitals:   09/14/19 0145 09/14/19 0521  BP: 106/60 103/70  Pulse: 64 (!) 57  Resp: 16 16  Temp: 98.2 F (36.8 C) 98.1 F (36.7 C)  SpO2: 100% 100%    Dorsiflexion/Plantar flexion intact Incision: dressing C/D/I No cellulitis present Compartment soft  LABS Recent Labs    09/14/19 0327  HGB 10.1*  HCT 32.0*  WBC 11.0*  PLT 216    Recent Labs    09/14/19 0327  NA 140  K 4.8  BUN 27*  CREATININE 1.06  GLUCOSE 111*     Assessment/Plan: 1 Day Post-Op Procedure(s) (LRB): TOTAL KNEE ARTHROPLASTY (Right) Foley cath d/c'ed Advance diet Up with therapy D/C IV fluids Discharge home Follow up in 2 weeks at [facility] Follow up with OLIN,Markesia Crilly D in 2 weeks.  Contact information:  [facility] [address] [id number]    Overweight (BMI 25-29.9) Estimated body mass index is 29.54 kg/m as calculated from the following:   Height as of this encounter: 6\' 2"  (1.88 m).   Weight as of this encounter: 104.4 kg. Patient also counseled that weight may inhibit the healing process Patient counseled that  losing weight will help with future health issues        Danae Orleans PA-C  [place] [facility] now [facility]  [place] [address], [address], [place], [place] [address] Phone: [phone] [email or web address] Facebook  [employer]

## 2019-09-14 NOTE — Progress Notes (Signed)
Physical Therapy Treatment Patient Details Name: Brandon Marks MRN: [id number] DOB: 06-30-1956 Today's Date: 09/14/2019    History of Present Illness Patient is 64 y.o. 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    PT Comments    Pt reports R LE weakness and eager to attempt ambulation.  Pt ambulated short distance however limited by "weakness" and mild dizziness.  Pt also stopped during gait and stated, "Am I shaking? I feel like I'm shaking a little."  No shakiness observed.  Pt does not feel ready for d/c home today.    Follow Up Recommendations  Follow surgeon's recommendation for DC plan and follow-up therapies     Equipment Recommendations  None recommended by PT    Recommendations for Other Services       Precautions / Restrictions Precautions Precautions: Fall;Knee Restrictions RLE Weight Bearing: Weight bearing as tolerated    Mobility  Bed Mobility   Bed Mobility: Sit to Supine     Supine to sit: Supervision;HOB elevated     General bed mobility comments: pt in recliner  Transfers Overall transfer level: Needs assistance Equipment used: Rolling walker (2 wheeled) Transfers: Sit to/from Stand Sit to Stand: Min guard Stand pivot transfers: Min guard       General transfer comment: verbal cues for safe technique  Ambulation/Gait Ambulation/Gait assistance: Min guard;+2 safety/equipment(recliner following for safety) Gait Distance (Feet): 50 Feet Assistive device: Rolling walker (2 wheeled) Gait Pattern/deviations: Step-to pattern;Decreased stance time - right;Antalgic;Trunk flexed     General Gait Details: verbal cues for sequence and posture, RW positioning; pt reports mild dizziness and mostly "R LE weakness"  BP 130/76 mmHg upon return to Dentist    Modified Rankin (Stroke Patients Only)       Balance                                             Cognition Arousal/Alertness: Awake/alert Behavior During Therapy: WFL for tasks assessed/performed Overall Cognitive Status: Within Functional Limits for tasks assessed                                 General Comments: ?decreased safety awareness; attempting to get OOB without help and stating he is dizzy      Exercises Total Joint Exercises Ankle Circles/Pumps: AROM;Both;10 reps Quad Sets: AROM;Right;10 reps Heel Slides: AAROM;10 reps;Right;Seated Straight Leg Raises: Supine;Right;AROM;10 reps Goniometric ROM: approx -5- 90* AAROM right knee    General Comments        Pertinent Vitals/Pain Pain Assessment: 0-10 Pain Score: 5  Pain Location: right knee Pain Descriptors / Indicators: Sore;Aching Pain Intervention(s): Repositioned;Monitored during session;Ice applied    Home Living                      Prior Function            PT Goals (current goals can now be found in the care plan section) Progress towards PT goals: Progressing toward goals    Frequency    7X/week      PT Plan Current plan remains appropriate    Co-evaluation  AM-PAC PT "6 Clicks" Mobility   Outcome Measure  Help needed turning from your back to your side while in a flat bed without using bedrails?: A Little Help needed moving from lying on your back to sitting on the side of a flat bed without using bedrails?: A Little Help needed moving to and from a bed to a chair (including a wheelchair)?: A Little Help needed standing up from a chair using your arms (e.g., wheelchair or bedside chair)?: A Little Help needed to walk in hospital room?: A Little Help needed climbing 3-5 steps with a railing? : A Lot 6 Click Score: 17    End of Session Equipment Utilized During Treatment: Gait belt Activity Tolerance: Patient tolerated treatment well Patient left: in chair;with chair alarm set;with call  bell/phone within reach Nurse Communication: Mobility status PT Visit Diagnosis: Muscle weakness (generalized) (M62.81);Difficulty in walking, not elsewhere classified (R26.2)     Time: [id number] PT Time Calculation (min) (ACUTE ONLY): 13 min  Charges:  $Gait Training: 8-22 mins                      Arlyce Dice, DPT Acute Rehabilitation Services Office: [phone]  Trena Platt 09/14/2019, 3:47 PM

## 2019-09-14 NOTE — Progress Notes (Signed)
Physical Therapy Treatment Patient Details Name: Brandon Marks MRN: [id number] DOB: [birth year] Today's Date: 09/14/2019    History of Present Illness Patient is 64 y.o. 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    PT Comments    Pt reports still feeling very dizzy with any mobility at this time.  Pt agreeable to OOB to recliner and performed a couple exercises.     Follow Up Recommendations  Follow surgeon's recommendation for DC plan and follow-up therapies     Equipment Recommendations  None recommended by PT    Recommendations for Other Services       Precautions / Restrictions Precautions Precautions: Fall;Knee Restrictions RLE Weight Bearing: Weight bearing as tolerated    Mobility  Bed Mobility   Bed Mobility: Sit to Supine     Supine to sit: Supervision;HOB elevated     General bed mobility comments: pt self assisted R LE, reports dizziness with sitting, BP 114/59 supine and then 129/111 with sitting  Transfers Overall transfer level: Needs assistance Equipment used: Rolling walker (2 wheeled) Transfers: Sit to/from [facility] Sit to Stand: Min guard Stand pivot transfers: Min guard       General transfer comment: dizziness remained so only assisted OOB to recliner, verbal cues for safe technique  Ambulation/Gait                 Stairs             Wheelchair Mobility    Modified Rankin (Stroke Patients Only)       Balance                                            Cognition Arousal/Alertness: Awake/alert Behavior During Therapy: WFL for tasks assessed/performed Overall Cognitive Status: Within Functional Limits for tasks assessed                                 General Comments: ?decreased safety awareness; attempting to get OOB without help and stating he is dizzy      Exercises Total Joint  Exercises Ankle Circles/Pumps: AROM;Both;10 reps Quad Sets: AROM;Right;10 reps Heel Slides: AAROM;Supine;10 reps;Right Straight Leg Raises: Supine;Right;AROM;10 reps Goniometric ROM: approx -5- 90* AAROM right knee    General Comments        Pertinent Vitals/Pain Pain Assessment: 0-10 Pain Score: 5  Pain Location: right knee Pain Descriptors / Indicators: Sore;Aching Pain Intervention(s): Monitored during session;Repositioned    Home Living                      Prior Function            PT Goals (current goals can now be found in the care plan section) Progress towards PT goals: Progressing toward goals    Frequency    7X/week      PT Plan Current plan remains appropriate    Co-evaluation              AM-PAC PT "6 Clicks" Mobility   Outcome Measure  Help needed turning from your back to your side while in a flat bed without using bedrails?: A Little Help needed moving from lying on your back to sitting on the side of a flat bed  without using bedrails?: A Little Help needed moving to and from a bed to a chair (including a wheelchair)?: A Little Help needed standing up from a chair using your arms (Marks.g., wheelchair or bedside chair)?: A Little Help needed to walk in hospital room?: A Lot Help needed climbing 3-5 steps with a railing? : A Lot 6 Click Score: 16    End of Session Equipment Utilized During Treatment: Gait belt Activity Tolerance: Other (comment)(dizziness) Patient left: in chair;with call bell/phone within reach;with chair alarm set Nurse Communication: Mobility status PT Visit Diagnosis: Muscle weakness (generalized) (M62.81);Difficulty in walking, not elsewhere classified (R26.2)     Time: [id number] PT Time Calculation (min) (ACUTE ONLY): 18 min  Charges:  $Therapeutic Activity: 8-22 mins                     Arlyce Dice, DPT Acute Rehabilitation Services Office: [phone]   Brandon Marks,Brandon Marks 09/14/2019, 2:01 PM

## 2019-09-15 ENCOUNTER — Encounter: Payer: Self-pay | Admitting: *Deleted

## 2019-09-15 DIAGNOSIS — M1711 Unilateral primary osteoarthritis, right knee: Secondary | ICD-10-CM | POA: Diagnosis not present

## 2019-09-15 DIAGNOSIS — Z96652 Presence of left artificial knee joint: Secondary | ICD-10-CM | POA: Diagnosis not present

## 2019-09-15 DIAGNOSIS — G4733 Obstructive sleep apnea (adult) (pediatric): Secondary | ICD-10-CM | POA: Diagnosis not present

## 2019-09-15 DIAGNOSIS — Z9884 Bariatric surgery status: Secondary | ICD-10-CM | POA: Diagnosis not present

## 2019-09-15 DIAGNOSIS — I110 Hypertensive heart disease with heart failure: Secondary | ICD-10-CM | POA: Diagnosis not present

## 2019-09-15 DIAGNOSIS — I509 Heart failure, unspecified: Secondary | ICD-10-CM | POA: Diagnosis not present

## 2019-09-15 DIAGNOSIS — Z96653 Presence of artificial knee joint, bilateral: Secondary | ICD-10-CM | POA: Diagnosis not present

## 2019-09-15 DIAGNOSIS — E7800 Pure hypercholesterolemia, unspecified: Secondary | ICD-10-CM | POA: Diagnosis not present

## 2019-09-15 LAB — CBC
HCT: 31.7 % — ABNORMAL LOW (ref 39.0–52.0)
Hemoglobin: 10.2 g/dL — ABNORMAL LOW (ref 13.0–17.0)
MCH: 30.9 pg (ref 26.0–34.0)
MCHC: 32.2 g/dL (ref 30.0–36.0)
MCV: 96.1 fL (ref 80.0–100.0)
Platelets: 210 10*3/uL (ref 150–400)
RBC: 3.3 MIL/uL — ABNORMAL LOW (ref 4.22–5.81)
RDW: 13.4 % (ref 11.5–15.5)
WBC: 9.7 10*3/uL (ref 4.0–10.5)
nRBC: 0 % (ref 0.0–0.2)

## 2019-09-15 LAB — BASIC METABOLIC PANEL
Anion gap: 8 (ref 5–15)
BUN: 23 mg/dL (ref 8–23)
CO2: 25 mmol/L (ref 22–32)
Calcium: 8.6 mg/dL — ABNORMAL LOW (ref 8.9–10.3)
Chloride: 104 mmol/L (ref 98–111)
Creatinine, Ser: 0.71 mg/dL (ref 0.61–1.24)
GFR calc Af Amer: >60 mL/min (ref >60)
GFR calc non Af Amer: >60 mL/min (ref >60)
Glucose, Bld: 84 mg/dL (ref 70–99)
Potassium: 4.2 mmol/L (ref 3.5–5.1)
Sodium: 137 mmol/L (ref 135–145)

## 2019-09-15 NOTE — Progress Notes (Signed)
Physical Therapy Treatment Patient Details Name: Brandon Marks MRN: [id number] DOB: [birth year] Today's Date: 09/15/2019    History of Present Illness Patient is 64 y.o. 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    PT Comments    Pt reports mobility much easier after L TKA, and he reports more pain with this surgery.  Pt states he doesn't want to go home (since his mother and aunt cannot assist him) however mobilizing without physical assist.  After ambulating and trying steps on 2nd attempt, pt feeling more confident.  Pt states he "hired a girl" to assist him with getting home.  Pt aware to utilize gait belt for safety.  Pt plans to have HHPT upon d/c.    Follow Up Recommendations  Follow surgeon's recommendation for DC plan and follow-up therapies;Home health PT     Equipment Recommendations  None recommended by PT    Recommendations for Other Services       Precautions / Restrictions Precautions Precautions: Fall;Knee Restrictions RLE Weight Bearing: Weight bearing as tolerated    Mobility  Bed Mobility Overal bed mobility: Needs Assistance Bed Mobility: Supine to Sit;Sit to Supine     Supine to sit: Supervision;HOB elevated Sit to supine: Supervision;HOB elevated      Transfers Overall transfer level: Needs assistance Equipment used: Rolling walker (2 wheeled) Transfers: Sit to/from Stand Sit to Stand: Supervision         General transfer comment: verbal cues for safe technique  Ambulation/Gait Ambulation/Gait assistance: Min guard;Supervision Gait Distance (Feet): 40 Feet Assistive device: Rolling walker (2 wheeled) Gait Pattern/deviations: Step-to pattern;Decreased stance time - right;Antalgic;Trunk flexed     General Gait Details: verbal cues for sequence and posture, RW positioning; cues for slowing pace for safety   Stairs Stairs: Yes Stairs assistance: Min guard Stair  Management: One rail Left;Step to pattern;Forwards;With walker Number of Stairs: 3 General stair comments: verbal cues for sequence and safety; pt performed with 2 rails despite cues for performing as if he were at home (only has left rail)  pt did not want to use RW and perform backwards so utilized L hand rail which pt has at home and positioned RW at top of steps and pt performance improved second attempt   Wheelchair Mobility    Modified Rankin (Stroke Patients Only)       Balance                                            Cognition Arousal/Alertness: Awake/alert Behavior During Therapy: WFL for tasks assessed/performed Overall Cognitive Status: Within Functional Limits for tasks assessed                                        Exercises Total Joint Exercises Quad Sets: AROM;Right;10 reps Heel Slides: AAROM;10 reps;Right;Seated Long Arc Quad: AROM;Right;5 reps;Seated    General Comments        Pertinent Vitals/Pain Pain Assessment: 0-10 Pain Score: 5  Pain Location: right thigh, knee Pain Descriptors / Indicators: Sore;Aching Pain Intervention(s): Monitored during session;Repositioned;Heat applied;Ice applied(pt aware heat to thigh only and not over incision)    Home Living  Prior Function            PT Goals (current goals can now be found in the care plan section) Progress towards PT goals: Progressing toward goals    Frequency    7X/week      PT Plan Current plan remains appropriate    Co-evaluation              AM-PAC PT "6 Clicks" Mobility   Outcome Measure  Help needed turning from your back to your side while in a flat bed without using bedrails?: A Little Help needed moving from lying on your back to sitting on the side of a flat bed without using bedrails?: A Little Help needed moving to and from a bed to a chair (including a wheelchair)?: A Little Help needed standing  up from a chair using your arms (e.g., wheelchair or bedside chair)?: A Little Help needed to walk in hospital room?: A Little Help needed climbing 3-5 steps with a railing? : A Little 6 Click Score: 18    End of Session Equipment Utilized During Treatment: Gait belt Activity Tolerance: Patient tolerated treatment well Patient left: in bed;with call bell/phone within reach Nurse Communication: Mobility status PT Visit Diagnosis: Muscle weakness (generalized) (M62.81);Difficulty in walking, not elsewhere classified (R26.2)     Time: [id number] PT Time Calculation (min) (ACUTE ONLY): 20 min  Charges:  $Gait Training: 8-22 mins                     Arlyce Dice, DPT Acute Rehabilitation Services Office: [phone]  Trena Platt 09/15/2019, 3:26 PM

## 2019-09-15 NOTE — Progress Notes (Signed)
Physical Therapy Treatment Patient Details Name: Brandon Marks MRN: [id number] DOB: 02-12-1956 Today's Date: 09/15/2019    History of Present Illness Patient is 64 y.o. 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    PT Comments    Pt assisted with ambulating in hallway.  Pt able to tolerate improved distance and was about to attempt stairs and reported he needed to wait until later.  Will return to practice safe stair technique prior to d/c.    Follow Up Recommendations  Follow surgeon's recommendation for DC plan and follow-up therapies;Home health PT     Equipment Recommendations  None recommended by PT    Recommendations for Other Services       Precautions / Restrictions Precautions Precautions: Fall;Knee Restrictions RLE Weight Bearing: Weight bearing as tolerated    Mobility  Bed Mobility Overal bed mobility: Needs Assistance Bed Mobility: Supine to Sit     Supine to sit: Supervision;HOB elevated Sit to supine: Supervision;HOB elevated      Transfers Overall transfer level: Needs assistance Equipment used: Rolling walker (2 wheeled) Transfers: Sit to/from Stand Sit to Stand: Min guard;From elevated surface         General transfer comment: verbal cues for safe technique  Ambulation/Gait Ambulation/Gait assistance: Min guard Gait Distance (Feet): 100 Feet Assistive device: Rolling walker (2 wheeled) Gait Pattern/deviations: Step-to pattern;Decreased stance time - right;Antalgic;Trunk flexed     General Gait Details: verbal cues for sequence and posture, RW positioning; cues for slowing pace for safety   Stairs             Wheelchair Mobility    Modified Rankin (Stroke Patients Only)       Balance                                            Cognition Arousal/Alertness: Awake/alert Behavior During Therapy: WFL for tasks  assessed/performed Overall Cognitive Status: Within Functional Limits for tasks assessed                                        Exercises Total Joint Exercises Quad Sets: AROM;Right;10 reps Heel Slides: AAROM;10 reps;Right;Seated Long Arc Quad: AROM;Right;5 reps;Seated    General Comments        Pertinent Vitals/Pain Pain Assessment: 0-10 Pain Score: 5  Pain Location: right thigh, knee Pain Descriptors / Indicators: Sore;Aching Pain Intervention(s): Monitored during session;Repositioned;Heat applied;Ice applied(pt aware heat to thigh only and not over incision)    Home Living                      Prior Function            PT Goals (current goals can now be found in the care plan section) Progress towards PT goals: Progressing toward goals    Frequency           PT Plan Current plan remains appropriate    Co-evaluation              AM-PAC PT "6 Clicks" Mobility   Outcome Measure  Help needed turning from your back to your side while in a flat bed without using bedrails?: A Little Help needed moving from lying on your back  to sitting on the side of a flat bed without using bedrails?: A Little Help needed moving to and from a bed to a chair (including a wheelchair)?: A Little Help needed standing up from a chair using your arms (e.g., wheelchair or bedside chair)?: A Little Help needed to walk in hospital room?: A Little Help needed climbing 3-5 steps with a railing? : A Lot 6 Click Score: 17    End of Session Equipment Utilized During Treatment: Gait belt Activity Tolerance: Patient tolerated treatment well Patient left: in bed;with call bell/phone within reach Nurse Communication: Mobility status PT Visit Diagnosis: Muscle weakness (generalized) (M62.81);Difficulty in walking, not elsewhere classified (R26.2)     Time: 1207-1225 PT Time Calculation (min) (ACUTE ONLY): 18 min  Charges:  $Gait Training: 8-22 mins                     Arlyce Dice, DPT Acute Rehabilitation Services Office: [phone]  York Ram E 09/15/2019, 3:20 PM

## 2019-09-15 NOTE — Progress Notes (Signed)
     Subjective: 2 Days Post-Op Procedure(s) (LRB): TOTAL KNEE ARTHROPLASTY (Right)   Patient reports pain as mild, pain controlled. No reported events throughout the night.  Feels that he didn't do well with PT yesterday, difficulty with the right leg holding him up.  Discussed the weakness in the operative leg as well as the other leg, since it is still in the recovery phase of surgery.  States that he is looking forward to getting home. Patient ready to be discharged home.     Objective:   VITALS:   Vitals:   09/14/19 2159 09/15/19 0542  BP: 110/84 116/78  Pulse: 68 60  Resp: 14 18  Temp: 98 F (36.7 C) 98.5 F (36.9 C)  SpO2: 99% 99%    Dorsiflexion/Plantar flexion intact Incision: dressing C/D/I No cellulitis present Compartment soft  LABS Recent Labs    09/14/19 0327 09/15/19 0324  HGB 10.1* 10.2*  HCT 32.0* 31.7*  WBC 11.0* 9.7  PLT 216 210    Recent Labs    09/14/19 0327 09/15/19 0324  NA 140 137  K 4.8 4.2  BUN 27* 23  CREATININE 1.06 0.71  GLUCOSE 111* 84     Assessment/Plan: 2 Days Post-Op Procedure(s) (LRB): TOTAL KNEE ARTHROPLASTY (Right) Up with therapy Discharge home with home health  Follow up in 2 weeks at [facility] Follow up with OLIN,Klohe Lovering D in 2 weeks.  Contact information:  [facility] [address] [place] [id number]            Danae Orleans PA-C  [place] [facility] now [facility]  [place] [address], [place], [place], [place] [address] Phone: [phone] [email or web address] Facebook  [employer]

## 2019-09-15 NOTE — Progress Notes (Signed)
Discharge and medication instructions reviewed with patient. Questions answered and patient denies further questions. No prescriptions given. Spouse is en route to drive patient home. Donne Hazel, RN

## 2019-09-16 ENCOUNTER — Telehealth: Payer: Self-pay

## 2019-09-16 NOTE — Telephone Encounter (Signed)
Called patient at [phone] Unable to reach patient/ Left voice  message to call back. Contact and phone number provided.

## 2019-09-17 DIAGNOSIS — M5030 Other cervical disc degeneration, unspecified cervical region: Secondary | ICD-10-CM | POA: Diagnosis not present

## 2019-09-17 DIAGNOSIS — I110 Hypertensive heart disease with heart failure: Secondary | ICD-10-CM | POA: Diagnosis not present

## 2019-09-17 DIAGNOSIS — E538 Deficiency of other specified B group vitamins: Secondary | ICD-10-CM | POA: Diagnosis not present

## 2019-09-17 DIAGNOSIS — I491 Atrial premature depolarization: Secondary | ICD-10-CM | POA: Diagnosis not present

## 2019-09-17 DIAGNOSIS — Z471 Aftercare following joint replacement surgery: Secondary | ICD-10-CM | POA: Diagnosis not present

## 2019-09-17 DIAGNOSIS — D649 Anemia, unspecified: Secondary | ICD-10-CM | POA: Diagnosis not present

## 2019-09-17 DIAGNOSIS — I5030 Unspecified diastolic (congestive) heart failure: Secondary | ICD-10-CM | POA: Diagnosis not present

## 2019-09-17 DIAGNOSIS — E785 Hyperlipidemia, unspecified: Secondary | ICD-10-CM | POA: Diagnosis not present

## 2019-09-17 DIAGNOSIS — G4733 Obstructive sleep apnea (adult) (pediatric): Secondary | ICD-10-CM | POA: Diagnosis not present

## 2019-09-19 ENCOUNTER — Telehealth: Payer: Self-pay

## 2019-09-19 DIAGNOSIS — Z471 Aftercare following joint replacement surgery: Secondary | ICD-10-CM | POA: Diagnosis not present

## 2019-09-19 DIAGNOSIS — D649 Anemia, unspecified: Secondary | ICD-10-CM | POA: Diagnosis not present

## 2019-09-19 DIAGNOSIS — I5030 Unspecified diastolic (congestive) heart failure: Secondary | ICD-10-CM | POA: Diagnosis not present

## 2019-09-19 DIAGNOSIS — E538 Deficiency of other specified B group vitamins: Secondary | ICD-10-CM | POA: Diagnosis not present

## 2019-09-19 DIAGNOSIS — M5030 Other cervical disc degeneration, unspecified cervical region: Secondary | ICD-10-CM | POA: Diagnosis not present

## 2019-09-19 DIAGNOSIS — I491 Atrial premature depolarization: Secondary | ICD-10-CM | POA: Diagnosis not present

## 2019-09-19 DIAGNOSIS — E785 Hyperlipidemia, unspecified: Secondary | ICD-10-CM | POA: Diagnosis not present

## 2019-09-19 DIAGNOSIS — I110 Hypertensive heart disease with heart failure: Secondary | ICD-10-CM | POA: Diagnosis not present

## 2019-09-19 DIAGNOSIS — G4733 Obstructive sleep apnea (adult) (pediatric): Secondary | ICD-10-CM | POA: Diagnosis not present

## 2019-09-19 NOTE — Telephone Encounter (Signed)
Transition Care Management Hospital F/U  Follow-up Telephone Call  Date of discharge and from where: 09/15/2019 from [facility]  How have you been since you were released from the hospital? Felling sore but everything OK  Any questions or concerns? NONE   Items Reviewed:  Did the pt receive and understand the discharge instructions provided?  YES  Medications obtained and verified?  Medication verified/ stated he did not pick up from pharmacy but he still have from previous prescription Hydrocodone-acetaminophen taking 1/2 pill when in pain   Any new allergies since your discharge? None  Dietary orders reviewed? YES    Do you have support at home?  NONE  Functional Questionnaire: (I = Independent and D = Dependent) ADLs: I  Bathing/Dressing- I   Meal Prep- I  Eating- I  Maintaining continence-I / Transferring/Ambulation-I  Managing Meds- I  Follow up appointments reviewed:   PCP Hospital f/u appt confirmed? Scheduled to see Dr Wynetta Emery on  10/04/2019@ 1:50 pm  Specialist Hospital f/u appt confirmed?    Not scheduled yet  Are transportation arrangements needed?  No /    If their condition worsens, is the pt aware to call PCP or go to the Emergency Dept.? YES    Was the patient provided with contact information for the PCP's office or ED? YES   Was to pt encouraged to call back with questions or concerns? YES   OTHER   Pt stated having a lift chair and walker at home to assist with ambulation. Informed pt to call Center if questions or concerns, or any help needed. Verbalized understanding

## 2019-09-21 DIAGNOSIS — G4733 Obstructive sleep apnea (adult) (pediatric): Secondary | ICD-10-CM | POA: Diagnosis not present

## 2019-09-21 DIAGNOSIS — I110 Hypertensive heart disease with heart failure: Secondary | ICD-10-CM | POA: Diagnosis not present

## 2019-09-21 DIAGNOSIS — D649 Anemia, unspecified: Secondary | ICD-10-CM | POA: Diagnosis not present

## 2019-09-21 DIAGNOSIS — I5030 Unspecified diastolic (congestive) heart failure: Secondary | ICD-10-CM | POA: Diagnosis not present

## 2019-09-21 DIAGNOSIS — E538 Deficiency of other specified B group vitamins: Secondary | ICD-10-CM | POA: Diagnosis not present

## 2019-09-21 DIAGNOSIS — M5030 Other cervical disc degeneration, unspecified cervical region: Secondary | ICD-10-CM | POA: Diagnosis not present

## 2019-09-21 DIAGNOSIS — Z471 Aftercare following joint replacement surgery: Secondary | ICD-10-CM | POA: Diagnosis not present

## 2019-09-21 DIAGNOSIS — I491 Atrial premature depolarization: Secondary | ICD-10-CM | POA: Diagnosis not present

## 2019-09-21 DIAGNOSIS — E785 Hyperlipidemia, unspecified: Secondary | ICD-10-CM | POA: Diagnosis not present

## 2019-09-21 NOTE — Discharge Summary (Signed)
Physician Discharge Summary  Patient ID: Brandon Marks MRN: [id number] DOB/AGE: 09/13/1955 64 y.o.  Admit date: 09/13/2019 Discharge date: 09/15/2019   Procedures:  Procedure(s) (LRB): TOTAL KNEE ARTHROPLASTY (Right)  Attending Physician:  Dr. Paralee Cancel   Admission Diagnoses:   Right knee primary OA / pain  Discharge Diagnoses:  Principal Problem:   S/P right TKA  Past Medical History:  Diagnosis Date  . Arthritis   . Herniated disc   . High cholesterol   . Hypertension   . Morbid obesity ([place])   . PAC (premature atrial contraction)   . Sleep apnea    non compliant with c-pap    HPI:    Brandon Marks, 64 y.o. male, has a history of pain and functional disability in the right knee due to arthritis and has failed non-surgical conservative treatments for greater than 12 weeks to includecorticosteriod injections, viscosupplementation injections and activity modification.  Onset of symptoms was gradual, starting 2 years ago with gradually worsening course since that time. The patient noted no past surgery on the right knee(s).  Patient currently rates pain in the right knee(s) at 7 out of 10 with activity. Patient has worsening of pain with activity and weight bearing and pain that interferes with activities of daily living.  Patient has evidence of joint space narrowing by imaging studies. There is no active infection.  PCP: Ladell Pier, MD   Discharged Condition: good  Hospital Course:  Patient underwent the above stated procedure on 09/13/2019. Patient tolerated the procedure well and brought to the recovery room in good condition and subsequently to the floor.  POD #1 BP: 103/70 ; Pulse: 57 ; Temp: 98.1 F (36.7 C) ; Resp: 16 Patient reports pain as moderate, pain controlled with medication.  Has not had any analgesic medications since last night.  No reported events throughout the night.  Discussed the procedure and expectations moving  forward. Dorsiflexion/plantar flexion intact, incision: dressing C/D/I, no cellulitis present and compartment soft.   LABS  Basename    HGB     10.1  HCT     32.0   POD #2  BP: 116/78 ; Pulse: 60 ; Temp: 98.5 F (36.9 C) ; Resp: 18 Patient reports pain as mild, pain controlled. No reported events throughout the night.  Feels that he didn't do well with PT yesterday, difficulty with the right leg holding him up.  Discussed the weakness in the operative leg as well as the other leg, since it is still in the recovery phase of surgery.  States that he is looking forward to getting home. Patient ready to be discharged home. Dorsiflexion/plantar flexion intact, incision: dressing C/D/I, no cellulitis present and compartment soft.   LABS  Basename    HGB     10.2  HCT     31.7    Discharge Exam: General appearance: alert, cooperative and no distress Extremities: Homans sign is negative, no sign of DVT, no edema, redness or tenderness in the calves or thighs and no ulcers, gangrene or trophic changes  Disposition: Home with follow up in 2 weeks   Follow-up Information    Paralee Cancel, MD. Schedule an appointment as soon as possible for a visit in 2 weeks.   Specialty: Orthopedic Surgery Contact information: [address] [place] [address] [id number]           Discharge Instructions    Call MD / Call 911   Complete by: As directed  If you experience chest pain or shortness of breath, CALL 911 and be transported to the hospital emergency room.  If you develope a fever above 101 F, pus (white drainage) or increased drainage or redness at the wound, or calf pain, call your surgeon's office.   Change dressing   Complete by: As directed    Maintain surgical dressing until follow up in the clinic. If the edges start to pull up, may reinforce with tape. If the dressing is no longer working, may remove and cover with gauze and tape, but must keep the area dry and  clean.  Call with any questions or concerns.   Constipation Prevention   Complete by: As directed    Drink plenty of fluids.  Prune juice may be helpful.  You may use a stool softener, such as Colace (over the counter) 100 mg twice a day.  Use MiraLax (over the counter) for constipation as needed.   Diet - low sodium heart healthy   Complete by: As directed    Discharge instructions   Complete by: As directed    Maintain surgical dressing until follow up in the clinic. If the edges start to pull up, may reinforce with tape. If the dressing is no longer working, may remove and cover with gauze and tape, but must keep the area dry and clean.  Follow up in 2 weeks at [facility]. Call with any questions or concerns.   Increase activity slowly as tolerated   Complete by: As directed    Weight bearing as tolerated with assist device (walker, cane, etc) as directed, use it as long as suggested by your surgeon or therapist, typically at least 4-6 weeks.   TED hose   Complete by: As directed    Use stockings (TED hose) for 2 weeks on both leg(s).  You may remove them at night for sleeping.      Allergies as of 09/15/2019      Reactions   Codeine Nausea And Vomiting   Oxycodone Other (See Comments)   Upset GI      Medication List    STOP taking these medications   traMADol 50 MG tablet Commonly known as: Ultram     TAKE these medications   aspirin 81 MG chewable tablet Commonly known as: Aspirin Childrens Chew 1 tablet (81 mg total) by mouth 2 (two) times daily. Take for 4 weeks, then resume regular dose.   docusate sodium 100 MG capsule Commonly known as: Colace Take 1 capsule (100 mg total) by mouth 2 (two) times daily.   ferrous sulfate 325 (65 FE) MG tablet Commonly known as: FerrouSul Take 1 tablet (325 mg total) by mouth 3 (three) times daily with meals for 14 days.   HYDROcodone-acetaminophen 5-325 MG tablet Commonly known as: Norco Take 1-2 tablets by mouth every 6 (six)  hours as needed for moderate pain or severe pain. What changed:   when to take this  reasons to take this   levocetirizine 5 MG tablet Commonly known as: XYZAL Take 5 mg by mouth daily.   lisinopril-hydrochlorothiazide 20-25 MG tablet Commonly known as: ZESTORETIC Take 1 tablet by mouth 3 (three) times a week.   lovastatin 40 MG tablet Commonly known as: MEVACOR Take 0.5 tablets (20 mg total) by mouth at bedtime. What changed: when to take this   methocarbamol 500 MG tablet Commonly known as: Robaxin Take 1 tablet (500 mg total) by mouth every 6 (six) hours as needed for muscle spasms.  ondansetron 4 MG tablet Commonly known as: Zofran Take 1 tablet (4 mg total) by mouth every 8 (eight) hours as needed for nausea or vomiting.   polyethylene glycol 17 g packet Commonly known as: MIRALAX / GLYCOLAX Take 17 g by mouth 2 (two) times daily.            Discharge Care Instructions  (From admission, onward)         Start     Ordered   09/14/19 0000  Change dressing    Comments: Maintain surgical dressing until follow up in the clinic. If the edges start to pull up, may reinforce with tape. If the dressing is no longer working, may remove and cover with gauze and tape, but must keep the area dry and clean.  Call with any questions or concerns.   09/14/19 [id number]           Signed: [place] Pugh. Dontay Harm   PA-C  09/21/2019, 9:27 PM

## 2019-09-22 DIAGNOSIS — D649 Anemia, unspecified: Secondary | ICD-10-CM | POA: Diagnosis not present

## 2019-09-22 DIAGNOSIS — G4733 Obstructive sleep apnea (adult) (pediatric): Secondary | ICD-10-CM | POA: Diagnosis not present

## 2019-09-22 DIAGNOSIS — E785 Hyperlipidemia, unspecified: Secondary | ICD-10-CM | POA: Diagnosis not present

## 2019-09-22 DIAGNOSIS — I5030 Unspecified diastolic (congestive) heart failure: Secondary | ICD-10-CM | POA: Diagnosis not present

## 2019-09-22 DIAGNOSIS — I110 Hypertensive heart disease with heart failure: Secondary | ICD-10-CM | POA: Diagnosis not present

## 2019-09-22 DIAGNOSIS — E538 Deficiency of other specified B group vitamins: Secondary | ICD-10-CM | POA: Diagnosis not present

## 2019-09-22 DIAGNOSIS — M5030 Other cervical disc degeneration, unspecified cervical region: Secondary | ICD-10-CM | POA: Diagnosis not present

## 2019-09-22 DIAGNOSIS — I491 Atrial premature depolarization: Secondary | ICD-10-CM | POA: Diagnosis not present

## 2019-09-22 DIAGNOSIS — Z471 Aftercare following joint replacement surgery: Secondary | ICD-10-CM | POA: Diagnosis not present

## 2019-09-23 DIAGNOSIS — Z471 Aftercare following joint replacement surgery: Secondary | ICD-10-CM | POA: Diagnosis not present

## 2019-09-23 DIAGNOSIS — Z96652 Presence of left artificial knee joint: Secondary | ICD-10-CM | POA: Diagnosis not present

## 2019-09-26 DIAGNOSIS — I110 Hypertensive heart disease with heart failure: Secondary | ICD-10-CM | POA: Diagnosis not present

## 2019-09-26 DIAGNOSIS — D649 Anemia, unspecified: Secondary | ICD-10-CM | POA: Diagnosis not present

## 2019-09-26 DIAGNOSIS — E785 Hyperlipidemia, unspecified: Secondary | ICD-10-CM | POA: Diagnosis not present

## 2019-09-26 DIAGNOSIS — I491 Atrial premature depolarization: Secondary | ICD-10-CM | POA: Diagnosis not present

## 2019-09-26 DIAGNOSIS — Z471 Aftercare following joint replacement surgery: Secondary | ICD-10-CM | POA: Diagnosis not present

## 2019-09-26 DIAGNOSIS — G4733 Obstructive sleep apnea (adult) (pediatric): Secondary | ICD-10-CM | POA: Diagnosis not present

## 2019-09-26 DIAGNOSIS — I5030 Unspecified diastolic (congestive) heart failure: Secondary | ICD-10-CM | POA: Diagnosis not present

## 2019-09-26 DIAGNOSIS — E538 Deficiency of other specified B group vitamins: Secondary | ICD-10-CM | POA: Diagnosis not present

## 2019-09-26 DIAGNOSIS — M5030 Other cervical disc degeneration, unspecified cervical region: Secondary | ICD-10-CM | POA: Diagnosis not present

## 2019-09-29 DIAGNOSIS — I5030 Unspecified diastolic (congestive) heart failure: Secondary | ICD-10-CM | POA: Diagnosis not present

## 2019-09-29 DIAGNOSIS — I110 Hypertensive heart disease with heart failure: Secondary | ICD-10-CM | POA: Diagnosis not present

## 2019-09-29 DIAGNOSIS — Z471 Aftercare following joint replacement surgery: Secondary | ICD-10-CM | POA: Diagnosis not present

## 2019-09-29 DIAGNOSIS — D649 Anemia, unspecified: Secondary | ICD-10-CM | POA: Diagnosis not present

## 2019-09-29 DIAGNOSIS — M5030 Other cervical disc degeneration, unspecified cervical region: Secondary | ICD-10-CM | POA: Diagnosis not present

## 2019-09-29 DIAGNOSIS — E785 Hyperlipidemia, unspecified: Secondary | ICD-10-CM | POA: Diagnosis not present

## 2019-09-29 DIAGNOSIS — E538 Deficiency of other specified B group vitamins: Secondary | ICD-10-CM | POA: Diagnosis not present

## 2019-09-29 DIAGNOSIS — G4733 Obstructive sleep apnea (adult) (pediatric): Secondary | ICD-10-CM | POA: Diagnosis not present

## 2019-09-29 DIAGNOSIS — I491 Atrial premature depolarization: Secondary | ICD-10-CM | POA: Diagnosis not present

## 2019-10-03 DIAGNOSIS — I5030 Unspecified diastolic (congestive) heart failure: Secondary | ICD-10-CM | POA: Diagnosis not present

## 2019-10-03 DIAGNOSIS — M5030 Other cervical disc degeneration, unspecified cervical region: Secondary | ICD-10-CM | POA: Diagnosis not present

## 2019-10-03 DIAGNOSIS — E538 Deficiency of other specified B group vitamins: Secondary | ICD-10-CM | POA: Diagnosis not present

## 2019-10-03 DIAGNOSIS — E785 Hyperlipidemia, unspecified: Secondary | ICD-10-CM | POA: Diagnosis not present

## 2019-10-03 DIAGNOSIS — D649 Anemia, unspecified: Secondary | ICD-10-CM | POA: Diagnosis not present

## 2019-10-03 DIAGNOSIS — Z471 Aftercare following joint replacement surgery: Secondary | ICD-10-CM | POA: Diagnosis not present

## 2019-10-03 DIAGNOSIS — G4733 Obstructive sleep apnea (adult) (pediatric): Secondary | ICD-10-CM | POA: Diagnosis not present

## 2019-10-03 DIAGNOSIS — I110 Hypertensive heart disease with heart failure: Secondary | ICD-10-CM | POA: Diagnosis not present

## 2019-10-03 DIAGNOSIS — I491 Atrial premature depolarization: Secondary | ICD-10-CM | POA: Diagnosis not present

## 2019-10-04 ENCOUNTER — Ambulatory Visit: Payer: Medicare HMO | Attending: Internal Medicine | Admitting: Internal Medicine

## 2019-10-04 ENCOUNTER — Other Ambulatory Visit: Payer: Self-pay

## 2019-10-04 DIAGNOSIS — Z96653 Presence of artificial knee joint, bilateral: Secondary | ICD-10-CM | POA: Diagnosis not present

## 2019-10-04 DIAGNOSIS — I5032 Chronic diastolic (congestive) heart failure: Secondary | ICD-10-CM

## 2019-10-04 DIAGNOSIS — I10 Essential (primary) hypertension: Secondary | ICD-10-CM

## 2019-10-04 DIAGNOSIS — H9191 Unspecified hearing loss, right ear: Secondary | ICD-10-CM | POA: Diagnosis not present

## 2019-10-04 DIAGNOSIS — G4733 Obstructive sleep apnea (adult) (pediatric): Secondary | ICD-10-CM

## 2019-10-04 NOTE — Progress Notes (Signed)
Virtual Visit via Telephone Note Due to current restrictions/limitations of in-office visits due to the COVID-19 pandemic, this scheduled clinical appointment was converted to a telehealth visit  I connected with Brandon Marks on 10/04/19 at 2:22 p.m by telephone and verified that I am speaking with the correct person using two identifiers. I am in my office.  The patient is at home.  Only the patient and myself participated in this encounter.  I discussed the limitations, risks, security and privacy concerns of performing an evaluation and management service by telephone and the availability of in person appointments. I also discussed with the patient that there may be a patient responsible charge related to this service. The patient expressed understanding and agreed to proceed.   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  Last seen 05/2019.  Purpose of today's visit is hospital follow-up.  Since last visit, he had TKR BL - left in 07/2019 and RT 08/2019.  P.T going well; sessions QOD.  He is planning on going to [facility] via car sometime next week.  He is on aspirin.  His orthopedic surgeon is not aware that he will be making this road trip.  HTN/diastolic CHF: Patient tells me that he is taking the lisinopril/HCTZ half a tablet every other day.  His blood pressure has been good.  The physical therapist checks his blood pressure at each session.  Last blood pressure reading was 118/80.  He is limiting salt in the foods.  Denies any chest pains or shortness of breath.  Complains of continued decreased hearing in the right ear.  I had referred him to ENT when I saw him in November for the same issue.  He had impacted cerumen.  However patient states he was not called.  He went on saw an audiologist on his own and hearing aid prescribed.  However he did not find the  hearing aid helpful.  He also wants to know what is the next step with his sleep study.  Had sleep study done 07/2019.  It revealed mild sleep apnea.  I submitted referral for him to get the titration study done but patient states he was not called.  However it was around the time when he was hospitalized for the first knee replacement surgery. Outpatient Encounter Medications as of 10/04/2019  Medication Sig  . aspirin (ASPIRIN CHILDRENS) 81 MG chewable tablet Chew 1 tablet (81 mg total) by mouth 2 (two) times daily. Take for 4 weeks, then resume regular dose.  . docusate sodium (COLACE) 100 MG capsule Take 1 capsule (100 mg total) by mouth 2 (two) times daily.  . ferrous sulfate (FERROUSUL) 325 (65 FE) MG tablet Take 1 tablet (325 mg total) by mouth 3 (three) times daily with meals for 14 days.  Marland Kitchen HYDROcodone-acetaminophen (NORCO) 5-325 MG tablet Take 1-2 tablets by mouth every 6 (six) hours as needed for moderate pain or severe pain.  Marland Kitchen levocetirizine (XYZAL) 5 MG tablet Take 5 mg by mouth daily.  Marland Kitchen lisinopril-hydrochlorothiazide (ZESTORETIC) 20-25 MG tablet Take 1 tablet by mouth 3 (three) times a week.   . lovastatin (MEVACOR) 40 MG tablet Take 0.5 tablets (20 mg total) by mouth at bedtime. (Patient taking differently: Take 20 mg by mouth 3 (three) times a week. )  . methocarbamol (ROBAXIN) 500 MG tablet Take 1 tablet (500 mg total) by mouth every 6 (six)  hours as needed for muscle spasms.  . ondansetron (ZOFRAN) 4 MG tablet Take 1 tablet (4 mg total) by mouth every 8 (eight) hours as needed for nausea or vomiting.  . polyethylene glycol (MIRALAX / GLYCOLAX) 17 g packet Take 17 g by mouth 2 (two) times daily.   No facility-administered encounter medications on file as of 10/04/2019.      Observations/Objective:   Assessment and Plan: 1. Status post bilateral knee replacements Patient reports that he is doing well.  I recommend that he check with his orthopedic specialist as to whether a long  road trip is advised this soon after knee replacement surgery as it can increase risk for blood clot  2. Essential hypertension Blood pressure control on lisinopril/HCTZ taking it every other day.  3. OSA (obstructive sleep apnea) - Cpap titration; Future  4. Decreased hearing of right ear - Ambulatory referral to ENT  5. Chronic diastolic heart failure (HCC) Stable and asymptomatic at this time.   Follow Up Instructions: F/u in 3 mths   I discussed the assessment and treatment plan with the patient. The patient was provided an opportunity to ask questions and all were answered. The patient agreed with the plan and demonstrated an understanding of the instructions.   The patient was advised to call back or seek an in-person evaluation if the symptoms worsen or if the condition fails to improve as anticipated.  I provided 11 minutes of non-face-to-face time during this encounter.   Karle Plumber, MD

## 2019-10-05 DIAGNOSIS — G4733 Obstructive sleep apnea (adult) (pediatric): Secondary | ICD-10-CM | POA: Diagnosis not present

## 2019-10-05 DIAGNOSIS — E538 Deficiency of other specified B group vitamins: Secondary | ICD-10-CM | POA: Diagnosis not present

## 2019-10-05 DIAGNOSIS — I110 Hypertensive heart disease with heart failure: Secondary | ICD-10-CM | POA: Diagnosis not present

## 2019-10-05 DIAGNOSIS — D649 Anemia, unspecified: Secondary | ICD-10-CM | POA: Diagnosis not present

## 2019-10-05 DIAGNOSIS — Z471 Aftercare following joint replacement surgery: Secondary | ICD-10-CM | POA: Diagnosis not present

## 2019-10-05 DIAGNOSIS — E785 Hyperlipidemia, unspecified: Secondary | ICD-10-CM | POA: Diagnosis not present

## 2019-10-05 DIAGNOSIS — I491 Atrial premature depolarization: Secondary | ICD-10-CM | POA: Diagnosis not present

## 2019-10-05 DIAGNOSIS — M5030 Other cervical disc degeneration, unspecified cervical region: Secondary | ICD-10-CM | POA: Diagnosis not present

## 2019-10-05 DIAGNOSIS — I5030 Unspecified diastolic (congestive) heart failure: Secondary | ICD-10-CM | POA: Diagnosis not present

## 2019-10-06 DIAGNOSIS — M5030 Other cervical disc degeneration, unspecified cervical region: Secondary | ICD-10-CM | POA: Diagnosis not present

## 2019-10-06 DIAGNOSIS — Z471 Aftercare following joint replacement surgery: Secondary | ICD-10-CM | POA: Diagnosis not present

## 2019-10-06 DIAGNOSIS — I491 Atrial premature depolarization: Secondary | ICD-10-CM | POA: Diagnosis not present

## 2019-10-06 DIAGNOSIS — I5030 Unspecified diastolic (congestive) heart failure: Secondary | ICD-10-CM | POA: Diagnosis not present

## 2019-10-06 DIAGNOSIS — I110 Hypertensive heart disease with heart failure: Secondary | ICD-10-CM | POA: Diagnosis not present

## 2019-10-06 DIAGNOSIS — G4733 Obstructive sleep apnea (adult) (pediatric): Secondary | ICD-10-CM | POA: Diagnosis not present

## 2019-10-06 DIAGNOSIS — E538 Deficiency of other specified B group vitamins: Secondary | ICD-10-CM | POA: Diagnosis not present

## 2019-10-06 DIAGNOSIS — E785 Hyperlipidemia, unspecified: Secondary | ICD-10-CM | POA: Diagnosis not present

## 2019-10-06 DIAGNOSIS — D649 Anemia, unspecified: Secondary | ICD-10-CM | POA: Diagnosis not present

## 2019-10-17 ENCOUNTER — Ambulatory Visit (INDEPENDENT_AMBULATORY_CARE_PROVIDER_SITE_OTHER): Payer: Medicare HMO | Admitting: Otolaryngology

## 2019-10-18 ENCOUNTER — Telehealth: Payer: Self-pay | Admitting: Internal Medicine

## 2019-10-18 NOTE — Telephone Encounter (Signed)
Coralyn Mark stated that the pcp office is responsible for doing prior auth due to the patient having [employer]. She requested for a call once complete to call patient for scheduling. Please follow up at your earliest convenience.   [phone]

## 2019-10-19 NOTE — Telephone Encounter (Signed)
Returned call to sleep study center and spoke to [facility] and made her aware that I received a call from [facility] regarding pt prior auth. Made Kia aware that I will need to know the date of the appointment because [facility] doesn't do future dates that it is wanting the exact date the procedure will be done. Per Kia she will contact pt and schedule and I will look in epic for appointment to do prior auth

## 2019-10-24 NOTE — Telephone Encounter (Signed)
Went on the [employer] [facility] website and did prior auth for sleep study   CPT- 95811 sleep study  ICD- G47.33 OSA   Authorization [id number] Effective- 11/10/19 Expires- 12/10/19

## 2019-10-26 DIAGNOSIS — Z96651 Presence of right artificial knee joint: Secondary | ICD-10-CM | POA: Diagnosis not present

## 2019-10-26 DIAGNOSIS — Z96652 Presence of left artificial knee joint: Secondary | ICD-10-CM | POA: Diagnosis not present

## 2019-10-26 DIAGNOSIS — Z471 Aftercare following joint replacement surgery: Secondary | ICD-10-CM | POA: Diagnosis not present

## 2019-10-26 DIAGNOSIS — Z96653 Presence of artificial knee joint, bilateral: Secondary | ICD-10-CM | POA: Diagnosis not present

## 2019-10-27 ENCOUNTER — Emergency Department: Payer: Medicare HMO

## 2019-10-27 ENCOUNTER — Encounter: Payer: Self-pay | Admitting: *Deleted

## 2019-10-27 ENCOUNTER — Other Ambulatory Visit: Payer: Self-pay

## 2019-10-27 ENCOUNTER — Emergency Department
Admission: EM | Admit: 2019-10-27 | Discharge: 2019-10-28 | Disposition: A | Discharge status: Home / Self Care | Payer: Medicare HMO | Attending: Emergency Medicine | Admitting: Emergency Medicine

## 2019-10-27 DIAGNOSIS — R519 Headache, unspecified: Secondary | ICD-10-CM | POA: Diagnosis not present

## 2019-10-27 DIAGNOSIS — D332 Benign neoplasm of brain, unspecified: Secondary | ICD-10-CM

## 2019-10-27 DIAGNOSIS — D432 Neoplasm of uncertain behavior of brain, unspecified: Secondary | ICD-10-CM | POA: Insufficient documentation

## 2019-10-27 DIAGNOSIS — I10 Essential (primary) hypertension: Secondary | ICD-10-CM | POA: Insufficient documentation

## 2019-10-27 DIAGNOSIS — Z9181 History of falling: Secondary | ICD-10-CM | POA: Diagnosis not present

## 2019-10-27 DIAGNOSIS — D3611 Benign neoplasm of peripheral nerves and autonomic nervous system of face, head, and neck: Secondary | ICD-10-CM

## 2019-10-27 DIAGNOSIS — H538 Other visual disturbances: Secondary | ICD-10-CM | POA: Insufficient documentation

## 2019-10-27 DIAGNOSIS — R202 Paresthesia of skin: Secondary | ICD-10-CM | POA: Insufficient documentation

## 2019-10-27 DIAGNOSIS — D333 Benign neoplasm of cranial nerves: Secondary | ICD-10-CM | POA: Diagnosis not present

## 2019-10-27 DIAGNOSIS — Z79899 Other long term (current) drug therapy: Secondary | ICD-10-CM | POA: Insufficient documentation

## 2019-10-27 DIAGNOSIS — C719 Malignant neoplasm of brain, unspecified: Secondary | ICD-10-CM | POA: Diagnosis not present

## 2019-10-27 LAB — CBC WITH DIFFERENTIAL/PLATELET
Abs Immature Granulocytes: 0.02 10*3/uL (ref 0.00–0.07)
Basophils Absolute: 0 10*3/uL (ref 0.0–0.1)
Basophils Relative: 1 %
Eosinophils Absolute: 0.1 10*3/uL (ref 0.0–0.5)
Eosinophils Relative: 1 %
HCT: 35.6 % — ABNORMAL LOW (ref 39.0–52.0)
Hemoglobin: 11.6 g/dL — ABNORMAL LOW (ref 13.0–17.0)
Immature Granulocytes: 0 %
Lymphocytes Relative: 22 %
Lymphs Abs: 1.6 10*3/uL (ref 0.7–4.0)
MCH: 30.3 pg (ref 26.0–34.0)
MCHC: 32.6 g/dL (ref 30.0–36.0)
MCV: 93 fL (ref 80.0–100.0)
Monocytes Absolute: 0.8 10*3/uL (ref 0.1–1.0)
Monocytes Relative: 11 %
Neutro Abs: 4.7 10*3/uL (ref 1.7–7.7)
Neutrophils Relative %: 65 %
Platelets: 270 10*3/uL (ref 150–400)
RBC: 3.83 MIL/uL — ABNORMAL LOW (ref 4.22–5.81)
RDW: 13.8 % (ref 11.5–15.5)
WBC: 7.2 10*3/uL (ref 4.0–10.5)
nRBC: 0 % (ref 0.0–0.2)

## 2019-10-27 LAB — COMPREHENSIVE METABOLIC PANEL
ALT: 10 U/L (ref 0–44)
AST: 12 U/L — ABNORMAL LOW (ref 15–41)
Albumin: 3.7 g/dL (ref 3.5–5.0)
Alkaline Phosphatase: 47 U/L (ref 38–126)
Anion gap: 7 (ref 5–15)
BUN: 15 mg/dL (ref 8–23)
CO2: 28 mmol/L (ref 22–32)
Calcium: 8.8 mg/dL — ABNORMAL LOW (ref 8.9–10.3)
Chloride: 103 mmol/L (ref 98–111)
Creatinine, Ser: 0.76 mg/dL (ref 0.61–1.24)
GFR calc Af Amer: >60 mL/min (ref >60)
GFR calc non Af Amer: >60 mL/min (ref >60)
Glucose, Bld: 85 mg/dL (ref 70–99)
Potassium: 3.6 mmol/L (ref 3.5–5.1)
Sodium: 138 mmol/L (ref 135–145)
Total Bilirubin: 0.6 mg/dL (ref 0.3–1.2)
Total Protein: 6.8 g/dL (ref 6.5–8.1)

## 2019-10-27 MED ORDER — GADOBUTROL 1 MMOL/ML IV SOLN
10.0000 mL | Freq: Once | INTRAVENOUS | Status: AC | PRN
  Administered 2019-10-27: 10 mL via INTRAVENOUS

## 2019-10-27 NOTE — ED Notes (Signed)
No blood work at this per dr Jimmye Norman.  Scan ordered only.

## 2019-10-27 NOTE — ED Triage Notes (Signed)
Pt has a headache and numbness around right eye area.  pt fell 3 months ago and struck head, but did not see a doctor.  No otc meds for h/a  No n/v/d.  Numbness around right eye for 2 weeks.   Pt alert  Speech clear.

## 2019-10-27 NOTE — ED Notes (Signed)
Pt hung up phone call d/t his calling "dropping" Pt tells writer that he was injured a few weeks ago at [employer], upon entering [employer] he "tripped over a rolled up rug" and states that he was not checked out at the time of injury because "well I just didn't think about it but now my girlfriend has told me that I could get a check from them so I need to be checked out"

## 2019-10-27 NOTE — ED Notes (Signed)
This tech trying to start pt triage and pt will not hang up his phone call

## 2019-10-27 NOTE — ED Provider Notes (Signed)
[facility] Emergency Department Provider Note       Time seen: ----------------------------------------- 8:36 PM on 10/27/2019 -----------------------------------------  I have reviewed the triage vital signs and the nursing notes.  HISTORY   Chief Complaint Headache and Numbness    HPI Brandon Marks is a 64 y.o. male with a history of arthritis, hyperlipidemia, hypertension, sleep apnea who presents to the ED for persistent frontal headache as well as some numbness around the right eye.  Patient reports falling 3 months ago and striking his head but did not see a doctor.  He has not had over-the-counter medications for headache, denies nausea, vomiting or diarrhea.  Occasionally he has had some vision changes in his left eye.  Past Medical History:  Diagnosis Date  . Arthritis   . Herniated disc   . High cholesterol   . Hypertension   . Morbid obesity ([place])   . PAC (premature atrial contraction)   . Sleep apnea    non compliant with c-pap    Patient Active Problem List   Diagnosis Date Noted  . S/P right TKA 09/13/2019  . S/P left TKA 08/11/2019  . Genu varum of both lower extremities 06/13/2019  . Vitamin B12 deficiency 06/13/2019  . Obesity (BMI 30-39.9) 12/27/2018  . 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 Tinnitus of right ear 12/27/2018  . Gait disturbance 12/27/2018  . Normochromic anemia 12/27/2018  . Primary osteoarthritis of both knees 12/27/2018  . Diastolic heart failure ([place]) 09/11/2017  . DDD (degenerative disc disease), cervical 09/04/2017  . Dyspnea, chronic DOE 02/27/2012  . Herniated disc   . Hyperlipidemia 02/23/2009  . 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 Essential hypertension 10/18/2007  . OSA on CPAP 10/11/2007    Past Surgical History:  Procedure Laterality Date  . CARDIAC CATHETERIZATION  02/20/2009   normal coronary arteries  . EYE SURGERY     bilateral cataract with lens implant   . INCISION AND DRAINAGE PERIRECTAL ABSCESS N/A 01/02/2018   Procedure: IRRIGATION AND DEBRIDEMENT PERIRECTAL ABSCESS;  Surgeon: Coralie Keens, MD;  Location: [facility];  Service: General;  Laterality: N/A;  . IRRIGATION AND DEBRIDEMENT BUTTOCKS N/A 01/05/2018   Procedure: DEBRIDEMENT BUTTOCKS ABSCESS;  Surgeon: Georganna Skeans, MD;  Location: [facility];  Service: General;  Laterality: N/A;  . KNEE ARTHROSCOPY  2010   Lt  . NM MYOCAR PERF WALL MOTION  09/28/2007   small area of reversibility in the anterolateral wall at the apex concerning for ischemia  . SHOULDER ARTHROSCOPY  4/12   Rt  . TOTAL KNEE ARTHROPLASTY Left 08/11/2019   Procedure: TOTAL KNEE ARTHROPLASTY;  Surgeon: Paralee Cancel, MD;  Location: [facility];  Service: Orthopedics;  Laterality: Left;  70 mins  . TOTAL KNEE ARTHROPLASTY Right 09/13/2019   Procedure: TOTAL KNEE ARTHROPLASTY;  Surgeon: Paralee Cancel, MD;  Location: [facility];  Service: Orthopedics;  Laterality: Right;  70 mins  . WOUND DEBRIDEMENT N/A 01/04/2018   Procedure: IRRIGATION AND DEBRIDEMENT OF BUTTOCKS AND REMOVAL OF TICK FROM RIGHT TESTICLE;  Surgeon: Georganna Skeans, MD;  Location: [facility];  Service: General;  Laterality: N/A;    Allergies Codeine and Oxycodone  Social History Social History   Tobacco Use  . Smoking status: Never Smoker  . Smokeless tobacco: Never Used  Substance Use Topics  . Alcohol use: No  . Drug use: No    Review of Systems Constitutional: Negative for fever. Cardiovascular: Negative for chest pain. Respiratory: Negative for shortness of breath.  Gastrointestinal: Negative for abdominal pain, vomiting and diarrhea. Musculoskeletal: Negative for back pain. Skin: Negative for rash. Neurological: Positive for headache, facial numbness  All systems negative/normal/unremarkable except as stated in the HPI  ____________________________________________   PHYSICAL EXAM:  VITAL SIGNS: ED Triage Vitals [10/27/19 1632]  Enc Vitals Group      BP (!) 139/92     Pulse Rate 65     Resp 18     Temp 98.6 F (37 C)     Temp Source Oral     SpO2 97 %     Weight 225 lb (102.1 kg)     Height 6\' 2"  (1.88 m)     Head Circumference      Peak Flow      Pain Score 0     Pain Loc      Pain Edu?      Excl. in [place]?     Constitutional: Alert and oriented. Well appearing and in no distress. Eyes: Conjunctivae are normal. Normal extraocular movements. ENT      Head: Normocephalic and atraumatic.      Nose: No congestion/rhinnorhea.      Mouth/Throat: Mucous membranes are moist.      Neck: No stridor. Cardiovascular: Slow rate, regular rhythm. No murmurs, rubs, or gallops. Respiratory: Normal respiratory effort without tachypnea nor retractions. Breath sounds are clear and equal bilaterally. No wheezes/rales/rhonchi. Gastrointestinal: Soft and nontender. Normal bowel sounds Musculoskeletal: Nontender with normal range of motion in extremities. No lower extremity tenderness nor edema. Neurologic:  Normal speech and language. No gross focal neurologic deficits are appreciated.  Right periorbital decrease sensation, lower right face appears to be spared. Skin:  Skin is warm, dry and intact. No rash noted. Psychiatric: Mood and affect are normal. Speech and behavior are normal.  ____________________________________________  EKG: Interpreted by me.  Sinus rhythm with rate of 58 bpm, PACs are noted, normal QT  ____________________________________________  ED COURSE:  As part of my medical decision making, I reviewed the following data within the [facility] History obtained from family if available, nursing notes, old chart and ekg, as well as notes from prior ED visits. Patient presented for headache, we will assess with labs and imaging as indicated at this time.   Procedures  Brandon Marks was evaluated in Emergency Department on 10/27/2019 for the symptoms described in the history of present illness. He was evaluated  in the context of the global COVID-19 pandemic, which necessitated consideration that the patient might be at risk for infection with the SARS-CoV-2 virus that causes COVID-19. Institutional protocols and algorithms that pertain to the evaluation of patients at risk for COVID-19 are in a state of rapid change based on information released by regulatory bodies including the CDC and federal and state organizations. These policies and algorithms were followed during the patient's care in the ED.  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 Mass-effect upon the brainstem and cerebellum. Distortion of the fourth ventricle but no ventricular obstruction. MRI with and without contrast recommended for further evaluation. Brain MRI ____________________________________________   DIFFERENTIAL DIAGNOSIS   Migraine headache, tension headache, contusion, subdural, subarachnoid, brain tumor  FINAL ASSESSMENT AND PLAN  Headache, brain tumor   Plan: The patient had presented for headache and right periorbital  paresthesia. Patient's labs were unremarkable. 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  I discussed the case with neurosurgery on-call who will arrange close outpatient follow-up and discuss treatment options.   Laurence Aly, MD    Note: This note was generated in part or whole with voice recognition software. Voice recognition is usually quite accurate but there are transcription errors that can and very often do occur. I apologize for any typographical errors that were not detected and corrected.     Earleen Newport, MD 10/27/19 2231

## 2019-10-28 NOTE — ED Notes (Signed)
Pt given crackers and drink at thsi time okay per MD williams

## 2019-11-01 DIAGNOSIS — D496 Neoplasm of unspecified behavior of brain: Secondary | ICD-10-CM | POA: Diagnosis not present

## 2019-11-04 DIAGNOSIS — H903 Sensorineural hearing loss, bilateral: Secondary | ICD-10-CM | POA: Diagnosis not present

## 2019-11-04 DIAGNOSIS — H6121 Impacted cerumen, right ear: Secondary | ICD-10-CM | POA: Diagnosis not present

## 2019-11-04 DIAGNOSIS — D333 Benign neoplasm of cranial nerves: Secondary | ICD-10-CM | POA: Diagnosis not present

## 2019-11-04 DIAGNOSIS — H90A21 Sensorineural hearing loss, unilateral, right ear, with restricted hearing on the contralateral side: Secondary | ICD-10-CM | POA: Diagnosis not present

## 2019-11-07 ENCOUNTER — Telehealth: Payer: Self-pay | Admitting: Internal Medicine

## 2019-11-07 NOTE — Telephone Encounter (Signed)
Sleep center called to make pcp has aware that pt has a procedure and sleep study has been moved

## 2019-11-08 ENCOUNTER — Ambulatory Visit (INDEPENDENT_AMBULATORY_CARE_PROVIDER_SITE_OTHER): Payer: Medicare HMO | Admitting: Otolaryngology

## 2019-11-08 ENCOUNTER — Other Ambulatory Visit (HOSPITAL_COMMUNITY): Payer: Medicare HMO

## 2019-11-10 ENCOUNTER — Encounter (HOSPITAL_BASED_OUTPATIENT_CLINIC_OR_DEPARTMENT_OTHER): Payer: Medicare HMO | Admitting: Internal Medicine

## 2019-11-11 NOTE — Telephone Encounter (Signed)
Contacted [employer] and spoke to [place] intake representative and got the service date of the sleep study changed to 5/27.

## 2019-11-14 DIAGNOSIS — R42 Dizziness and giddiness: Secondary | ICD-10-CM | POA: Diagnosis not present

## 2019-11-17 DIAGNOSIS — D333 Benign neoplasm of cranial nerves: Secondary | ICD-10-CM | POA: Diagnosis not present

## 2019-11-17 DIAGNOSIS — H818X1 Other disorders of vestibular function, right ear: Secondary | ICD-10-CM | POA: Insufficient documentation

## 2019-11-17 DIAGNOSIS — H918X3 Other specified hearing loss, bilateral: Secondary | ICD-10-CM | POA: Insufficient documentation

## 2019-11-17 DIAGNOSIS — H9041 Sensorineural hearing loss, unilateral, right ear, with unrestricted hearing on the contralateral side: Secondary | ICD-10-CM | POA: Diagnosis not present

## 2019-11-24 DIAGNOSIS — D333 Benign neoplasm of cranial nerves: Secondary | ICD-10-CM | POA: Diagnosis not present

## 2019-12-20 ENCOUNTER — Inpatient Hospital Stay (HOSPITAL_COMMUNITY): Admission: RE | Admit: 2019-12-20 | Payer: Medicare HMO | Source: Ambulatory Visit

## 2019-12-22 ENCOUNTER — Encounter (HOSPITAL_BASED_OUTPATIENT_CLINIC_OR_DEPARTMENT_OTHER): Payer: Medicare HMO | Admitting: Internal Medicine

## 2019-12-22 DIAGNOSIS — Z01818 Encounter for other preprocedural examination: Secondary | ICD-10-CM | POA: Diagnosis not present

## 2019-12-22 DIAGNOSIS — D333 Benign neoplasm of cranial nerves: Secondary | ICD-10-CM | POA: Diagnosis not present

## 2019-12-22 DIAGNOSIS — Z6829 Body mass index (BMI) 29.0-29.9, adult: Secondary | ICD-10-CM | POA: Diagnosis not present

## 2019-12-22 DIAGNOSIS — Z20822 Contact with and (suspected) exposure to covid-19: Secondary | ICD-10-CM | POA: Diagnosis not present

## 2019-12-22 DIAGNOSIS — R2689 Other abnormalities of gait and mobility: Secondary | ICD-10-CM | POA: Diagnosis not present

## 2019-12-22 DIAGNOSIS — H905 Unspecified sensorineural hearing loss: Secondary | ICD-10-CM | POA: Diagnosis not present

## 2019-12-26 DIAGNOSIS — Z20822 Contact with and (suspected) exposure to covid-19: Secondary | ICD-10-CM | POA: Diagnosis not present

## 2019-12-26 DIAGNOSIS — D333 Benign neoplasm of cranial nerves: Secondary | ICD-10-CM | POA: Diagnosis not present

## 2019-12-27 DIAGNOSIS — G4733 Obstructive sleep apnea (adult) (pediatric): Secondary | ICD-10-CM | POA: Diagnosis not present

## 2019-12-27 DIAGNOSIS — D333 Benign neoplasm of cranial nerves: Secondary | ICD-10-CM | POA: Diagnosis not present

## 2019-12-27 DIAGNOSIS — Z96653 Presence of artificial knee joint, bilateral: Secondary | ICD-10-CM | POA: Diagnosis not present

## 2019-12-27 DIAGNOSIS — H9311 Tinnitus, right ear: Secondary | ICD-10-CM | POA: Diagnosis not present

## 2019-12-27 DIAGNOSIS — D649 Anemia, unspecified: Secondary | ICD-10-CM | POA: Diagnosis not present

## 2019-12-27 DIAGNOSIS — R42 Dizziness and giddiness: Secondary | ICD-10-CM | POA: Diagnosis not present

## 2019-12-27 DIAGNOSIS — Z9884 Bariatric surgery status: Secondary | ICD-10-CM | POA: Diagnosis not present

## 2019-12-27 DIAGNOSIS — Z9889 Other specified postprocedural states: Secondary | ICD-10-CM | POA: Diagnosis not present

## 2019-12-27 DIAGNOSIS — D331 Benign neoplasm of brain, infratentorial: Secondary | ICD-10-CM | POA: Diagnosis not present

## 2019-12-27 DIAGNOSIS — Z77122 Contact with and (suspected) exposure to noise: Secondary | ICD-10-CM | POA: Diagnosis not present

## 2019-12-27 DIAGNOSIS — H9191 Unspecified hearing loss, right ear: Secondary | ICD-10-CM | POA: Diagnosis not present

## 2019-12-27 HISTORY — PX: OTHER SURGICAL HISTORY: SHX169

## 2019-12-30 DIAGNOSIS — H9311 Tinnitus, right ear: Secondary | ICD-10-CM | POA: Diagnosis not present

## 2019-12-30 DIAGNOSIS — D333 Benign neoplasm of cranial nerves: Secondary | ICD-10-CM | POA: Diagnosis not present

## 2019-12-30 DIAGNOSIS — G4733 Obstructive sleep apnea (adult) (pediatric): Secondary | ICD-10-CM | POA: Diagnosis not present

## 2019-12-30 DIAGNOSIS — Z77122 Contact with and (suspected) exposure to noise: Secondary | ICD-10-CM | POA: Diagnosis not present

## 2019-12-30 DIAGNOSIS — R42 Dizziness and giddiness: Secondary | ICD-10-CM | POA: Diagnosis not present

## 2019-12-30 DIAGNOSIS — D649 Anemia, unspecified: Secondary | ICD-10-CM | POA: Diagnosis not present

## 2019-12-30 DIAGNOSIS — Z9884 Bariatric surgery status: Secondary | ICD-10-CM | POA: Diagnosis not present

## 2019-12-30 DIAGNOSIS — Z9889 Other specified postprocedural states: Secondary | ICD-10-CM | POA: Diagnosis not present

## 2019-12-30 DIAGNOSIS — Z96653 Presence of artificial knee joint, bilateral: Secondary | ICD-10-CM | POA: Diagnosis not present

## 2019-12-30 DIAGNOSIS — H9191 Unspecified hearing loss, right ear: Secondary | ICD-10-CM | POA: Diagnosis not present

## 2020-01-05 DIAGNOSIS — G9782 Other postprocedural complications and disorders of nervous system: Secondary | ICD-10-CM | POA: Diagnosis not present

## 2020-01-05 DIAGNOSIS — I10 Essential (primary) hypertension: Secondary | ICD-10-CM | POA: Diagnosis not present

## 2020-01-05 DIAGNOSIS — Z96653 Presence of artificial knee joint, bilateral: Secondary | ICD-10-CM | POA: Diagnosis not present

## 2020-01-05 DIAGNOSIS — G9601 Cranial cerebrospinal fluid leak, spontaneous: Secondary | ICD-10-CM | POA: Diagnosis not present

## 2020-01-05 DIAGNOSIS — G4733 Obstructive sleep apnea (adult) (pediatric): Secondary | ICD-10-CM | POA: Diagnosis not present

## 2020-01-05 DIAGNOSIS — G9608 Other cranial cerebrospinal fluid leak: Secondary | ICD-10-CM | POA: Diagnosis not present

## 2020-01-05 DIAGNOSIS — Z885 Allergy status to narcotic agent status: Secondary | ICD-10-CM | POA: Diagnosis not present

## 2020-01-05 DIAGNOSIS — Z8669 Personal history of other diseases of the nervous system and sense organs: Secondary | ICD-10-CM | POA: Diagnosis not present

## 2020-01-05 DIAGNOSIS — Z9884 Bariatric surgery status: Secondary | ICD-10-CM | POA: Diagnosis not present

## 2020-01-05 DIAGNOSIS — R339 Retention of urine, unspecified: Secondary | ICD-10-CM | POA: Diagnosis not present

## 2020-01-05 DIAGNOSIS — Z20822 Contact with and (suspected) exposure to covid-19: Secondary | ICD-10-CM | POA: Diagnosis not present

## 2020-01-05 DIAGNOSIS — D333 Benign neoplasm of cranial nerves: Secondary | ICD-10-CM | POA: Diagnosis not present

## 2020-01-05 DIAGNOSIS — E785 Hyperlipidemia, unspecified: Secondary | ICD-10-CM | POA: Diagnosis not present

## 2020-01-05 DIAGNOSIS — Z01818 Encounter for other preprocedural examination: Secondary | ICD-10-CM | POA: Diagnosis not present

## 2020-01-06 DIAGNOSIS — Z01818 Encounter for other preprocedural examination: Secondary | ICD-10-CM | POA: Diagnosis not present

## 2020-01-06 DIAGNOSIS — Z20822 Contact with and (suspected) exposure to covid-19: Secondary | ICD-10-CM | POA: Diagnosis not present

## 2020-01-12 ENCOUNTER — Ambulatory Visit: Payer: Medicare HMO | Admitting: Internal Medicine

## 2020-02-01 DIAGNOSIS — R2689 Other abnormalities of gait and mobility: Secondary | ICD-10-CM | POA: Diagnosis not present

## 2020-02-07 ENCOUNTER — Encounter: Payer: Self-pay | Admitting: Internal Medicine

## 2020-02-07 ENCOUNTER — Other Ambulatory Visit: Payer: Self-pay

## 2020-02-07 ENCOUNTER — Ambulatory Visit: Payer: Medicare HMO | Attending: Internal Medicine | Admitting: Internal Medicine

## 2020-02-07 VITALS — BP 127/82 | HR 106 | Temp 98.6°F | Resp 16 | Wt 225.0 lb

## 2020-02-07 DIAGNOSIS — E663 Overweight: Secondary | ICD-10-CM

## 2020-02-07 DIAGNOSIS — I5032 Chronic diastolic (congestive) heart failure: Secondary | ICD-10-CM | POA: Diagnosis not present

## 2020-02-07 DIAGNOSIS — Z4802 Encounter for removal of sutures: Secondary | ICD-10-CM

## 2020-02-07 DIAGNOSIS — G4733 Obstructive sleep apnea (adult) (pediatric): Secondary | ICD-10-CM | POA: Diagnosis not present

## 2020-02-07 DIAGNOSIS — Z86018 Personal history of other benign neoplasm: Secondary | ICD-10-CM | POA: Insufficient documentation

## 2020-02-07 DIAGNOSIS — Z9889 Other specified postprocedural states: Secondary | ICD-10-CM | POA: Diagnosis not present

## 2020-02-07 DIAGNOSIS — I10 Essential (primary) hypertension: Secondary | ICD-10-CM | POA: Diagnosis not present

## 2020-02-07 NOTE — Progress Notes (Signed)
Patient ID: NYCHOLAS RAYNER, male    DOB: 07-14-1956  MRN: [id number]  CC: Hypertension   Subjective: Brandon Marks is a 64 y.o. male who presents for chronic ds management.  Last visit 09/2019. His concerns today include:  Pt with hx of HTN, dCHF, OSA on CPAP, HL, morbid obesity, gastric bypass, normocytic anemia, wgh loss surgery (Roux-en-Y)07/2017 at [facility]), BL TKR.    Since last visit with me, patient was seen in the emergency room in April for headache.  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  He was seen by a neurosurgeon who referred him to [facility].  He had resection of the tumor 12/27/2019.  Postsurgical course was complicated with cerebrospinal fluid leak.  Last follow-up with [facility] neurosurgery was on the seventh of this month.  He is going to outpatient PT at [facility]  He has some right-sided facial numbness, decreased taste on the right side and a little blurred vision on the right side since the surgery.  States he was told by the surgeon that it may take up to a year to see whether the nerves would heal and the symptoms resolve.  He has not had any falls.  He still has some sutures in his lower back that were put in when he had the spinal fluid leak.  States that they should have been taken out a while back but the neurosurgeon forgot to do it on the last visit.  He is requesting if I can remove them today for him.   HTN/CHF: Lisin/HCTZ stopped completely by his cardiologist Dr. Debara Pickett.  He denies any chest pains or shortness of breath.  No lower extremity edema.  OSA:  Did not get the 2nd part of the sleep study done.  He states at the time when he was supposed to have it done the issue came up with the intracranial tumor.  He plans to get the second part of it done once he is done with physical therapy.    Obesity/status post Roux-en-Y procedure:  Doing well with eating habits.  His weight continues to come down. Patient Active Problem List   Diagnosis Date Noted  . S/P right TKA 09/13/2019  . S/P left TKA 08/11/2019  . Genu varum of both lower extremities 06/13/2019  . Vitamin B12 deficiency 06/13/2019  . Obesity (BMI 30-39.9) 12/27/2018  . 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 Tinnitus of right ear 12/27/2018  . Gait disturbance 12/27/2018  . Normochromic anemia 12/27/2018  . Primary osteoarthritis of both knees 12/27/2018  . Diastolic heart failure ([place]) 09/11/2017  . DDD (degenerative disc disease), cervical 09/04/2017  . Dyspnea, chronic DOE 02/27/2012  . Herniated disc   . Hyperlipidemia 02/23/2009  . 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 Essential hypertension 10/18/2007  . OSA on CPAP 10/11/2007     Current Outpatient Medications on File Prior to Visit  Medication Sig Dispense Refill  . docusate sodium (COLACE) 100 MG capsule Take 1 capsule (100 mg total) by mouth 2 (two) times daily. 28 capsule 0  . ferrous sulfate (FERROUSUL) 325 (65 FE) MG tablet Take 1 tablet (325 mg total) by mouth 3 (three) times daily with meals for 14 days. 42 tablet 0  . HYDROcodone-acetaminophen (NORCO) 5-325 MG tablet Take 1-2 tablets by mouth every 6 (six) hours as needed for moderate pain or severe pain. 40 tablet 0  .  levocetirizine (XYZAL) 5 MG tablet Take 5 mg by mouth daily.    Marland Kitchen lisinopril-hydrochlorothiazide (ZESTORETIC) 20-25 MG tablet Take 1 tablet by mouth 3 (three) times a week.     . lovastatin (MEVACOR) 40 MG tablet Take 0.5 tablets (20 mg total) by mouth at bedtime. (Patient taking differently: Take 20 mg by mouth 3 (three) times a week. ) 45 tablet 3  . methocarbamol (ROBAXIN) 500 MG tablet Take 1 tablet (500 mg total) by mouth every 6 (six) hours as needed for muscle spasms. 40 tablet 0  . ondansetron (ZOFRAN) 4 MG tablet Take 1 tablet (4 mg total) by mouth every 8 (eight) hours as needed for nausea  or vomiting. 30 tablet 0  . polyethylene glycol (MIRALAX / GLYCOLAX) 17 g packet Take 17 g by mouth 2 (two) times daily. 28 packet 0   No current facility-administered medications on file prior to visit.    Allergies  Allergen Reactions  . Codeine Nausea And Vomiting  . Oxycodone Other (See Comments)    Upset GI    Social History   Socioeconomic History  . Marital status: Divorced    Spouse name: Not on file  . Number of children: 0  . Years of education: some college  . Highest education level: Not on file  Occupational History  . Occupation: Retired  Tobacco Use  . Smoking status: Never Smoker  . Smokeless tobacco: Never Used  Vaping Use  . Vaping Use: Never used  Substance and Sexual Activity  . Alcohol use: No  . Drug use: No  . Sexual activity: Not on file  Other Topics Concern  . Not on file  Social History Narrative   Denies caffeine use    Social Determinants of Health   Financial Resource Strain:   . Difficulty of Paying Living Expenses:   Food Insecurity:   . Worried About Charity fundraiser in the Last Year:   . Arboriculturist in the Last Year:   Transportation Needs:   . Film/video editor (Medical):   Marland Kitchen Lack of Transportation (Non-Medical):   Physical Activity:   . Days of Exercise per Week:   . Minutes of Exercise per Session:   Stress:   . Feeling of Stress :   Social Connections:   . Frequency of Communication with Friends and Family:   . Frequency of Social Gatherings with Friends and Family:   . Attends Religious Services:   . Active Member of Clubs or Organizations:   . Attends Archivist Meetings:   Marland Kitchen Marital Status:   Intimate Partner Violence:   . Fear of Current or Ex-Partner:   . Emotionally Abused:   Marland Kitchen Physically Abused:   . Sexually Abused:     Family History  Problem Relation Age of Onset  . Hypertension Mother   . Heart failure Mother   . Alzheimer's disease Mother   . Skin cancer Mother   . Heart  disease Father   . Alzheimer's disease Maternal Grandmother   . Alzheimer's disease Maternal Grandfather     Past Surgical History:  Procedure Laterality Date  . CARDIAC CATHETERIZATION  02/20/2009   normal coronary arteries  . EYE SURGERY     bilateral cataract with lens implant  . INCISION AND DRAINAGE PERIRECTAL ABSCESS N/A 01/02/2018   Procedure: IRRIGATION AND DEBRIDEMENT PERIRECTAL ABSCESS;  Surgeon: Coralie Keens, MD;  Location: [place];  Service: General;  Laterality: N/A;  . IRRIGATION AND DEBRIDEMENT BUTTOCKS N/A  01/05/2018   Procedure: DEBRIDEMENT BUTTOCKS ABSCESS;  Surgeon: Georganna Skeans, MD;  Location: [place];  Service: General;  Laterality: N/A;  . KNEE ARTHROSCOPY  2010   Lt  . NM MYOCAR PERF WALL MOTION  09/28/2007   small area of reversibility in the anterolateral wall at the apex concerning for ischemia  . SHOULDER ARTHROSCOPY  4/12   Rt  . TOTAL KNEE ARTHROPLASTY Left 08/11/2019   Procedure: TOTAL KNEE ARTHROPLASTY;  Surgeon: Paralee Cancel, MD;  Location: [facility];  Service: Orthopedics;  Laterality: Left;  70 mins  . TOTAL KNEE ARTHROPLASTY Right 09/13/2019   Procedure: TOTAL KNEE ARTHROPLASTY;  Surgeon: Paralee Cancel, MD;  Location: [facility];  Service: Orthopedics;  Laterality: Right;  70 mins  . WOUND DEBRIDEMENT N/A 01/04/2018   Procedure: IRRIGATION AND DEBRIDEMENT OF BUTTOCKS AND REMOVAL OF TICK FROM RIGHT TESTICLE;  Surgeon: Georganna Skeans, MD;  Location: [place];  Service: General;  Laterality: N/A;    ROS: Review of Systems Negative except as stated above  PHYSICAL EXAM: BP 127/82   Pulse (!) 106   Temp 98.6 F (37 C)   Resp 16   Wt 225 lb (102.1 kg)   SpO2 97%   BMI 28.89 kg/m   Wt Readings from Last 3 Encounters:  02/07/20 225 lb (102.1 kg)  10/27/19 225 lb (102.1 kg)  09/13/19 230 lb 1 oz (104.4 kg)    Physical Exam General appearance - alert, well appearing, and in no distress Mental status - normal mood, behavior, speech, dress, motor activity,  and thought processes Chest - clear to auscultation, no wheezes, rales or rhonchi, symmetric air entry Heart - normal rate, regular rhythm, normal S1, S2, no murmurs, rubs, clicks or gallops Extremities - peripheral pulses normal, no pedal edema, no clubbing or cyanosis Skin -area on the lower midline thoracic spine is well-healed.  3 sutures were removed without incident.  CMP Latest Ref Rng & Units 10/27/2019 09/15/2019 09/14/2019  Glucose 70 - 99 mg/dL 85 84 111(H)  BUN 8 - 23 mg/dL 15 23 27(H)  Creatinine 0.61 - 1.24 mg/dL 0.76 0.71 1.06  Sodium 135 - 145 mmol/L 138 137 140  Potassium 3.5 - 5.1 mmol/L 3.6 4.2 4.8  Chloride 98 - 111 mmol/L 103 104 108  CO2 22 - 32 mmol/L _0 Calcium 8.9 - 10.3 mg/dL 8.8(L) 8.6(L) 8.5(L)  Total Protein 6.5 - 8.1 g/dL 6.8 - -  Total Bilirubin 0.3 - 1.2 mg/dL 0.6 - -  Alkaline Phos 38 - 126 U/L 47 - -  AST 15 - 41 U/L 12(L) - -  ALT 0 - 44 U/L 10 - -   Lipid Panel     Component Value Date/Time   CHOL 99 (L) 12/27/2018 1144   TRIG 43 12/27/2018 1144   HDL 49 12/27/2018 1144   CHOLHDL 2.0 12/27/2018 1144   LDLCALC 41 12/27/2018 1144    CBC    Component Value Date/Time   WBC 7.2 10/27/2019 2037   RBC 3.83 (L) 10/27/2019 2037   HGB 11.6 (L) 10/27/2019 2037   HGB 13.0 12/27/2018 1144   HCT 35.6 (L) 10/27/2019 2037   HCT 38.0 12/27/2018 1144   PLT 270 10/27/2019 2037   PLT 283 12/27/2018 1144   MCV 93.0 10/27/2019 2037   MCV 90 12/27/2018 1144   MCH 30.3 10/27/2019 2037   MCHC 32.6 10/27/2019 2037   RDW 13.8 10/27/2019 2037   RDW 12.8 12/27/2018 1144   LYMPHSABS 1.6 10/27/2019  2037   MONOABS 0.8 10/27/2019 2037   EOSABS 0.1 10/27/2019 2037   BASOSABS 0.0 10/27/2019 2037    ASSESSMENT AND PLAN: 1. Essential hypertension Doing well off medication.  Encourage him to continue low-salt diet.  2. Chronic diastolic heart failure ([place]) Clinically stable and followed by cardiology.  3. Encounter for removal of sutures Sutures removed  without incident using a suture removal kit.  4. Status post excision of acoustic neuroma Followed by a neurosurgeon at [facility].  5. OSA (obstructive sleep apnea) Plans to do the second part of his sleep study once he is done with physical therapy at [facility].  6. Over weight Commended him on his continued weight loss.  He is only a few points away from normal BMI.  Encouraged him to continue healthy eating habits.   Patient was given the opportunity to ask questions.  Patient verbalized understanding of the plan and was able to repeat key elements of the plan.   No orders of the defined types were placed in this encounter.    Requested Prescriptions    No prescriptions requested or ordered in this encounter    No follow-ups on file.  Karle Plumber, MD, FACP

## 2020-02-08 DIAGNOSIS — D333 Benign neoplasm of cranial nerves: Secondary | ICD-10-CM | POA: Diagnosis not present

## 2020-02-08 DIAGNOSIS — Z9889 Other specified postprocedural states: Secondary | ICD-10-CM | POA: Diagnosis not present

## 2020-03-16 DIAGNOSIS — R2689 Other abnormalities of gait and mobility: Secondary | ICD-10-CM | POA: Diagnosis not present

## 2020-04-19 DIAGNOSIS — R2689 Other abnormalities of gait and mobility: Secondary | ICD-10-CM | POA: Diagnosis not present

## 2020-05-08 DIAGNOSIS — R2689 Other abnormalities of gait and mobility: Secondary | ICD-10-CM | POA: Diagnosis not present

## 2020-05-21 DIAGNOSIS — H818X1 Other disorders of vestibular function, right ear: Secondary | ICD-10-CM | POA: Diagnosis not present

## 2020-05-21 DIAGNOSIS — H918X9 Other specified hearing loss, unspecified ear: Secondary | ICD-10-CM | POA: Diagnosis not present

## 2020-06-18 DIAGNOSIS — H903 Sensorineural hearing loss, bilateral: Secondary | ICD-10-CM | POA: Diagnosis not present

## 2020-06-18 DIAGNOSIS — H918X9 Other specified hearing loss, unspecified ear: Secondary | ICD-10-CM | POA: Diagnosis not present

## 2020-06-18 DIAGNOSIS — H818X1 Other disorders of vestibular function, right ear: Secondary | ICD-10-CM | POA: Diagnosis not present

## 2020-07-02 ENCOUNTER — Other Ambulatory Visit (HOSPITAL_COMMUNITY): Payer: Self-pay | Admitting: Neurosurgery

## 2020-07-02 DIAGNOSIS — Z86018 Personal history of other benign neoplasm: Secondary | ICD-10-CM

## 2020-07-02 DIAGNOSIS — Z9889 Other specified postprocedural states: Secondary | ICD-10-CM

## 2020-07-12 ENCOUNTER — Other Ambulatory Visit: Payer: Medicare HMO

## 2020-08-01 DIAGNOSIS — M25512 Pain in left shoulder: Secondary | ICD-10-CM | POA: Insufficient documentation

## 2020-08-01 DIAGNOSIS — M19012 Primary osteoarthritis, left shoulder: Secondary | ICD-10-CM | POA: Diagnosis not present

## 2020-08-01 DIAGNOSIS — M7542 Impingement syndrome of left shoulder: Secondary | ICD-10-CM | POA: Diagnosis not present

## 2020-08-07 ENCOUNTER — Ambulatory Visit: Admission: RE | Admit: 2020-08-07 | Payer: Medicare HMO | Source: Ambulatory Visit

## 2020-08-30 DIAGNOSIS — M25512 Pain in left shoulder: Secondary | ICD-10-CM | POA: Diagnosis not present

## 2020-09-19 DIAGNOSIS — M25512 Pain in left shoulder: Secondary | ICD-10-CM | POA: Diagnosis not present

## 2020-10-04 ENCOUNTER — Ambulatory Visit
Admission: EM | Admit: 2020-10-04 | Discharge: 2020-10-04 | Disposition: A | Discharge status: Home / Self Care | Payer: Medicare HMO | Attending: Emergency Medicine | Admitting: Emergency Medicine

## 2020-10-04 ENCOUNTER — Ambulatory Visit: Payer: Self-pay | Admitting: *Deleted

## 2020-10-04 ENCOUNTER — Other Ambulatory Visit: Payer: Self-pay

## 2020-10-04 DIAGNOSIS — R5383 Other fatigue: Secondary | ICD-10-CM

## 2020-10-04 DIAGNOSIS — R42 Dizziness and giddiness: Secondary | ICD-10-CM | POA: Diagnosis not present

## 2020-10-04 NOTE — Telephone Encounter (Signed)
Will forward to provider  

## 2020-10-04 NOTE — Discharge Instructions (Addendum)
We will contact you with the results from your lab work.    Drink lots of fluids and rest.    Go to the Emergency Department, if you are having increased dizziness, loss of consciousness, increased fatigue or weakness, headache, numbness, or blurred vision.

## 2020-10-04 NOTE — ED Provider Notes (Signed)
[facility]    CSN: [id number] Arrival date & time: 10/04/20  1535      History   Chief Complaint Chief Complaint  Patient presents with  . Dizziness    HPI Brandon Marks is a 65 y.o. male.   Brandon Marks is a 65 year old male with complaint of dizziness and fatigue for the past 3 days.  Reports dizziness feels like he is "spinning" and episodes last about 10 minutes.  Denies any nausea or vomiting.  Reports being unable to focus and it looks like "things are shaking." Denies any headaches or chest pain.  Reports hx of brain tumor. Denies any alleviating or aggravating factors.   The history is provided by the patient.  Dizziness Quality:  Head spinning Severity:  Moderate Onset quality:  Sudden Duration:  3 days Timing:  Intermittent Progression:  Waxing and waning Chronicity:  New Relieved by:  Nothing Worsened by:  Nothing Ineffective treatments:  None tried Associated symptoms: no headaches, no shortness of breath and no weakness     Past Medical History:  Diagnosis Date  . Arthritis   . Herniated disc   . High cholesterol   . Hypertension   . Morbid obesity ([place])   . PAC (premature atrial contraction)   . Sleep apnea    non compliant with c-pap    Patient Active Problem List   Diagnosis Date Noted  . Status post excision of acoustic neuroma 02/07/2020  . S/P right TKA 09/13/2019  . S/P left TKA 08/11/2019  . Genu varum of both lower extremities 06/13/2019  . Vitamin B12 deficiency 06/13/2019  . Obesity (BMI 30-39.9) 12/27/2018  . 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 Tinnitus of right ear 12/27/2018  . Gait disturbance 12/27/2018  . Normochromic anemia 12/27/2018  . Primary osteoarthritis of both knees 12/27/2018  . Diastolic heart failure ([place]) 09/11/2017  . DDD (degenerative disc disease), cervical 09/04/2017  . Dyspnea, chronic DOE 02/27/2012  . Herniated disc   . Hyperlipidemia 02/23/2009  . Diastolic  dysfunction, left ventricle 02/23/2009  . Essential hypertension 10/18/2007  . OSA on CPAP 10/11/2007    Past Surgical History:  Procedure Laterality Date  . 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 at [facility]    . CARDIAC CATHETERIZATION  02/20/2009   normal coronary arteries  . EYE SURGERY     bilateral cataract with lens implant  . INCISION AND DRAINAGE PERIRECTAL ABSCESS N/A 01/02/2018   Procedure: IRRIGATION AND DEBRIDEMENT PERIRECTAL ABSCESS;  Surgeon: Coralie Keens, MD;  Location: [place];  Service: General;  Laterality: N/A;  . IRRIGATION AND DEBRIDEMENT BUTTOCKS N/A 01/05/2018   Procedure: DEBRIDEMENT BUTTOCKS ABSCESS;  Surgeon: Georganna Skeans, MD;  Location: [place];  Service: General;  Laterality: N/A;  . KNEE ARTHROSCOPY  2010   Lt  . NM MYOCAR PERF WALL MOTION  09/28/2007   small area of reversibility in the anterolateral wall at the apex concerning for ischemia  . SHOULDER ARTHROSCOPY  4/12   Rt  . TOTAL KNEE ARTHROPLASTY Left 08/11/2019   Procedure: TOTAL KNEE ARTHROPLASTY;  Surgeon: Paralee Cancel, MD;  [facility];  Service: Orthopedics;  Laterality: Left;  70 mins  . TOTAL KNEE ARTHROPLASTY Right 09/13/2019   Procedure: TOTAL KNEE ARTHROPLASTY;  Surgeon: Paralee Cancel, MD;  [facility];  Service: Orthopedics;  Laterality: Right;  70 mins  . WOUND DEBRIDEMENT N/A 01/04/2018   Procedure: IRRIGATION AND DEBRIDEMENT OF BUTTOCKS AND REMOVAL OF TICK FROM RIGHT TESTICLE;  Surgeon: Georganna Skeans, MD;  Location: [place];  Service: General;  Laterality: N/A;       Home Medications    Prior to Admission medications   Not on File    Family History Family History  Problem Relation Age of Onset  . Hypertension Mother   . Heart failure Mother   . Alzheimer's disease Mother   . Skin cancer Mother   . Heart disease Father   . Alzheimer's disease Maternal Grandmother   . Alzheimer's disease Maternal Grandfather     Social History Social History   Tobacco Use  . Smoking  status: Never Smoker  . Smokeless tobacco: Never Used  Vaping Use  . Vaping Use: Never used  Substance Use Topics  . Alcohol use: No  . Drug use: No     Allergies   Codeine and Oxycodone   Review of Systems Review of Systems  Constitutional: Positive for fatigue. Negative for fever.  Respiratory: Negative for cough, chest tightness and shortness of breath.   Gastrointestinal: Negative for abdominal pain.  Neurological: Positive for dizziness and light-headedness. Negative for speech difficulty, weakness, numbness and headaches.     Physical Exam Triage Vital Signs ED Triage Vitals  Enc Vitals Group     BP 10/04/20 1553 137/80     Pulse Rate 10/04/20 1553 81     Resp 10/04/20 1553 18     Temp 10/04/20 1553 98.1 F (36.7 C)     Temp Source 10/04/20 1553 Oral     SpO2 10/04/20 1553 97 %     Weight --      Height --      Head Circumference --      Peak Flow --      Pain Score 10/04/20 1554 0     Pain Loc --      Pain Edu? --      Excl. in [place]? --    Orthostatic VS for the past 24 hrs:  BP- Lying Pulse- Lying BP- Sitting Pulse- Sitting BP- Standing at 0 minutes Pulse- Standing at 0 minutes  10/04/20 1558 (!) 143/93 86 (!) 139/98 88 134/90 92    Updated Vital Signs BP 137/80 (BP Location: Left Arm)   Pulse 81   Temp 98.1 F (36.7 C) (Oral)   Resp 18   SpO2 97%   Visual Acuity Right Eye Distance:   Left Eye Distance:   Bilateral Distance:    Right Eye Near:   Left Eye Near:    Bilateral Near:     Physical Exam Vitals and nursing note reviewed.  Constitutional:      General: He is not in acute distress.    Appearance: He is not diaphoretic.  HENT:     Head: Normocephalic and atraumatic.     Right Ear: Tympanic membrane, ear canal and external ear normal.     Left Ear: Tympanic membrane, ear canal and external ear normal.  Eyes:     General: No visual field deficit.    Extraocular Movements: Extraocular movements intact.     Right eye: Normal  extraocular motion.     Left eye: Normal extraocular motion.     Conjunctiva/sclera: Conjunctivae normal.  Cardiovascular:     Pulses: Normal pulses.     Heart sounds: Normal heart sounds.  Pulmonary:     Effort: Pulmonary effort is normal.     Breath sounds: Normal breath sounds.  Musculoskeletal:        General: Normal range of  motion.     Cervical back: Normal range of motion.  Skin:    General: Skin is warm and dry.  Neurological:     General: No focal deficit present.     Mental Status: He is alert and oriented to person, place, and time.     GCS: GCS eye subscore is 4. GCS verbal subscore is 5. GCS motor subscore is 6.     Cranial Nerves: Cranial nerves are intact.     Sensory: Sensation is intact.     Coordination: Romberg sign negative. Finger-Nose-Finger Test normal.  Psychiatric:        Mood and Affect: Mood normal.      UC Treatments / Results  Labs (all labs ordered are listed, but only abnormal results are displayed) Labs Reviewed  COMPREHENSIVE METABOLIC PANEL  CBC WITH DIFFERENTIAL/PLATELET    EKG   Radiology No results found.  Procedures Procedures (including critical care time)  Medications Ordered in UC Medications - No data to display  Initial Impression / Assessment and Plan / UC Course  I have reviewed the triage vital signs and the nursing notes.  Pertinent labs & imaging results that were available during my care of the patient were reviewed by me and considered in my medical decision making (see chart for details).     Dizziness and Fatigue No focal neuro deficits on exam.   However, based on history and complaints of generalized fatigue and dizziness, patient would benefit from further evaluation at a higher level of care.  Discussed concerns with patient but he does not wish to go to the emergency department at this time.   CBC and CMP obtained and pending.  Will contact with results of lab work.   Patient will go to the emergency  department with any worsening symptoms including increased dizziness, shortness of breath, chest pain, loss of consciousness, nausea/vomiting, headaches, numbness, or blurred vision.    Final Clinical Impressions(s) / UC Diagnoses   Final diagnoses:  Dizziness  Fatigue, unspecified type     Discharge Instructions     We will contact you with the results from your lab work.    Drink lots of fluids and rest.    Go to the Emergency Department, if you are having increased dizziness, loss of consciousness, increased fatigue or weakness, headache, numbness, or blurred vision.      ED Prescriptions    None     PDMP not reviewed this encounter.   Pearson Forster, NP 10/04/20 [phone]

## 2020-10-04 NOTE — ED Triage Notes (Signed)
Pt c/o dizziness x3 days with fatigue. States dizziness worse on changing movement. Denies vomiting.

## 2020-10-04 NOTE — Telephone Encounter (Signed)
Patient is calling ot report he has been having dizzy spells for 2-3 days- he is having one now and wants to come to the office- advised no open appointment and needs to be seen- advised UC for evaluation.  Reason for Disposition . [1] MODERATE dizziness (e.g., interferes with normal activities) AND [2] has NOT been evaluated by physician for this  (Exception: dizziness caused by heat exposure, sudden standing, or poor fluid intake)  Answer Assessment - Initial Assessment Questions 1. DESCRIPTION: "Describe your dizziness."     Unstable- room moving 2. LIGHTHEADED: "Do you feel lightheaded?" (e.g., somewhat faint, woozy, weak upon standing)     almost 3. VERTIGO: "Do you feel like either you or the room is spinning or tilting?" (i.e. vertigo)     unsure 4. SEVERITY: "How bad is it?"  "Do you feel like you are going to faint?" "Can you stand and walk?"   - MILD: Feels slightly dizzy, but walking normally.   - MODERATE: Feels very unsteady when walking, but not falling; interferes with normal activities (e.g., school, work) .   - SEVERE: Unable to walk without falling, or requires assistance to walk without falling; feels like passing out now.      moderate 5. ONSET:  "When did the dizziness begin?"     2-3 days 6. AGGRAVATING FACTORS: "Does anything make it worse?" (e.g., standing, change in head position)     unsure 7. HEART RATE: "Can you tell me your heart rate?" "How many beats in 15 seconds?"  (Note: not all patients can do this)       No change noticed 8. CAUSE: "What do you think is causing the dizziness?"     unsure 9. RECURRENT SYMPTOM: "Have you had dizziness before?" If Yes, ask: "When was the last time?" "What happened that time?"     no 10. OTHER SYMPTOMS: "Do you have any other symptoms?" (e.g., fever, chest pain, vomiting, diarrhea, bleeding)       fatigue 11. PREGNANCY: "Is there any chance you are pregnant?" "When was your last menstrual period?"       n/a  Protocols  used: DIZZINESS [facility]

## 2020-10-05 LAB — COMPREHENSIVE METABOLIC PANEL
ALT: 16 IU/L (ref 0–44)
AST: 15 IU/L (ref 0–40)
Albumin/Globulin Ratio: 1.6 (ref 1.2–2.2)
Albumin: 4.1 g/dL (ref 3.8–4.8)
Alkaline Phosphatase: 73 IU/L (ref 44–121)
BUN/Creatinine Ratio: 17 (ref 10–24)
BUN: 17 mg/dL (ref 8–27)
Bilirubin Total: 0.5 mg/dL (ref 0.0–1.2)
CO2: 23 mmol/L (ref 20–29)
Calcium: 9.1 mg/dL (ref 8.6–10.2)
Chloride: 104 mmol/L (ref 96–106)
Creatinine, Ser: 1 mg/dL (ref 0.76–1.27)
Globulin, Total: 2.5 g/dL (ref 1.5–4.5)
Glucose: 88 mg/dL (ref 65–99)
Potassium: 4.5 mmol/L (ref 3.5–5.2)
Sodium: 141 mmol/L (ref 134–144)
Total Protein: 6.6 g/dL (ref 6.0–8.5)
eGFR: 83 mL/min/{1.73_m2} (ref >59)

## 2020-10-05 LAB — CBC WITH DIFFERENTIAL/PLATELET
Basophils Absolute: 0 10*3/uL (ref 0.0–0.2)
Basos: 1 %
EOS (ABSOLUTE): 0 10*3/uL (ref 0.0–0.4)
Eos: 1 %
Hematocrit: 43.1 % (ref 37.5–51.0)
Hemoglobin: 14.8 g/dL (ref 13.0–17.7)
Immature Grans (Abs): 0 10*3/uL (ref 0.0–0.1)
Immature Granulocytes: 0 %
Lymphocytes Absolute: 1.2 10*3/uL (ref 0.7–3.1)
Lymphs: 19 %
MCH: 30.1 pg (ref 26.6–33.0)
MCHC: 34.3 g/dL (ref 31.5–35.7)
MCV: 88 fL (ref 79–97)
Monocytes Absolute: 0.5 10*3/uL (ref 0.1–0.9)
Monocytes: 9 %
Neutrophils Absolute: 4.4 10*3/uL (ref 1.4–7.0)
Neutrophils: 70 %
Platelets: 248 10*3/uL (ref 150–450)
RBC: 4.91 x10E6/uL (ref 4.14–5.80)
RDW: 14.1 % (ref 11.6–15.4)
WBC: 6.2 10*3/uL (ref 3.4–10.8)

## 2020-10-05 NOTE — Telephone Encounter (Signed)
Please contact pt and schedule ED follow up with any available provider

## 2020-10-10 DIAGNOSIS — M25512 Pain in left shoulder: Secondary | ICD-10-CM | POA: Diagnosis not present

## 2020-10-11 NOTE — Telephone Encounter (Signed)
Called patient to schedule next available OV with any provider. Patient didn't answer, LVM advising him to call [phone] to schedule.

## 2020-10-18 ENCOUNTER — Telehealth: Payer: Self-pay | Admitting: Internal Medicine

## 2020-10-18 NOTE — Telephone Encounter (Signed)
Please contact pt and schedule a surgery clearance

## 2020-10-18 NOTE — Telephone Encounter (Signed)
Called Pt no answer. Left vm for Pt to call back to schedule appt.

## 2020-10-18 NOTE — Telephone Encounter (Signed)
Copied from [place] [phone]. Topic: General - Inquiry >> Oct 17, 2020  3:06 PM Lennox Solders wrote: Reason for CRM: Pt is calling to let dr Wynetta Emery [facility] will fax over medical clearance form to be completed. Pt needs shoulder surgery

## 2020-11-15 ENCOUNTER — Encounter: Payer: Self-pay | Admitting: Internal Medicine

## 2020-11-15 ENCOUNTER — Other Ambulatory Visit: Payer: Self-pay

## 2020-11-15 ENCOUNTER — Ambulatory Visit: Payer: Medicare HMO | Attending: Internal Medicine | Admitting: Internal Medicine

## 2020-11-15 VITALS — BP 126/85 | HR 112 | Resp 16 | Wt 230.5 lb

## 2020-11-15 DIAGNOSIS — Z8249 Family history of ischemic heart disease and other diseases of the circulatory system: Secondary | ICD-10-CM | POA: Insufficient documentation

## 2020-11-15 DIAGNOSIS — R42 Dizziness and giddiness: Secondary | ICD-10-CM | POA: Diagnosis not present

## 2020-11-15 DIAGNOSIS — D332 Benign neoplasm of brain, unspecified: Secondary | ICD-10-CM | POA: Insufficient documentation

## 2020-11-15 DIAGNOSIS — I110 Hypertensive heart disease with heart failure: Secondary | ICD-10-CM | POA: Insufficient documentation

## 2020-11-15 DIAGNOSIS — R519 Headache, unspecified: Secondary | ICD-10-CM

## 2020-11-15 DIAGNOSIS — Z6830 Body mass index (BMI) 30.0-30.9, adult: Secondary | ICD-10-CM | POA: Insufficient documentation

## 2020-11-15 DIAGNOSIS — G4733 Obstructive sleep apnea (adult) (pediatric): Secondary | ICD-10-CM | POA: Insufficient documentation

## 2020-11-15 DIAGNOSIS — I498 Other specified cardiac arrhythmias: Secondary | ICD-10-CM | POA: Diagnosis not present

## 2020-11-15 DIAGNOSIS — Z01818 Encounter for other preprocedural examination: Secondary | ICD-10-CM

## 2020-11-15 DIAGNOSIS — I5032 Chronic diastolic (congestive) heart failure: Secondary | ICD-10-CM

## 2020-11-15 DIAGNOSIS — Z86018 Personal history of other benign neoplasm: Secondary | ICD-10-CM | POA: Diagnosis not present

## 2020-11-15 DIAGNOSIS — E785 Hyperlipidemia, unspecified: Secondary | ICD-10-CM | POA: Diagnosis not present

## 2020-11-15 MED ORDER — FUROSEMIDE 20 MG PO TABS: 20.0000 mg | ORAL_TABLET | Freq: Every day | ORAL | 3 refills | Status: DC | PRN

## 2020-11-15 NOTE — Patient Instructions (Signed)
Please return to the lab this afternoon or tomorrow to have blood test done to check your electrolytes and thyroid level. I have referred you for follow-up with your cardiologist for further evaluation of abnormal heart rhythm. I have placed you on a low-dose of furosemide which is a fluid pill to use as needed for swelling in the legs.  We have ordered an MRI of your brain.  My medical assistant will call you with the appointment date and time to have that done. In the meantime I recommend that you go slow with position changes.  Please contact your neurosurgeon and schedule a follow-up appointment given the recent onset of dizziness and headache.

## 2020-11-15 NOTE — Progress Notes (Signed)
Patient ID: Brandon Marks, male    DOB: Feb 06, 1956  MRN: [id number]  CC: Medical Clearance and Dizziness   Subjective: Brandon Marks is a 65 y.o. male who presents for chronic ds management.  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, wgh loss surgery (Roux-en-Y)07/2017 at [facility]), BL TKR.  Patient presents today to have preoperative evaluation for rotator cuff surgery on his right shoulder by [facility].  -He has had several surgeries in the past including knee replacement surgeries and gastric bypass.  Denies any problems with anesthesia or waking up from anesthesia during past surgeries.  He has mild sleep apnea for which she is not followed through on having titration study done.  Denies any significant daytime sleepiness at this time. -Not very active.  Not sure if he can walk 2 or 3 blocks.  Lately he has been having intermittent dizziness for the past 2 to 3 weeks.  Episodes can last about a minute and occurs several times a day.  Not really related to position changes and occurs more so when he is walking around.  No dizziness when lying in bed.  He has not had any falls in the past few months.  Also having right-sided headache x1 month that occurs 2-3 times a week and last several seconds.  He describes it "as a toothache in my head."  No associated blurred vision, photophobia or nausea/vomiting Endorses ringing in the RT ear x few mths   He has history of HTN and CHF but was taken off lisinopril/hydrochlorothiazide by cardiology after his surgery for the spine normal last year.   No palpitions.  No CP/SOB. No LE edema, no PND. Sleeps on 1 pillow Eating okay.  Not drinking enough water.  Drinks sweet tea and coffee  OSA: never got 2nd part of sleep study.  However he reports that he has a CPAP machine at home but has not been using it.    Patient Active Problem List   Diagnosis Date Noted  . Benign neoplasm of brain, unspecified brain region [facility]) 11/15/2020  . Status post excision of acoustic neuroma 02/07/2020  . S/P right TKA 09/13/2019  . S/P left TKA 08/11/2019  . Genu varum of both lower extremities 06/13/2019  . Vitamin B12 deficiency 06/13/2019  . Obesity (BMI 30-39.9) 12/27/2018  . 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 Tinnitus of right ear 12/27/2018  . Gait disturbance 12/27/2018  . Normochromic anemia 12/27/2018  . Primary osteoarthritis of both knees 12/27/2018  . Diastolic heart failure ([place]) 09/11/2017  . DDD (degenerative disc disease), cervical 09/04/2017  . Dyspnea, chronic DOE 02/27/2012  . Herniated disc   . Hyperlipidemia 02/23/2009  . 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 Essential hypertension 10/18/2007  . OSA on CPAP 10/11/2007     No current outpatient medications on file prior to visit.   No current facility-administered medications on file prior to visit.    Allergies  Allergen Reactions  . Codeine Nausea And Vomiting  . Oxycodone Other (See Comments)    Upset GI    Social History   Socioeconomic History  . Marital status: Divorced    Spouse name: Not on file  . Number of children: 0  . Years of education: some college  . Highest education level: Not on file  Occupational History  . Occupation: Retired  Tobacco Use  . Smoking status: Never Smoker  . Smokeless tobacco: Never Used  Vaping Use  . Vaping Use: Never used  Substance and Sexual Activity  . Alcohol use: No  . Drug use: No  . Sexual activity: Not on file  Other Topics Concern  . Not on file  Social History Narrative   Denies caffeine use    Social Determinants of Health   Financial Resource Strain: Not on file  Food Insecurity: Not on file  Transportation Needs: Not on file  Physical Activity: Not on file  Stress: Not on file  Social Connections: Not on file  Intimate  Partner Violence: Not on file    Family History  Problem Relation Age of Onset  . Hypertension Mother   . Heart failure Mother   . Alzheimer's disease Mother   . Skin cancer Mother   . Heart disease Father   . Alzheimer's disease Maternal Grandmother   . Alzheimer's disease Maternal Grandfather     Past Surgical History:  Procedure Laterality Date  . 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 at [facility]    . CARDIAC CATHETERIZATION  02/20/2009   normal coronary arteries  . EYE SURGERY     bilateral cataract with lens implant  . INCISION AND DRAINAGE PERIRECTAL ABSCESS N/A 01/02/2018   Procedure: IRRIGATION AND DEBRIDEMENT PERIRECTAL ABSCESS;  Surgeon: Coralie Keens, MD;  Location: [place];  Service: General;  Laterality: N/A;  . IRRIGATION AND DEBRIDEMENT BUTTOCKS N/A 01/05/2018   Procedure: DEBRIDEMENT BUTTOCKS ABSCESS;  Surgeon: Georganna Skeans, MD;  Location: [place];  Service: General;  Laterality: N/A;  . KNEE ARTHROSCOPY  2010   Lt  . NM MYOCAR PERF WALL MOTION  09/28/2007   small area of reversibility in the anterolateral wall at the apex concerning for ischemia  . SHOULDER ARTHROSCOPY  4/12   Rt  . TOTAL KNEE ARTHROPLASTY Left 08/11/2019   Procedure: TOTAL KNEE ARTHROPLASTY;  Surgeon: Paralee Cancel, MD;  Location: [facility];  Service: Orthopedics;  Laterality: Left;  70 mins  . TOTAL KNEE ARTHROPLASTY Right 09/13/2019   Procedure: TOTAL KNEE ARTHROPLASTY;  Surgeon: Paralee Cancel, MD;  [facility];  Service: Orthopedics;  Laterality: Right;  70 mins  . WOUND DEBRIDEMENT N/A 01/04/2018   Procedure: IRRIGATION AND DEBRIDEMENT OF BUTTOCKS AND REMOVAL OF TICK FROM RIGHT TESTICLE;  Surgeon: Georganna Skeans, MD;  Location: [place];  Service: General;  Laterality: N/A;    ROS: Review of Systems Negative except as stated above  PHYSICAL EXAM: BP 126/85   Pulse (!) 112   Resp 16   Wt 230 lb 8 oz (104.6 kg)   SpO2 100%   BMI 29.59 kg/m   Wt Readings from Last 3 Encounters:  11/15/20 230 lb 8  oz (104.6 kg)  02/07/20 225 lb (102.1 kg)  10/27/19 225 lb (102.1 kg)    Physical Exam  General appearance - alert, Caucasian male who looks older than stated age.  He is in no acute distress.   Mental status - normal mood, behavior, speech, dress, motor activity, and thought processes Eyes - pupils equal and reactive, extraocular eye movements intact Ears -moderate hard wax buildup in the right ear obscuring view of the tympanic membrane and the canal.  Left ear canal and tympanic membrane within normal limits. Nose - normal and patent, no erythema, discharge or polyps Mouth - mucous membranes moist, pharynx normal without lesions Neck - supple, no significant adenopathy Chest - clear to auscultation, no wheezes, rales or  rhonchi, symmetric air entry Heart -heart rate is a little tachycardic at times and sounds irregular.  Difficult to detect whether he has JVD as patient was unable to lay his head back Abdomen - soft, nontender, nondistended, no masses or organomegaly Neurological -he ambulates unassisted with a stooped gait.  Cranial nerves appear grossly intact.  Power 5/5 throughout in both upper and lower extremities.  Gross sensation intact.  Extremities -trace to 1+ lower extremity and ankle edema BL  CMP Latest Ref Rng & Units 10/04/2020 10/27/2019 09/15/2019  Glucose 65 - 99 mg/dL 88 85 84  BUN 8 - 27 mg/dL 17 15 23   Creatinine 0.76 - 1.27 mg/dL 1.00 0.76 0.71  Sodium 134 - 144 mmol/L 141 138 137  Potassium 3.5 - 5.2 mmol/L 4.5 3.6 4.2  Chloride 96 - 106 mmol/L 104 103 104  CO2 20 - 29 mmol/L 23 28 25   Calcium 8.6 - 10.2 mg/dL 9.1 8.8(L) 8.6(L)  Total Protein 6.0 - 8.5 g/dL 6.6 6.8 -  Total Bilirubin 0.0 - 1.2 mg/dL 0.5 0.6 -  Alkaline Phos 44 - 121 IU/L 73 47 -  AST 0 - 40 IU/L 15 12(L) -  ALT 0 - 44 IU/L 16 10 -   Lipid Panel     Component Value Date/Time   CHOL 99 (L) 12/27/2018 1144   TRIG 43 12/27/2018 1144   HDL 49 12/27/2018 1144   CHOLHDL 2.0 12/27/2018 1144    LDLCALC 41 12/27/2018 1144    CBC    Component Value Date/Time   WBC 6.2 10/04/2020 1635   WBC 7.2 10/27/2019 2037   RBC 4.91 10/04/2020 1635   RBC 3.83 (L) 10/27/2019 2037   HGB 14.8 10/04/2020 1635   HCT 43.1 10/04/2020 1635   PLT 248 10/04/2020 1635   MCV 88 10/04/2020 1635   MCH 30.1 10/04/2020 1635   MCH 30.3 10/27/2019 2037   MCHC 34.3 10/04/2020 1635   MCHC 32.6 10/27/2019 2037   RDW 14.1 10/04/2020 1635   LYMPHSABS 1.2 10/04/2020 1635   MONOABS 0.8 10/27/2019 2037   EOSABS 0.0 10/04/2020 1635   BASOSABS 0.0 10/04/2020 1635   EKG: Patient seems to have flutter waves in lead II with a rate of 106 however the rate does not appear to be regular.  QT is not prolonged.  No acute ischemic changes noted.  ASSESSMENT AND PLAN: 1. Preoperative evaluation to rule out surgical contraindication I will hold off on clearing him for surgery as he has some issues going on that needs to be resolved and worked up.  We will refer him to cardiology for further evaluation of the heart rhythm and will check chemistry to rule out electrolyte abnormalities.  2. Arrhythmia, atrial We will try to get him in with cardiology as soon as possible. - Basic Metabolic Panel; Future - TSH+T4F+T3Free; Future  3. Chronic diastolic heart failure (HCC) Lungs are clear but he does have some lower extremity edema.  I have given a low-dose of furosemide to use as needed. - Ambulatory referral to Cardiology - furosemide (LASIX) 20 MG tablet; Take 1 tablet (20 mg total) by mouth daily as needed for edema (for swelling in legs/ankle).  Dispense: 30 tablet; Refill: 3  4. Dizziness 5. Headache disorder Given his history of vestibular schwannoma that was resected last year and his current symptoms, I will get an MRI of his head.  I recommend referral to neurology.  Patient declined he prefers to follow-up with his neurosurgeon.  He states  he will give them a call tomorrow to try to get in to be seen. -Meclizine  to use as needed in the meantime. - MR Brain W Wo Contrast; Future  6. History of acoustic neuroma - MR Brain W Wo Contrast; Future  Follow-up with me in several weeks after being seen by the cardiologist and the neurosurgeon.   Patient was given the opportunity to ask questions.  Patient verbalized understanding of the plan and was able to repeat key elements of the plan.   Orders Placed This Encounter  Procedures  . MR Brain W Wo Contrast  . Basic Metabolic Panel  . TSH+T4F+T3Free  . Ambulatory referral to Cardiology     Requested Prescriptions   Signed Prescriptions Disp Refills  . furosemide (LASIX) 20 MG tablet 30 tablet 3    Sig: Take 1 tablet (20 mg total) by mouth daily as needed for edema (for swelling in legs/ankle).    Return in about 4 weeks (around 12/13/2020).  Karle Plumber, MD, FACP

## 2020-11-16 ENCOUNTER — Telehealth: Payer: Self-pay

## 2020-11-16 ENCOUNTER — Ambulatory Visit: Payer: Medicare HMO | Attending: Internal Medicine

## 2020-11-16 DIAGNOSIS — I498 Other specified cardiac arrhythmias: Secondary | ICD-10-CM | POA: Diagnosis not present

## 2020-11-16 NOTE — Telephone Encounter (Signed)
Went on the [employer] to start prior auth for MRI K[facility] Contrast   CPT- 57473 MRI Brain W Wo Contrast ICD- R42 Dizziness         R51.9 headache disorder        Z86.018 History of acoustic neuroma  Provided all information on [facility]. Additonal Clinical information is needed. Printed ov note and xray report.  Faxed clinical information   [facility] Tracking # [id number]

## 2020-11-17 LAB — BASIC METABOLIC PANEL
BUN/Creatinine Ratio: 14 (ref 10–24)
BUN: 15 mg/dL (ref 8–27)
CO2: 23 mmol/L (ref 20–29)
Calcium: 9.1 mg/dL (ref 8.6–10.2)
Chloride: 104 mmol/L (ref 96–106)
Creatinine, Ser: 1.07 mg/dL (ref 0.76–1.27)
Glucose: 113 mg/dL — ABNORMAL HIGH (ref 65–99)
Potassium: 3.9 mmol/L (ref 3.5–5.2)
Sodium: 142 mmol/L (ref 134–144)
eGFR: 77 mL/min/{1.73_m2} (ref >59)

## 2020-11-17 LAB — TSH+T4F+T3FREE
Free T4: 1.31 ng/dL (ref 0.82–1.77)
T3, Free: 2.4 pg/mL (ref 2.0–4.4)
TSH: 1.19 u[IU]/mL (ref 0.450–4.500)

## 2020-11-19 ENCOUNTER — Telehealth: Payer: Self-pay

## 2020-11-19 NOTE — Telephone Encounter (Signed)
Contacted pt to go over lab results pt is aware and doesn't have any questions or concerns 

## 2020-11-30 ENCOUNTER — Ambulatory Visit (HOSPITAL_COMMUNITY): Admission: RE | Admit: 2020-11-30 | Payer: Medicare HMO | Source: Ambulatory Visit

## 2020-12-07 ENCOUNTER — Ambulatory Visit: Payer: Medicare HMO | Admitting: Physician Assistant

## 2020-12-11 NOTE — Progress Notes (Deleted)
Cardiology Clinic Note   Patient Name: Brandon Marks Date of Encounter: 12/11/2020  Primary Care Provider:  Ladell Pier, MD Primary Cardiologist:  Sanda Klein, MD  Patient Profile    Brandon Marks. Brandon Marks 65 year old male presents the clinic today for follow-up evaluation of his chronic diastolic CHF.  Past Medical History    Past Medical History:  Diagnosis Date  . Arthritis   . Herniated disc   . High cholesterol   . Hypertension   . Morbid obesity ([place])   . PAC (premature atrial contraction)   . Sleep apnea    non compliant with c-pap   Past Surgical History:  Procedure Laterality Date  . 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 at [facility]    . CARDIAC CATHETERIZATION  02/20/2009   normal coronary arteries  . EYE SURGERY     bilateral cataract with lens implant  . INCISION AND DRAINAGE PERIRECTAL ABSCESS N/A 01/02/2018   Procedure: IRRIGATION AND DEBRIDEMENT PERIRECTAL ABSCESS;  Surgeon: Coralie Keens, MD;  Location: [place];  Service: General;  Laterality: N/A;  . IRRIGATION AND DEBRIDEMENT BUTTOCKS N/A 01/05/2018   Procedure: DEBRIDEMENT BUTTOCKS ABSCESS;  Surgeon: Georganna Skeans, MD;  Location: [place];  Service: General;  Laterality: N/A;  . KNEE ARTHROSCOPY  2010   Lt  . NM MYOCAR PERF WALL MOTION  09/28/2007   small area of reversibility in the anterolateral wall at the apex concerning for ischemia  . SHOULDER ARTHROSCOPY  4/12   Rt  . TOTAL KNEE ARTHROPLASTY Left 08/11/2019   Procedure: TOTAL KNEE ARTHROPLASTY;  Surgeon: Paralee Cancel, MD;  [facility];  Service: Orthopedics;  Laterality: Left;  70 mins  . TOTAL KNEE ARTHROPLASTY Right 09/13/2019   Procedure: TOTAL KNEE ARTHROPLASTY;  Surgeon: Paralee Cancel, MD;  [facility];  Service: Orthopedics;  Laterality: Right;  70 mins  . WOUND DEBRIDEMENT N/A 01/04/2018   Procedure: IRRIGATION AND DEBRIDEMENT OF BUTTOCKS AND REMOVAL OF TICK FROM RIGHT TESTICLE;  Surgeon: Georganna Skeans, MD;  Location: [place];  Service:  General;  Laterality: N/A;    Allergies  Allergies  Allergen Reactions  . Codeine Nausea And Vomiting  . Oxycodone Other (See Comments)    Upset GI    History of Present Illness    Brandon Marks has a PMH of essential hypertension, chronic diastolic CHF, OSA on CPAP, benign neoplasm of brain, herniated disc, hyperlipidemia, obesity, left/right TKA, morbid obesity, and status post excision of acoustic neuroma.  He underwent cardiac catheterization on 2010 which showed normal coronary angiography.  He underwent cardiac catheterization after a false positive nuclear stress test in anticipation for his bariatric surgery.  He was noted to have a functional class II exertional dyspnea and chronic moderate lower extremity swelling.  He underwent stress testing at 6/18 which was low risk and negative for ischemia.  He was seen by Brandon Dopp, PA-C 2/19 for preoperative clearance.  He was cleared for gastric bypass surgery.  His surgery was performed 2/19.  He was readmitted 6/19 with necrotizing fasciitis of the right buttock.  He underwent I&D with debridement of the area and was treated with antibiotic for septic shock.  A tick was found on him operating room.  He was treated with prophylactic doxycycline.  He was noted to have acute kidney injury as a result of his septic shock.  His creatinine rose to 2.1.  It then improved to 0.89 with rehydration.  When he saw [place] PA-C 03/14/2019 he had lost around 140 pounds  after his bariatric surgery.  He denied any further infection problems.  His blood pressure was well controlled.  He did not have any lower extremity edema, orthopnea, or PND.  He denied chest discomfort and shortness of breath.  He presents to the clinic today for follow-up evaluation states***  *** denies chest pain, shortness of breath, lower extremity edema, fatigue, palpitations, melena, hematuria, hemoptysis, diaphoresis, weakness, presyncope, syncope, orthopnea, and PND.     Home  Medications    Prior to Admission medications   Medication Sig Start Date End Date Taking? Authorizing Provider  furosemide (LASIX) 20 MG tablet Take 1 tablet (20 mg total) by mouth daily as needed for edema (for swelling in legs/ankle). 11/15/20   Ladell Pier, MD    Family History    Family History  Problem Relation Age of Onset  . Hypertension Mother   . Heart failure Mother   . Alzheimer's disease Mother   . Skin cancer Mother   . Heart disease Father   . Alzheimer's disease Maternal Grandmother   . Alzheimer's disease Maternal Grandfather    He indicated that his mother is alive. He indicated that his father is deceased. He indicated that his brother is alive. He indicated that his maternal grandmother is deceased. He indicated that his maternal grandfather is deceased. He indicated that his paternal grandmother is deceased. He indicated that his paternal grandfather is deceased.  Social History    Social History   Socioeconomic History  . Marital status: Divorced    Spouse name: Not on file  . Number of children: 0  . Years of education: some college  . Highest education level: Not on file  Occupational History  . Occupation: Retired  Tobacco Use  . Smoking status: Never Smoker  . Smokeless tobacco: Never Used  Vaping Use  . Vaping Use: Never used  Substance and Sexual Activity  . Alcohol use: No  . Drug use: No  . Sexual activity: Not on file  Other Topics Concern  . Not on file  Social History Narrative   Denies caffeine use    Social Determinants of Health   Financial Resource Strain: Not on file  Food Insecurity: Not on file  Transportation Needs: Not on file  Physical Activity: Not on file  Stress: Not on file  Social Connections: Not on file  Intimate Partner Violence: Not on file     Review of Systems    General:  No chills, fever, night sweats or weight changes.  Cardiovascular:  No chest pain, dyspnea on exertion, edema, orthopnea,  palpitations, paroxysmal nocturnal dyspnea. Dermatological: No rash, lesions/masses Respiratory: No cough, dyspnea Urologic: No hematuria, dysuria Abdominal:   No nausea, vomiting, diarrhea, bright red blood per rectum, melena, or hematemesis Neurologic:  No visual changes, wkns, changes in mental status. All other systems reviewed and are otherwise negative except as noted above.  Physical Exam    VS:  There were no vitals taken for this visit. , BMI There is no height or weight on file to calculate BMI. GEN: Well nourished, well developed, in no acute distress. HEENT: normal. Neck: Supple, no JVD, carotid bruits, or masses. Cardiac: RRR, no murmurs, rubs, or gallops. No clubbing, cyanosis, edema.  Radials/DP/PT 2+ and equal bilaterally.  Respiratory:  Respirations regular and unlabored, clear to auscultation bilaterally. GI: Soft, nontender, nondistended, BS + x 4. MS: no deformity or atrophy. Skin: warm and dry, no rash. Neuro:  Strength and sensation are intact. Psych: Normal affect.  Accessory Clinical Findings    Recent Labs: 10/04/2020: ALT 16; Hemoglobin 14.8; Platelets 248 11/16/2020: BUN 15; Creatinine, Ser 1.07; Potassium 3.9; Sodium 142; TSH 1.190   Recent Lipid Panel    Component Value Date/Time   CHOL 99 (L) 12/27/2018 1144   TRIG 43 12/27/2018 1144   HDL 49 12/27/2018 1144   CHOLHDL 2.0 12/27/2018 1144   LDLCALC 41 12/27/2018 1144    ECG personally reviewed by me today- *** - No acute changes  Nuclear stress test 12/31/2016  Nuclear stress EF: 62%.  There was no ST segment deviation noted during stress.  This is a low risk study.  The left ventricular ejection fraction is normal (55-65%).   1. EF 62%, normal wall motion.  2. Fixed small, mild mid anteroseptal and apical septal perfusion defect.  No evidence for ischemia.  Given normal wall motion, most likely attenuation.  Cannot rule out prior infarction.   Low risk study   Assessment & Plan   1.   Chronic diastolic CHF-euvolemic today.  No increased DOE or activity intolerance.  Reports he is more physically active.  Recent labs stable. Continue furosemide Heart healthy low-sodium diet-salty 6 given Maintain physical activity as tolerated  Essential hypertension-BP today***.  Well-controlled at home. Continue furosemide Heart healthy low-sodium diet-salty 6 given Maintain physical activity as tolerated  Hyperlipidemia-LDL 41 on 12/27/2018 Heart healthy low-sodium high-fiber diet Maintain physical activity as tolerated Order fasting lipids  Morbid obesity-weight today***.  Successfully underwent bariatric surgery. Heart healthy low-sodium diet-salty 6 given Maintain physical activity as tolerated   Disposition: Follow-up with Dr. Sallyanne Kuster in 1 year.  Jossie Ng. Regla Fitzgibbon NP-C    12/11/2020, 7:26 AM [place] [address] Office [phone] Fax [phone]  Notice: This dictation was prepared with Dragon dictation along with smaller phrase technology. Any transcriptional errors that result from this process are unintentional and may not be corrected upon review.  I spent***minutes examining this patient, reviewing medications, and using patient centered shared decision making involving her cardiac care.  Prior to her visit I spent greater than 20 minutes reviewing her past medical history,  medications, and prior cardiac tests.

## 2020-12-12 ENCOUNTER — Ambulatory Visit: Payer: Medicare HMO | Admitting: General Practice

## 2020-12-17 ENCOUNTER — Other Ambulatory Visit: Payer: Self-pay

## 2020-12-17 ENCOUNTER — Ambulatory Visit (HOSPITAL_COMMUNITY)
Admission: RE | Admit: 2020-12-17 | Discharge: 2020-12-17 | Disposition: A | Discharge status: Home / Self Care | Payer: Medicare HMO | Source: Ambulatory Visit | Attending: Internal Medicine | Admitting: Internal Medicine

## 2020-12-17 DIAGNOSIS — R519 Headache, unspecified: Secondary | ICD-10-CM

## 2020-12-17 DIAGNOSIS — R42 Dizziness and giddiness: Secondary | ICD-10-CM

## 2020-12-17 DIAGNOSIS — Z86018 Personal history of other benign neoplasm: Secondary | ICD-10-CM | POA: Diagnosis not present

## 2020-12-17 DIAGNOSIS — G919 Hydrocephalus, unspecified: Secondary | ICD-10-CM | POA: Diagnosis not present

## 2020-12-17 MED ORDER — GADOBUTROL 1 MMOL/ML IV SOLN
10.0000 mL | Freq: Once | INTRAVENOUS | Status: AC | PRN
  Administered 2020-12-17: 10 mL via INTRAVENOUS

## 2020-12-18 ENCOUNTER — Telehealth: Payer: Self-pay

## 2020-12-18 NOTE — Telephone Encounter (Signed)
Opal Sidles with [facility] calling with call report on Brain MRI."New hydrocephalus". No answer in the practice - FC line. Several attempts.

## 2020-12-19 ENCOUNTER — Telehealth: Payer: Self-pay | Admitting: Internal Medicine

## 2020-12-19 DIAGNOSIS — Z01818 Encounter for other preprocedural examination: Secondary | ICD-10-CM

## 2020-12-19 DIAGNOSIS — I5032 Chronic diastolic (congestive) heart failure: Secondary | ICD-10-CM

## 2020-12-19 NOTE — Telephone Encounter (Signed)
Provider has contacted pt regarding results

## 2020-12-19 NOTE — Telephone Encounter (Signed)
Phone call placed to patient today to review the results of the MRI of his head. Patient told that it showed no enlargement of the ventricles in the brain which we called hydrocephalus.  There may also be residual tumor from the schwannoma.  I inquired whether he has scheduled a follow-up appointment with his neurosurgeon as yet.  He states he has an appointment with his neurosurgeon at [facility] tomorrow.  I told him that they should be able to see the report in the system but to be on the safe side he can stop by the office today to pick up a copy of the report to take with him. I also note that he did not show up for the cardiology appointment which was scheduled for the 18th of this month.  He tells me he did not go and he thinks it was rescheduled.  However I do not see a follow-up appointment in the system.  He is agreeable for me resubmitting the referral.  I have encouraged him to keep the appointment.

## 2020-12-25 DIAGNOSIS — D333 Benign neoplasm of cranial nerves: Secondary | ICD-10-CM | POA: Diagnosis not present

## 2020-12-25 DIAGNOSIS — H818X1 Other disorders of vestibular function, right ear: Secondary | ICD-10-CM | POA: Diagnosis not present

## 2020-12-25 DIAGNOSIS — H918X9 Other specified hearing loss, unspecified ear: Secondary | ICD-10-CM | POA: Diagnosis not present

## 2021-01-02 ENCOUNTER — Encounter: Payer: Self-pay | Admitting: Cardiovascular Disease

## 2021-01-12 DIAGNOSIS — Z20822 Contact with and (suspected) exposure to covid-19: Secondary | ICD-10-CM | POA: Diagnosis not present

## 2021-01-12 DIAGNOSIS — I4891 Unspecified atrial fibrillation: Secondary | ICD-10-CM | POA: Diagnosis not present

## 2021-01-12 DIAGNOSIS — R42 Dizziness and giddiness: Secondary | ICD-10-CM | POA: Diagnosis not present

## 2021-01-12 DIAGNOSIS — G919 Hydrocephalus, unspecified: Secondary | ICD-10-CM | POA: Diagnosis not present

## 2021-01-12 DIAGNOSIS — G4733 Obstructive sleep apnea (adult) (pediatric): Secondary | ICD-10-CM | POA: Diagnosis not present

## 2021-01-12 DIAGNOSIS — I110 Hypertensive heart disease with heart failure: Secondary | ICD-10-CM | POA: Diagnosis not present

## 2021-01-12 DIAGNOSIS — I5030 Unspecified diastolic (congestive) heart failure: Secondary | ICD-10-CM | POA: Diagnosis not present

## 2021-01-12 DIAGNOSIS — E785 Hyperlipidemia, unspecified: Secondary | ICD-10-CM | POA: Diagnosis not present

## 2021-01-12 DIAGNOSIS — G9389 Other specified disorders of brain: Secondary | ICD-10-CM | POA: Diagnosis not present

## 2021-01-13 DIAGNOSIS — E785 Hyperlipidemia, unspecified: Secondary | ICD-10-CM | POA: Diagnosis not present

## 2021-01-13 DIAGNOSIS — D333 Benign neoplasm of cranial nerves: Secondary | ICD-10-CM | POA: Diagnosis not present

## 2021-01-13 DIAGNOSIS — I5030 Unspecified diastolic (congestive) heart failure: Secondary | ICD-10-CM | POA: Diagnosis not present

## 2021-01-13 DIAGNOSIS — I110 Hypertensive heart disease with heart failure: Secondary | ICD-10-CM | POA: Diagnosis not present

## 2021-01-13 DIAGNOSIS — R42 Dizziness and giddiness: Secondary | ICD-10-CM | POA: Diagnosis not present

## 2021-01-13 DIAGNOSIS — I4891 Unspecified atrial fibrillation: Secondary | ICD-10-CM | POA: Diagnosis not present

## 2021-01-13 DIAGNOSIS — Z20822 Contact with and (suspected) exposure to covid-19: Secondary | ICD-10-CM | POA: Diagnosis not present

## 2021-01-13 DIAGNOSIS — G9389 Other specified disorders of brain: Secondary | ICD-10-CM | POA: Diagnosis not present

## 2021-01-13 DIAGNOSIS — G4733 Obstructive sleep apnea (adult) (pediatric): Secondary | ICD-10-CM | POA: Diagnosis not present

## 2021-01-13 DIAGNOSIS — G919 Hydrocephalus, unspecified: Secondary | ICD-10-CM | POA: Diagnosis not present

## 2021-01-14 ENCOUNTER — Ambulatory Visit: Payer: Medicare HMO | Admitting: Internal Medicine

## 2021-01-14 DIAGNOSIS — R489 Unspecified symbolic dysfunctions: Secondary | ICD-10-CM | POA: Diagnosis not present

## 2021-01-14 DIAGNOSIS — M1611 Unilateral primary osteoarthritis, right hip: Secondary | ICD-10-CM | POA: Diagnosis not present

## 2021-01-14 DIAGNOSIS — R42 Dizziness and giddiness: Secondary | ICD-10-CM | POA: Diagnosis not present

## 2021-01-14 DIAGNOSIS — G9389 Other specified disorders of brain: Secondary | ICD-10-CM | POA: Diagnosis not present

## 2021-01-14 DIAGNOSIS — E785 Hyperlipidemia, unspecified: Secondary | ICD-10-CM | POA: Diagnosis not present

## 2021-01-14 DIAGNOSIS — M5136 Other intervertebral disc degeneration, lumbar region: Secondary | ICD-10-CM | POA: Diagnosis not present

## 2021-01-14 DIAGNOSIS — I110 Hypertensive heart disease with heart failure: Secondary | ICD-10-CM | POA: Diagnosis not present

## 2021-01-14 DIAGNOSIS — Z20822 Contact with and (suspected) exposure to covid-19: Secondary | ICD-10-CM | POA: Diagnosis not present

## 2021-01-14 DIAGNOSIS — I5030 Unspecified diastolic (congestive) heart failure: Secondary | ICD-10-CM | POA: Diagnosis not present

## 2021-01-14 DIAGNOSIS — I4891 Unspecified atrial fibrillation: Secondary | ICD-10-CM | POA: Diagnosis not present

## 2021-01-14 DIAGNOSIS — G919 Hydrocephalus, unspecified: Secondary | ICD-10-CM | POA: Diagnosis not present

## 2021-01-14 DIAGNOSIS — G4733 Obstructive sleep apnea (adult) (pediatric): Secondary | ICD-10-CM | POA: Diagnosis not present

## 2021-01-15 DIAGNOSIS — I5030 Unspecified diastolic (congestive) heart failure: Secondary | ICD-10-CM | POA: Diagnosis not present

## 2021-01-15 DIAGNOSIS — G919 Hydrocephalus, unspecified: Secondary | ICD-10-CM | POA: Diagnosis not present

## 2021-01-15 DIAGNOSIS — E785 Hyperlipidemia, unspecified: Secondary | ICD-10-CM | POA: Diagnosis not present

## 2021-01-15 DIAGNOSIS — G910 Communicating hydrocephalus: Secondary | ICD-10-CM | POA: Diagnosis not present

## 2021-01-15 DIAGNOSIS — I10 Essential (primary) hypertension: Secondary | ICD-10-CM | POA: Diagnosis not present

## 2021-01-15 DIAGNOSIS — R489 Unspecified symbolic dysfunctions: Secondary | ICD-10-CM | POA: Diagnosis not present

## 2021-01-15 DIAGNOSIS — R42 Dizziness and giddiness: Secondary | ICD-10-CM | POA: Insufficient documentation

## 2021-01-15 DIAGNOSIS — G4733 Obstructive sleep apnea (adult) (pediatric): Secondary | ICD-10-CM | POA: Diagnosis not present

## 2021-01-15 DIAGNOSIS — Z20822 Contact with and (suspected) exposure to covid-19: Secondary | ICD-10-CM | POA: Diagnosis not present

## 2021-01-15 DIAGNOSIS — I4891 Unspecified atrial fibrillation: Secondary | ICD-10-CM | POA: Diagnosis not present

## 2021-01-15 DIAGNOSIS — G9389 Other specified disorders of brain: Secondary | ICD-10-CM | POA: Diagnosis not present

## 2021-01-15 DIAGNOSIS — I110 Hypertensive heart disease with heart failure: Secondary | ICD-10-CM | POA: Diagnosis not present

## 2021-01-18 DIAGNOSIS — G919 Hydrocephalus, unspecified: Secondary | ICD-10-CM | POA: Diagnosis not present

## 2021-01-18 DIAGNOSIS — I110 Hypertensive heart disease with heart failure: Secondary | ICD-10-CM | POA: Diagnosis not present

## 2021-01-18 DIAGNOSIS — G4733 Obstructive sleep apnea (adult) (pediatric): Secondary | ICD-10-CM | POA: Diagnosis not present

## 2021-01-18 DIAGNOSIS — H8191 Unspecified disorder of vestibular function, right ear: Secondary | ICD-10-CM | POA: Diagnosis not present

## 2021-01-18 DIAGNOSIS — M1990 Unspecified osteoarthritis, unspecified site: Secondary | ICD-10-CM | POA: Diagnosis not present

## 2021-01-18 DIAGNOSIS — I491 Atrial premature depolarization: Secondary | ICD-10-CM | POA: Diagnosis not present

## 2021-01-18 DIAGNOSIS — I4891 Unspecified atrial fibrillation: Secondary | ICD-10-CM | POA: Diagnosis not present

## 2021-01-18 DIAGNOSIS — I5032 Chronic diastolic (congestive) heart failure: Secondary | ICD-10-CM | POA: Diagnosis not present

## 2021-01-22 DIAGNOSIS — H8191 Unspecified disorder of vestibular function, right ear: Secondary | ICD-10-CM | POA: Diagnosis not present

## 2021-01-22 DIAGNOSIS — G4733 Obstructive sleep apnea (adult) (pediatric): Secondary | ICD-10-CM | POA: Diagnosis not present

## 2021-01-22 DIAGNOSIS — M1990 Unspecified osteoarthritis, unspecified site: Secondary | ICD-10-CM | POA: Diagnosis not present

## 2021-01-22 DIAGNOSIS — I4891 Unspecified atrial fibrillation: Secondary | ICD-10-CM | POA: Diagnosis not present

## 2021-01-22 DIAGNOSIS — I5032 Chronic diastolic (congestive) heart failure: Secondary | ICD-10-CM | POA: Diagnosis not present

## 2021-01-22 DIAGNOSIS — I491 Atrial premature depolarization: Secondary | ICD-10-CM | POA: Diagnosis not present

## 2021-01-22 DIAGNOSIS — G919 Hydrocephalus, unspecified: Secondary | ICD-10-CM | POA: Diagnosis not present

## 2021-01-22 DIAGNOSIS — I110 Hypertensive heart disease with heart failure: Secondary | ICD-10-CM | POA: Diagnosis not present

## 2021-01-24 DIAGNOSIS — G4733 Obstructive sleep apnea (adult) (pediatric): Secondary | ICD-10-CM | POA: Diagnosis not present

## 2021-01-24 DIAGNOSIS — H8191 Unspecified disorder of vestibular function, right ear: Secondary | ICD-10-CM | POA: Diagnosis not present

## 2021-01-24 DIAGNOSIS — I5032 Chronic diastolic (congestive) heart failure: Secondary | ICD-10-CM | POA: Diagnosis not present

## 2021-01-24 DIAGNOSIS — G919 Hydrocephalus, unspecified: Secondary | ICD-10-CM | POA: Diagnosis not present

## 2021-01-24 DIAGNOSIS — I4891 Unspecified atrial fibrillation: Secondary | ICD-10-CM | POA: Diagnosis not present

## 2021-01-24 DIAGNOSIS — M1990 Unspecified osteoarthritis, unspecified site: Secondary | ICD-10-CM | POA: Diagnosis not present

## 2021-01-24 DIAGNOSIS — I110 Hypertensive heart disease with heart failure: Secondary | ICD-10-CM | POA: Diagnosis not present

## 2021-01-24 DIAGNOSIS — I491 Atrial premature depolarization: Secondary | ICD-10-CM | POA: Diagnosis not present

## 2021-02-07 ENCOUNTER — Telehealth: Payer: Self-pay

## 2021-02-07 NOTE — Telephone Encounter (Signed)
After multiple attempts to reach [facility] about home health orders that Dr Wynetta Emery received, this CM received a call from [facility].  This CM explained that Dr Wynetta Emery did not place the referral for services and requested that the orders be sent to the provider who referred the patient. She requested that this CM fax/email her the documents that were received and she would address the issue.  Information then sent via [place] email to [email or web address]

## 2021-02-22 ENCOUNTER — Other Ambulatory Visit: Payer: Self-pay

## 2021-02-22 ENCOUNTER — Ambulatory Visit: Payer: Medicare Other | Attending: Internal Medicine | Admitting: Internal Medicine

## 2021-02-22 ENCOUNTER — Encounter: Payer: Self-pay | Admitting: Internal Medicine

## 2021-02-22 VITALS — BP 121/84 | HR 102 | Resp 16 | Wt 229.6 lb

## 2021-02-22 DIAGNOSIS — G910 Communicating hydrocephalus: Secondary | ICD-10-CM

## 2021-02-22 DIAGNOSIS — I498 Other specified cardiac arrhythmias: Secondary | ICD-10-CM

## 2021-02-22 DIAGNOSIS — I5032 Chronic diastolic (congestive) heart failure: Secondary | ICD-10-CM | POA: Diagnosis not present

## 2021-02-22 DIAGNOSIS — G9782 Other postprocedural complications and disorders of nervous system: Secondary | ICD-10-CM | POA: Diagnosis not present

## 2021-02-22 DIAGNOSIS — I10 Essential (primary) hypertension: Secondary | ICD-10-CM

## 2021-02-22 DIAGNOSIS — Z5320 Procedure and treatment not carried out because of patient's decision for unspecified reasons: Secondary | ICD-10-CM

## 2021-02-22 DIAGNOSIS — E663 Overweight: Secondary | ICD-10-CM

## 2021-02-22 DIAGNOSIS — Z2821 Immunization not carried out because of patient refusal: Secondary | ICD-10-CM

## 2021-02-22 DIAGNOSIS — Z9889 Other specified postprocedural states: Secondary | ICD-10-CM

## 2021-02-22 DIAGNOSIS — R269 Unspecified abnormalities of gait and mobility: Secondary | ICD-10-CM

## 2021-02-22 DIAGNOSIS — Z86018 Personal history of other benign neoplasm: Secondary | ICD-10-CM

## 2021-02-22 NOTE — Progress Notes (Signed)
Patient ID: Brandon Marks, male    DOB: 03-08-1956  MRN: [id number]  CC: Hypertension   Subjective: Brandon Marks is a 65 y.o. male who presents for chronic ds management.  Whitney, who states she is a family friend and assist him with his care, is with him  His concerns today include: Pt with hx of HTN, dCHF, OSA (has CPAP but not using.  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    I last saw him in April of this year.  He was complaining of some dizziness, headaches and ringing in the right ear.  I did an MRI of his head and it revealed findings consistent with hydrocephalus.  Subsequently seen by his neurosurgeon at [facility].  He tells me he was hospitalized x2 at [facility] since then.  He underwent a procedure for lumbar drain trial.  Initially the drain trial was unable to be completed in mid June while he was there because his mother passed while he was in the hospital and he ended up being discharged home.  Plan now is for VP shunt that is planned for sometime next month.   He feels that his dizziness and gait is a little bit better.  He has not had any falls.  He ambulates unassisted.  He is fairly independent in his ADLs.  He tells me that he will consider some physical therapy if needed after he has the VP shunt placed.  HTN/diastolic CHF: He tells me that he checks his blood pressure 1-2 times a week.  He does not recall his numbers but states that they have been good.  Lisinopril/HCTZ were discontinued by cardiology sometime last year.  He denies any shortness of breath, PND or increased lower extremity edema.  He tries to limit salt in the foods.  He takes furosemide only if he has swelling in the legs.  Refer to cardiology on last visit for possible a flutter seen on EKG.  They had tried contacting him several times but he did not return  their call.  He would like for me to resubmit the referral.  He is overweight for height but weight has remained stable.  He eats out a lot but states that even in doing so he chooses healthy foods  HM: He declines pneumonia vaccine, Shingrix and colon cancer screening.  Patient Active Problem List   Diagnosis Date Noted   Benign neoplasm of brain, unspecified brain region [facility]) 11/15/2020   Status post excision of acoustic neuroma 02/07/2020   S/P right TKA 09/13/2019   S/P left TKA 08/11/2019   Genu varum of both lower extremities 06/13/2019   Vitamin B12 deficiency 06/13/2019   Obesity (BMI 30-39.9) 12/27/2018   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   Tinnitus of right ear 12/27/2018   Gait disturbance 12/27/2018   Normochromic anemia 12/27/2018   Primary osteoarthritis of both knees [id number]   Diastolic heart failure ([place]) 09/11/2017   DDD (degenerative disc disease), cervical 09/04/2017   Dyspnea, chronic DOE 02/27/2012   Herniated disc    Hyperlipidemia 0000000   Diastolic dysfunction, left ventricle 02/23/2009   Essential hypertension 10/18/2007   OSA on CPAP 10/11/2007     Current Outpatient Medications on File Prior to Visit  Medication Sig Dispense Refill   furosemide (LASIX) 20 MG tablet Take 1 tablet (20 mg total) by mouth daily as needed  for edema (for swelling in legs/ankle). (Patient not taking: Reported on 02/22/2021) 30 tablet 3   No current facility-administered medications on file prior to visit.    Allergies  Allergen Reactions   Codeine Nausea And Vomiting   Oxycodone Other (See Comments)    Upset GI    Social History   Socioeconomic History   Marital status: Divorced    Spouse name: Not on file   Number of children: 0   Years of education: some college   Highest education level: Not on file  Occupational History   Occupation: Retired  Tobacco Use   Smoking status: Never   Smokeless tobacco: Never  Vaping Use   Vaping Use:  Never used  Substance and Sexual Activity   Alcohol use: No   Drug use: No   Sexual activity: Not on file  Other Topics Concern   Not on file  Social History Narrative   Denies caffeine use    Social Determinants of Health   Financial Resource Strain: Not on file  Food Insecurity: Not on file  Transportation Needs: Not on file  Physical Activity: Not on file  Stress: Not on file  Social Connections: Not on file  Intimate Partner Violence: Not on file    Family History  Problem Relation Age of Onset   Hypertension Mother    Heart failure Mother    Alzheimer's disease Mother    Skin cancer Mother    Heart disease Father    Alzheimer's disease Maternal Grandmother    Alzheimer's disease Maternal Grandfather     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 at [place]  02/20/2009   normal coronary arteries   EYE SURGERY     bilateral cataract with lens implant   [place] N/A 01/02/2018   Procedure: IRRIGATION AND DEBRIDEMENT PERIRECTAL ABSCESS;  Surgeon: Coralie Keens, MD;  Location: [place];  Service: General;  Laterality: N/A;   IRRIGATION AND DEBRIDEMENT BUTTOCKS N/A 01/05/2018   Procedure: DEBRIDEMENT BUTTOCKS ABSCESS;  Surgeon: Georganna Skeans, MD;  Location: [place];  Service: General;  Laterality: N/A;   KNEE ARTHROSCOPY  2010   Lt   NM MYOCAR PERF WALL MOTION  09/28/2007   small area of reversibility in the anterolateral wall at the apex concerning for ischemia   SHOULDER ARTHROSCOPY  4/12   Rt   TOTAL KNEE ARTHROPLASTY Left 08/11/2019   Procedure: TOTAL KNEE ARTHROPLASTY;  Surgeon: Paralee Cancel, MD;  Location: [facility];  Service: Orthopedics;  Laterality: Left;  70 mins   TOTAL KNEE ARTHROPLASTY Right 09/13/2019   Procedure: TOTAL KNEE ARTHROPLASTY;  Surgeon: Paralee Cancel, MD;  Location: [facility];  Service: Orthopedics;  Laterality: Right;  70 mins   WOUND DEBRIDEMENT N/A  01/04/2018   Procedure: IRRIGATION AND DEBRIDEMENT OF BUTTOCKS AND REMOVAL OF TICK FROM RIGHT TESTICLE;  Surgeon: Georganna Skeans, MD;  Location: [place];  Service: General;  Laterality: N/A;    ROS: Review of Systems Negative except as stated above  PHYSICAL EXAM: BP 121/84   Pulse (!) 102   Resp 16   Wt 229 lb 9.6 oz (104.1 kg)   SpO2 100%   BMI 29.48 kg/m   Wt Readings from Last 3 Encounters:  02/22/21 229 lb 9.6 oz (104.1 kg)  11/15/20 230 lb 8 oz (104.6 kg)  02/07/20 225 lb (102.1 kg)    Physical Exam  General appearance - alert, well  appearing, older Caucasian male and in no distress Mental status -patient is slow in his replies but answers questions appropriately. Neck - supple, no significant adenopathy Chest - clear to auscultation, no wheezes, rales or rhonchi, symmetric air entry Heart -heart sounds are regular. Extremities -trace nonpitting edema.  CMP Latest Ref Rng & Units 11/16/2020 10/04/2020 10/27/2019  Glucose 65 - 99 mg/dL 113(H) 88 85  BUN 8 - 27 mg/dL '15 17 15  '$ Creatinine 0.76 - 1.27 mg/dL 1.07 1.00 0.76  Sodium 134 - 144 mmol/L 142 141 138  Potassium 3.5 - 5.2 mmol/L 3.9 4.5 3.6  Chloride 96 - 106 mmol/L 104 104 103  CO2 20 - 29 mmol/L '23 23 28  '$ Calcium 8.6 - 10.2 mg/dL 9.1 9.1 8.8(L)  Total Protein 6.0 - 8.5 g/dL - 6.6 6.8  Total Bilirubin 0.0 - 1.2 mg/dL - 0.5 0.6  Alkaline Phos 44 - 121 IU/L - 73 47  AST 0 - 40 IU/L - 15 12(L)  ALT 0 - 44 IU/L - 16 10   Lipid Panel     Component Value Date/Time   CHOL 99 (L) 12/27/2018 1144   TRIG 43 12/27/2018 1144   HDL 49 12/27/2018 1144   CHOLHDL 2.0 12/27/2018 1144   LDLCALC 41 12/27/2018 1144    CBC    Component Value Date/Time   WBC 6.2 10/04/2020 1635   WBC 7.2 10/27/2019 2037   RBC 4.91 10/04/2020 1635   RBC 3.83 (L) 10/27/2019 2037   HGB 14.8 10/04/2020 1635   HCT 43.1 10/04/2020 1635   PLT 248 10/04/2020 1635   MCV 88 10/04/2020 1635   MCH 30.1 10/04/2020 1635   MCH 30.3 10/27/2019 2037    MCHC 34.3 10/04/2020 1635   MCHC 32.6 10/27/2019 2037   RDW 14.1 10/04/2020 1635   LYMPHSABS 1.2 10/04/2020 1635   MONOABS 0.8 10/27/2019 2037   EOSABS 0.0 10/04/2020 1635   BASOSABS 0.0 10/04/2020 1635    ASSESSMENT AND PLAN: 1. Postoperative communicating hydrocephalus [facility]) Patient will keep his follow-up appointment with the neurosurgeon at [facility] next month for VP shunt.  He wanted to know what that would entail.  I told him this would best be discussed with the neurosurgeon.  Looks like he had an appointment with the neurologist on the 12th of this month and they did discuss this with him.  2. Chronic diastolic heart failure (HCC) Compensated.  He will continue to use the furosemide as needed. - Ambulatory referral to Cardiology  3. Essential hypertension Close to goal off meds.  Encouraged to limit salt in the foods and to continue to monitor blood pressure with goal being 130/80 or lower.  4. Arrhythmia, atrial He is agreeable for me to resubmit the referral to the cardiologist.  Stressed the importance of him following up on this. - Ambulatory referral to Cardiology  5. Over weight Encourage healthy eating habits.  6. Status post excision of acoustic neuroma Followed by neurology and neurosurgeon.  Plan for VP shunt by them next month  7. Gait disturbance Patient wants to see whether the dizziness and gait disturbance improves post placement of VP shunt.  If it does not he will consider doing some physical therapy.  8. 23-polyvalent pneumococcal polysaccharide vaccine declined Recommended.  Patient declined  9. Colon cancer screening declined Recommended.  Patient declined   Patient was given the opportunity to ask questions.  Patient verbalized understanding of the plan and was able to repeat key elements of the plan.  Orders Placed This Encounter  Procedures   Ambulatory referral to Cardiology     Requested Prescriptions    No prescriptions requested or  ordered in this encounter    Return in about 4 months (around 06/25/2021).  Karle Plumber, MD, FACP

## 2021-02-28 ENCOUNTER — Telehealth: Payer: Self-pay | Admitting: Internal Medicine

## 2021-02-28 NOTE — Telephone Encounter (Signed)
Called patient to get scheduled for a virtual visit with Dr. Wynetta Emery for incontinence supplies. Left vm for patient to call [phone] to schedule. Please transfer call office to get scheduled.

## 2021-03-11 NOTE — Progress Notes (Deleted)
Cardiology Office Note:    Date:  03/11/2021   ID:  Brandon Marks, DOB 08/30/1955, MRN [id number]  PCP:  Brandon Pier, MD   [facility] Providers Cardiologist:  Brandon Klein, MD { Click to update primary MD,subspecialty MD or APP then REFRESH:1}    Referring MD: Brandon Pier, MD   Follow-up for chronic diastolic CHF  History of Present Illness:    Brandon Marks is a 65 y.o. male with a hx of hypertension, chronic diastolic CHF, HLD, OSA, and morbid obesity.  He underwent cardiac catheterization in 2010 which showed normal coronary arteries after a false positive nuclear stress test.  Stress test was completed in anticipation of bariatric surgery.  He underwent repeat stress testing in 2018 which showed low risk and was negative for ischemia.  He was seen by Brandon Marks to/19 for preoperative cardiac evaluation.  He was cleared for gastric bypass at that time without further ischemic evaluation.  He underwent surgery 2/19.  He was readmitted 6/19 with necrotizing fasciitis of the right buttock.  He underwent I&D with debridement and was treated with antibiotics for septic shock.  A tick was found on him in the operating room.  He received prophylactic course of doxycycline.  He was noted to have kidney injury as a result of septic shock.  On arrival his creatinine was 2.1 which improved to 0.89 with fluid resuscitation/hydration.  He followed up with Brandon Deforest, PA-C on 03/14/2019.  During that time he reported he had lost about 140 pounds after his bariatric surgery.  He has not had any further problems with infection.  His blood pressure was well controlled.  He did not have any lower extremity swelling, denied orthopnea PND.  He denied recent episodes of chest discomfort.  He presents the clinic today for follow-up evaluation states***  *** denies chest pain, shortness of breath, lower extremity edema, fatigue, palpitations, melena, hematuria, hemoptysis, diaphoresis,  weakness, presyncope, syncope, orthopnea, and PND.   Past Medical History:  Diagnosis Date   Arthritis    Herniated disc    High cholesterol    Hypertension    Morbid obesity ([place])    PAC (premature atrial contraction)    Sleep apnea    non compliant with c-pap    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 at [place]  02/20/2009   normal coronary arteries   EYE SURGERY     bilateral cataract with lens implant   INCISION AND DRAINAGE PERIRECTAL ABSCESS N/A 01/02/2018   Procedure: IRRIGATION AND DEBRIDEMENT PERIRECTAL ABSCESS;  Surgeon: Brandon Keens, MD;  Location: [place];  Service: General;  Laterality: N/A;   IRRIGATION AND DEBRIDEMENT BUTTOCKS N/A 01/05/2018   Procedure: DEBRIDEMENT BUTTOCKS ABSCESS;  Surgeon: Brandon Skeans, MD;  Location: [place];  Service: General;  Laterality: N/A;   KNEE ARTHROSCOPY  2010   Lt   NM MYOCAR PERF WALL MOTION  09/28/2007   small area of reversibility in the anterolateral wall at the apex concerning for ischemia   SHOULDER ARTHROSCOPY  4/12   Rt   TOTAL KNEE ARTHROPLASTY Left 08/11/2019   Procedure: TOTAL KNEE ARTHROPLASTY;  Surgeon: Brandon Cancel, MD;  [facility];  Service: Orthopedics;  Laterality: Left;  70 mins   TOTAL KNEE ARTHROPLASTY Right 09/13/2019   Procedure: TOTAL KNEE ARTHROPLASTY;  Surgeon: Brandon Cancel, MD;  [facility];  Service: Orthopedics;  Laterality: Right;  70 mins  WOUND DEBRIDEMENT N/A 01/04/2018   Procedure: IRRIGATION AND DEBRIDEMENT OF BUTTOCKS AND REMOVAL OF TICK FROM RIGHT TESTICLE;  Surgeon: Brandon Skeans, MD;  Location: [place];  Service: General;  Laterality: N/A;    Current Medications: No outpatient medications have been marked as taking for the 03/12/21 encounter (Appointment) with Brandon Pelton, NP.     Allergies:   Codeine and Oxycodone   Social History   Socioeconomic History   Marital status: Divorced    Spouse name: Not  on file   Number of children: 0   Years of education: some college   Highest education level: Not on file  Occupational History   Occupation: Retired  Tobacco Use   Smoking status: Never   Smokeless tobacco: Never  Vaping Use   Vaping Use: Never used  Substance and Sexual Activity   Alcohol use: No   Drug use: No   Sexual activity: Not on file  Other Topics Concern   Not on file  Social History Narrative   Denies caffeine use    Social Determinants of Radio broadcast assistant Strain: Not on file  Food Insecurity: Not on file  Transportation Needs: Not on file  Physical Activity: Not on file  Stress: Not on file  Social Connections: Not on file     Family History: [name] ***family history includes Alzheimer's disease in his maternal grandfather, maternal grandmother, and mother; Heart disease in his father; Heart failure in his mother; Hypertension in his mother; Skin cancer in his mother.  ROS:   Please see the history of present illness.    *** All other systems reviewed and are negative.   Risk Assessment/Calculations:   {Does this patient have ATRIAL FIBRILLATION?:[phone]}       Physical Exam:    VS:  There were no vitals taken for this visit.    Wt Readings from Last 3 Encounters:  02/22/21 229 lb 9.6 oz (104.1 kg)  11/15/20 230 lb 8 oz (104.6 kg)  02/07/20 225 lb (102.1 kg)     GEN: *** Well nourished, well developed in no acute distress HEENT: Normal NECK: No JVD; No carotid bruits LYMPHATICS: No lymphadenopathy CARDIAC: ***RRR, no murmurs, rubs, gallops RESPIRATORY:  Clear to auscultation without rales, wheezing or rhonchi  ABDOMEN: Soft, non-tender, non-distended MUSCULOSKELETAL:  No edema; No deformity  SKIN: Warm and dry NEUROLOGIC:  Alert and oriented x 3 PSYCHIATRIC:  Normal affect    EKGs/Labs/Other Studies Reviewed:    The following studies were reviewed today: Nuclear stress test 12/31/2016 Nuclear stress EF: 62%. There  was no ST segment deviation noted during stress. This is a low risk study. The left ventricular ejection fraction is normal (55-65%).   1. EF 62%, normal wall motion.  2. Fixed small, mild mid anteroseptal and apical septal perfusion defect.  No evidence for ischemia.  Given normal wall motion, most likely attenuation.  Cannot rule out prior infarction.    Low risk study.   EKG:  EKG is *** ordered today.  The ekg ordered today demonstrates ***  Recent Labs: 10/04/2020: ALT 16; Hemoglobin 14.8; Platelets 248 11/16/2020: BUN 15; Creatinine, Ser 1.07; Potassium 3.9; Sodium 142; TSH 1.190  Recent Lipid Panel    Component Value Date/Time   CHOL 99 (L) 12/27/2018 1144   TRIG 43 12/27/2018 1144   HDL 49 12/27/2018 1144   CHOLHDL 2.0 12/27/2018 1144   LDLCALC 41 12/27/2018 1144    ASSESSMENT & PLAN    Chronic diastolic CHF-euvolemic  today.  Denies any recent increased activity intolerance or DOE.  Continues to maintain healthy weight.  Labs 11/16/2020 unremarkable Continue furosemide as needed Heart healthy low-sodium diet-salty 6 given Increase physical activity as tolerated  Essential hypertension-BP today***.  Well-controlled at home. Heart healthy low-sodium diet-salty 6 given Increase physical activity as tolerated  Hyperlipidemia-LDL 41 on 12/27/2018. Heart healthy low-sodium high-fiber diet Increase physical activity as tolerated Repeat fasting lipids  Morbid obesity-weight today***.  Has now lost***since gastric bypass surgery. Heart healthy low-sodium diet Increase physical activity  Follow-up with Dr. Sallyanne Kuster in 12 months.  {Are you ordering a CV Procedure (e.g. stress test, cath, DCCV, TEE, etc)?   Press F2        :[id number]    Medication Adjustments/Labs and Tests Ordered: Current medicines are reviewed at length with the patient today.  Concerns regarding medicines are outlined above.  No orders of the defined types were placed in this encounter.  No orders of  the defined types were placed in this encounter.   There are no Patient Instructions on file for this visit.   Signed, Brandon Pelton, NP  03/11/2021 7:53 AM      Notice: This dictation was prepared with Dragon dictation along with smaller phrase technology. Any transcriptional errors that result from this process are unintentional and may not be corrected upon review.  I spent***minutes examining this patient, reviewing medications, and using patient centered shared decision making involving her cardiac care.  Prior to her visit I spent greater than 20 minutes reviewing her past medical history,  medications, and prior cardiac tests.

## 2021-03-12 ENCOUNTER — Ambulatory Visit (HOSPITAL_BASED_OUTPATIENT_CLINIC_OR_DEPARTMENT_OTHER): Payer: Medicare Other | Admitting: General Practice

## 2021-03-20 ENCOUNTER — Telehealth: Payer: Self-pay | Admitting: Cardiovascular Disease

## 2021-03-20 NOTE — Telephone Encounter (Signed)
Spoke to patient and he is concerned about not being able to get his stunt placed yesterday. He wants to get this, "new Afib taken care of so he can get his stunt". Appointment already scheduled for 9/21 with Coletta Memos. Patient thinks his heart rate was ~100 at [facility] when they canceled his procedure. Not able to obtain reading at this time. Denies palpitations, SOB, CP, dizziness, pre syncope or syncope. ED precautions given.   Verbalized understanding.   Called Dr. Francesca Oman office to get records faxed.

## 2021-03-20 NOTE — Telephone Encounter (Signed)
Patient c/o Palpitations:  High priority if patient c/o lightheadedness, shortness of breath, or chest pain  How long have you had palpitations/irregular HR/ Afib? Are you having the symptoms now? He is not sure  Are you currently experiencing lightheadedness, SOB or CP? no  Do you have a history of afib (atrial fibrillation) or irregular heart rhythm? Not sure   Have you checked your BP or HR? (document readings if available): no  Are you experiencing any other symptoms? No  Patient states he was supposed to have a procedure at [facility] yesterday. It was cancelled because he was told he was in Afib. The patient has not been in afib before

## 2021-03-22 ENCOUNTER — Other Ambulatory Visit: Payer: Self-pay

## 2021-03-22 MED ORDER — APIXABAN 5 MG PO TABS: 5.0000 mg | ORAL_TABLET | Freq: Two times a day (BID) | ORAL | 6 refills | Status: DC

## 2021-03-24 ENCOUNTER — Telehealth: Payer: Self-pay

## 2021-03-24 NOTE — Telephone Encounter (Signed)
Called pt to schedule his AWV. Pt declined hes states he is busy on the weekends.

## 2021-04-01 ENCOUNTER — Encounter (HOSPITAL_COMMUNITY): Payer: Self-pay | Admitting: Emergency Medicine

## 2021-04-01 ENCOUNTER — Emergency Department (HOSPITAL_COMMUNITY)
Admission: EM | Admit: 2021-04-01 | Discharge: 2021-04-01 | Disposition: A | Discharge status: Home / Self Care | Payer: Medicare Other | Attending: Emergency Medicine | Admitting: Emergency Medicine

## 2021-04-01 ENCOUNTER — Other Ambulatory Visit: Payer: Self-pay

## 2021-04-01 DIAGNOSIS — Z85841 Personal history of malignant neoplasm of brain: Secondary | ICD-10-CM | POA: Diagnosis not present

## 2021-04-01 DIAGNOSIS — R42 Dizziness and giddiness: Secondary | ICD-10-CM | POA: Insufficient documentation

## 2021-04-01 DIAGNOSIS — I110 Hypertensive heart disease with heart failure: Secondary | ICD-10-CM | POA: Insufficient documentation

## 2021-04-01 DIAGNOSIS — Z96653 Presence of artificial knee joint, bilateral: Secondary | ICD-10-CM | POA: Insufficient documentation

## 2021-04-01 DIAGNOSIS — R000 Tachycardia, unspecified: Secondary | ICD-10-CM | POA: Diagnosis present

## 2021-04-01 DIAGNOSIS — Z7901 Long term (current) use of anticoagulants: Secondary | ICD-10-CM | POA: Diagnosis not present

## 2021-04-01 DIAGNOSIS — I4891 Unspecified atrial fibrillation: Secondary | ICD-10-CM | POA: Insufficient documentation

## 2021-04-01 DIAGNOSIS — I5030 Unspecified diastolic (congestive) heart failure: Secondary | ICD-10-CM | POA: Diagnosis not present

## 2021-04-01 LAB — CBC WITH DIFFERENTIAL/PLATELET
Abs Immature Granulocytes: 0.03 10*3/uL (ref 0.00–0.07)
Basophils Absolute: 0.1 10*3/uL (ref 0.0–0.1)
Basophils Relative: 1 %
Eosinophils Absolute: 0 10*3/uL (ref 0.0–0.5)
Eosinophils Relative: 0 %
HCT: 42.8 % (ref 39.0–52.0)
Hemoglobin: 14.2 g/dL (ref 13.0–17.0)
Immature Granulocytes: 0 %
Lymphocytes Relative: 11 %
Lymphs Abs: 1.2 10*3/uL (ref 0.7–4.0)
MCH: 30.9 pg (ref 26.0–34.0)
MCHC: 33.2 g/dL (ref 30.0–36.0)
MCV: 93.2 fL (ref 80.0–100.0)
Monocytes Absolute: 0.9 10*3/uL (ref 0.1–1.0)
Monocytes Relative: 9 %
Neutro Abs: 8.3 10*3/uL — ABNORMAL HIGH (ref 1.7–7.7)
Neutrophils Relative %: 79 %
Platelets: 230 10*3/uL (ref 150–400)
RBC: 4.59 MIL/uL (ref 4.22–5.81)
RDW: 13.2 % (ref 11.5–15.5)
WBC: 10.5 10*3/uL (ref 4.0–10.5)
nRBC: 0 % (ref 0.0–0.2)

## 2021-04-01 LAB — BASIC METABOLIC PANEL
Anion gap: 7 (ref 5–15)
BUN: 19 mg/dL (ref 8–23)
CO2: 26 mmol/L (ref 22–32)
Calcium: 8.8 mg/dL — ABNORMAL LOW (ref 8.9–10.3)
Chloride: 106 mmol/L (ref 98–111)
Creatinine, Ser: 1.07 mg/dL (ref 0.61–1.24)
GFR, Estimated: >60 mL/min (ref >60)
Glucose, Bld: 75 mg/dL (ref 70–99)
Potassium: 4.6 mmol/L (ref 3.5–5.1)
Sodium: 139 mmol/L (ref 135–145)

## 2021-04-01 LAB — TROPONIN I (HIGH SENSITIVITY): Troponin I (High Sensitivity): 3 ng/L (ref <18)

## 2021-04-01 MED ORDER — DILTIAZEM HCL ER COATED BEADS 180 MG PO CP24: 180.0000 mg | ORAL_CAPSULE | Freq: Every day | ORAL | 0 refills | Status: DC

## 2021-04-01 MED ORDER — DILTIAZEM HCL 25 MG/5ML IV SOLN
10.0000 mg | Freq: Once | INTRAVENOUS | Status: AC
  Administered 2021-04-01: 10 mg via INTRAVENOUS
  Filled 2021-04-01: qty 5

## 2021-04-01 MED ORDER — DILTIAZEM HCL ER COATED BEADS 240 MG PO CP24
240.0000 mg | ORAL_CAPSULE | Freq: Once | ORAL | Status: AC
  Administered 2021-04-01: 240 mg via ORAL
  Filled 2021-04-01: qty 1

## 2021-04-01 NOTE — ED Provider Notes (Signed)
[facility] EMERGENCY DEPARTMENT Provider Note   CSN: [id number] Arrival date & time: 04/01/21  1400     History Chief Complaint  Patient presents with   Tachycardia    Brandon Marks is a 65 y.o. male.  Patient presents to ER chief complaint of dizziness episode today while at home.  He states that he was lightheaded, no loss of consciousness or vision darkening no fall or trauma.  Denies headache or chest pain or abdominal pain.  Currently he states his dizziness is improved and he feels better without any symptoms.  Denies any recent illnesses no fever no cough no vomiting or diarrhea.  Patient states he was recently diagnosed with atrial fibrillation just this past week and started on Eliquis and Lasix as well.      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   23-polyvalent pneumococcal polysaccharide vaccine declined 02/22/2021   Benign neoplasm of brain, unspecified brain region [facility]) 11/15/2020   Status post excision of acoustic neuroma 02/07/2020   S/P right TKA 09/13/2019   S/P left TKA 08/11/2019   Genu varum of both lower extremities 06/13/2019   Vitamin B12 deficiency 06/13/2019   Obesity (BMI 30-39.9) 12/27/2018   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   Tinnitus of right ear 12/27/2018   Gait disturbance 12/27/2018   Normochromic anemia 12/27/2018   Primary osteoarthritis of both knees [id number]   Diastolic heart failure ([place]) 09/11/2017   DDD (degenerative disc disease), cervical 09/04/2017   Dyspnea, chronic DOE 02/27/2012   Herniated disc    Hyperlipidemia 0000000   Diastolic dysfunction, left ventricle 02/23/2009   Essential hypertension  10/18/2007   OSA on CPAP 10/11/2007    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 at [place]  02/20/2009   normal coronary arteries   EYE SURGERY     bilateral cataract with lens implant   INCISION AND DRAINAGE PERIRECTAL ABSCESS N/A 01/02/2018   Procedure: IRRIGATION AND DEBRIDEMENT PERIRECTAL ABSCESS;  Surgeon: Coralie Keens, MD;  Location: [place];  Service: General;  Laterality: N/A;   IRRIGATION AND DEBRIDEMENT BUTTOCKS N/A 01/05/2018   Procedure: DEBRIDEMENT BUTTOCKS ABSCESS;  Surgeon: Georganna Skeans, MD;  Location: [place];  Service: General;  Laterality: N/A;   KNEE ARTHROSCOPY  2010   Lt   NM MYOCAR PERF WALL MOTION  09/28/2007   small area of reversibility in the anterolateral wall at the apex concerning for ischemia   SHOULDER ARTHROSCOPY  4/12   Rt   TOTAL KNEE ARTHROPLASTY Left 08/11/2019   Procedure: TOTAL KNEE ARTHROPLASTY;  Surgeon: Paralee Cancel, MD;  Location: [facility];  Service: Orthopedics;  Laterality: Left;  70 mins   TOTAL KNEE ARTHROPLASTY Right 09/13/2019   Procedure: TOTAL KNEE ARTHROPLASTY;  Surgeon: Paralee Cancel, MD;  Location: [facility];  Service: Orthopedics;  Laterality: Right;  70 mins   WOUND DEBRIDEMENT N/A 01/04/2018   Procedure: IRRIGATION AND DEBRIDEMENT OF BUTTOCKS AND REMOVAL OF TICK FROM RIGHT TESTICLE;  Surgeon: Georganna Skeans, MD;  Location: [place];  Service: General;  Laterality: N/A;  Family History  Problem Relation Age of Onset   Hypertension Mother    Heart failure Mother    Alzheimer's disease Mother    Skin cancer Mother    Heart disease Father    Alzheimer's disease Maternal Grandmother    Alzheimer's disease Maternal Grandfather     Social History   Tobacco Use   Smoking status: Never   Smokeless tobacco: Never  Vaping Use   Vaping Use: Never used  Substance Use Topics   Alcohol use: No   Drug use: No    Home Medications Prior to Admission medications    Medication Sig Start Date End Date Taking? Authorizing Provider  apixaban (ELIQUIS) 5 MG TABS tablet Take 1 tablet (5 mg total) by mouth 2 (two) times daily. 03/22/21   Deberah Pelton, NP  furosemide (LASIX) 20 MG tablet Take 1 tablet (20 mg total) by mouth daily as needed for edema (for swelling in legs/ankle). Patient not taking: Reported on 02/22/2021 11/15/20   Ladell Pier, MD    Allergies    Codeine and Oxycodone  Review of Systems   Review of Systems  Constitutional:  Negative for fever.  HENT:  Negative for ear pain and sore throat.   Eyes:  Negative for pain.  Respiratory:  Negative for cough.   Cardiovascular:  Negative for chest pain.  Gastrointestinal:  Negative for abdominal pain.  Genitourinary:  Negative for flank pain.  Musculoskeletal:  Negative for back pain.  Skin:  Negative for color change and rash.  Neurological:  Negative for syncope.  All other systems reviewed and are negative.  Physical Exam Updated Vital Signs BP (!) 127/99   Pulse 89   Temp 98.4 F (36.9 C) (Oral)   Resp 20   Ht '6\' 2"'$  (1.88 m)   Wt 104.3 kg   SpO2 99%   BMI 29.53 kg/m   Physical Exam Constitutional:      Appearance: He is well-developed.  HENT:     Head: Normocephalic.     Nose: Nose normal.  Eyes:     Extraocular Movements: Extraocular movements intact.  Cardiovascular:     Rate and Rhythm: Normal rate.  Pulmonary:     Effort: Pulmonary effort is normal.  Skin:    Coloration: Skin is not jaundiced.  Neurological:     Mental Status: He is alert. Mental status is at baseline.    ED Results / Procedures / Treatments   Labs (all labs ordered are listed, but only abnormal results are displayed) Labs Reviewed  CBC WITH DIFFERENTIAL/PLATELET - Abnormal; Notable for the following components:      Result Value   Neutro Abs 8.3 (*)    All other components within normal limits  BASIC METABOLIC PANEL  TROPONIN I (HIGH SENSITIVITY)    EKG EKG  Interpretation  Date/Time:  Monday April 01 2021 14:06:55 EDT Ventricular Rate:  111 PR Interval:    QRS Duration: 102 QT Interval:  334 QTC Calculation: [id number] R Axis:   83 Text Interpretation: Atrial fibrillation Ventricular premature complex Borderline right axis deviation Low voltage, precordial leads Borderline T wave abnormalities Confirmed by Thamas Jaegers ([id number]) on 04/01/2021 2:13:44 PM  Radiology No results found.  Procedures .Critical Care  Date/Time: 04/01/2021 3:07 PM Performed by: Luna Fuse, MD Authorized by: Luna Fuse, MD   Critical care provider statement:    Critical care time (minutes):  30   Critical care time was exclusive of:  Separately billable procedures and treating  other patients   Critical care was necessary to treat or prevent imminent or life-threatening deterioration of the following conditions:  Cardiac failure Comments:     Afib RVR   Medications Ordered in ED Medications  diltiazem (CARDIZEM) injection 10 mg (10 mg Intravenous Given 04/01/21 1443)  diltiazem (CARDIZEM CD) 24 hr capsule 240 mg (240 mg Oral Given 04/01/21 1442)    ED Course  I have reviewed the triage vital signs and the nursing notes.  Pertinent labs & imaging results that were available during my care of the patient were reviewed by me and considered in my medical decision making (see chart for details).    MDM Rules/Calculators/A&P                           Patient presents with atrial fibrillation with mild RVR heart rates ranging from 100 -140 bpm, currently at the bedside at 110 bpm.  He states he no longer has any symptoms he is ambulatory has no chest pain no headache.  Patient started on diltiazem here IV and oral.  Once he is rate controlled, will recommend outpatient follow-up with his cardiologist within the week.  Recommending immediate return for worsening symptoms or any additional concerns.    Final Clinical Impression(s) / ED Diagnoses Final  diagnoses:  Atrial fibrillation with RVR [facility])    Rx / DC Orders ED Discharge Orders     None        Luna Fuse, MD 04/01/21 [phone]

## 2021-04-01 NOTE — ED Triage Notes (Signed)
Pt bib [facility] from home with c/o dizziness. Pt is in afib rvr with HR range 110-164. EMS gave 500cc fluid bolus and HR range improved to 82-144. Pt started on lasix 2 days ago for swelling in the lower extremities. Pt has not had good fluid intake recently.   EMS vitals 128/74 BP SpO2 100 CBG 134

## 2021-04-01 NOTE — Discharge Instructions (Addendum)
Call your primary care doctor or specialist as discussed in the next 2-3 days.   Return immediately back to the ER if:  Your symptoms worsen within the next 12-24 hours. You develop new symptoms such as new fevers, persistent vomiting, new pain, shortness of breath, or new weakness or numbness, or if you have any other concerns.  

## 2021-04-08 ENCOUNTER — Telehealth: Payer: Self-pay | Admitting: Internal Medicine

## 2021-04-08 NOTE — Telephone Encounter (Signed)
[facility] Called about plan of care orders from 6.24.22 that were faxed over/ please advise asap

## 2021-04-08 NOTE — Telephone Encounter (Signed)
Call returned  to [place] to [place] who took message to have [place] call this CM back tomorrow.

## 2021-04-09 NOTE — Telephone Encounter (Signed)
Call placed to [place] regarding the orders. Message left with call back requested to this CM.  Need to inform her that  Dr Wynetta Emery did not place the referral for services and the orders need to be sent to the provider who referred the patient. This CM spoke to [facility] about this issue on 02/07/2021 and sent [place] email to [place] notifying them that Dr Wynetta Emery did not order these services.

## 2021-04-10 NOTE — Progress Notes (Deleted)
Cardiology Office Note:    Date:  04/10/2021   ID:  [name], DOB 12/20/1955, MRN [id number]  PCP:  Ladell Pier, MD   [facility] Providers Cardiologist:  Sanda Klein, MD { Click to update primary MD,subspecialty MD or APP then REFRESH:1}    Referring MD: Ladell Pier, MD   Follow-up for chronic diastolic CHF  History of Present Illness:    Brandon Marks is a 65 y.o. male with a hx of hypertension, chronic diastolic CHF, HLD, OSA, and morbid obesity.  He underwent cardiac catheterization in 2010 which showed normal coronary arteries after a false positive nuclear stress test.  Stress test was completed in anticipation of bariatric surgery.  He underwent repeat stress testing in 2018 which showed low risk and was negative for ischemia.  He was seen by Richardson Dopp to/19 for preoperative cardiac evaluation.  He was cleared for gastric bypass at that time without further ischemic evaluation.  He underwent surgery 2/19.  He was readmitted 6/19 with necrotizing fasciitis of the right buttock.  He underwent I&D with debridement and was treated with antibiotics for septic shock.  A tick was found on him in the operating room.  He received prophylactic course of doxycycline.  He was noted to have kidney injury as a result of septic shock.  On arrival his creatinine was 2.1 which improved to 0.89 with fluid resuscitation/hydration.  He followed up with Almyra Deforest, PA-C on 03/14/2019.  During that time he reported he had lost about 140 pounds after his bariatric surgery.  He has not had any further problems with infection.  His blood pressure was well controlled.  He did not have any lower extremity swelling, denied orthopnea PND.  He denied recent episodes of chest discomfort.  He presents the clinic today for follow-up evaluation states***  *** denies chest pain, shortness of breath, lower extremity edema, fatigue, palpitations, melena, hematuria, hemoptysis, diaphoresis,  weakness, presyncope, syncope, orthopnea, and PND.  Past Medical History:  Diagnosis Date   Arthritis    Herniated disc    High cholesterol    Hypertension    Morbid obesity ([place])    PAC (premature atrial contraction)    Sleep apnea    non compliant with c-pap    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 at [place]  02/20/2009   normal coronary arteries   EYE SURGERY     bilateral cataract with lens implant   INCISION AND DRAINAGE PERIRECTAL ABSCESS N/A 01/02/2018   Procedure: IRRIGATION AND DEBRIDEMENT PERIRECTAL ABSCESS;  Surgeon: Coralie Keens, MD;  Location: [place];  Service: General;  Laterality: N/A;   IRRIGATION AND DEBRIDEMENT BUTTOCKS N/A 01/05/2018   Procedure: DEBRIDEMENT BUTTOCKS ABSCESS;  Surgeon: Georganna Skeans, MD;  Location: [place];  Service: General;  Laterality: N/A;   KNEE ARTHROSCOPY  2010   Lt   NM MYOCAR PERF WALL MOTION  09/28/2007   small area of reversibility in the anterolateral wall at the apex concerning for ischemia   SHOULDER ARTHROSCOPY  4/12   Rt   TOTAL KNEE ARTHROPLASTY Left 08/11/2019   Procedure: TOTAL KNEE ARTHROPLASTY;  Surgeon: Paralee Cancel, MD;  [facility];  Service: Orthopedics;  Laterality: Left;  70 mins   TOTAL KNEE ARTHROPLASTY Right 09/13/2019   Procedure: TOTAL KNEE ARTHROPLASTY;  Surgeon: Paralee Cancel, MD;  [facility];  Service: Orthopedics;  Laterality: Right;  70 mins  WOUND DEBRIDEMENT N/A 01/04/2018   Procedure: IRRIGATION AND DEBRIDEMENT OF BUTTOCKS AND REMOVAL OF TICK FROM RIGHT TESTICLE;  Surgeon: Georganna Skeans, MD;  Location: [place];  Service: General;  Laterality: N/A;    Current Medications: No outpatient medications have been marked as taking for the 04/17/21 encounter (Appointment) with Deberah Pelton, NP.     Allergies:   Codeine and Oxycodone   Social History   Socioeconomic History   Marital status: Divorced    Spouse name: Not on  file   Number of children: 0   Years of education: some college   Highest education level: Not on file  Occupational History   Occupation: Retired  Tobacco Use   Smoking status: Never   Smokeless tobacco: Never  Vaping Use   Vaping Use: Never used  Substance and Sexual Activity   Alcohol use: No   Drug use: No   Sexual activity: Not on file  Other Topics Concern   Not on file  Social History Narrative   Denies caffeine use    Social Determinants of Radio broadcast assistant Strain: Not on file  Food Insecurity: Not on file  Transportation Needs: Not on file  Physical Activity: Not on file  Stress: Not on file  Social Connections: Not on file     Family History: [name] ***family history includes Alzheimer's disease in his maternal grandfather, maternal grandmother, and mother; Heart disease in his father; Heart failure in his mother; Hypertension in his mother; Skin cancer in his mother.  ROS:   Please see the history of present illness.    *** All other systems reviewed and are negative.   Risk Assessment/Calculations:   {Does this patient have ATRIAL FIBRILLATION?:[phone]}       Physical Exam:    VS:  There were no vitals taken for this visit.    Wt Readings from Last 3 Encounters:  04/01/21 230 lb (104.3 kg)  02/22/21 229 lb 9.6 oz (104.1 kg)  11/15/20 230 lb 8 oz (104.6 kg)     GEN: *** Well nourished, well developed in no acute distress HEENT: Normal NECK: No JVD; No carotid bruits LYMPHATICS: No lymphadenopathy CARDIAC: ***RRR, no murmurs, rubs, gallops RESPIRATORY:  Clear to auscultation without rales, wheezing or rhonchi  ABDOMEN: Soft, non-tender, non-distended MUSCULOSKELETAL:  No edema; No deformity  SKIN: Warm and dry NEUROLOGIC:  Alert and oriented x 3 PSYCHIATRIC:  Normal affect    EKGs/Labs/Other Studies Reviewed:    The following studies were reviewed today:  Nuclear stress test 12/31/2016 Nuclear stress EF: 62%. There  was no ST segment deviation noted during stress. This is a low risk study. The left ventricular ejection fraction is normal (55-65%).   1. EF 62%, normal wall motion.  2. Fixed small, mild mid anteroseptal and apical septal perfusion defect.  No evidence for ischemia.  Given normal wall motion, most likely attenuation.  Cannot rule out prior infarction.    Low risk study.   EKG:  EKG is *** ordered today.  The ekg ordered today demonstrates ***  Recent Labs: 10/04/2020: ALT 16 11/16/2020: TSH 1.190 04/01/2021: BUN 19; Creatinine, Ser 1.07; Hemoglobin 14.2; Platelets 230; Potassium 4.6; Sodium 139  Recent Lipid Panel    Component Value Date/Time   CHOL 99 (L) 12/27/2018 1144   TRIG 43 12/27/2018 1144   HDL 49 12/27/2018 1144   CHOLHDL 2.0 12/27/2018 1144   LDLCALC 41 12/27/2018 1144    ASSESSMENT & PLAN    Chronic  diastolic CHF-euvolemic today.  Denies any recent increased activity intolerance or DOE.  Continues to maintain healthy weight.  Labs 11/16/2020 unremarkable Continue furosemide as needed Heart healthy low-sodium diet-salty 6 given Increase physical activity as tolerated  Essential hypertension-BP today***.  Well-controlled at home. Heart healthy low-sodium diet-salty 6 given Increase physical activity as tolerated  Hyperlipidemia-LDL 41 on 12/27/2018. Heart healthy low-sodium high-fiber diet Increase physical activity as tolerated Repeat fasting lipids  Morbid obesity-weight today***.  Has now lost***since gastric bypass surgery. Heart healthy low-sodium diet Increase physical activity  Follow-up with Dr. Sallyanne Kuster in 12 months.   {Are you ordering a CV Procedure (e.g. stress test, cath, DCCV, TEE, etc)?   Press F2        :[id number]    Medication Adjustments/Labs and Tests Ordered: Current medicines are reviewed at length with the patient today.  Concerns regarding medicines are outlined above.  No orders of the defined types were placed in this  encounter.  No orders of the defined types were placed in this encounter.   There are no Patient Instructions on file for this visit.   Signed, Deberah Pelton, NP  04/10/2021 7:26 AM      Notice: This dictation was prepared with Dragon dictation along with smaller phrase technology. Any transcriptional errors that result from this process are unintentional and may not be corrected upon review.  I spent***minutes examining this patient, reviewing medications, and using patient centered shared decision making involving her cardiac care.  Prior to her visit I spent greater than 20 minutes reviewing her past medical history,  medications, and prior cardiac tests.

## 2021-04-17 ENCOUNTER — Ambulatory Visit (HOSPITAL_BASED_OUTPATIENT_CLINIC_OR_DEPARTMENT_OTHER): Payer: Medicare Other | Admitting: General Practice

## 2021-04-22 ENCOUNTER — Telehealth: Payer: Self-pay | Admitting: Internal Medicine

## 2021-04-22 NOTE — Telephone Encounter (Signed)
Brandon Marks w/ [employer] calling about incontinence supplies. Brandon Marks states he may have just seen an add and called them. Because she states they have been faxing since 8/25 Brandon Marks to have pt call and make appt to discuss with huis dr.

## 2021-04-24 NOTE — Telephone Encounter (Signed)
Please contact and schedule virtual appt

## 2021-04-24 NOTE — Telephone Encounter (Signed)
Please schedule patient with PCP for incontinence supply televisit. This is not urgent

## 2021-04-24 NOTE — Telephone Encounter (Signed)
Called patient no answer left vm to call [phone] to schedule a tele visit.

## 2021-05-03 ENCOUNTER — Telehealth: Payer: Self-pay | Admitting: Cardiovascular Disease

## 2021-05-03 NOTE — Telephone Encounter (Signed)
Abigail Butts, patient's assistanct calling to inform the patient gave the wrong phone number and to call: [phone]

## 2021-05-03 NOTE — Telephone Encounter (Signed)
Pt c/o BP issue: STAT if pt c/o blurred vision, one-sided weakness or slurred speech  1. What are your last 5 BP readings? 82/62 couple days ago, not sure what it is today  2. Are you having any other symptoms (ex. Dizziness, headache, blurred vision, passed out)? Lightheaded, headache  3. What is your BP issue? Patient says his BP has been low. He states he does feel lightheaded, but does not feel like he will pass out now. He states when he stands up quick he feels like he may fall.

## 2021-05-03 NOTE — Telephone Encounter (Signed)
Left message to call back  

## 2021-05-03 NOTE — Telephone Encounter (Signed)
Spoke to patient he stated his B/P has been low.Last Friday 9/30 82/62.Stated he has not checked B/P today.He is presently out at [employer] and feels ok.Appointment scheduled with Laurann Montana NP 10/10 at 9:00 am at [facility] location.Advised to bring a list of all medications and B/P readings.

## 2021-05-06 ENCOUNTER — Ambulatory Visit (INDEPENDENT_AMBULATORY_CARE_PROVIDER_SITE_OTHER): Payer: Medicare Other | Admitting: Family

## 2021-05-06 ENCOUNTER — Emergency Department (HOSPITAL_BASED_OUTPATIENT_CLINIC_OR_DEPARTMENT_OTHER)
Admission: EM | Admit: 2021-05-06 | Discharge: 2021-05-06 | Disposition: A | Discharge status: Home / Self Care | Payer: Medicare Other | Attending: Emergency Medicine | Admitting: Emergency Medicine

## 2021-05-06 ENCOUNTER — Other Ambulatory Visit: Payer: Self-pay

## 2021-05-06 ENCOUNTER — Telehealth (HOSPITAL_BASED_OUTPATIENT_CLINIC_OR_DEPARTMENT_OTHER): Payer: Self-pay | Admitting: Family

## 2021-05-06 ENCOUNTER — Encounter (HOSPITAL_BASED_OUTPATIENT_CLINIC_OR_DEPARTMENT_OTHER): Payer: Self-pay | Admitting: Emergency Medicine

## 2021-05-06 DIAGNOSIS — I480 Paroxysmal atrial fibrillation: Secondary | ICD-10-CM

## 2021-05-06 DIAGNOSIS — I110 Hypertensive heart disease with heart failure: Secondary | ICD-10-CM | POA: Diagnosis not present

## 2021-05-06 DIAGNOSIS — I5030 Unspecified diastolic (congestive) heart failure: Secondary | ICD-10-CM | POA: Diagnosis not present

## 2021-05-06 DIAGNOSIS — I4891 Unspecified atrial fibrillation: Secondary | ICD-10-CM

## 2021-05-06 DIAGNOSIS — R42 Dizziness and giddiness: Secondary | ICD-10-CM | POA: Insufficient documentation

## 2021-05-06 DIAGNOSIS — Z7901 Long term (current) use of anticoagulants: Secondary | ICD-10-CM | POA: Insufficient documentation

## 2021-05-06 DIAGNOSIS — Z96653 Presence of artificial knee joint, bilateral: Secondary | ICD-10-CM | POA: Insufficient documentation

## 2021-05-06 DIAGNOSIS — E162 Hypoglycemia, unspecified: Secondary | ICD-10-CM

## 2021-05-06 LAB — CBC
HCT: 41.9 % (ref 39.0–52.0)
Hemoglobin: 14.1 g/dL (ref 13.0–17.0)
MCH: 30.5 pg (ref 26.0–34.0)
MCHC: 33.7 g/dL (ref 30.0–36.0)
MCV: 90.5 fL (ref 80.0–100.0)
Platelets: 216 10*3/uL (ref 150–400)
RBC: 4.63 MIL/uL (ref 4.22–5.81)
RDW: 13.4 % (ref 11.5–15.5)
WBC: 6.1 10*3/uL (ref 4.0–10.5)
nRBC: 0 % (ref 0.0–0.2)

## 2021-05-06 LAB — COMPREHENSIVE METABOLIC PANEL
ALT: 15 U/L (ref 0–44)
AST: 15 U/L (ref 15–41)
Albumin: 3.9 g/dL (ref 3.5–5.0)
Alkaline Phosphatase: 58 U/L (ref 38–126)
Anion gap: 7 (ref 5–15)
BUN: 18 mg/dL (ref 8–23)
CO2: 27 mmol/L (ref 22–32)
Calcium: 8.9 mg/dL (ref 8.9–10.3)
Chloride: 106 mmol/L (ref 98–111)
Creatinine, Ser: 0.96 mg/dL (ref 0.61–1.24)
GFR, Estimated: >60 mL/min (ref >60)
Glucose, Bld: 55 mg/dL — ABNORMAL LOW (ref 70–99)
Potassium: 3.6 mmol/L (ref 3.5–5.1)
Sodium: 140 mmol/L (ref 135–145)
Total Bilirubin: 0.7 mg/dL (ref 0.3–1.2)
Total Protein: 6.6 g/dL (ref 6.5–8.1)

## 2021-05-06 NOTE — Progress Notes (Signed)
Pt cancelled visit.   No charge.   Loel Dubonnet, NP

## 2021-05-06 NOTE — ED Triage Notes (Signed)
Pt arrives to ED with c/o of dizziness, lightheadedness and falls over the past week. Pt reports that he has fallen once this past week after having syncopal episode after getting up too fast. He reports his last recorded BP was in the 80's when he was seen at [facility] ER for same. Pt with hx of hydrocephalus and benign neoplasm of brain.

## 2021-05-06 NOTE — Telephone Encounter (Signed)
Left message asking patient to call and reschedule 05/06/21 9:00 am appt with Laurann Montana, NP

## 2021-05-06 NOTE — ED Provider Notes (Signed)
[facility] EMERGENCY DEPT Provider Note   CSN: [id number] Arrival date & time: 05/06/21  4536     History Chief Complaint  Patient presents with   Recent episode of dizziness    Brandon Marks is a 65 y.o. male.  Pt with hx afib, remote prior resection acoustic neuroma, hydrocephalus noted in past several months (with recent postponement shunt procedure due to afib) presents for 'recheck'. Was seen at [facility] ED recently after episode of faintness/transient low bp. Since then, denies recurrence. No current faintness or dizziness. States hx afib, recent rx eliquis, but not compliant w therapy (although indicates has rx at home). Pt relatively poor historian - level 5 caveat. Pt denies pain. No headache. No change in speech or vision. No numbness/weakness. No change in gait or balance. Denies chest pain or sob. No cough or uri symptoms. No abd pain or nv. No gu c/o. States his only issue now is hungry, and wants to go get some food.   The history is provided by the patient, a relative and medical records.  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   23-polyvalent pneumococcal polysaccharide vaccine declined 02/22/2021   Benign neoplasm of brain, unspecified brain region [facility]) 11/15/2020   Status post excision of acoustic neuroma 02/07/2020   S/P right TKA 09/13/2019   S/P left TKA 08/11/2019   Genu varum of both lower extremities 06/13/2019   Vitamin B12 deficiency 06/13/2019   Obesity (BMI 30-39.9) 12/27/2018   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    Tinnitus of right ear 12/27/2018   Gait disturbance 12/27/2018   Normochromic anemia 12/27/2018   Primary osteoarthritis of both knees 46/80/3212   Diastolic heart failure ([place]) 09/11/2017   DDD (degenerative disc disease), cervical 09/04/2017   Dyspnea, chronic DOE 02/27/2012   Herniated disc    Hyperlipidemia 24/82/5003   Diastolic dysfunction, left ventricle 02/23/2009   Essential hypertension 10/18/2007   OSA on CPAP 10/11/2007    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 at [place]  02/20/2009   normal coronary arteries   EYE SURGERY     bilateral cataract with lens implant   INCISION AND DRAINAGE PERIRECTAL ABSCESS N/A 01/02/2018   Procedure: IRRIGATION AND DEBRIDEMENT PERIRECTAL ABSCESS;  Surgeon: Coralie Keens, MD;  Location: [place];  Service: General;  Laterality: N/A;   IRRIGATION AND DEBRIDEMENT BUTTOCKS N/A 01/05/2018   Procedure: DEBRIDEMENT BUTTOCKS ABSCESS;  Surgeon: Georganna Skeans, MD;  Location: [facility] [place];  Service: General;  Laterality: N/A;   KNEE ARTHROSCOPY  2010   Lt   NM MYOCAR PERF WALL MOTION  09/28/2007   small area of reversibility in the anterolateral wall at the apex concerning for ischemia   SHOULDER ARTHROSCOPY  4/12   Rt   TOTAL KNEE ARTHROPLASTY Left 08/11/2019   Procedure: TOTAL KNEE ARTHROPLASTY;  Surgeon: Paralee Cancel, MD;  Location: [facility];  Service: Orthopedics;  Laterality: Left;  70 mins   TOTAL KNEE  ARTHROPLASTY Right 09/13/2019   Procedure: TOTAL KNEE ARTHROPLASTY;  Surgeon: Paralee Cancel, MD;  Location: [facility];  Service: Orthopedics;  Laterality: Right;  70 mins   WOUND DEBRIDEMENT N/A 01/04/2018   Procedure: IRRIGATION AND DEBRIDEMENT OF BUTTOCKS AND REMOVAL OF TICK FROM RIGHT TESTICLE;  Surgeon: Georganna Skeans, MD;  Location: [facility];  Service: General;  Laterality: N/A;       Family History  Problem Relation Age of Onset   Hypertension Mother    Heart failure Mother     Alzheimer's disease Mother    Skin cancer Mother    Heart disease Father    Alzheimer's disease Maternal Grandmother    Alzheimer's disease Maternal Grandfather     Social History   Tobacco Use   Smoking status: Never   Smokeless tobacco: Never  Vaping Use   Vaping Use: Never used  Substance Use Topics   Alcohol use: No   Drug use: No    Home Medications Prior to Admission medications   Medication Sig Start Date End Date Taking? Authorizing Provider  apixaban (ELIQUIS) 5 MG TABS tablet Take 1 tablet (5 mg total) by mouth 2 (two) times daily. 03/22/21   Deberah Pelton, NP  diltiazem (CARDIZEM CD) 180 MG 24 hr capsule Take 1 capsule (180 mg total) by mouth daily. 04/01/21 05/01/21  Gareth Morgan, MD  furosemide (LASIX) 20 MG tablet Take 1 tablet (20 mg total) by mouth daily as needed for edema (for swelling in legs/ankle). Patient not taking: Reported on 02/22/2021 11/15/20   Ladell Pier, MD    Allergies    Codeine and Oxycodone  Review of Systems   Review of Systems  Constitutional:  Negative for chills and fever.  HENT:  Negative for sore throat.   Eyes:  Negative for visual disturbance.  Respiratory:  Negative for cough and shortness of breath.   Cardiovascular:  Negative for chest pain.  Gastrointestinal:  Negative for abdominal pain, diarrhea and vomiting.  Genitourinary:  Negative for flank pain.  Musculoskeletal:  Negative for back pain and neck pain.  Skin:  Negative for rash.  Neurological:  Negative for speech difficulty, weakness and numbness.       Recent episode of faintness/lightheadedness.   Hematological:  Does not bruise/bleed easily.  Psychiatric/Behavioral:  Negative for agitation.    Physical Exam Updated Vital Signs BP 122/84 (BP Location: Left Arm)   Pulse 79   Temp 98.1 F (36.7 C) (Oral)   Resp 16   Ht 1.88 m (6\' 2" )   Wt 104.3 kg   SpO2 100%   BMI 29.53 kg/m   Physical Exam  ED Results / Procedures / Treatments   Labs (all  labs ordered are listed, but only abnormal results are displayed) Results for orders placed or performed during the hospital encounter of 05/06/21  CBC  Result Value Ref Range   WBC 6.1 4.0 - 10.5 K/uL   RBC 4.63 4.22 - 5.81 MIL/uL   Hemoglobin 14.1 13.0 - 17.0 g/dL   HCT 41.9 39.0 - 52.0 %   MCV 90.5 80.0 - 100.0 fL   MCH 30.5 26.0 - 34.0 pg   MCHC 33.7 30.0 - 36.0 g/dL   RDW 13.4 11.5 - 15.5 %   Platelets 216 150 - 400 K/uL   nRBC 0.0 0.0 - 0.2 %  Comprehensive metabolic panel  Result Value Ref Range   Sodium 140 135 - 145 mmol/L   Potassium 3.6 3.5 - 5.1 mmol/L   Chloride  106 98 - 111 mmol/L   CO2 27 22 - 32 mmol/L   Glucose, Bld 55 (L) 70 - 99 mg/dL   BUN 18 8 - 23 mg/dL   Creatinine, Ser 0.96 0.61 - 1.24 mg/dL   Calcium 8.9 8.9 - 10.3 mg/dL   Total Protein 6.6 6.5 - 8.1 g/dL   Albumin 3.9 3.5 - 5.0 g/dL   AST 15 15 - 41 U/L   ALT 15 0 - 44 U/L   Alkaline Phosphatase 58 38 - 126 U/L   Total Bilirubin 0.7 0.3 - 1.2 mg/dL   GFR, Estimated >60 >60 mL/min   Anion gap 7 5 - 15    EKG EKG Interpretation  Date/Time:  Monday May 06 2021 09:51:03 EDT Ventricular Rate:  100 PR Interval:    QRS Duration: 111 QT Interval:  348 QTC Calculation: 449 R Axis:   94 Text Interpretation: Atrial fibrillation Low voltage, precordial leads Confirmed by Lajean Saver [phone]) on 05/06/2021 9:57:27 AM  Radiology No results found.  Procedures Procedures   Medications Ordered in ED Medications - No data to display  ED Course  I have reviewed the triage vital signs and the nursing notes.  Pertinent labs & imaging results that were available during my care of the patient were reviewed by me and considered in my medical decision making (see chart for details).    MDM Rules/Calculators/A&P                          Labs sent.   Reviewed nursing notes and prior charts for additional history.  Recent chart/eval in ED and at [facility] after episode lightheadedness reviewed.   Labs  reviewed/interpreted by me - wbc and hgb normal. Glucose mildly low - pt indicates has not eaten/drank yet today. Po fluids/food.   Pt currently denies any acute/new symptoms, other than wanting to go and get something to eat/hungry.   Vitals are normal. Labs reassuring.   Pt currently appears stable for d/c.   Rec pcp, cardiology, and neuro f/u.  On review of charts, it appears pt had appt for f/u afib and [facility] cardiology this AM - call office, they indicate pt will need to call to reschedule.  Pt currently appears stable for d/c.        Final Clinical Impression(s) / ED Diagnoses Final diagnoses:  None    Rx / DC Orders ED Discharge Orders     None        Lajean Saver, MD 05/06/21 1119

## 2021-05-06 NOTE — Discharge Instructions (Addendum)
It was our pleasure to provide your ER care today - we hope that you feel better.  Drink adequate fluids/stay well hydrated.   For your afib, take your blood thinner as prescribed. Follow up with cardiologist in one week - call office to re-schedule appointment.  Also re-schedule with your neurologist/surgeon.   Return to ER if worse, new symptoms, fevers, new/severe pain, trouble breathing, fainting, or other concern.

## 2021-05-06 NOTE — ED Notes (Signed)
Caregiver at bedside, she is with him Mon-Sat. Pt LKW Saturday 6pm. Pt presents with a slight Left side facial droop. Pt and caregiver both deny hx of same. Pt currently being seen at [facility] for a newly dx brain tumor. Pt was in A-Fib, therefore unable to under go shunt placement at that time. They advised him to f/u with cardiology first

## 2021-05-16 ENCOUNTER — Telehealth: Payer: Self-pay

## 2021-05-16 NOTE — Telephone Encounter (Signed)
   [facility] Pre-operative Risk Assessment    Patient Name: Brandon Marks  DOB: 05-22-1956 MRN: [id number]  HEARTCARE STAFF:  - IMPORTANT!!!!!! Under Visit Info/Reason for Call, type in Other and utilize the format Clearance MM/DD/YY or Clearance TBD. Do not use dashes or single digits. - Please review there is not already an duplicate clearance open for this procedure. - If request is for dental extraction, please clarify the # of teeth to be extracted. - If the patient is currently at the dentist's office, call Pre-Op Callback Staff (MA/nurse) to input urgent request.  - If the patient is not currently in the dentist office, please route to the Pre-Op pool.  Request for surgical clearance:  What type of surgery is being performed? PLACEMENT OF A VENTRICULOPERITONEAL SHUNT FOR NORMAL PRESSURE HYDROCEPHALUS  When is this surgery scheduled? TBD  What type of clearance is required (medical clearance vs. Pharmacy clearance to hold med vs. Both)? BOTH  Are there any medications that need to be held prior to surgery and how long? ELIQUIS X 3 DAYS  Practice name and name of physician performing surgery? [facility]  What is the office phone number? [phone]   7.   What is the office fax number? [phone]  8.   Anesthesia type (None, local, MAC, general) ? GENERAL    Jacinta Shoe 05/16/2021, 4:58 PM  _________________________________________________________________   (provider comments below)

## 2021-05-17 NOTE — Telephone Encounter (Signed)
I left a message for the patient to return my call to schedule an appointment for pre-op clearance.

## 2021-05-17 NOTE — Telephone Encounter (Signed)
Patient with diagnosis of A Fib on Eliquis for anticoagulation.    Procedure: PLACEMENT OF A VENTRICULOPERITONEAL SHUNT FOR NORMAL PRESSURE HYDROCEPHALUS Date of procedure: TBD   CHA2DS2-VASc Score = 3  This indicates a 3.2% annual risk of stroke. [name] score is based upon: CHF History: 1 HTN History: 1 Diabetes History: 0 Stroke History: 0 Vascular Disease History: 0 Age Score: 1 Gender Score: 0    CrCl 110 mL/min Platelet count 216K   Per office protocol, patient can hold Eliquis for 3 days prior to procedure.

## 2021-05-17 NOTE — Telephone Encounter (Signed)
Primary Cardiologist:Mihai Croitoru, MD  Chart reviewed as part of pre-operative protocol coverage. Because of Brandon Marks's past medical history and time since last visit, he/she will require a follow-up visit in order to better assess preoperative cardiovascular risk.  Pre-op covering staff: - Please schedule appointment and call patient to inform them. - Please contact requesting surgeon's office via preferred method (i.e, phone, fax) to inform them of need for appointment prior to surgery.  If applicable, this message will also be routed to pharmacy pool and/or primary cardiologist for input on holding anticoagulant/antiplatelet agent as requested below so that this information is available at time of patient's appointment.   Deberah Pelton, NP  05/17/2021, 10:16 AM

## 2021-05-20 NOTE — Telephone Encounter (Signed)
Left another msg for patient to call the office.

## 2021-05-21 NOTE — Telephone Encounter (Signed)
Pt has been scheduled to see Sheela Stack, NP, 05/31/2021, clearance will be addressed at that visit.  Will route back to the requesting surgeon's office to make them aware.

## 2021-05-29 NOTE — Progress Notes (Signed)
Cardiology Clinic Note   Patient Name: Brandon Marks Date of Encounter: 05/31/2021  Primary Care Provider:  Ladell Pier, MD Primary Cardiologist:  Sanda Klein, MD  Patient Profile    Brandon Marks 65 year old male presents for follow-up for chronic diastolic CHF and preoperative cardiac evaluation  Past Medical History    Past Medical History:  Diagnosis Date   Arthritis    Herniated disc    High cholesterol    Hypertension    Morbid obesity ([place])    PAC (premature atrial contraction)    Sleep apnea    non compliant with c-pap   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 at [place]  02/20/2009   normal coronary arteries   EYE SURGERY     bilateral cataract with lens implant   [place] N/A 01/02/2018   Procedure: IRRIGATION AND DEBRIDEMENT PERIRECTAL ABSCESS;  Surgeon: Coralie Keens, MD;  Location: [place];  Service: General;  Laterality: N/A;   IRRIGATION AND DEBRIDEMENT BUTTOCKS N/A 01/05/2018   Procedure: DEBRIDEMENT BUTTOCKS ABSCESS;  Surgeon: Georganna Skeans, MD;  Location: [place];  Service: General;  Laterality: N/A;   KNEE ARTHROSCOPY  2010   Lt   NM MYOCAR PERF WALL MOTION  09/28/2007   small area of reversibility in the anterolateral wall at the apex concerning for ischemia   SHOULDER ARTHROSCOPY  4/12   Rt   TOTAL KNEE ARTHROPLASTY Left 08/11/2019   Procedure: TOTAL KNEE ARTHROPLASTY;  Surgeon: Paralee Cancel, MD;  [facility];  Service: Orthopedics;  Laterality: Left;  70 mins   TOTAL KNEE ARTHROPLASTY Right 09/13/2019   Procedure: TOTAL KNEE ARTHROPLASTY;  Surgeon: Paralee Cancel, MD;  [facility];  Service: Orthopedics;  Laterality: Right;  70 mins   WOUND DEBRIDEMENT N/A 01/04/2018   Procedure: IRRIGATION AND DEBRIDEMENT OF BUTTOCKS AND REMOVAL OF TICK FROM RIGHT TESTICLE;  Surgeon: Georganna Skeans, MD;  Location: [place];  Service: General;   Laterality: N/A;    Allergies  Allergies  Allergen Reactions   Codeine Nausea And Vomiting   Oxycodone Other (See Comments)    Upset GI    History of Present Illness    Brandon Marks is a 65 y.o. male with a hx of hypertension, chronic diastolic CHF, HLD, OSA, and morbid obesity.  He underwent cardiac catheterization in 2010 which showed normal coronary arteries after a false positive nuclear stress test.  Stress test was completed in anticipation of bariatric surgery.  He underwent repeat stress testing in 2018 which showed low risk and was negative for ischemia.  He was seen by Richardson Dopp to/19 for preoperative cardiac evaluation.  He was cleared for gastric bypass at that time without further ischemic evaluation.  He underwent surgery 2/19.  He was readmitted 6/19 with necrotizing fasciitis of the right buttock.  He underwent I&D with debridement and was treated with antibiotics for septic shock.  A tick was found on him in the operating room.  He received prophylactic course of doxycycline.  He was noted to have kidney injury as a result of septic shock.  On arrival his creatinine was 2.1 which improved to 0.89 with fluid resuscitation/hydration.  He followed up with Almyra Deforest, PA-C on 03/14/2019.  During that time he reported he had lost about 140 pounds after his bariatric surgery.  He has not had any further problems with infection.  His blood pressure was  well controlled.  He did not have any lower extremity swelling, denied orthopnea PND.  He denied recent episodes of chest discomfort.  He presents the clinic today for follow-up evaluation and preoperative cardiac evaluation.  He states he feels well aside from some slight dizziness.  He presents today in anticipation of shunt placement.  He is having his procedure done at [facility].  He has been somewhat physically active and is able to climb 2 stairs without chest pain or increased work of breathing.  He reports compliance with his apixaban  and denies bleeding issues.  We reviewed recommendations from pharmacy prior to his upcoming surgery.  His blood pressure has been well controlled.  He is cardiac unaware.  I will give him the salty 6 diet sheet, have him maintain his physical activity, have him continue his heart healthy low-sodium diet, and follow-up in 6 to 9 months.  Today he denies chest pain, shortness of breath, lower extremity edema, fatigue, palpitations, melena, hematuria, hemoptysis, diaphoresis, weakness, presyncope, syncope, orthopnea, and PND.  Home Medications    Prior to Admission medications   Medication Sig Start Date End Date Taking? Authorizing Provider  apixaban (ELIQUIS) 5 MG TABS tablet Take 1 tablet (5 mg total) by mouth 2 (two) times daily. 03/22/21   Deberah Pelton, NP  diltiazem (CARDIZEM CD) 180 MG 24 hr capsule Take 1 capsule (180 mg total) by mouth daily. 04/01/21 05/01/21  Gareth Morgan, MD  furosemide (LASIX) 20 MG tablet Take 1 tablet (20 mg total) by mouth daily as needed for edema (for swelling in legs/ankle). Patient not taking: Reported on 02/22/2021 11/15/20   Ladell Pier, MD    Family History    Family History  Problem Relation Age of Onset   Hypertension Mother    Heart failure Mother    Alzheimer's disease Mother    Skin cancer Mother    Heart disease Father    Alzheimer's disease Maternal Grandmother    Alzheimer's disease Maternal Grandfather    He indicated that his mother is alive. He indicated that his father is deceased. He indicated that his brother is alive. He indicated that his maternal grandmother is deceased. He indicated that his maternal grandfather is deceased. He indicated that his paternal grandmother is deceased. He indicated that his paternal grandfather is deceased.  Social History    Social History   Socioeconomic History   Marital status: Divorced    Spouse name: Not on file   Number of children: 0   Years of education: some college   Highest  education level: Not on file  Occupational History   Occupation: Retired  Tobacco Use   Smoking status: Never   Smokeless tobacco: Never  Vaping Use   Vaping Use: Never used  Substance and Sexual Activity   Alcohol use: No   Drug use: No   Sexual activity: Not on file  Other Topics Concern   Not on file  Social History Narrative   Denies caffeine use    Social Determinants of Health   Financial Resource Strain: Not on file  Food Insecurity: Not on file  Transportation Needs: Not on file  Physical Activity: Not on file  Stress: Not on file  Social Connections: Not on file  Intimate Partner Violence: Not on file     Review of Systems    General:  No chills, fever, night sweats or weight changes.  Cardiovascular:  No chest pain, dyspnea on exertion, edema, orthopnea, palpitations, paroxysmal nocturnal dyspnea.  Dermatological: No rash, lesions/masses Respiratory: No cough, dyspnea Urologic: No hematuria, dysuria Abdominal:   No nausea, vomiting, diarrhea, bright red blood per rectum, melena, or hematemesis Neurologic:  No visual changes, wkns, changes in mental status. All other systems reviewed and are otherwise negative except as noted above.  Physical Exam    VS:  BP 122/68   Pulse 100   Ht 6\' 2"  (1.88 m)   Wt 240 lb (108.9 kg)   SpO2 100%   BMI 30.81 kg/m  , BMI Body mass index is 30.81 kg/m. GEN: Well nourished, well developed, in no acute distress. HEENT: normal. Neck: Supple, no JVD, carotid bruits, or masses. Cardiac: RRR, no murmurs, rubs, or gallops. No clubbing, cyanosis, bilateral lower extremity nonpitting edema.  Radials/DP/PT 2+ and equal bilaterally.  Respiratory:  Respirations regular and unlabored, clear to auscultation bilaterally. GI: Soft, nontender, nondistended, BS + x 4. MS: no deformity or atrophy. Skin: warm and dry, no rash. Neuro:  Strength and sensation are intact. Psych: Normal affect.  Accessory Clinical Findings    Recent  Labs: 11/16/2020: TSH 1.190 05/06/2021: ALT 15; BUN 18; Creatinine, Ser 0.96; Hemoglobin 14.1; Platelets 216; Potassium 3.6; Sodium 140   Recent Lipid Panel    Component Value Date/Time   CHOL 99 (L) 12/27/2018 1144   TRIG 43 12/27/2018 1144   HDL 49 12/27/2018 1144   CHOLHDL 2.0 12/27/2018 1144   LDLCALC 41 12/27/2018 1144    ECG personally reviewed by me today-none today.  Nuclear stress test 12/31/2016 Nuclear stress EF: 62%. There was no ST segment deviation noted during stress. This is a low risk study. The left ventricular ejection fraction is normal (55-65%).   1. EF 62%, normal wall motion.  2. Fixed small, mild mid anteroseptal and apical septal perfusion defect.  No evidence for ischemia.  Given normal wall motion, most likely attenuation.  Cannot rule out prior infarction.    Low risk study.    Assessment & Plan   1.  Chronic diastolic CHF-bilateral lower extremity nonpitting.  Denies any recent increased activity intolerance or DOE.  Continues to maintain healthy weight.  Labs 11/16/2020 unremarkable Continue furosemide as needed-for lower extremity edema or weight increase of 3 pounds overnight or 5 pounds in 1 week Heart healthy low-sodium diet-salty 6 given Increase physical activity as tolerated  Essential hypertension-BP today 122/68.  Well-controlled at home. Heart healthy low-sodium diet-salty 6 given Increase physical activity as tolerated  Hyperlipidemia-LDL 41 on 12/27/2018. Heart healthy low-sodium high-fiber diet Increase physical activity as tolerated Follows with PCP  Morbid obesity-weight today 240.  Has now lost 170 lb since gastric bypass surgery. Heart healthy low-sodium diet Increase physical activity  Preoperative cardiac evaluation-placement of ventriculoperitoneal shunt for normal pressure hydrocephalus, [facility], fax #[phone]    Primary Cardiologist: Sanda Klein, MD  Chart reviewed as part of pre-operative protocol coverage.  Given past medical history and time since last visit, based on ACC/AHA guidelines, Brandon Marks would be at acceptable risk for the planned procedure without further cardiovascular testing.   Patient with diagnosis of A Fib on Eliquis for anticoagulation.     Procedure: PLACEMENT OF A VENTRICULOPERITONEAL SHUNT FOR NORMAL PRESSURE HYDROCEPHALUS Date of procedure: TBD     CHA2DS2-VASc Score = 3  This indicates a 3.2% annual risk of stroke. [name] score is based upon: CHF History: 1 HTN History: 1 Diabetes History: 0 Stroke History: 0 Vascular Disease History: 0 Age Score: 1 Gender Score: 0  CrCl 110 mL/min Platelet count 216K     Per office protocol, patient can hold Eliquis for 3 days prior to procedure.    Patient was advised that if he develops new symptoms prior to surgery to contact our office to arrange a follow-up appointment.  He verbalized understanding.  I will route this recommendation to the requesting party via Epic fax function and remove from pre-op pool.  Please call with questions.     Follow-up with Dr. Sallyanne Kuster or me in 6-9 months.   Jossie Ng. Jacobey Gura NP-C    05/31/2021, 2:53 PM [address] Office [phone] Fax [phone]  Notice: This dictation was prepared with Dragon dictation along with smaller phrase technology. Any transcriptional errors that result from this process are unintentional and may not be corrected upon review.  I spent 14 minutes examining this patient, reviewing medications, and using patient centered shared decision making involving her cardiac care.  Prior to her visit I spent greater than 20 minutes reviewing her past medical history,  medications, and prior cardiac tests.

## 2021-05-31 ENCOUNTER — Ambulatory Visit (INDEPENDENT_AMBULATORY_CARE_PROVIDER_SITE_OTHER): Payer: Medicare Other | Admitting: General Practice

## 2021-05-31 ENCOUNTER — Encounter: Payer: Self-pay | Admitting: General Practice

## 2021-05-31 ENCOUNTER — Other Ambulatory Visit: Payer: Self-pay

## 2021-05-31 VITALS — BP 122/68 | HR 100 | Ht 74.0 in | Wt 240.0 lb

## 2021-05-31 DIAGNOSIS — E782 Mixed hyperlipidemia: Secondary | ICD-10-CM | POA: Diagnosis not present

## 2021-05-31 DIAGNOSIS — I10 Essential (primary) hypertension: Secondary | ICD-10-CM | POA: Diagnosis not present

## 2021-05-31 DIAGNOSIS — I5032 Chronic diastolic (congestive) heart failure: Secondary | ICD-10-CM

## 2021-05-31 DIAGNOSIS — Z01810 Encounter for preprocedural cardiovascular examination: Secondary | ICD-10-CM

## 2021-05-31 NOTE — Patient Instructions (Signed)
Medication Instructions:  TAKE YOUR FUROSEMIDE (LASIX)  *If you need a refill on your cardiac medications before your next appointment, please call your pharmacy*  Lab Work:   Testing/Procedures:  NONE    NONE  Special Instructions PLEASE READ AND FOLLOW SALTY 6-ATTACHED-1,800mg  daily  PLEASE MAINTAIN PHYSICAL ACTIVITY AS TOLERATED  CLEARED FOR YOUR PROCEDURE/SURGERY  Follow-Up: Your next appointment:  6-9 month(s) In Person with Sanda Klein, MD    Please call our office 2 months in advance to schedule this appointment   At [facility], you and your health needs are our priority.  As part of our continuing mission to provide you with exceptional heart care, we have created designated Provider Care Teams.  These Care Teams include your primary Cardiologist (physician) and Advanced Practice Providers (APPs -  Physician Assistants and Nurse Practitioners) who all work together to provide you with the care you need, when you need it.            6 SALTY THINGS TO AVOID     1,800MG  DAILY

## 2021-06-03 NOTE — Telephone Encounter (Signed)
  [facility] calling, pt was seen on 11/04 and asking if they can get clearance fax to them, she gave new [phone]

## 2021-06-03 NOTE — Telephone Encounter (Signed)
Forwarded Brandon Marks's office note to the new fax number

## 2021-06-05 ENCOUNTER — Inpatient Hospital Stay: Payer: Medicare Other | Admitting: Anesthesiology

## 2021-06-05 ENCOUNTER — Inpatient Hospital Stay
Admission: EM | Admit: 2021-06-05 | Discharge: 2021-06-13 | DRG: 853 | Disposition: A | Discharge status: Home / Self Care | Payer: Medicare Other | Attending: Internal Medicine | Admitting: Internal Medicine

## 2021-06-05 ENCOUNTER — Inpatient Hospital Stay: Payer: Medicare Other

## 2021-06-05 ENCOUNTER — Emergency Department: Payer: Medicare Other

## 2021-06-05 ENCOUNTER — Encounter
Admission: EM | Disposition: A | Discharge status: Home / Self Care | Payer: Self-pay | Source: Home / Self Care | Attending: Internal Medicine

## 2021-06-05 ENCOUNTER — Encounter: Payer: Self-pay | Admitting: Emergency Medicine

## 2021-06-05 DIAGNOSIS — R570 Cardiogenic shock: Secondary | ICD-10-CM | POA: Insufficient documentation

## 2021-06-05 DIAGNOSIS — N1330 Unspecified hydronephrosis: Secondary | ICD-10-CM | POA: Diagnosis not present

## 2021-06-05 DIAGNOSIS — I110 Hypertensive heart disease with heart failure: Secondary | ICD-10-CM | POA: Diagnosis present

## 2021-06-05 DIAGNOSIS — I5021 Acute systolic (congestive) heart failure: Secondary | ICD-10-CM | POA: Diagnosis not present

## 2021-06-05 DIAGNOSIS — N136 Pyonephrosis: Secondary | ICD-10-CM | POA: Diagnosis present

## 2021-06-05 DIAGNOSIS — A4159 Other Gram-negative sepsis: Secondary | ICD-10-CM | POA: Diagnosis present

## 2021-06-05 DIAGNOSIS — Z6831 Body mass index (BMI) 31.0-31.9, adult: Secondary | ICD-10-CM | POA: Diagnosis not present

## 2021-06-05 DIAGNOSIS — G9341 Metabolic encephalopathy: Secondary | ICD-10-CM | POA: Diagnosis present

## 2021-06-05 DIAGNOSIS — I5023 Acute on chronic systolic (congestive) heart failure: Secondary | ICD-10-CM | POA: Diagnosis present

## 2021-06-05 DIAGNOSIS — I4891 Unspecified atrial fibrillation: Secondary | ICD-10-CM | POA: Diagnosis not present

## 2021-06-05 DIAGNOSIS — N35912 Unspecified bulbous urethral stricture, male: Secondary | ICD-10-CM | POA: Diagnosis present

## 2021-06-05 DIAGNOSIS — R4182 Altered mental status, unspecified: Secondary | ICD-10-CM

## 2021-06-05 DIAGNOSIS — R6521 Severe sepsis with septic shock: Secondary | ICD-10-CM | POA: Diagnosis present

## 2021-06-05 DIAGNOSIS — G4733 Obstructive sleep apnea (adult) (pediatric): Secondary | ICD-10-CM | POA: Diagnosis present

## 2021-06-05 DIAGNOSIS — E785 Hyperlipidemia, unspecified: Secondary | ICD-10-CM | POA: Diagnosis present

## 2021-06-05 DIAGNOSIS — I428 Other cardiomyopathies: Secondary | ICD-10-CM

## 2021-06-05 DIAGNOSIS — I4819 Other persistent atrial fibrillation: Secondary | ICD-10-CM | POA: Diagnosis present

## 2021-06-05 DIAGNOSIS — R140 Abdominal distension (gaseous): Secondary | ICD-10-CM

## 2021-06-05 DIAGNOSIS — Z9884 Bariatric surgery status: Secondary | ICD-10-CM | POA: Diagnosis not present

## 2021-06-05 DIAGNOSIS — I248 Other forms of acute ischemic heart disease: Secondary | ICD-10-CM | POA: Diagnosis present

## 2021-06-05 DIAGNOSIS — E7800 Pure hypercholesterolemia, unspecified: Secondary | ICD-10-CM | POA: Diagnosis present

## 2021-06-05 DIAGNOSIS — A419 Sepsis, unspecified organism: Secondary | ICD-10-CM | POA: Diagnosis present

## 2021-06-05 DIAGNOSIS — K5900 Constipation, unspecified: Secondary | ICD-10-CM | POA: Diagnosis present

## 2021-06-05 DIAGNOSIS — I2510 Atherosclerotic heart disease of native coronary artery without angina pectoris: Secondary | ICD-10-CM | POA: Diagnosis present

## 2021-06-05 DIAGNOSIS — Z79899 Other long term (current) drug therapy: Secondary | ICD-10-CM | POA: Diagnosis not present

## 2021-06-05 DIAGNOSIS — Z808 Family history of malignant neoplasm of other organs or systems: Secondary | ICD-10-CM

## 2021-06-05 DIAGNOSIS — R7881 Bacteremia: Secondary | ICD-10-CM | POA: Diagnosis not present

## 2021-06-05 DIAGNOSIS — Z20822 Contact with and (suspected) exposure to covid-19: Secondary | ICD-10-CM | POA: Diagnosis present

## 2021-06-05 DIAGNOSIS — N201 Calculus of ureter: Secondary | ICD-10-CM | POA: Diagnosis present

## 2021-06-05 DIAGNOSIS — Z7901 Long term (current) use of anticoagulants: Secondary | ICD-10-CM

## 2021-06-05 DIAGNOSIS — Z885 Allergy status to narcotic agent status: Secondary | ICD-10-CM

## 2021-06-05 DIAGNOSIS — I5020 Unspecified systolic (congestive) heart failure: Secondary | ICD-10-CM | POA: Diagnosis not present

## 2021-06-05 DIAGNOSIS — E669 Obesity, unspecified: Secondary | ICD-10-CM | POA: Diagnosis not present

## 2021-06-05 DIAGNOSIS — Z8249 Family history of ischemic heart disease and other diseases of the circulatory system: Secondary | ICD-10-CM

## 2021-06-05 DIAGNOSIS — Z96611 Presence of right artificial shoulder joint: Secondary | ICD-10-CM | POA: Diagnosis present

## 2021-06-05 DIAGNOSIS — G912 (Idiopathic) normal pressure hydrocephalus: Secondary | ICD-10-CM | POA: Diagnosis present

## 2021-06-05 DIAGNOSIS — N132 Hydronephrosis with renal and ureteral calculous obstruction: Secondary | ICD-10-CM | POA: Diagnosis not present

## 2021-06-05 DIAGNOSIS — Z9181 History of falling: Secondary | ICD-10-CM

## 2021-06-05 DIAGNOSIS — I10 Essential (primary) hypertension: Secondary | ICD-10-CM | POA: Diagnosis not present

## 2021-06-05 DIAGNOSIS — Z820 Family history of epilepsy and other diseases of the nervous system: Secondary | ICD-10-CM

## 2021-06-05 DIAGNOSIS — E782 Mixed hyperlipidemia: Secondary | ICD-10-CM | POA: Diagnosis not present

## 2021-06-05 DIAGNOSIS — Z96653 Presence of artificial knee joint, bilateral: Secondary | ICD-10-CM | POA: Diagnosis present

## 2021-06-05 DIAGNOSIS — N179 Acute kidney failure, unspecified: Secondary | ICD-10-CM | POA: Diagnosis present

## 2021-06-05 DIAGNOSIS — B961 Klebsiella pneumoniae [K. pneumoniae] as the cause of diseases classified elsewhere: Secondary | ICD-10-CM | POA: Diagnosis not present

## 2021-06-05 DIAGNOSIS — I420 Dilated cardiomyopathy: Secondary | ICD-10-CM | POA: Diagnosis not present

## 2021-06-05 DIAGNOSIS — D62 Acute posthemorrhagic anemia: Secondary | ICD-10-CM | POA: Diagnosis present

## 2021-06-05 DIAGNOSIS — R5381 Other malaise: Secondary | ICD-10-CM | POA: Diagnosis present

## 2021-06-05 DIAGNOSIS — N390 Urinary tract infection, site not specified: Secondary | ICD-10-CM | POA: Diagnosis not present

## 2021-06-05 DIAGNOSIS — D696 Thrombocytopenia, unspecified: Secondary | ICD-10-CM | POA: Diagnosis not present

## 2021-06-05 DIAGNOSIS — R42 Dizziness and giddiness: Secondary | ICD-10-CM | POA: Diagnosis not present

## 2021-06-05 HISTORY — DX: Other persistent atrial fibrillation: I48.19

## 2021-06-05 HISTORY — DX: Benign neoplasm of cranial nerves: D33.3

## 2021-06-05 HISTORY — DX: Personal history of other medical treatment: Z92.89

## 2021-06-05 HISTORY — DX: Cerebrospinal fluid leak, unspecified: G96.00

## 2021-06-05 HISTORY — PX: CYSTOSCOPY WITH STENT PLACEMENT: SHX5790

## 2021-06-05 LAB — BASIC METABOLIC PANEL
Anion gap: 9 (ref 5–15)
BUN: 45 mg/dL — ABNORMAL HIGH (ref 8–23)
CO2: 18 mmol/L — ABNORMAL LOW (ref 22–32)
Calcium: 7.8 mg/dL — ABNORMAL LOW (ref 8.9–10.3)
Chloride: 108 mmol/L (ref 98–111)
Creatinine, Ser: 2.68 mg/dL — ABNORMAL HIGH (ref 0.61–1.24)
GFR, Estimated: 25 mL/min — ABNORMAL LOW (ref >60)
Glucose, Bld: 109 mg/dL — ABNORMAL HIGH (ref 70–99)
Potassium: 3.7 mmol/L (ref 3.5–5.1)
Sodium: 135 mmol/L (ref 135–145)

## 2021-06-05 LAB — CBC
HCT: 41 % (ref 39.0–52.0)
HCT: 35.9 % — ABNORMAL LOW (ref 39.0–52.0)
Hemoglobin: 14.3 g/dL (ref 13.0–17.0)
Hemoglobin: 12.4 g/dL — ABNORMAL LOW (ref 13.0–17.0)
MCH: 31 pg (ref 26.0–34.0)
MCH: 31.5 pg (ref 26.0–34.0)
MCHC: 34.9 g/dL (ref 30.0–36.0)
MCHC: 34.5 g/dL (ref 30.0–36.0)
MCV: 88.9 fL (ref 80.0–100.0)
MCV: 91.1 fL (ref 80.0–100.0)
Platelets: 106 10*3/uL — ABNORMAL LOW (ref 150–400)
Platelets: 86 10*3/uL — ABNORMAL LOW (ref 150–400)
RBC: 4.61 MIL/uL (ref 4.22–5.81)
RBC: 3.94 MIL/uL — ABNORMAL LOW (ref 4.22–5.81)
RDW: 14.4 % (ref 11.5–15.5)
RDW: 14.6 % (ref 11.5–15.5)
WBC: 13.9 10*3/uL — ABNORMAL HIGH (ref 4.0–10.5)
WBC: 28.2 10*3/uL — ABNORMAL HIGH (ref 4.0–10.5)
nRBC: 0 % (ref 0.0–0.2)
nRBC: 0 % (ref 0.0–0.2)

## 2021-06-05 LAB — BLOOD GAS, VENOUS
Acid-base deficit: 6.2 mmol/L — ABNORMAL HIGH (ref 0.0–2.0)
Bicarbonate: 19 mmol/L — ABNORMAL LOW (ref 20.0–28.0)
FIO2: 0.21
O2 Saturation: 89.5 %
Patient temperature: 37
pCO2, Ven: 36 mmHg — ABNORMAL LOW (ref 44.0–60.0)
pH, Ven: 7.33 (ref 7.250–7.430)
pO2, Ven: 62 mmHg — ABNORMAL HIGH (ref 32.0–45.0)

## 2021-06-05 LAB — URINALYSIS, COMPLETE (UACMP) WITH MICROSCOPIC
Bilirubin Urine: NEGATIVE
Glucose, UA: NEGATIVE mg/dL
Ketones, ur: NEGATIVE mg/dL
Nitrite: NEGATIVE
Protein, ur: 100 mg/dL — AB
RBC / HPF: >50 RBC/hpf — ABNORMAL HIGH (ref 0–5)
Specific Gravity, Urine: 1.017 (ref 1.005–1.030)
WBC, UA: >50 WBC/hpf — ABNORMAL HIGH (ref 0–5)
pH: 5 (ref 5.0–8.0)

## 2021-06-05 LAB — COMPREHENSIVE METABOLIC PANEL
ALT: 16 U/L (ref 0–44)
AST: 36 U/L (ref 15–41)
Albumin: 3.1 g/dL — ABNORMAL LOW (ref 3.5–5.0)
Alkaline Phosphatase: 114 U/L (ref 38–126)
Anion gap: 14 (ref 5–15)
BUN: 39 mg/dL — ABNORMAL HIGH (ref 8–23)
CO2: 17 mmol/L — ABNORMAL LOW (ref 22–32)
Calcium: 8.4 mg/dL — ABNORMAL LOW (ref 8.9–10.3)
Chloride: 105 mmol/L (ref 98–111)
Creatinine, Ser: 2.47 mg/dL — ABNORMAL HIGH (ref 0.61–1.24)
GFR, Estimated: 28 mL/min — ABNORMAL LOW (ref >60)
Glucose, Bld: 90 mg/dL (ref 70–99)
Potassium: 3.7 mmol/L (ref 3.5–5.1)
Sodium: 136 mmol/L (ref 135–145)
Total Bilirubin: 1.6 mg/dL — ABNORMAL HIGH (ref 0.3–1.2)
Total Protein: 5.9 g/dL — ABNORMAL LOW (ref 6.5–8.1)

## 2021-06-05 LAB — BRAIN NATRIURETIC PEPTIDE: B Natriuretic Peptide: 738 pg/mL — ABNORMAL HIGH (ref 0.0–100.0)

## 2021-06-05 LAB — LACTIC ACID, PLASMA
Lactic Acid, Venous: 4.6 mmol/L (ref 0.5–1.9)
Lactic Acid, Venous: 3.2 mmol/L (ref 0.5–1.9)
Lactic Acid, Venous: 2.8 mmol/L (ref 0.5–1.9)

## 2021-06-05 LAB — TROPONIN I (HIGH SENSITIVITY)
Troponin I (High Sensitivity): 109 ng/L (ref <18)
Troponin I (High Sensitivity): 102 ng/L (ref <18)

## 2021-06-05 LAB — RESP PANEL BY RT-PCR (FLU A&B, COVID) ARPGX2
Influenza A by PCR: NEGATIVE
Influenza B by PCR: NEGATIVE
SARS Coronavirus 2 by RT PCR: NEGATIVE

## 2021-06-05 LAB — TSH: TSH: 1.493 u[IU]/mL (ref 0.350–4.500)

## 2021-06-05 LAB — GLUCOSE, CAPILLARY: Glucose-Capillary: 95 mg/dL (ref 70–99)

## 2021-06-05 LAB — CBG MONITORING, ED: Glucose-Capillary: 91 mg/dL (ref 70–99)

## 2021-06-05 LAB — PROCALCITONIN: Procalcitonin: 64.57 ng/mL

## 2021-06-05 LAB — MAGNESIUM: Magnesium: 2.1 mg/dL (ref 1.7–2.4)

## 2021-06-05 LAB — CREATININE, SERUM
Creatinine, Ser: 2.41 mg/dL — ABNORMAL HIGH (ref 0.61–1.24)
GFR, Estimated: 29 mL/min — ABNORMAL LOW (ref >60)

## 2021-06-05 LAB — LIPASE, BLOOD: Lipase: 19 U/L (ref 11–51)

## 2021-06-05 LAB — AMMONIA: Ammonia: 35 umol/L (ref 9–35)

## 2021-06-05 LAB — MRSA NEXT GEN BY PCR, NASAL: MRSA by PCR Next Gen: NOT DETECTED

## 2021-06-05 SURGERY — CYSTOSCOPY, WITH STENT INSERTION
Anesthesia: General | Site: Ureter | Laterality: Left

## 2021-06-05 MED ORDER — ONDANSETRON HCL 4 MG/2ML IJ SOLN: 4.0000 mg | Freq: Once | INTRAMUSCULAR | Status: DC | PRN

## 2021-06-05 MED ORDER — PROPOFOL 10 MG/ML IV BOLUS
INTRAVENOUS | Status: DC | PRN
  Administered 2021-06-05: 80 ug/kg/min via INTRAVENOUS

## 2021-06-05 MED ORDER — AMIODARONE HCL IN DEXTROSE 360-4.14 MG/200ML-% IV SOLN
60.0000 mg/h | INTRAVENOUS | Status: AC
  Filled 2021-06-05 (×2): qty 200

## 2021-06-05 MED ORDER — FENTANYL CITRATE (PF) 100 MCG/2ML IJ SOLN
INTRAMUSCULAR | Status: DC | PRN
  Administered 2021-06-05 (×2): 25 ug via INTRAVENOUS

## 2021-06-05 MED ORDER — SODIUM CHLORIDE 0.9 % IV SOLN
2.0000 g | Freq: Once | INTRAVENOUS | Status: AC
  Administered 2021-06-05: 2 g via INTRAVENOUS
  Filled 2021-06-05: qty 2

## 2021-06-05 MED ORDER — METRONIDAZOLE 500 MG/100ML IV SOLN
500.0000 mg | Freq: Once | INTRAVENOUS | Status: AC
  Administered 2021-06-05: 500 mg via INTRAVENOUS
  Filled 2021-06-05: qty 100

## 2021-06-05 MED ORDER — FENTANYL CITRATE (PF) 100 MCG/2ML IJ SOLN: 25.0000 ug | INTRAMUSCULAR | Status: DC | PRN

## 2021-06-05 MED ORDER — LACTATED RINGERS IV SOLN
INTRAVENOUS | Status: DC | PRN
Start: 2021-06-05 — End: 2021-06-05

## 2021-06-05 MED ORDER — IOHEXOL 180 MG/ML  SOLN
INTRAMUSCULAR | Status: DC | PRN
  Administered 2021-06-05: 1 mL

## 2021-06-05 MED ORDER — VANCOMYCIN HCL 2000 MG/400ML IV SOLN
2000.0000 mg | Freq: Once | INTRAVENOUS | Status: AC
  Administered 2021-06-05: 2000 mg via INTRAVENOUS
  Filled 2021-06-05: qty 400

## 2021-06-05 MED ORDER — SODIUM CHLORIDE 0.9 % IV BOLUS
1000.0000 mL | Freq: Once | INTRAVENOUS | Status: AC
  Administered 2021-06-05: 1000 mL via INTRAVENOUS

## 2021-06-05 MED ORDER — ACETAMINOPHEN 325 MG PO TABS
650.0000 mg | ORAL_TABLET | ORAL | Status: DC | PRN
  Administered 2021-06-06 – 2021-06-08 (×4): 650 mg via ORAL
  Filled 2021-06-05 (×4): qty 2

## 2021-06-05 MED ORDER — PHENYLEPHRINE HCL-NACL 20-0.9 MG/250ML-% IV SOLN
0.0000 ug/min | INTRAVENOUS | Status: DC
Start: 2021-06-05 — End: 2021-06-05

## 2021-06-05 MED ORDER — PROPOFOL 10 MG/ML IV BOLUS
INTRAVENOUS | Status: AC
  Filled 2021-06-05: qty 20

## 2021-06-05 MED ORDER — DILTIAZEM HCL 25 MG/5ML IV SOLN
10.0000 mg | Freq: Once | INTRAVENOUS | Status: AC
  Administered 2021-06-05: 10 mg via INTRAVENOUS
  Filled 2021-06-05: qty 5

## 2021-06-05 MED ORDER — POLYETHYLENE GLYCOL 3350 17 G PO PACK
17.0000 g | PACK | Freq: Every day | ORAL | Status: DC | PRN
  Administered 2021-06-11: 17 g via ORAL
  Filled 2021-06-05: qty 1

## 2021-06-05 MED ORDER — SODIUM CHLORIDE 0.9% FLUSH
3.0000 mL | Freq: Two times a day (BID) | INTRAVENOUS | Status: DC
  Administered 2021-06-05 – 2021-06-08 (×5): 3 mL via INTRAVENOUS

## 2021-06-05 MED ORDER — SODIUM CHLORIDE 0.9% FLUSH: 3.0000 mL | INTRAVENOUS | Status: DC | PRN

## 2021-06-05 MED ORDER — DOCUSATE SODIUM 100 MG PO CAPS
100.0000 mg | ORAL_CAPSULE | Freq: Two times a day (BID) | ORAL | Status: DC | PRN
  Administered 2021-06-11: 100 mg via ORAL
  Filled 2021-06-05: qty 1

## 2021-06-05 MED ORDER — AMIODARONE HCL IN DEXTROSE 360-4.14 MG/200ML-% IV SOLN
30.0000 mg/h | INTRAVENOUS | Status: DC
  Administered 2021-06-06 – 2021-06-08 (×6): 30 mg/h via INTRAVENOUS
  Filled 2021-06-05 (×5): qty 200

## 2021-06-05 MED ORDER — SODIUM CHLORIDE 0.9 % IV SOLN: 250.0000 mL | INTRAVENOUS | Status: DC | PRN

## 2021-06-05 MED ORDER — SODIUM CHLORIDE 0.9 % IR SOLN
Status: DC | PRN
  Administered 2021-06-05: 1000 mL

## 2021-06-05 MED ORDER — ONDANSETRON HCL 4 MG/2ML IJ SOLN
INTRAMUSCULAR | Status: DC | PRN
  Administered 2021-06-05: 4 mg via INTRAVENOUS

## 2021-06-05 MED ORDER — AMIODARONE LOAD VIA INFUSION
150.0000 mg | Freq: Once | INTRAVENOUS | Status: AC
  Administered 2021-06-06: 150 mg via INTRAVENOUS
  Filled 2021-06-05: qty 83.34

## 2021-06-05 MED ORDER — SODIUM CHLORIDE 0.9 % IV SOLN
250.0000 mL | INTRAVENOUS | Status: DC
  Administered 2021-06-05: 250 mL via INTRAVENOUS

## 2021-06-05 MED ORDER — CHLORHEXIDINE GLUCONATE CLOTH 2 % EX PADS
6.0000 | MEDICATED_PAD | Freq: Every day | CUTANEOUS | Status: DC
  Administered 2021-06-05 – 2021-06-09 (×5): 6 via TOPICAL

## 2021-06-05 MED ORDER — FENTANYL CITRATE (PF) 100 MCG/2ML IJ SOLN
INTRAMUSCULAR | Status: AC
  Filled 2021-06-05: qty 2

## 2021-06-05 MED ORDER — DILTIAZEM HCL-DEXTROSE 125-5 MG/125ML-% IV SOLN (PREMIX)
5.0000 mg/h | INTRAVENOUS | Status: DC
  Administered 2021-06-05: 5 mg/h via INTRAVENOUS
  Filled 2021-06-05: qty 125

## 2021-06-05 MED ORDER — NOREPINEPHRINE 4 MG/250ML-% IV SOLN
0.0000 ug/min | INTRAVENOUS | Status: DC
  Administered 2021-06-05: 2 ug/min via INTRAVENOUS
  Filled 2021-06-05 (×2): qty 250

## 2021-06-05 MED ORDER — ONDANSETRON HCL 4 MG/2ML IJ SOLN: 4.0000 mg | Freq: Four times a day (QID) | INTRAMUSCULAR | Status: DC | PRN

## 2021-06-05 MED ORDER — HYDROCORTISONE SOD SUC (PF) 100 MG IJ SOLR
100.0000 mg | Freq: Once | INTRAMUSCULAR | Status: AC
  Administered 2021-06-05: 100 mg via INTRAVENOUS
  Filled 2021-06-05: qty 2

## 2021-06-05 MED ORDER — HEPARIN SODIUM (PORCINE) 5000 UNIT/ML IJ SOLN
5000.0000 [IU] | Freq: Three times a day (TID) | INTRAMUSCULAR | Status: DC
  Administered 2021-06-05 – 2021-06-06 (×2): 5000 [IU] via SUBCUTANEOUS
  Filled 2021-06-05 (×2): qty 1

## 2021-06-05 MED ORDER — DILTIAZEM HCL 25 MG/5ML IV SOLN
10.0000 mg | Freq: Once | INTRAVENOUS | Status: AC
  Administered 2021-06-05: 10 mg via INTRAVENOUS

## 2021-06-05 MED ORDER — PHENYLEPHRINE HCL-NACL 20-0.9 MG/250ML-% IV SOLN
25.0000 ug/min | INTRAVENOUS | Status: DC
  Administered 2021-06-05: 25 ug/min via INTRAVENOUS
  Filled 2021-06-05: qty 250

## 2021-06-05 SURGICAL SUPPLY — 27 items
BAG DRAIN CYSTO-URO LG1000N (MISCELLANEOUS) ×3 IMPLANT
BAG DRN LRG CPC RND TRDRP CNTR (MISCELLANEOUS) ×1
BAG URO DRAIN 4000ML (MISCELLANEOUS) ×3 IMPLANT
BRUSH SCRUB EZ 1% IODOPHOR (MISCELLANEOUS) ×3 IMPLANT
CATH FOL 2WAY LX 16X30 (CATHETERS) ×3 IMPLANT
CATH TORCON 5FR 0.38 (CATHETERS) ×3 IMPLANT
CATH URETL OPEN 5X70 (CATHETERS) ×3 IMPLANT
CNTNR SPEC 2.5X3XGRAD LEK (MISCELLANEOUS) ×1
CONT SPEC 4OZ STER OR WHT (MISCELLANEOUS) ×2
CONT SPEC 4OZ STRL OR WHT (MISCELLANEOUS) ×1
CONTAINER SPEC 2.5X3XGRAD LEK (MISCELLANEOUS) ×1 IMPLANT
GAUZE 4X4 16PLY ~~LOC~~+RFID DBL (SPONGE) ×3 IMPLANT
GLOVE SURG ENC MOIS LTX SZ6.5 (GLOVE) ×3 IMPLANT
GOWN STRL REUS W/ TWL LRG LVL3 (GOWN DISPOSABLE) ×2 IMPLANT
GOWN STRL REUS W/TWL LRG LVL3 (GOWN DISPOSABLE) ×6
GUIDEWIRE STR DUAL SENSOR (WIRE) ×3 IMPLANT
IV NS IRRIG 3000ML ARTHROMATIC (IV SOLUTION) ×3 IMPLANT
KIT TURNOVER CYSTO (KITS) ×3 IMPLANT
PACK CYSTO AR (MISCELLANEOUS) ×3 IMPLANT
SET CYSTO W/LG BORE CLAMP LF (SET/KITS/TRAYS/PACK) ×3 IMPLANT
STENT URET 6FRX24 CONTOUR (STENTS) IMPLANT
STENT URET 6FRX26 CONTOUR (STENTS) ×3 IMPLANT
SURGILUBE 2OZ TUBE FLIPTOP (MISCELLANEOUS) ×3 IMPLANT
SYR TOOMEY IRRIG 70ML (MISCELLANEOUS) ×3
SYRINGE TOOMEY IRRIG 70ML (MISCELLANEOUS) ×1 IMPLANT
WATER STERILE IRR 1000ML POUR (IV SOLUTION) ×3 IMPLANT
WATER STERILE IRR 500ML POUR (IV SOLUTION) ×3 IMPLANT

## 2021-06-05 NOTE — ED Notes (Signed)
Pt reporting now a headache that started three mins ago and pain is 2/10

## 2021-06-05 NOTE — H&P (Addendum)
NAME:  Brandon Marks, MRN:  [id number], DOB:  04-Dec-1955, LOS: 0 ADMISSION DATE:  06/05/2021, CONSULTATION DATE:  06/05/2021 REFERRING MD:  Rada Hay, MD  CHIEF COMPLAINT: Rigors and chills  HPI  65 y.o  male with significant PMH of HTN, dCHF, OSA (on CPAP), HLD, atrial fibrillation, Roux-en-Y in 2019, vestibular schwannoma (s/p translab approach for resection on 73/22/0254 complicated by recurrent CSF leak and seizures return to OR for CSF leak repair on 01/06/2020) and high-volume LP on 01/15/2021 at [place] who presented to the ED with chief complaints of rigors and chills.  Patient is a poor historian history mostly obtained from patient's chart and EMS notes.  Per EMS, on arrival patient was complaining of back pain and constipation.  His initial vitals showed heart rate of 50 to 60 bpm with systolic blood pressure between 110s to 120s.  En route to the ED, patient went into A. fib with RVR with heart rate in the 170s.  Denies shortness of breath, chest pain, diarrhea, nausea or vomiting.  ED Course: On arrival to the ED, he was afebrile with blood pressure (!) 74/53 mm Hg and pulse rate(!) 162 beats/min, RR (!) 24,  oxygen saturation 98% on 2 L.  There were no focal neurological deficits; he was alert and oriented x3.  Patient received 1 L bolus of normal saline and 10 mg IV Dilts boluses x 2 with slight improvement in heart rate. Pertinent Labs in Red/Diagnostics Findings: Na+/ K+: 136/3.7 Glucose: 90 BUN/Cr: 39/2.47 Calcium: 8.4 AST/ALT:36/16 Bilirubin: 1.6   WBC/ TMAX: 13.9>28.2/ afebrile Hgb/Hct: 14.3>12.4/41/35.9 Plts: 106>86 PCT: pending Lactic acid: 4.6>3.2 COVID PCR: Negative   Troponin: 109>102 BNP: 738 Venous Blood Gas result:  pO2 62; pCO2 36; pH 7.33;  HCO3 19, %O2 Sat 89.5.   EKG: show atrial fibrillation with rapid ventricular response at 174 bpm with a narrow QRS, normal axis, prolonged QTC otherwise normal intervals, nonspecific ST changes. Imaging : CXR: No  active cardiopulmonary process.  CT abd/pelvis>Acute hydronephrosis on the left. 13 mm stone in the proximal left ureter causing the obstruction.  Patient remained hypotensive despite IV fluid resuscitation therefore was started on Levophed and phenylephrine.  PCCM consulted for admission and further management. Discussed CT findings with on call Urologist Dr. Erlene Quan.  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 Necrotizing fasciitis of the right buttock  Tick Bite  Significant Hospital Events   11/9: Admitted with sepsis with septic shock secondary to urosepsis from obstructing left proximal ureteral calculus.  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 No evidence of residual or recurrent mass. 11/9: CTA abdomen and pelvis>Acute hydronephrosis on the left. 13 mm stone in the proximal left ureter causing the obstruction.   Micro Data:  11/9: SARS-CoV-2 PCR> negative 11/9: Influenza PCR> negative 11/9: Blood culture x2> 11/9: Urine Culture> 11/9: MRSA PCR>>   Antimicrobials:  Vancomycin 11/9> Cefepime 11/9> Metronidazole 11/9>  OBJECTIVE  Blood pressure (!) 140/129, pulse (!) 109, temperature 97.6 F (36.4 C), temperature source Axillary,  resp. rate 20, height 6\' 2"  (1.88 m), weight 109.5 kg, SpO2 99 %.        Intake/Output Summary (Last 24 hours) at 06/05/2021 2107 Last data filed at 06/05/2021 1933 Gross per 24 hour  Intake 2500 ml  Output --   Net 2500 ml   Filed Weights   06/05/21 1347 06/05/21 2030  Weight: 108.8 kg 109.5 kg    Physical Examination  GENERAL: 65 year-old critically ill patient lying in the bed with no acute distress.  EYES: Pupils equal, round, reactive to light and accommodation. No scleral icterus. Extraocular muscles intact.  HEENT: Head atraumatic, normocephalic. Oropharynx and nasopharynx clear.  NECK:  Supple, no jugular venous distention. No thyroid enlargement, no tenderness.  LUNGS: Normal breath sounds bilaterally, no wheezing, rales,rhonchi or crepitation. No use of accessory muscles of respiration.  CARDIOVASCULAR: S1, S2 normal. No murmurs, rubs, or gallops.  ABDOMEN: Soft, nontender, nondistended. Bowel sounds present. No organomegaly or mass.  EXTREMITIES: No pedal edema, cyanosis, or clubbing.  NEUROLOGIC: Cranial nerves II through XII are intact.  Muscle strength not assessed. Sensation intact. Gait not checked.  PSYCHIATRIC: The patient is alert and oriented x 2.  Lethargic but able to follow commands SKIN: No obvious rash, lesion, or ulcer.   Labs/imaging that I havepersonally reviewed  (right click and "Reselect all SmartList Selections" daily)     Labs   CBC: Recent Labs  Lab 06/05/21 1350 06/05/21 1811  WBC 13.9* 28.2*  HGB 14.3 12.4*  HCT 41.0 35.9*  MCV 88.9 91.1  PLT 106* 86*    Basic Metabolic Panel: Recent Labs  Lab 06/05/21 1350 06/05/21 1811  NA 136  --   K 3.7  --   CL 105  --   CO2 17*  --   GLUCOSE 90  --   BUN 39*  --   CREATININE 2.47* 2.41*  CALCIUM 8.4*  --    GFR: Estimated Creatinine Clearance: 39.2 mL/min (A) (by C-G formula based on SCr of 2.41 mg/dL (H)). Recent Labs  Lab 06/05/21 1350 06/05/21 1552 06/05/21 1811  WBC 13.9*  --  28.2*  LATICACIDVEN  --  4.6* 3.2*    Liver Function Tests: Recent Labs  Lab 06/05/21 1350  AST 36  ALT 16  ALKPHOS 114  BILITOT 1.6*  PROT 5.9*  ALBUMIN 3.1*   No results for input(s): LIPASE,  AMYLASE in the last 168 hours. No results for input(s): AMMONIA in the last 168 hours.  ABG No results found for: PHART, PCO2ART, PO2ART, HCO3, TCO2, ACIDBASEDEF, O2SAT   Coagulation Profile: No results for input(s): INR, PROTIME in the last 168 hours.  Cardiac Enzymes: No results for input(s): CKTOTAL, CKMB, CKMBINDEX, TROPONINI in the last 168 hours.  HbA1C: No results found for: HGBA1C  CBG: Recent Labs  Lab 06/05/21 1350 06/05/21 2029  GLUCAP 91 95    Review of Systems:   UNABLE TO OBTAIN FROM PATIENT WHO ALTERED AND POOR HISTORIAN  Past Medical History  He,  has a past medical history of Arthritis, Herniated disc, High cholesterol, Hypertension, Morbid obesity ([place]), PAC (premature atrial contraction), and Sleep apnea.   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 at [place]  02/20/2009   normal coronary arteries   EYE SURGERY     bilateral cataract with lens implant   INCISION AND DRAINAGE PERIRECTAL ABSCESS N/A 01/02/2018   Procedure: IRRIGATION AND DEBRIDEMENT  PERIRECTAL ABSCESS;  Surgeon: Coralie Keens, MD;  Location: [place];  Service: General;  Laterality: N/A;   IRRIGATION AND DEBRIDEMENT BUTTOCKS N/A 01/05/2018   Procedure: DEBRIDEMENT BUTTOCKS ABSCESS;  Surgeon: Georganna Skeans, MD;  Location: [place];  Service: General;  Laterality: N/A;   KNEE ARTHROSCOPY  2010   Lt   NM MYOCAR PERF WALL MOTION  09/28/2007   small area of reversibility in the anterolateral wall at the apex concerning for ischemia   SHOULDER ARTHROSCOPY  4/12   Rt   TOTAL KNEE ARTHROPLASTY Left 08/11/2019   Procedure: TOTAL KNEE ARTHROPLASTY;  Surgeon: Paralee Cancel, MD;  Location: [facility];  Service: Orthopedics;  Laterality: Left;  70 mins   TOTAL KNEE ARTHROPLASTY Right 09/13/2019   Procedure: TOTAL KNEE ARTHROPLASTY;  Surgeon: Paralee Cancel, MD;  Location: [facility];  Service: Orthopedics;  Laterality: Right;   70 mins   WOUND DEBRIDEMENT N/A 01/04/2018   Procedure: IRRIGATION AND DEBRIDEMENT OF BUTTOCKS AND REMOVAL OF TICK FROM RIGHT TESTICLE;  Surgeon: Georganna Skeans, MD;  Location: [place];  Service: General;  Laterality: N/A;     Social History   reports that he has never smoked. He has never used smokeless tobacco. He reports that he does not drink alcohol and does not use drugs.   Family History   His family history includes Alzheimer's disease in his maternal grandfather, maternal grandmother, and mother; Heart disease in his father; Heart failure in his mother; Hypertension in his mother; Skin cancer in his mother.   Allergies Allergies  Allergen Reactions   Codeine Nausea And Vomiting   Oxycodone Other (See Comments)    Upset GI     Home Medications  Prior to Admission medications   Medication Sig Start Date End Date Taking? Authorizing Provider  apixaban (ELIQUIS) 5 MG TABS tablet Take 1 tablet (5 mg total) by mouth 2 (two) times daily. 03/22/21  Yes Deberah Pelton, NP  acetaminophen (TYLENOL) 325 MG tablet as needed for headache. Patient not taking: Reported on 06/05/2021    [provider]  cyanocobalamin 1000 MCG tablet Take 1 tablet by mouth daily. Patient not taking: Reported on 06/05/2021    [provider]  diltiazem (CARDIZEM CD) 180 MG 24 hr capsule Take 1 capsule (180 mg total) by mouth daily. Patient not taking: Reported on 05/31/2021 04/01/21 05/31/21  Gareth Morgan, MD  furosemide (LASIX) 20 MG tablet Take 1 tablet (20 mg total) by mouth daily as needed for edema (for swelling in legs/ankle). Patient not taking: No sig reported 11/15/20   Ladell Pier, MD  Magnesium 250 MG TABS Take by mouth daily. Patient not taking: Reported on 06/05/2021    [provider]  vitamin B-12 (CYANOCOBALAMIN) 500 MCG tablet Take 500 mcg by mouth daily. Patient not taking: Reported on 06/05/2021    [provider]  vitamin C (ASCORBIC ACID) 500 MG  tablet Take 500 mg by mouth daily. Patient not taking: Reported on 06/05/2021    [provider]  Scheduled Meds:  [MAR Hold] amiodarone  150 mg Intravenous Once   [MAR Hold] Chlorhexidine Gluconate Cloth  6 each Topical Q0600   [MAR Hold] heparin  5,000 Units Subcutaneous Q8H   [MAR Hold] sodium chloride flush  3 mL Intravenous Q12H   Continuous Infusions:  sodium chloride 250 mL (06/05/21 1704)   [MAR Hold] sodium chloride     [MAR Hold] amiodarone Stopped (06/05/21 1906)   Followed by   [OYD Hold] amiodarone  norepinephrine (LEVOPHED) Adult infusion 7 mcg/min (06/05/21 1638)   [MAR Hold] phenylephrine (NEO-SYNEPHRINE) Adult infusion 35 mcg/min (06/05/21 1729)   PRN Meds:.[MAR Hold] sodium chloride, [MAR Hold] acetaminophen, [MAR Hold] docusate sodium, [MAR Hold] ondansetron (ZOFRAN) IV, [MAR Hold] polyethylene glycol, [MAR Hold] sodium chloride flush  Active Hospital Problem list   Sepsis with septic shock Urosepsis AKI Lactic acidosis Chronic Diastolic CHF OSA on CPAP  Assessment & Plan:  Sepsis with Septic Shock Secondary to Obstructive Uropathy Lactic: 4.6, Baseline PCT: 64.57, UA: pending, CXR: No active Disease, CT: Obstructive stone  Initial interventions/workup included: 2 L of NS & Cefepime/ Vancomycin/ Metronidazole -Supplemental oxygen as needed, to maintain SpO2 > 90% -Follow Blood and Urine cultures, trend lactic/ PCT -Monitor WBC/ fever curve -IV antibiotics: Will start Ceftriaxone pending cultures -IVF hydration as needed -Continue vasopressors to maintain MAP> 65 -Strict I/O's: Goal UOP > 0.5 mL/kg/hr -Stress dose steroids hydrocortisone 100 x 1 dose , will schedule if hypotension persistent despite source control  Obstructive Uropathy secondary to Left Proximal ureteral Calculus S/p Left ureteral stent placement -Follow UA and Urine Cx -Foley in place for decompression -PRN pain management -IV Abx as above pending cultures -Urology  following, appreciate input  Acute Kidney Injury Postrenal in the setting of obstructive uropathy Lactic Acidosis -Trend Lactate -Monitor I&O's / urinary output -Follow BMP -Ensure adequate renal perfusion -Avoid nephrotoxic agents as able -Replace electrolytes as indicated  Acute Encephalopathy likely multifactorial in the setting of sepsis  and Underlying ventriculomegaly PMHx: vestibular schwannoma (s/p resection on 82/95/6213 complicated by recurrent CSF leak and seizures, Normal Pressure Hydrocephalus pending VP shunt placement Per [facility]. -CT head with worsening hydrocephalus, consider [facility] consult. Patient follows with Dr. Deatra Canter Z[place]orodi at [place] pending -Provide supportive care -Neuro checks per protocol -Seizure precautions   Atrial Fibrillation with RVR Hx: HLD, Chronic Diastolic CHF (last known EF 62%) BNP slightly Elevated, Hypertension, OSA on CPAP -Continuous cardiac monitoring -Maintain MAP greater than 65 -Hold Eliquis, will resume postop -Amiodarone for rate control -Hold Lasix for now. Resume as blood pressure and renal function permits   Best practice:  Diet:  NPO Pain/Anxiety/Delirium protocol (if indicated): No VAP protocol (if indicated): Not indicated DVT prophylaxis: Systemic AC GI prophylaxis: PPI Glucose control:  SSI No Central venous access:  N/A Arterial line:  N/A Foley:  Yes, and it is still needed Mobility:  bed rest  PT consulted: N/A Last date of multidisciplinary goals of care discussion [11/9] Code Status:  full code Disposition: ICU   = Goals of Care = Code Status Order: FULL  Primary Emergency Contact: [place] Wishes to pursue full aggressive treatment and intervention options, including CPR and intubation, but goals of care will be addressed on going with family if that should become necessary.   Critical care time: 45 minutes     Rufina Falco, DNP, CCRN, FNP-C, AGACNP-BC Acute  Care Nurse Practitioner  [facility] Pager: [phone] [place] [facility]  .

## 2021-06-05 NOTE — ED Notes (Signed)
Critical lab result: Troponin 109 critical result . EDP notified.

## 2021-06-05 NOTE — ED Notes (Signed)
Pt  turned self to Rt side.

## 2021-06-05 NOTE — ED Notes (Signed)
MD McHugh notified of Lac of 4.6 at this time.

## 2021-06-05 NOTE — ED Notes (Signed)
Drips and IV lines checked.

## 2021-06-05 NOTE — Progress Notes (Signed)
Patient arrived to the unit lethargic but alert & oriented x 3 and able to follow commands. Levo & Neo drips infusing with first BP noted elevated. Neo drip was turned off. Patient CT results noted a 13 mm stone obstructing the ureter. Brandon Mola, NP consulted with Urology. Patient transported to the OR emergently without incident for stent placement.

## 2021-06-05 NOTE — ED Notes (Signed)
To CT

## 2021-06-05 NOTE — ED Notes (Signed)
MD Starleen Blue at bedside, instructed this RN to hold Amio gtt and bolus at this time.

## 2021-06-05 NOTE — ED Provider Notes (Signed)
Patient signed out to me by Dr. Kerman Passey at 3 PM.  In brief he is a 65 year old male with history of atrial fibrillation on Eliquis, diastolic heart failure hyperlipidemia hypertension who presented to the ED after he had shaking spells at home and generally feeling unwell.  Apparently his heart rate was normal with EMS however on arrival he is in A. fib with RVR and hypotensive.  He was given a liter of fluids as well as 2 dilt pushes with only minimal improvement in his heart rate but blood pressure did not improve.  Patient's blood work is notable for significant AKI with a creatinine of 2.47 from around 0.9, elevated troponin and mildly elevated white count at 13.  Her my evaluation patient overall does not appear well but he is awake and alert and denies any complaints.  He denies abdominal pain shortness of breath cough congestion urinary symptoms.  He is hypotensive and tachycardic he is on a dill drip around 5 and heart rates are in the 120s blood pressure with maps around 60s.  He does look dry to me so he was given another liter of fluid but remained hypotensive so was started on Levophed.  This did increase his tachycardia so he was also started on phenylephrine.  I added on lactate UA and CT scans of his chest and abdomen although on my exam he has no abdominal tenderness.  His lactate is elevated at 4.6.  We will cover broadly with antibiotics.  Blood cultures pending and urine culture and UA still pending as well.  Will discuss with the intensivist and admit to the ICU.  CRITICAL CARE Performed by: Madelin Headings   Total critical care time: 45 minutes  Critical care time was exclusive of separately billable procedures and treating other patients.  Critical care was necessary to treat or prevent imminent or life-threatening deterioration.  Critical care was time spent personally by me on the following activities: development of treatment plan with patient and/or surrogate as well as  nursing, discussions with consultants, evaluation of patient's response to treatment, examination of patient, obtaining history from patient or surrogate, ordering and performing treatments and interventions, ordering and review of laboratory studies, ordering and review of radiographic studies, pulse oximetry and re-evaluation of patient's condition.    Rada Hay, MD 06/05/21 [phone]

## 2021-06-05 NOTE — Anesthesia Preprocedure Evaluation (Signed)
Anesthesia Evaluation  Patient identified by MRN, date of birth, ID band Patient awake    Reviewed: Allergy & Precautions, H&P , NPO status , Patient's Chart, lab work & pertinent test results  History of Anesthesia Complications Negative for: history of anesthetic complications  Airway Mallampati: II  TM Distance: >3 FB Neck ROM: Full    Dental  (+) Dental Advisory Given, Poor Dentition   Pulmonary neg shortness of breath, sleep apnea , neg COPD,    Pulmonary exam normal breath sounds clear to auscultation       Cardiovascular hypertension, Pt. on medications (-) angina+CHF  + dysrhythmias Atrial Fibrillation  Rhythm:irregular Rate:Abnormal     Neuro/Psych Patient with AMS, but arousable after significant stimulation.  Patient appears to be A&Ox3 when he is awake.  Per neurology this is his baseline. negative psych ROS   GI/Hepatic negative GI ROS, Neg liver ROS,   Endo/Other  negative endocrine ROS  Renal/GU ARFRenal disease (kidney stone)  negative genitourinary   Musculoskeletal  (+) Arthritis , Osteoarthritis,    Abdominal   Peds  Hematology negative hematology ROS (+)   Anesthesia Other Findings Past Medical History: No date: Arthritis No date: Herniated disc No date: High cholesterol No date: Hypertension No date: Morbid obesity ([place]) No date: PAC (premature atrial contraction) No date: Sleep apnea     Comment:  non compliant with c-pap   Reproductive/Obstetrics negative OB ROS                             Anesthesia Physical  Anesthesia Plan  ASA: III and emergent  Anesthesia Plan: General   Post-op Pain Management:    Induction: Intravenous  PONV Risk Score and Plan: 3 and Treatment may vary due to age or medical condition, TIVA, Ondansetron, Dexamethasone and Propofol infusion  Airway Management Planned: Natural Airway  Additional Equipment:   Intra-op Plan:    Post-operative Plan: Extubation in OR  Informed Consent: I have reviewed the patients History and Physical, chart, labs and discussed the procedure including the risks, benefits and alternatives for the proposed anesthesia with the patient or authorized representative who has indicated his/her understanding and acceptance.     Dental advisory given  Plan Discussed with: CRNA and Surgeon  Anesthesia Plan Comments: (Emergent procedure for obstructing stone and acute renal failure.  Patient states that he is NPO appropriate at this time.  Patient was initially hypotensive, but this has been treated with fluid resuscitation and patient is on a Norepi drip at 10.  Patient is difficult to arouse and does not have next of kin/guardian that are/is easily reachable at this time, so will have Dr. Erlene Quan co-sign the consent as the procedure is emergent.  Dr. Erlene Quan is confident that she will be able to do this procedure with minimal sedation, so we will plan for a low dose propofol drip with some additional sedatives (precedex, etomidate, fentanyl, etc..).)        Anesthesia Quick Evaluation

## 2021-06-05 NOTE — ED Notes (Addendum)
Pt reports has not been eating or drinking much for the past few days. No nausea or vomiting. Pt denies chest pain. Pt c/o constipation and has not had a bowel movement in at least five days.

## 2021-06-05 NOTE — ED Notes (Signed)
Assisted patient with phone so he could call Whitney (unknown relation) to inform her via voicemail he was being hospitalized at [facility] Pt unable to tell me relation for contact. Phone number added to contacts.

## 2021-06-05 NOTE — ED Provider Notes (Signed)
[facility] Emergency Department Provider Note  Time seen: 3:12 PM  I have reviewed the triage vital signs and the nursing notes.   HISTORY  Chief Complaint Weakness  HPI Brandon Marks is a 65 y.o. male with a past medical history of arthritis, hypertension, hyperlipidemia, atrial fibrillation on Eliquis, CHF, presents to the emergency department for not feeling well.  According to the patient he was at home not feeling well and "shaking" on his bed.  Patient is not sure why he was shaking is not sure if he was having chills if he was cold if he had a fever, etc.  EMS transported the patient to the emergency department with a heart rate in the 80s however just upon arrival patient's heart rate jumped to 170 or so.  Patient denies any chest pain denies any shortness of breath.  Patient states he feels like he needs to have a bowel movement, but denies any nausea or vomiting.  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[place]) 02/22/2021   23-polyvalent pneumococcal polysaccharide vaccine declined 02/22/2021   Benign neoplasm of brain, unspecified brain region [facility]) 11/15/2020   Status post excision of acoustic neuroma 02/07/2020   S/P right TKA 09/13/2019   S/P left TKA 08/11/2019   Genu varum of both lower extremities 06/13/2019   Vitamin B12 deficiency 06/13/2019   Obesity (BMI 30-39.9) 12/27/2018   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   Tinnitus of right ear 12/27/2018   Gait disturbance 12/27/2018   Normochromic anemia 12/27/2018   Primary osteoarthritis of both knees 37/85/8850   Diastolic heart failure ([place]) 09/11/2017   DDD (degenerative disc disease), cervical 09/04/2017    Dyspnea, chronic DOE 02/27/2012   Herniated disc    Hyperlipidemia 27/74/1287   Diastolic dysfunction, left ventricle 02/23/2009   Essential hypertension 10/18/2007   OSA on CPAP 10/11/2007    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 at [place]  02/20/2009   normal coronary arteries   EYE SURGERY     bilateral cataract with lens implant   INCISION AND DRAINAGE PERIRECTAL ABSCESS N/A 01/02/2018   Procedure: IRRIGATION AND DEBRIDEMENT PERIRECTAL ABSCESS;  Surgeon: Coralie Keens, MD;  Location: [place];  Service: General;  Laterality: N/A;   IRRIGATION AND DEBRIDEMENT BUTTOCKS N/A 01/05/2018   Procedure: DEBRIDEMENT BUTTOCKS ABSCESS;  Surgeon: Georganna Skeans, MD;  Location: [place];  Service: General;  Laterality: N/A;   KNEE ARTHROSCOPY  2010   Lt   NM MYOCAR PERF WALL MOTION  09/28/2007   small area of reversibility in the anterolateral wall at the apex concerning for ischemia   SHOULDER ARTHROSCOPY  4/12   Rt   TOTAL KNEE ARTHROPLASTY Left 08/11/2019   Procedure: TOTAL KNEE ARTHROPLASTY;  Surgeon: Paralee Cancel, MD;  [facility];  Service: Orthopedics;  Laterality: Left;  70 mins   TOTAL KNEE ARTHROPLASTY Right 09/13/2019   Procedure: TOTAL KNEE ARTHROPLASTY;  Surgeon: Paralee Cancel, MD;  [facility];  Service: Orthopedics;  Laterality: Right;  70 mins   WOUND DEBRIDEMENT N/A 01/04/2018   Procedure: IRRIGATION AND DEBRIDEMENT  OF BUTTOCKS AND REMOVAL OF TICK FROM RIGHT TESTICLE;  Surgeon: Georganna Skeans, MD;  Location: [place];  Service: General;  Laterality: N/A;    Prior to Admission medications   Medication Sig Start Date End Date Taking? Authorizing Provider  acetaminophen (TYLENOL) 325 MG tablet as needed for headache.    [provider]  apixaban (ELIQUIS) 5 MG TABS tablet Take 1 tablet (5 mg total) by mouth 2 (two) times daily. 03/22/21   Deberah Pelton, NP  cyanocobalamin 1000 MCG tablet Take 1  tablet by mouth daily.    [provider]  diltiazem (CARDIZEM CD) 180 MG 24 hr capsule Take 1 capsule (180 mg total) by mouth daily. Patient not taking: Reported on 05/31/2021 04/01/21 05/31/21  Gareth Morgan, MD  furosemide (LASIX) 20 MG tablet Take 1 tablet (20 mg total) by mouth daily as needed for edema (for swelling in legs/ankle). Patient not taking: Reported on 05/31/2021 11/15/20   Ladell Pier, MD  Magnesium 250 MG TABS Take by mouth daily.    [provider]  vitamin B-12 (CYANOCOBALAMIN) 500 MCG tablet Take 500 mcg by mouth daily.    [provider]  vitamin C (ASCORBIC ACID) 500 MG tablet Take 500 mg by mouth daily.    [provider]    Allergies  Allergen Reactions   Codeine Nausea And Vomiting   Oxycodone Other (See Comments)    Upset GI    Family History  Problem Relation Age of Onset   Hypertension Mother    Heart failure Mother    Alzheimer's disease Mother    Skin cancer Mother    Heart disease Father    Alzheimer's disease Maternal Grandmother    Alzheimer's disease Maternal Grandfather     Social History Social History   Tobacco Use   Smoking status: Never   Smokeless tobacco: Never  Vaping Use   Vaping Use: Never used  Substance Use Topics   Alcohol use: No   Drug use: No    Review of Systems Constitutional: Negative for fever.  Positive for shaking.  Positive for not feeling well. Cardiovascular: Negative for chest pain. Respiratory: Negative for shortness of breath. Gastrointestinal: Negative for abdominal pain, vomiting.  Urge to have a bowel movement. Genitourinary: Negative for urinary compaints Musculoskeletal: Negative for musculoskeletal complaints Neurological: Negative for headache All other ROS negative  ____________________________________________   PHYSICAL EXAM:  VITAL SIGNS: ED Triage Vitals  Enc Vitals Group     BP 06/05/21 1348 (!) 74/53     Pulse Rate 06/05/21 1348 (!) 162      Resp 06/05/21 1348 (!) 24     Temp 06/05/21 1348 98.6 F (37 C)     Temp Source 06/05/21 1348 Oral     SpO2 06/05/21 1348 98 %     Weight 06/05/21 1347 239 lb 13.8 oz (108.8 kg)     Height 06/05/21 1347 6\' 2"  (1.88 m)     Head Circumference --      Peak Flow --      Pain Score 06/05/21 1347 0     Pain Loc --      Pain Edu? --      Excl. in [place]? --    Constitutional: Alert and oriented. Well appearing and in no distress. Eyes: Normal exam ENT      Head: Normocephalic and atraumatic.      Mouth/Throat: Mucous membranes are moist. Cardiovascular: Normal rate, regular rhythm.  Respiratory: Normal respiratory effort without  tachypnea nor retractions. Breath sounds are clear  Gastrointestinal: Soft and nontender. No distention. Musculoskeletal: Nontender with normal range of motion in all extremities. Neurologic:  Normal speech and language. No gross focal neurologic deficits  Skin:  Skin is warm, dry and intact.  Psychiatric: Mood and affect are normal.   ____________________________________________    EKG  EKG viewed and interpreted by myself appears to show atrial fibrillation with rapid ventricular response at 174 bpm with a narrow QRS, normal axis, prolonged QTC otherwise normal intervals, nonspecific ST changes.  ____________________________________________    RADIOLOGY  Chest x-ray is normal  ____________________________________________   INITIAL IMPRESSION / ASSESSMENT AND PLAN / ED COURSE  Pertinent labs & imaging results that were available during my care of the patient were reviewed by me and considered in my medical decision making (see chart for details).   Patient presents to the emergency department by EMS for "shaking" and not feeling well.  EMS states throughout the transport the patient's heart rate remained around 80 bpm and just upon arrival increased to 170 bpm.  Here patient appears to be in A. fib in the rapid ventricular response from 170 bpm initial  blood pressure quite hypotensive around 72 systolic.  Patient given 1 L bolus of normal saline which did increase blood pressure.  Has received 210 mg IV diltiazem boluses which did help briefly with his heart rate however blood pressure has also come down a bit as well.  Current MAP around 75.  Patient denies any chest pain however troponin is found to be 109.  Has no complaints of abdominal pain.  No nausea vomiting diarrhea fever cough or congestion.  Patient states he is not really sure why he came besides that he was "shaking" and feeling unwell.  Troponin elevation I feel is much more likely to be related to the atrial fibrillation with rapid ventricular sponsor/demand ischemia than true ACS.  We will repeat a troponin at the 2-hour mark, if not significantly elevated I do not believe the patient would benefit from heparin, if it does elevate significantly we will heparinize.  Patient is anticoagulated with Eliquis.  [name] was evaluated in Emergency Department on 06/05/2021 for the symptoms described in the history of present illness. He was evaluated in the context of the global COVID-19 pandemic, which necessitated consideration that the patient might be at risk for infection with the SARS-CoV-2 virus that causes COVID-19. Institutional protocols and algorithms that pertain to the evaluation of patients at risk for COVID-19 are in a state of rapid change based on information released by regulatory bodies including the CDC and federal and state organizations. These policies and algorithms were followed during the patient's care in the ED.  CRITICAL CARE Performed by: Harvest Dark   Total critical care time: 45 minutes  Critical care time was exclusive of separately billable procedures and treating other patients.  Critical care was necessary to treat or prevent imminent or life-threatening deterioration.  Critical care was time spent personally by me on the following activities:  development of treatment plan with patient and/or surrogate as well as nursing, discussions with consultants, evaluation of patient's response to treatment, examination of patient, obtaining history from patient or surrogate, ordering and performing treatments and interventions, ordering and review of laboratory studies, ordering and review of radiographic studies, pulse oximetry and re-evaluation of patient's condition.  ____________________________________________   FINAL CLINICAL IMPRESSION(S) / ED DIAGNOSES  Atrial fibrillation with rapid ventricular response   Harvest Dark, MD 06/05/21  1540  

## 2021-06-05 NOTE — ED Notes (Signed)
Pt in bed talking with this nurse about Voting.

## 2021-06-05 NOTE — ED Notes (Signed)
Placed bedpan under patient due to need for defecation. Patient had small amount of soft stool underneath on diaper. Cleaned patient with wipes and placed brief.

## 2021-06-05 NOTE — ED Notes (Signed)
Pt placed on bedpan and had substantial bowel movement at this time. Pt cleaned up, fresh brief placed on pt, chuck pad placed, pt repositioned, and reconnected to all monitoring equipment.

## 2021-06-05 NOTE — Op Note (Signed)
Date of procedure: 06/05/21  Preoperative diagnosis:  Left proximal ureteral stone Sepsis or urinary source    Postoperative diagnosis:  As above   Procedure: Left ureteral stent placement  Surgeon: Hollice Espy, MD  Anesthesia: MAC  Complications: None  Intraoperative findings: Elevated bladder neck with cobblestoning along trigone, bladder erythema with debris and very dark concentrated appearing urine.  E flux of some debris upon stent placement.  Some difficulty with stent placement secondary to bladder neck /J hooking of the distal ureter but ultimately successful.  EBL: minimal  Specimens: UA/urine culture  Drains: 6 x 26 French double-J ureteral stent, 16 French Foley catheter  Indication: Brandon Marks is a 65 y.o. patient with 13 mm left proximal ureteral calculus likely the source of his sepsis.  Unfortunately, he is unable to consent today, see H&P and he has no next of kin.  As such, he was taken emergently as a lifesaving procedure.  After reviewing the management options for treatment, he elected to proceed with the above surgical procedure(s). We have discussed the potential benefits and risks of the procedure, side effects of the proposed treatment, the likelihood of the patient achieving the goals of the procedure, and any potential problems that might occur during the procedure or recuperation. Informed consent has been obtained.  Description of procedure:  The patient was taken to the operating room and light sedation was administered.  The patient was placed in the dorsal lithotomy position, prepped and draped in the usual sterile fashion, and preoperative antibiotics were administered. A preoperative time-out was performed.   A 21 French the scope was advanced per urethra into the bladder.  Mild bulbar urethral stricture but easily passable with a 21 French cystoscope.  Upon entering the bladder, is noted to be consistent with acute cystitis, diffuse erythema  with some cobblestoning along the trigone.  Elevated bladder neck.  His urine was very concentrated and cloudy.  UA and urine culture was obtained.  On scout imaging, the proximal ureteral stone was easily seen.  I first attempted to cannulate the UO using 5 [place] open-ended ureteral catheter and a sensor wire unsuccessfully due to the angulation and J hooking of the distal ureter.  Ultimately, with some maneuvering, I was able to use a [place] catheter to achieve the proper angle and a sensor wire bypassing the stone which curled presumably in the renal pelvis.  I did not end up shooting a retrograde pyelogram.  Upon placing the wire, there is a flux of cloudy urine.  Then advanced a 6 x 26 French double-J ureteral stent over the wire up to the level of the kidney.  At this point, the wire was withdrawn and a coil was noted both within the renal pelvis as well as in the bladder.  The bladder was then drained.  A 16 French Foley catheter was placed for maximal urinary decompression overnight.  He was then taken to the PACU in unchanged condition, remained on 10 mics of Levophed and remained stable throughout the procedure.  Hollice Espy, M.D.

## 2021-06-05 NOTE — Progress Notes (Signed)
Patient arrived back to the unit with VSS, follow commands in stable condition. Denies pain at this time. Foley catheter in place with hematuria noted. Will continue to monitor.

## 2021-06-05 NOTE — Transfer of Care (Signed)
Immediate Anesthesia Transfer of Care Note  Patient: Brandon Marks  Procedure(s) Performed: CYSTOSCOPY WITH STENT PLACEMENT (Left: Ureter)  Patient Location: PACU  Anesthesia Type:General  Level of Consciousness: responds to stimulation  Airway & Oxygen Therapy: Patient Spontanous Breathing and Patient connected to face mask oxygen  Post-op Assessment: Report given to RN  Post vital signs: stable  Last Vitals:  Vitals Value Taken Time  BP 83/70 06/05/21 2235  Temp    Pulse 91 06/05/21 2238  Resp 15 06/05/21 2238  SpO2 100 % 06/05/21 2238  Vitals shown include unvalidated device data.  Last Pain:  Vitals:   06/05/21 2030  TempSrc: Axillary  PainSc:          Complications: No notable events documented.

## 2021-06-05 NOTE — ED Notes (Signed)
This RN verified with MD Starleen Blue to continue Dilt gtt until Amio gtt is received from pharmacy

## 2021-06-05 NOTE — OR Nursing (Signed)
Brandon Marks in lab called about labs sent. Confirmed urine analysis and culture to be performed. MD also initiated orders. [employer] confirmed that urine culture and analysis would be performed.

## 2021-06-05 NOTE — ED Notes (Signed)
Pt on Rt side for comfort, but BP drops. When reassessed MAP is above 65

## 2021-06-05 NOTE — Consult Note (Signed)
Urology Consult  I have been asked to see the patient by Dr. Nobie Putnam, for evaluation and management of obstructing left ureteral calculus.  Chief Complaint: Hypotension/rigors  History of Present Illness: Brandon Marks is a 65 y.o. year old male found to have a 13 mm left proximal obstructing ureteral calculus initially presenting with sepsis-like symptoms including hypotension, A. fib with RVR, rigors, leukocytosis and elevated lactate.  He also has acute renal insufficiency and a mildly elevated troponin, likely demand ischemia.  Initially, he is felt to be in cardiogenic shock and was placed on some low-dose pressors.  Upon further evaluation by the nurse practitioner in the ICU, he did appear to have some abdominal pain underwent CT scan indicating likely the source of his infection which is obstructing left ureteral calculus.  Patient is a limited historian currently.  He responds to sternal rub and awakens briefly and then falls back asleep.  He is unable to provide consent today.  He has no next of kin available.  Per the nurse practitioner in the ER, he does have a personal history of hydrocephalus and waning mental status at baseline per his neurologist..  He is currently in the ICU on low-dose pressors, 10 mics per minute of Levophed with blood pressures falling into the normal rang, maps in the 70s.  He has no urinalysis as of yet, has not been able to void.  Far he has received Flagyl, cefepime and vancomycin.  Past Medical History:  Diagnosis Date   Arthritis    Herniated disc    High cholesterol    Hypertension    Morbid obesity ([place])    PAC (premature atrial contraction)    Sleep apnea    non compliant with c-pap    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 at [place]  02/20/2009   normal coronary arteries   EYE SURGERY     bilateral cataract with lens implant   INCISION AND DRAINAGE  PERIRECTAL ABSCESS N/A 01/02/2018   Procedure: IRRIGATION AND DEBRIDEMENT PERIRECTAL ABSCESS;  Surgeon: Coralie Keens, MD;  Location: [place];  Service: General;  Laterality: N/A;   IRRIGATION AND DEBRIDEMENT BUTTOCKS N/A 01/05/2018   Procedure: DEBRIDEMENT BUTTOCKS ABSCESS;  Surgeon: Georganna Skeans, MD;  Location: [place];  Service: General;  Laterality: N/A;   KNEE ARTHROSCOPY  2010   Lt   NM MYOCAR PERF WALL MOTION  09/28/2007   small area of reversibility in the anterolateral wall at the apex concerning for ischemia   SHOULDER ARTHROSCOPY  4/12   Rt   TOTAL KNEE ARTHROPLASTY Left 08/11/2019   Procedure: TOTAL KNEE ARTHROPLASTY;  Surgeon: Paralee Cancel, MD;  [facility];  Service: Orthopedics;  Laterality: Left;  70 mins   TOTAL KNEE ARTHROPLASTY Right 09/13/2019   Procedure: TOTAL KNEE ARTHROPLASTY;  Surgeon: Paralee Cancel, MD;  [facility];  Service: Orthopedics;  Laterality: Right;  70 mins   WOUND DEBRIDEMENT N/A 01/04/2018   Procedure: IRRIGATION AND DEBRIDEMENT OF BUTTOCKS AND REMOVAL OF TICK FROM RIGHT TESTICLE;  Surgeon: Georganna Skeans, MD;  Location: [place];  Service: General;  Laterality: N/A;    Home Medications:  Current Meds  Medication Sig   apixaban (ELIQUIS) 5 MG TABS tablet Take 1 tablet (5 mg total) by mouth 2 (two) times daily.    Allergies:  Allergies  Allergen Reactions   Codeine Nausea And Vomiting   Oxycodone  Other (See Comments)    Upset GI    Family History  Problem Relation Age of Onset   Hypertension Mother    Heart failure Mother    Alzheimer's disease Mother    Skin cancer Mother    Heart disease Father    Alzheimer's disease Maternal Grandmother    Alzheimer's disease Maternal Grandfather     Social History:  reports that he has never smoked. He has never used smokeless tobacco. He reports that he does not drink alcohol and does not use drugs.  ROS: Unable to obtain review of systems due to mental status  Physical Exam:  Vital signs  in last 24 hours: Temp:  [97.6 F (36.4 C)-98.6 F (37 C)] 97.6 F (36.4 C) (11/09 2030) Pulse Rate:  [36-166] 109 (11/09 2030) Resp:  [0-36] 20 (11/09 2030) BP: (71-140)/(53-129) 140/129 (11/09 2030) SpO2:  [95 %-100 %] 99 % (11/09 2030) Weight:  [108.8 kg-109.5 kg] 109.5 kg (11/09 2030) Constitutional:  Alert and oriented, No acute distress HEENT: [place] AT, moist mucus membranes.  Trachea midline, no masses Cardiovascular: Regular rate and rhythm, no clubbing, cyanosis, or edema. Respiratory: Normal respiratory effort, lungs clear bilaterally Neurologic: Grossly intact, no focal deficits, moving all 4 extremities Psychiatric: Normal mood and affect   Laboratory Data:  Recent Labs    06/05/21 1350 06/05/21 1811  WBC 13.9* 28.2*  HGB 14.3 12.4*  HCT 41.0 35.9*   Recent Labs    06/05/21 1350 06/05/21 1811  NA 136  --   K 3.7  --   CL 105  --   CO2 17*  --   GLUCOSE 90  --   BUN 39*  --   CREATININE 2.47* 2.41*  CALCIUM 8.4*  --     Radiologic Imaging: CT scan was personally reviewed, agree with radiologic interpretation  Impression/plan:  1.  Obstructing left proximal ureteral calculus-likely source of current septic shock.  Recommend emergent left ureteral stent placement for the purpose of source control.  Is already received broad-spectrum antibiotics.  We will send additional blood and urine cultures for sepsis work-up.  Continue supportive care as needed.  Eventually, he will need definitive management of his stone once his infection has cleared  2.  Acute kidney injury-likely multifactorial including obstruction and prerenal.  3.  Sepsis of urinary source-we will obtain urine culture in the operating room  4. AMS-multifactorial.  Per associated craniotomy and hydrocephalus and overlying sepsis.  Unable to provide consent and thus will perform procedure emergently as a lifesaving measure.  Case was discussed with ICU team, Dr. Gerlene Fee of anesthesiology with  plans to perform procedure under some light sedation.   06/05/2021, 9:57 PM  Hollice Espy,  MD      &

## 2021-06-05 NOTE — ED Notes (Signed)
Notified EDP of pt c/o new onset left shoulder pain. Denies radiation down arm or up to neck or jaw. No numbness or tingling reported.

## 2021-06-05 NOTE — ED Notes (Addendum)
Pt reporting "he feels like he has to defecate."

## 2021-06-05 NOTE — ED Triage Notes (Signed)
ARrives via [facility]-- called to home for c/o back pain and constipation. Has history of Brain Tumor and will have a shunt placed.   Per EMS arrival, initial HR 50-60 with SBP:  110-120's.  Enroute, HR jumped up to 170's and SB:  dropped to 80's.  Patient arrives AAOx3. Skin warm and dry.  States "I Just need an enema".

## 2021-06-05 NOTE — ED Notes (Signed)
This pt just had episode of CP, central chest, stated it felt like sharp pain and burning, is now feeling slightly restless. Episode was after stopping Dilt gtt before beginning amio. HR dropped to 70's for approximately 2 minutes during episode, monitor showed PVC's as well, EKG obtained during episode and printed, given to MD [facility]. Episode of pain has subsided at this time, and HR is Afib back in 100's-115's. MD Kasa notfied via message at this time. ED MD Starleen Blue notified at this time.

## 2021-06-05 NOTE — ED Notes (Signed)
IV team at bedside 

## 2021-06-05 NOTE — Progress Notes (Signed)
eLink Physician-Brief Progress Note Patient Name: Brandon Marks DOB: 18-Nov-1955 MRN: [id number]   Date of Service  06/05/2021  HPI/Events of Note  Patient admitted through the emergency department with hypotension, lactic acidosis, and atrial fibrillation with RVR, in the context of a history of what appears to be shaking chills and mild confusion at home, CT abdomen shows obstructive uropathy of the left renal system secondary to a stone, raising the possibility of evolving septic shock. Troponin and BNP also elevated, but patient also has AKI.  eICU Interventions  New Patient Evaluation completed.        [name] Mavery Milling 06/05/2021, 9:31 PM

## 2021-06-05 NOTE — ED Notes (Signed)
Bladder scan x 3 with average of 30mls

## 2021-06-06 ENCOUNTER — Encounter: Payer: Self-pay | Admitting: Urology

## 2021-06-06 ENCOUNTER — Other Ambulatory Visit: Payer: Self-pay | Admitting: Physician Assistant

## 2021-06-06 DIAGNOSIS — I4891 Unspecified atrial fibrillation: Secondary | ICD-10-CM | POA: Diagnosis not present

## 2021-06-06 DIAGNOSIS — A419 Sepsis, unspecified organism: Secondary | ICD-10-CM | POA: Diagnosis present

## 2021-06-06 DIAGNOSIS — N390 Urinary tract infection, site not specified: Secondary | ICD-10-CM

## 2021-06-06 DIAGNOSIS — R6521 Severe sepsis with septic shock: Secondary | ICD-10-CM

## 2021-06-06 DIAGNOSIS — N201 Calculus of ureter: Secondary | ICD-10-CM | POA: Diagnosis present

## 2021-06-06 DIAGNOSIS — R7881 Bacteremia: Secondary | ICD-10-CM | POA: Diagnosis not present

## 2021-06-06 DIAGNOSIS — B961 Klebsiella pneumoniae [K. pneumoniae] as the cause of diseases classified elsewhere: Secondary | ICD-10-CM

## 2021-06-06 DIAGNOSIS — R570 Cardiogenic shock: Secondary | ICD-10-CM

## 2021-06-06 LAB — BLOOD CULTURE ID PANEL (REFLEXED) - BCID2

## 2021-06-06 LAB — CBC
HCT: 43.9 % (ref 39.0–52.0)
Hemoglobin: 14.7 g/dL (ref 13.0–17.0)
MCH: 31.4 pg (ref 26.0–34.0)
MCHC: 33.5 g/dL (ref 30.0–36.0)
MCV: 93.8 fL (ref 80.0–100.0)
Platelets: 85 10*3/uL — ABNORMAL LOW (ref 150–400)
RBC: 4.68 MIL/uL (ref 4.22–5.81)
RDW: 15 % (ref 11.5–15.5)
WBC: 26.2 10*3/uL — ABNORMAL HIGH (ref 4.0–10.5)
nRBC: 0 % (ref 0.0–0.2)

## 2021-06-06 LAB — BASIC METABOLIC PANEL
Anion gap: 11 (ref 5–15)
Anion gap: 9 (ref 5–15)
BUN: 46 mg/dL — ABNORMAL HIGH (ref 8–23)
BUN: 56 mg/dL — ABNORMAL HIGH (ref 8–23)
CO2: 17 mmol/L — ABNORMAL LOW (ref 22–32)
CO2: 18 mmol/L — ABNORMAL LOW (ref 22–32)
Calcium: 7.9 mg/dL — ABNORMAL LOW (ref 8.9–10.3)
Calcium: 7.9 mg/dL — ABNORMAL LOW (ref 8.9–10.3)
Chloride: 107 mmol/L (ref 98–111)
Chloride: 108 mmol/L (ref 98–111)
Creatinine, Ser: 2.73 mg/dL — ABNORMAL HIGH (ref 0.61–1.24)
Creatinine, Ser: 2.74 mg/dL — ABNORMAL HIGH (ref 0.61–1.24)
GFR, Estimated: 25 mL/min — ABNORMAL LOW (ref >60)
GFR, Estimated: 25 mL/min — ABNORMAL LOW (ref >60)
Glucose, Bld: 101 mg/dL — ABNORMAL HIGH (ref 70–99)
Glucose, Bld: 123 mg/dL — ABNORMAL HIGH (ref 70–99)
Potassium: 4 mmol/L (ref 3.5–5.1)
Potassium: 4.1 mmol/L (ref 3.5–5.1)
Sodium: 135 mmol/L (ref 135–145)
Sodium: 135 mmol/L (ref 135–145)

## 2021-06-06 LAB — PROCALCITONIN: Procalcitonin: 53.92 ng/mL

## 2021-06-06 LAB — HIV ANTIBODY (ROUTINE TESTING W REFLEX): HIV Screen 4th Generation wRfx: NONREACTIVE

## 2021-06-06 LAB — LACTIC ACID, PLASMA: Lactic Acid, Venous: 4.2 mmol/L (ref 0.5–1.9)

## 2021-06-06 MED ORDER — APIXABAN 5 MG PO TABS
5.0000 mg | ORAL_TABLET | Freq: Two times a day (BID) | ORAL | Status: DC
  Administered 2021-06-06 – 2021-06-07 (×4): 5 mg via ORAL
  Filled 2021-06-06 (×4): qty 1

## 2021-06-06 MED ORDER — SODIUM BICARBONATE 650 MG PO TABS
650.0000 mg | ORAL_TABLET | Freq: Four times a day (QID) | ORAL | Status: AC
  Administered 2021-06-06 – 2021-06-07 (×8): 650 mg via ORAL
  Filled 2021-06-06 (×8): qty 1

## 2021-06-06 MED ORDER — SODIUM CHLORIDE 0.9 % IV SOLN
2.0000 g | INTRAVENOUS | Status: DC
  Administered 2021-06-06 – 2021-06-08 (×3): 2 g via INTRAVENOUS
  Filled 2021-06-06: qty 20
  Filled 2021-06-06: qty 2
  Filled 2021-06-06: qty 20

## 2021-06-06 NOTE — Anesthesia Postprocedure Evaluation (Signed)
Anesthesia Post Note  Patient: Brandon Marks  Procedure(s) Performed: CYSTOSCOPY WITH STENT PLACEMENT (Left: Ureter)  Anesthesia Type: General Anesthetic complications: no   No notable events documented.   Last Vitals:  Vitals:   06/06/21 0600 06/06/21 0615  BP: 94/71 (!) 85/70  Pulse:    Resp: 19 19  Temp:    SpO2:      Last Pain:  Vitals:   06/06/21 0445  TempSrc: Oral  PainSc: 0-No pain                 Alison Stalling

## 2021-06-06 NOTE — Progress Notes (Signed)
[facility] Progress Note  Patient: Brandon Marks    ZPH:[id number]  DOA: 06/05/2021     Date of Service: the patient was seen and examined on 06/06/2021  Chief Complaint  Patient presents with   Weakness   Brief hospital course: 65 y.o  male with significant PMH of HTN, dCHF, OSA (on CPAP), HLD, atrial fibrillation, Roux-en-Y in 2019, vestibular schwannoma (s/p translab approach for resection on 80/16/5537 complicated by recurrent CSF leak and seizures return to OR for CSF leak repair on 01/06/2020) and high-volume LP on 01/15/2021 at [place] who presented to the ED with chief complaints of rigors and chills.   Patient is a poor historian history mostly obtained from patient's chart and EMS notes.  Per EMS, on arrival patient was complaining of back pain and constipation.  His initial vitals showed heart rate of 50 to 60 bpm with systolic blood pressure between 110s to 120s.  En route to the ED, patient went into A. fib with RVR with heart rate in the 170s.  Denies shortness of breath, chest pain, diarrhea, nausea or vomiting.   ED Course: On arrival to the ED, he was afebrile with blood pressure (!) 74/53 mm Hg and pulse rate(!) 162 beats/min, RR (!) 24,  oxygen saturation 98% on 2 L.  There were no focal neurological deficits; he was alert and oriented x3.  Patient received 1 L bolus of normal saline and 10 mg IV Dilts boluses x 2 with slight improvement in heart rate.  Pertinent Labs in Red/Diagnostics Findings: Na+/ K+: 136/3.7 Glucose: 90 BUN/Cr: 39/2.47 Calcium: 8.4 AST/ALT:36/16 Bilirubin: 1.6   WBC/ TMAX: 13.9>28.2/ afebrile Hgb/Hct: 14.3>12.4/41/35.9 Plts: 106>86 PCT: pending Lactic acid: 4.6>3.2 COVID PCR: Negative   Troponin: 109>102 BNP: 738 Venous Blood Gas result:  pO2 62; pCO2 36; pH 7.33;  HCO3 19, %O2 Sat 89.5.   EKG: show atrial fibrillation with rapid ventricular response at 174 bpm with a narrow QRS, normal axis, prolonged QTC otherwise normal intervals,  nonspecific ST changes. Imaging : CXR: No active cardiopulmonary process.  CT abd/pelvis>Acute hydronephrosis on the left. 13 mm stone in the proximal left ureter causing the obstruction.   Patient remained hypotensive despite IV fluid resuscitation therefore was started on Levophed and phenylephrine.  PCCM consulted for admission and further management. Discussed CT findings with on call Urologist Dr. Erlene Quan.   Assessment and Plan:  Sepsis with Septic Shock Secondary to Obstructive Uropathy Lactic: 4.6, Baseline PCT: 64.57, UA: pending, CXR: No active Disease, CT: Obstructive stone  S/p 2 L of NS & Cefepime/ Vancomycin/ Metronidazole, tress dose steroids hydrocortisone 100 x 1 dose. S/p Levophed in the ICU, weaned off, currently vital signs are stable but is still blood pressure is soft. Lactic acid 2.8--still elevated Blood culture growing Enterobacterales and  Klebsiella pneumonia -Monitor WBC/ fever curve -IV antibiotics, continue ceftriaxone 2 g IV daily -IVF hydration as needed Patient was stabilized in the ICU and downgraded under hospitalist service on 11/10 ID consulted for antibiotics and duration of treatment    Obstructive Uropathy secondary to Left Proximal ureteral Calculus S/p Left ureteral stent placement -UA positive and Urine Cx pending -Foley in place for decompression -PRN pain management -IV Abx as above pending cultures -Urology following, appreciate input   Acute Kidney Injury, Postrenal in the setting of obstructive uropathy Lactic Acidosis Metabolic acidosis Baseline creatinine 0.96 -Trend Lactate -Monitor I&O's / urinary output -Follow BMP -Ensure adequate renal perfusion -Avoid nephrotoxic agents as able -Replace electrolytes as indicated Started bicarbonate  oral supplement  Acute Encephalopathy likely multifactorial in the setting of sepsis  and Underlying ventriculomegaly PMHx: vestibular schwannoma (s/p resection on 99/35/7017 complicated by  recurrent CSF leak and seizures, Normal Pressure Hydrocephalus pending VP shunt placement Per [facility]. -CT head with worsening hydrocephalus, consider [facility] consult. Patient follows with Dr. Deatra Canter Zomorodi at [place] [facility] -Provide supportive care -Neuro checks per protocol -Seizure precautions   Atrial Fibrillation with RVR Hx: HLD, Chronic Diastolic CHF (last known EF 62%) BNP slightly Elevated, Hypertension, OSA on CPAP 11/10 resumed Eliquis Continue amiodarone IV infusion for rate control Cardiology consulted for further recommendation Follow 2D echocardiogram   Body mass index is 30.99 kg/m.  Interventions:   Microdata 11/9: SARS-CoV-2 PCR> negative 11/9: Influenza PCR> negative 11/9: Blood culture x2> 11/9: Urine Culture> 11/9: MRSA PCR>>     Antibiotics Vancomycin 11/9> Cefepime 11/9> Metronidazole 11/9>   Consults Urology, Cardiology, ID  Procedures 11/9> Left Ureteral stent placement  Diet: Heart healthy and carb modified DVT Prophylaxis: Therapeutic Anticoagulation with request    Advance goals of care discussion: Full code  Family Communication: family was NOT present at bedside, at the time of interview.  The pt provided permission to discuss medical plan with the family. Opportunity was given to ask question and all questions were answered satisfactorily.   Disposition:  Pt is from Home, admitted with septic shock, still has bacteremia and vital signs not stable, which precludes a safe discharge. Discharge to home, when stable may require 2 to 3 days more to improve.  Subjective: Patient was admitted in the ICU due to septic shock, stabilized and downgraded under hospitalist service today.  Patient was seen with soft blood pressure, clinically seems to be stable, denies any active issues, no chest pain or palpitation, no shortness of breath, no abdominal pain.  Patient was complaining of some dysuria while urination but he  already has Foley catheter.  Physical Exam: General:  NAD,  oriented to time, place, and person.  Appear in no distress, affect appropriate Eyes: PERRLA ENT: Oral Mucosa Clear, moist  Neck: no JVD,  Cardiovascular: S1 and S2 Present, no Murmur,  Respiratory: good respiratory effort, Bilateral Air entry equal and Decreased, no Crackles, no wheezes Abdomen: Bowel Sound present, Soft and no tenderness,  Skin: no rashes Extremities: no Pedal edema, no calf tenderness Neurologic: without any new focal findings Gait not checked due to patient safety concerns  Vitals:   06/06/21 1200 06/06/21 1300 06/06/21 1400 06/06/21 1500  BP: (!) 79/66 95/75 103/90   Pulse:      Resp: (!) 25 20 (!) 23 20  Temp: 98.5 F (36.9 C)     TempSrc: Oral     SpO2: 97%     Weight:      Height:        Intake/Output Summary (Last 24 hours) at 06/06/2021 1546 Last data filed at 06/06/2021 1500 Gross per 24 hour  Intake 2443.6 ml  Output 775 ml  Net 1668.6 ml   Filed Weights   06/05/21 1347 06/05/21 2030 06/06/21 0355  Weight: 108.8 kg 109.5 kg 109.5 kg    Data Reviewed: I have personally reviewed and interpreted daily labs, tele strips, imagings as discussed above. I reviewed all nursing notes, pharmacy notes, vitals, pertinent old records I have discussed plan of care as described above with RN and patient/family.  CBC: Recent Labs  Lab 06/05/21 1350 06/05/21 1811 06/06/21 0030  WBC 13.9* 28.2* 26.2*  HGB 14.3 12.4* 14.7  HCT 41.0 35.9* 43.9  MCV 88.9 91.1 93.8  PLT 106* 86* 85*   Basic Metabolic Panel: Recent Labs  Lab 06/05/21 1350 06/05/21 1811 06/05/21 2148 06/06/21 0030 06/06/21 1228  NA 136  --  135 135 135  K 3.7  --  3.7 4.0 4.1  CL 105  --  108 107 108  CO2 17*  --  18* 17* 18*  GLUCOSE 90  --  109* 101* 123*  BUN 39*  --  45* 46* 56*  CREATININE 2.47* 2.41* 2.68* 2.73* 2.74*  CALCIUM 8.4*  --  7.8* 7.9* 7.9*  MG  --   --  2.1  --   --     Studies: CT ABDOMEN  PELVIS WO CONTRAST  Result Date: 06/05/2021 CLINICAL DATA:  Abdominal pain, acute, nonlocalized. Heart failure. Anticoagulated. EXAM: CT ABDOMEN AND PELVIS WITHOUT CONTRAST TECHNIQUE: Multidetector CT imaging of the abdomen and pelvis was performed following the standard protocol without IV contrast. COMPARISON:  01/01/2018 FINDINGS: Lower chest: Mild atelectasis or scarring in both lung bases. Mild cardiomegaly. Hepatobiliary: Liver parenchyma appears normal without contrast. The gallbladder is somewhat distended. No calcified gallstones are seen. Pancreas: Normal Spleen: Normal Adrenals/Urinary Tract: Adrenal glands are prominent, possibly hyperplastic. The right kidney is normal. No cyst, mass, stone or hydronephrosis. There is hydronephrosis of the left kidney with the ureter dilated to the level of the lower pole of the kidney where there is a 13 mm stone causing the obstruction. No stone distal to that. No stone in the bladder. Stomach/Bowel: Previous gastric bypass. No acute bowel pathology is seen. Vascular/Lymphatic: Aortic atherosclerosis. IVC is normal. No adenopathy. Reproductive: Normal Other: No free fluid or air. Musculoskeletal: Chronic spinal degenerative changes with solid bridging osteophytes suggesting diffuse idiopathic skeletal hyperostosis. IMPRESSION: Acute hydronephrosis on the left. 13 mm stone in the proximal left ureter causing the obstruction. Previous gastric bypass. Aortic Atherosclerosis (ICD10-I70.0). Electronically Signed   By: Vaillancourt Chimes M.D.   On: 06/05/2021 20:31   CT HEAD WO CONTRAST (5MM)  Result Date: 06/05/2021 CLINICAL DATA:  Mental status changes. Heart failure. History of acoustic neuroma. EXAM: CT HEAD WITHOUT CONTRAST TECHNIQUE: Contiguous axial images were obtained from the base of the skull through the vertex without intravenous contrast. COMPARISON:  MRI 12/17/2020.  CT 10/27/2019. 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 Fat packing along the surgical approach. No CT evidence of residual mass. MRI would be more sensitive. Cerebral hemispheres do not show any acute or focal finding otherwise. There is ventriculomegaly, new when compared to CT scan of April 2021 but similar to the MRI in May of this year. Vascular: No abnormal vascular finding. 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 No evidence of residual or recurrent mass. Hydrocephalus, similar to the study of 12/17/2020 and newly seen since April of 2021. This could be significant. Has this been considered? Electronically Signed   By: Sprinkle Chimes M.D.   On: 06/05/2021 20:36   DG OR UROLOGY CYSTO IMAGE ([facility] ONLY)  Result Date: 06/05/2021 There is no interpretation for this exam.  This order is for images obtained during a surgical procedure.  Please See "Surgeries" Tab for more information regarding the procedure.    Scheduled Meds:  apixaban  5 mg Oral BID   Chlorhexidine Gluconate Cloth  6 each Topical Q0600   sodium bicarbonate  650 mg Oral QID  sodium chloride flush  3 mL Intravenous Q12H   Continuous Infusions:  sodium chloride 250 mL (06/05/21 1704)   sodium chloride     amiodarone 30 mg/hr (06/06/21 1500)   cefTRIAXone (ROCEPHIN)  IV Stopped (06/06/21 5501)   norepinephrine (LEVOPHED) Adult infusion Stopped (06/05/21 2347)   phenylephrine (NEO-SYNEPHRINE) Adult infusion Stopped (06/06/21 0058)   PRN Meds: sodium chloride, acetaminophen, docusate sodium, ondansetron (ZOFRAN) IV, polyethylene glycol, sodium chloride flush  Time spent: 35 minutes  Author: Val Riles. MD [facility] 06/06/2021 3:46 PM  To reach On-call, see care teams to locate the attending and reach out to them via [email or web address]. If 7PM-7AM, please contact night-coverage If you still have difficulty reaching the attending provider, please  page the [facility] (Director on Call) for [facility] on amion for assistance.

## 2021-06-06 NOTE — Progress Notes (Signed)
Surgical Physician Order Form  Dr. Erlene Quan Scheduling expectation :  2-3 weeks  *Length of Case:   *Clearance needed: yes, cardiology  *Anticoagulation Instructions: May continue all anticoagulants  *Aspirin Instructions: N/A  *Post-op visit Date/Instructions:  1 month with RUS prior  *Diagnosis: Left Ureteral Stone  *Procedure: Ureteroscopy w/laser lithotripsy & stent placement/exchange (28118)  -Admit type: OUTpatient  -Anesthesia: General  -VTE Prophylaxis Standing Order SCD's       Other:   -Standing Lab Orders Per Anesthesia    Lab other: UA&Urine Culture  -Standing Test orders EKG/Chest x-ray per Anesthesia       Test other:   - Medications:     Ancef 2gm IV   Other Instructions:

## 2021-06-06 NOTE — Consult Note (Signed)
NAME: Brandon Marks  DOB: [birth year]  MRN: [id number]  Date/Time: 06/06/2021 2:04 PM  REQUESTING PROVIDER: Dr.kumar Subjective:  REASON FOR CONSULT: klebsiella bacteremia ? Brandon Marks is a 65 y.o. male with a history of chronic diastoolic CHF, HTN, Morbid obesity. 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 EMS transported to him to ED with HR in the 80s but it increased to 170 in the ED and he was in Afib Vitals in the ED  BP 74/53, HR 162, RR 24 and temp 98.6. He was given a saline bolus. In the ED labs were WBC 28.4, HB 12.4, plt 86 and cr 2.68. Blood culture sent.and started on triple antibiotics Troponin was high CT abdomen showed There is hydronephrosis of the left kidney with the ureter dilated to the level of the lower pole of the kidney where  is a 13 mm stone causing the obstruction. HE underwent cystoscopy by Dr.Brandon and as per her note the following was seen  elevated bladder neck with cobblestoning along trigone, bladder erythema with debris and very dark concentrated appearing urine.  Efflux of some debris upon stent placement.  Some difficulty with stent placement secondary to bladder neck /J hooking of the distal ureter but ultimately successful. Ptis admitted to ICU for septic shock I am asked to see patient for the bacteremia and complicated UTI Pt lives on his own   Past Medical History:  Diagnosis Date   Arthritis    CSF leak    a. 12/2019 following resection of vestibular schwannoma; b.  12/2020 status post lumbar puncture for ventriculomegaly; e.  01/2021: Status post lumbar drain ([facility]).  Pending VP shunt.   Herniated disc    High cholesterol    History of stress test    a. 09/2007 MV: EF 63%, small area of anterolateral and apical  reversibility; b.  12/2016 MV: EF 62%.  Fixed small, mild mid anteroseptal and apical defect without reversibility.  Most likely attenuation.  Low risk study.   Hypertension    Morbid obesity ([place])    PAC (premature atrial contraction)    Persistent atrial fibrillation ([place])    a. first noted on 12 lead ECG 10/2019.   Sleep apnea    non compliant with c-pap   Vestibular schwannoma ([place])    a. 12/27/2019 s/p resection ([facility]). Post-op course complicated by CSF leak/seizures req repair 01/06/2020 and high volume LP on 01/16/2020.    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 at [place]  02/20/2009   normal coronary arteries   CYSTOSCOPY WITH STENT PLACEMENT Left 06/05/2021   Procedure: CYSTOSCOPY WITH STENT PLACEMENT;  Surgeon: Hollice Espy, MD;  Location: [facility];  Service: Urology;  Laterality: Left;   EYE SURGERY     bilateral cataract with lens implant   INCISION AND DRAINAGE PERIRECTAL ABSCESS N/A 01/02/2018   Procedure: IRRIGATION AND DEBRIDEMENT PERIRECTAL ABSCESS;  Surgeon: Coralie Keens, MD;  Location: [place];  Service: General;  Laterality: N/A;   IRRIGATION AND DEBRIDEMENT BUTTOCKS N/A 01/05/2018   Procedure: DEBRIDEMENT BUTTOCKS ABSCESS;  Surgeon: Georganna Skeans, MD;  Location: [place];  Service: General;  Laterality: N/A;   KNEE ARTHROSCOPY  2010   Lt   NM [facility]  PERF WALL MOTION  09/28/2007   small area of reversibility in the anterolateral wall at the apex concerning for ischemia   SHOULDER ARTHROSCOPY  4/12   Rt   TOTAL KNEE ARTHROPLASTY Left 08/11/2019   Procedure: TOTAL KNEE ARTHROPLASTY;  Surgeon: Paralee Cancel, MD;  Location: [facility];  Service: Orthopedics;  Laterality: Left;  70 mins   TOTAL KNEE ARTHROPLASTY Right 09/13/2019   Procedure: TOTAL KNEE ARTHROPLASTY;  Surgeon: Paralee Cancel, MD;  Location: [facility];  Service: Orthopedics;  Laterality: Right;  70 mins   WOUND DEBRIDEMENT N/A 01/04/2018   Procedure:  IRRIGATION AND DEBRIDEMENT OF BUTTOCKS AND REMOVAL OF TICK FROM RIGHT TESTICLE;  Surgeon: Georganna Skeans, MD;  Location: [place];  Service: General;  Laterality: N/A;    Social History   Socioeconomic History   Marital status: Divorced    Spouse name: Not on file   Number of children: 0   Years of education: some college   Highest education level: Not on file  Occupational History   Occupation: Retired  Tobacco Use   Smoking status: Never   Smokeless tobacco: Never  Vaping Use   Vaping Use: Never used  Substance and Sexual Activity   Alcohol use: No   Drug use: No   Sexual activity: Not on file  Other Topics Concern   Not on file  Social History Narrative   Denies caffeine use    Social Determinants of Health   Financial Resource Strain: Not on file  Food Insecurity: Not on file  Transportation Needs: Not on file  Physical Activity: Not on file  Stress: Not on file  Social Connections: Not on file  Intimate Partner Violence: Not on file    Family History  Problem Relation Age of Onset   Hypertension Mother    Heart failure Mother    Alzheimer's disease Mother    Skin cancer Mother    Heart disease Father    Alzheimer's disease Maternal Grandmother    Alzheimer's disease Maternal Grandfather    Allergies  Allergen Reactions   Codeine Nausea And Vomiting   Oxycodone Other (See Comments)    Upset GI   I? Current Facility-Administered Medications  Medication Dose Route Frequency Provider Last Rate Last Admin   0.9 %  sodium chloride infusion  250 mL Intravenous Continuous Noralee Space, RPH 10 mL/hr at 06/05/21 1704 250 mL at 06/05/21 1704   0.9 %  sodium chloride infusion  250 mL Intravenous PRN Flora Lipps, MD       acetaminophen (TYLENOL) tablet 650 mg  650 mg Oral Q4H PRN Flora Lipps, MD       amiodarone (NEXTERONE PREMIX) 360-4.14 MG/200ML-% (1.8 mg/mL) IV infusion  30 mg/hr Intravenous Continuous Rada Hay, MD 16.67 mL/hr at 06/06/21 1200 30  mg/hr at 06/06/21 1200   apixaban (ELIQUIS) tablet 5 mg  5 mg Oral BID Val Riles, MD       cefTRIAXone (ROCEPHIN) 2 g in sodium chloride 0.9 % 100 mL IVPB  2 g Intravenous Q24H Flora Lipps, MD   Stopped at 06/06/21 0865   Chlorhexidine Gluconate Cloth 2 % PADS 6 each  6 each Topical H8469 Flora Lipps, MD   6 each at 06/06/21 0607   docusate sodium (COLACE) capsule 100 mg  100 mg Oral BID PRN Flora Lipps, MD       norepinephrine (LEVOPHED) 4mg  in 275mL premix infusion  0-40 mcg/min Intravenous Continuous Rada Hay, MD   Stopped at 06/05/21 [phone]  ondansetron (ZOFRAN) injection 4 mg  4 mg Intravenous Q6H PRN Flora Lipps, MD       phenylephrine (NEO-SYNEPHRINE) 20mg /NS 253mL premix infusion  25-200 mcg/min Intravenous Titrated Noralee Space, RPH   Stopped at 06/06/21 0058   polyethylene glycol (MIRALAX / GLYCOLAX) packet 17 g  17 g Oral Daily PRN Flora Lipps, MD       sodium bicarbonate tablet 650 mg  650 mg Oral QID Val Riles, MD   650 mg at 06/06/21 1200   sodium chloride flush (NS) 0.9 % injection 3 mL  3 mL Intravenous Q12H Flora Lipps, MD   3 mL at 06/05/21 2325   sodium chloride flush (NS) 0.9 % injection 3 mL  3 mL Intravenous PRN Flora Lipps, MD         Abtx:  Anti-infectives (From admission, onward)    Start     Dose/Rate Route Frequency Ordered Stop   06/06/21 0700  cefTRIAXone (ROCEPHIN) 2 g in sodium chloride 0.9 % 100 mL IVPB        2 g 200 mL/hr over 30 Minutes Intravenous Every 24 hours 06/06/21 0515     06/05/21 1800  vancomycin (VANCOREADY) IVPB 2000 mg/400 mL        2,000 mg 200 mL/hr over 120 Minutes Intravenous  Once 06/05/21 1709 06/05/21 1933   06/05/21 1715  ceFEPIme (MAXIPIME) 2 g in sodium chloride 0.9 % 100 mL IVPB        2 g 200 mL/hr over 30 Minutes Intravenous  Once 06/05/21 1709 06/05/21 1808   06/05/21 1715  metroNIDAZOLE (FLAGYL) IVPB 500 mg        500 mg 100 mL/hr over 60 Minutes Intravenous  Once 06/05/21 1709 06/05/21 1823        REVIEW OF SYSTEMS:  Const: negative fever, negative chills,  weight loss of 170 pounds in 4 years following gastric bypass Eyes: negative diplopia or visual changes, negative eye pain ENT: negative coryza, negative sore throat Resp: negative cough, hemoptysis, dyspnea Cards: negative for chest pain, palpitations, lower extremity edema GU  incontinence GI: ++ abdominal pain, no diarrhea, bleeding, ++constipation Skin: negative for rash and pruritus Heme: negative for easy bruising and gum/nose bleeding MS: general l weakness Neurolo:dizziness Psych: negative for feelings of anxiety, depression  Endocrine: negative for thyroid, diabetes Allergy/Immunology- as above Objective:  VITALS:  BP (!) 79/66 (BP Location: Right Arm)   Pulse 92   Temp 98.5 F (36.9 C) (Oral)   Resp (!) 25   Ht 6\' 2"  (1.88 m)   Wt 109.5 kg   SpO2 97%   BMI 30.99 kg/m  PHYSICAL EXAM:  General: Alert, cooperative, no distress, appears stated age.  Head: Normocephalic, without obvious abnormality, atraumatic. Eyes: Conjunctivae clear, anicteric sclerae. Pupils are equal ENT Nares normal. No drainage or sinus tenderness. Rt facial palsy Lips, mucosa, and tongue normal. No Thrush Neck:  symmetrical, no adenopathy, thyroid: non tender no carotid bruit and no JVD. Back: No CVA tenderness. Lungs: Clear to auscultation bilaterally. No Wheezing or Rhonchi. No rales. Heart: irregular Abdomen: Soft, non-tender,not distended. Bowel sounds normal. No masses Extremities: atraumatic, no cyanosis. No edema. No clubbing Skin: No rashes or lesions. Or bruising Lymph: Cervical, supraclavicular normal. Neurologic: ? Rt facial asymmetry ? facial plasy Pertinent Labs Lab Results CBC    Component Value Date/Time   WBC 26.2 (H) 06/06/2021 0030   RBC 4.68 06/06/2021 0030   HGB 14.7 06/06/2021 0030   HGB 14.8 10/04/2020 1635  HCT 43.9 06/06/2021 0030   HCT 43.1 10/04/2020 1635   PLT 85 (L) 06/06/2021 0030   PLT  248 10/04/2020 1635   MCV 93.8 06/06/2021 0030   MCV 88 10/04/2020 1635   MCH 31.4 06/06/2021 0030   MCHC 33.5 06/06/2021 0030   RDW 15.0 06/06/2021 0030   RDW 14.1 10/04/2020 1635   LYMPHSABS 1.2 04/01/2021 1408   LYMPHSABS 1.2 10/04/2020 1635   MONOABS 0.9 04/01/2021 1408   EOSABS 0.0 04/01/2021 1408   EOSABS 0.0 10/04/2020 1635   BASOSABS 0.1 04/01/2021 1408   BASOSABS 0.0 10/04/2020 1635    CMP Latest Ref Rng & Units 06/06/2021 06/06/2021 06/05/2021  Glucose 70 - 99 mg/dL 123(H) 101(H) 109(H)  BUN 8 - 23 mg/dL 56(H) 46(H) 45(H)  Creatinine 0.61 - 1.24 mg/dL 2.74(H) 2.73(H) 2.68(H)  Sodium 135 - 145 mmol/L 135 135 135  Potassium 3.5 - 5.1 mmol/L 4.1 4.0 3.7  Chloride 98 - 111 mmol/L 108 107 108  CO2 22 - 32 mmol/L 18(L) 17(L) 18(L)  Calcium 8.9 - 10.3 mg/dL 7.9(L) 7.9(L) 7.8(L)  Total Protein 6.5 - 8.1 g/dL - - -  Total Bilirubin 0.3 - 1.2 mg/dL - - -  Alkaline Phos 38 - 126 U/L - - -  AST 15 - 41 U/L - - -  ALT 0 - 44 U/L - - -      Microbiology: Recent Results (from the past 240 hour(s))  Resp Panel by RT-PCR (Flu A&B, Covid) Nasopharyngeal Swab     Status: None   Collection Time: 06/05/21  2:44 PM   Specimen: Nasopharyngeal Swab; Nasopharyngeal(NP) swabs in vial transport medium  Result Value Ref Range Status   SARS Coronavirus 2 by RT PCR NEGATIVE NEGATIVE Final    Comment: (NOTE) SARS-CoV-2 target nucleic acids are NOT DETECTED.  The SARS-CoV-2 RNA is generally detectable in upper respiratory specimens during the acute phase of infection. The lowest concentration of SARS-CoV-2 viral copies this assay can detect is 138 copies/mL. A negative result does not preclude SARS-Cov-2 infection and should not be used as the sole basis for treatment or other patient management decisions. A negative result may occur with  improper specimen collection/handling, submission of specimen other than nasopharyngeal swab, presence of viral mutation(s) within the areas targeted  by this assay, and inadequate number of viral copies(<138 copies/mL). A negative result must be combined with clinical observations, patient history, and epidemiological information. The expected result is Negative.  Fact Sheet for Patients:  EntrepreneurPulse.com.au  Fact Sheet for Healthcare Providers:  IncredibleEmployment.be  This test is no t yet approved or cleared by the [place] FDA and  has been authorized for detection and/or diagnosis of SARS-CoV-2 by FDA under an Emergency Use Authorization (EUA). This EUA will remain  in effect (meaning this test can be used) for the duration of the COVID-19 declaration under Section 564(b)(1) of the Act, 21 U.S.C.section 360bbb-3(b)(1), unless the authorization is terminated  or revoked sooner.       Influenza A by PCR NEGATIVE NEGATIVE Final   Influenza B by PCR NEGATIVE NEGATIVE Final    Comment: (NOTE) The Xpert Xpress SARS-CoV-2/FLU/RSV plus assay is intended as an aid in the diagnosis of influenza from Nasopharyngeal swab specimens and should not be used as a sole basis for treatment. Nasal washings and aspirates are unacceptable for Xpert Xpress SARS-CoV-2/FLU/RSV testing.  Fact Sheet for Patients: EntrepreneurPulse.com.au  Fact Sheet for Healthcare Providers: IncredibleEmployment.be  This test is not yet approved or cleared by  the [employer] and has been authorized for detection and/or diagnosis of SARS-CoV-2 by FDA under an Emergency Use Authorization (EUA). This EUA will remain in effect (meaning this test can be used) for the duration of the COVID-19 declaration under Section 564(b)(1) of the Act, 21 U.S.C. section 360bbb-3(b)(1), unless the authorization is terminated or revoked.  Performed at [facility], [address] [place], [place] [address]   Blood culture (routine x 2)     Status: None (Preliminary result)    Collection Time: 06/05/21  3:49 PM   Specimen: BLOOD  Result Value Ref Range Status   Specimen Description BLOOD LEFT ANTECUBITAL  Final   Special Requests   Final    BOTTLES DRAWN AEROBIC AND ANAEROBIC Blood Culture adequate volume   Culture  Setup Time   Final    GRAM NEGATIVE RODS IN BOTH AEROBIC AND ANAEROBIC BOTTLES Organism ID to follow CRITICAL RESULT CALLED TO, READ BACK BY AND VERIFIED WITH: JASON [email or web address]  06/06/21 RH Performed at [facility], [address] [place], [place] [address]    Culture GRAM NEGATIVE RODS  Final   Report Status PENDING  Incomplete  Blood Culture ID Panel (Reflexed)     Status: Abnormal   Collection Time: 06/05/21  3:49 PM  Result Value Ref Range Status   Enterococcus faecalis NOT DETECTED NOT DETECTED Final   Enterococcus Faecium NOT DETECTED NOT DETECTED Final   Listeria monocytogenes NOT DETECTED NOT DETECTED Final   Staphylococcus species NOT DETECTED NOT DETECTED Final   Staphylococcus aureus (BCID) NOT DETECTED NOT DETECTED Final   Staphylococcus epidermidis NOT DETECTED NOT DETECTED Final   Staphylococcus lugdunensis NOT DETECTED NOT DETECTED Final   Streptococcus species NOT DETECTED NOT DETECTED Final   Streptococcus agalactiae NOT DETECTED NOT DETECTED Final   Streptococcus pneumoniae NOT DETECTED NOT DETECTED Final   Streptococcus pyogenes NOT DETECTED NOT DETECTED Final   A.calcoaceticus-baumannii NOT DETECTED NOT DETECTED Final   Bacteroides fragilis NOT DETECTED NOT DETECTED Final   Enterobacterales DETECTED (A) NOT DETECTED Final    Comment: Enterobacterales represent a large order of gram negative bacteria, not a single organism. CRITICAL RESULT CALLED TO, READ BACK BY AND VERIFIED WITH: JASON [email or web address]  06/06/21 RH    Enterobacter cloacae complex NOT DETECTED NOT DETECTED Final   Escherichia coli NOT DETECTED NOT DETECTED Final   Klebsiella aerogenes NOT DETECTED NOT DETECTED Final   Klebsiella oxytoca NOT  DETECTED NOT DETECTED Final   Klebsiella pneumoniae DETECTED (A) NOT DETECTED Final    Comment: CRITICAL RESULT CALLED TO, READ BACK BY AND VERIFIED WITH: JASON [email or web address]  06/06/21 RH    Proteus species NOT DETECTED NOT DETECTED Final   Salmonella species NOT DETECTED NOT DETECTED Final   Serratia marcescens NOT DETECTED NOT DETECTED Final   Haemophilus influenzae NOT DETECTED NOT DETECTED Final   Neisseria meningitidis NOT DETECTED NOT DETECTED Final   Pseudomonas aeruginosa NOT DETECTED NOT DETECTED Final   Stenotrophomonas maltophilia NOT DETECTED NOT DETECTED Final   Candida albicans NOT DETECTED NOT DETECTED Final   Candida auris NOT DETECTED NOT DETECTED Final   Candida glabrata NOT DETECTED NOT DETECTED Final   Candida krusei NOT DETECTED NOT DETECTED Final   Candida parapsilosis NOT DETECTED NOT DETECTED Final   Candida tropicalis NOT DETECTED NOT DETECTED Final   Cryptococcus neoformans/gattii NOT DETECTED NOT DETECTED Final   CTX-M ESBL NOT DETECTED NOT DETECTED Final   Carbapenem resistance IMP NOT DETECTED NOT DETECTED Final   Carbapenem resistance KPC NOT  DETECTED NOT DETECTED Final   Carbapenem resistance NDM NOT DETECTED NOT DETECTED Final   Carbapenem resist OXA 48 LIKE NOT DETECTED NOT DETECTED Final   Carbapenem resistance VIM NOT DETECTED NOT DETECTED Final    Comment: Performed at [facility], [address] [place], [place] [address]  Blood culture (routine x 2)     Status: None (Preliminary result)   Collection Time: 06/05/21  3:54 PM   Specimen: BLOOD  Result Value Ref Range Status   Specimen Description BLOOD RIGHT ANTECUBITAL  Final   Special Requests   Final    BOTTLES DRAWN AEROBIC AND ANAEROBIC Blood Culture adequate volume   Culture  Setup Time   Final    GRAM NEGATIVE RODS IN BOTH AEROBIC AND ANAEROBIC BOTTLES CRITICAL VALUE NOTED.  VALUE IS CONSISTENT WITH PREVIOUSLY REPORTED AND CALLED VALUE. Performed at [facility], [address] [place], [place] [address]    Culture GRAM NEGATIVE RODS  Final   Report Status PENDING  Incomplete  MRSA Next Gen by PCR, Nasal     Status: None   Collection Time: 06/05/21  8:32 PM   Specimen: Nasal Mucosa; Nasal Swab  Result Value Ref Range Status   MRSA by PCR Next Gen NOT DETECTED NOT DETECTED Final    Comment: (NOTE) The GeneXpert MRSA Assay (FDA approved for NASAL specimens only), is one component of a comprehensive MRSA colonization surveillance program. It is not intended to diagnose MRSA infection nor to guide or monitor treatment for MRSA infections. Test performance is not FDA approved in patients less than 16 years old. Performed at [facility], [address] [place], [place] [address]     IMAGING RESULTS:  I have personally reviewed the films ?ventriculomegaly  Impression/Recommendation ? 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 ___________________________________________________ Discussed with patient, requesting provider Note:  This document was prepared using Dragon voice recognition software and may include unintentional dictation errors.

## 2021-06-06 NOTE — Progress Notes (Signed)
PHARMACY - PHYSICIAN COMMUNICATION CRITICAL VALUE ALERT - BLOOD CULTURE IDENTIFICATION (BCID)  Brandon Marks is an 65 y.o. male who presented to [facility] on 06/05/2021 with a chief complaint of cardiogenic shock.   Assessment:  Kleb pneumo growing in 4 of 4 bottles, no resistance detected (include suspected source if known)  Name of physician (or Provider) Contacted:  Jonny Ruiz, NP   Current antibiotics: none   Changes to prescribed antibiotics recommended:  Ceftriaxone 2 gm IV Q24H to start 11/10 @ 0700.   Results for orders placed or performed during the hospital encounter of 06/05/21  Blood Culture ID Panel (Reflexed) (Collected: 06/05/2021  3:49 PM)  Result Value Ref Range   Enterococcus faecalis NOT DETECTED NOT DETECTED   Enterococcus Faecium NOT DETECTED NOT DETECTED   Listeria monocytogenes NOT DETECTED NOT DETECTED   Staphylococcus species NOT DETECTED NOT DETECTED   Staphylococcus aureus (BCID) NOT DETECTED NOT DETECTED   Staphylococcus epidermidis NOT DETECTED NOT DETECTED   Staphylococcus lugdunensis NOT DETECTED NOT DETECTED   Streptococcus species NOT DETECTED NOT DETECTED   Streptococcus agalactiae NOT DETECTED NOT DETECTED   Streptococcus pneumoniae NOT DETECTED NOT DETECTED   Streptococcus pyogenes NOT DETECTED NOT DETECTED   A.calcoaceticus-baumannii NOT DETECTED NOT DETECTED   Bacteroides fragilis NOT DETECTED NOT DETECTED   Enterobacterales DETECTED (A) NOT DETECTED   Enterobacter cloacae complex NOT DETECTED NOT DETECTED   Escherichia coli NOT DETECTED NOT DETECTED   Klebsiella aerogenes NOT DETECTED NOT DETECTED   Klebsiella oxytoca NOT DETECTED NOT DETECTED   Klebsiella pneumoniae DETECTED (A) NOT DETECTED   Proteus species NOT DETECTED NOT DETECTED   Salmonella species NOT DETECTED NOT DETECTED   Serratia marcescens NOT DETECTED NOT DETECTED   Haemophilus influenzae NOT DETECTED NOT DETECTED   Neisseria meningitidis NOT DETECTED NOT DETECTED    Pseudomonas aeruginosa NOT DETECTED NOT DETECTED   Stenotrophomonas maltophilia NOT DETECTED NOT DETECTED   Candida albicans NOT DETECTED NOT DETECTED   Candida auris NOT DETECTED NOT DETECTED   Candida glabrata NOT DETECTED NOT DETECTED   Candida krusei NOT DETECTED NOT DETECTED   Candida parapsilosis NOT DETECTED NOT DETECTED   Candida tropicalis NOT DETECTED NOT DETECTED   Cryptococcus neoformans/gattii NOT DETECTED NOT DETECTED   CTX-M ESBL NOT DETECTED NOT DETECTED   Carbapenem resistance IMP NOT DETECTED NOT DETECTED   Carbapenem resistance KPC NOT DETECTED NOT DETECTED   Carbapenem resistance NDM NOT DETECTED NOT DETECTED   Carbapenem resist OXA 48 LIKE NOT DETECTED NOT DETECTED   Carbapenem resistance VIM NOT DETECTED NOT DETECTED    Brandon Marks 06/06/2021  5:16 AM

## 2021-06-06 NOTE — Progress Notes (Signed)
Urology Inpatient Progress Note  Subjective: Off pressors, on room air.  He is afebrile but remains stably hypotensive. Blood cultures growing Klebsiella pneumoniae, on antibiotics as below. A.m. labs were drawn at [place].  Lactate at that time was up to 4.2, creatinine stable at 2.73, and WBC count slightly down at 26.2. This morning, patient is awake and conversational, asking appropriate questions.  He is eating breakfast and reports some occasional urinary urgency.  Foley catheter in place draining amber urine.  Anti-infectives: Anti-infectives (From admission, onward)    Start     Dose/Rate Route Frequency Ordered Stop   06/06/21 0700  cefTRIAXone (ROCEPHIN) 2 g in sodium chloride 0.9 % 100 mL IVPB        2 g 200 mL/hr over 30 Minutes Intravenous Every 24 hours 06/06/21 0515     06/05/21 1800  vancomycin (VANCOREADY) IVPB 2000 mg/400 mL        2,000 mg 200 mL/hr over 120 Minutes Intravenous  Once 06/05/21 1709 06/05/21 1933   06/05/21 1715  ceFEPIme (MAXIPIME) 2 g in sodium chloride 0.9 % 100 mL IVPB        2 g 200 mL/hr over 30 Minutes Intravenous  Once 06/05/21 1709 06/05/21 1808   06/05/21 1715  metroNIDAZOLE (FLAGYL) IVPB 500 mg        500 mg 100 mL/hr over 60 Minutes Intravenous  Once 06/05/21 1709 06/05/21 1823       Current Facility-Administered Medications  Medication Dose Route Frequency Provider Last Rate Last Admin   0.9 %  sodium chloride infusion  250 mL Intravenous Continuous Chinita Greenland A, RPH 10 mL/hr at 06/05/21 1704 250 mL at 06/05/21 1704   0.9 %  sodium chloride infusion  250 mL Intravenous PRN Flora Lipps, MD       acetaminophen (TYLENOL) tablet 650 mg  650 mg Oral Q4H PRN Flora Lipps, MD       amiodarone (NEXTERONE PREMIX) 360-4.14 MG/200ML-% (1.8 mg/mL) IV infusion  30 mg/hr Intravenous Continuous Rada Hay, MD 16.67 mL/hr at 06/06/21 0614 30 mg/hr at 06/06/21 0614   cefTRIAXone (ROCEPHIN) 2 g in sodium chloride 0.9 % 100 mL IVPB  2 g  Intravenous Q24H Flora Lipps, MD 200 mL/hr at 06/06/21 0612 2 g at 06/06/21 0612   Chlorhexidine Gluconate Cloth 2 % PADS 6 each  6 each Topical Q0600 Flora Lipps, MD   6 each at 06/06/21 0607   docusate sodium (COLACE) capsule 100 mg  100 mg Oral BID PRN Flora Lipps, MD       heparin injection 5,000 Units  5,000 Units Subcutaneous Q8H Flora Lipps, MD   5,000 Units at 06/06/21 0606   norepinephrine (LEVOPHED) 4mg  in 22mL premix infusion  0-40 mcg/min Intravenous Continuous Rada Hay, MD   Stopped at 06/05/21 2347   ondansetron (ZOFRAN) injection 4 mg  4 mg Intravenous Q6H PRN Flora Lipps, MD       phenylephrine (NEO-SYNEPHRINE) 20mg /NS 23mL premix infusion  25-200 mcg/min Intravenous Titrated Noralee Space, RPH   Stopped at 06/06/21 0058   polyethylene glycol (MIRALAX / GLYCOLAX) packet 17 g  17 g Oral Daily PRN Flora Lipps, MD       sodium chloride flush (NS) 0.9 % injection 3 mL  3 mL Intravenous Q12H Flora Lipps, MD   3 mL at 06/05/21 2325   sodium chloride flush (NS) 0.9 % injection 3 mL  3 mL Intravenous PRN Flora Lipps, MD       Objective:  Vital signs in last 24 hours: Temp:  [97.4 F (36.3 C)-98.6 F (37 C)] 98.1 F (36.7 C) (11/10 0445) Pulse Rate:  [36-166] 92 (11/09 2245) Resp:  [0-39] 19 (11/10 0615) BP: (71-140)/(53-129) 85/70 (11/10 0615) SpO2:  [95 %-100 %] 100 % (11/09 2254) Weight:  [108.8 kg-109.5 kg] 109.5 kg (11/10 0355)  Intake/Output from previous day: 11/09 0701 - 11/10 0700 In: 3182 [I.V.:592.4; IV Piggyback:2589.6] Out: 300 [Urine:300] Intake/Output this shift: No intake/output data recorded.  Physical Exam Vitals and nursing note reviewed.  Constitutional:      General: He is not in acute distress.    Appearance: He is not ill-appearing, toxic-appearing or diaphoretic.  HENT:     Head: Normocephalic and atraumatic.  Pulmonary:     Effort: Pulmonary effort is normal. No respiratory distress.  Skin:    General: Skin is warm and  dry.  Neurological:     Mental Status: He is alert and oriented to person, place, and time.  Psychiatric:        Mood and Affect: Mood normal.        Behavior: Behavior normal.   Lab Results:  Recent Labs    06/05/21 1811 06/06/21 0030  WBC 28.2* 26.2*  HGB 12.4* 14.7  HCT 35.9* 43.9  PLT 86* 85*   BMET Recent Labs    06/05/21 2148 06/06/21 0030  NA 135 135  K 3.7 4.0  CL 108 107  CO2 18* 17*  GLUCOSE 109* 101*  BUN 45* 46*  CREATININE 2.68* 2.73*  CALCIUM 7.8* 7.9*   ABG Recent Labs    06/05/21 2130  HCO3 19.0*   Studies/Results: CT ABDOMEN PELVIS WO CONTRAST  Result Date: 06/05/2021 CLINICAL DATA:  Abdominal pain, acute, nonlocalized. Heart failure. Anticoagulated. EXAM: CT ABDOMEN AND PELVIS WITHOUT CONTRAST TECHNIQUE: Multidetector CT imaging of the abdomen and pelvis was performed following the standard protocol without IV contrast. COMPARISON:  01/01/2018 FINDINGS: Lower chest: Mild atelectasis or scarring in both lung bases. Mild cardiomegaly. Hepatobiliary: Liver parenchyma appears normal without contrast. The gallbladder is somewhat distended. No calcified gallstones are seen. Pancreas: Normal Spleen: Normal Adrenals/Urinary Tract: Adrenal glands are prominent, possibly hyperplastic. The right kidney is normal. No cyst, mass, stone or hydronephrosis. There is hydronephrosis of the left kidney with the ureter dilated to the level of the lower pole of the kidney where there is a 13 mm stone causing the obstruction. No stone distal to that. No stone in the bladder. Stomach/Bowel: Previous gastric bypass. No acute bowel pathology is seen. Vascular/Lymphatic: Aortic atherosclerosis. IVC is normal. No adenopathy. Reproductive: Normal Other: No free fluid or air. Musculoskeletal: Chronic spinal degenerative changes with solid bridging osteophytes suggesting diffuse idiopathic skeletal hyperostosis. IMPRESSION: Acute hydronephrosis on the left. 13 mm stone in the proximal  left ureter causing the obstruction. Previous gastric bypass. Aortic Atherosclerosis (ICD10-I70.0). Electronically Signed   By: Deming Chimes M.D.   On: 06/05/2021 20:31   Assessment & Plan: 65 year old male admitted with septic shock likely associated with a 12 mm proximal left ureteral stone, now s/p left ureteral stent placement by Dr. Erlene Quan.  Patient has had significant clinical improvement overnight.  He is tolerating his stent well.  We discussed that he will require outpatient ureteroscopy with laser lithotripsy and stent exchange in 2 to 3 weeks after completion of 2 weeks of culture appropriate antibiotics.  Patient expressed understanding.  Recommendations: -Continue to trend BMP, CBC -Continue empiric antibiotics and follow cultures.  He will require a total of  14 days of culture appropriate therapy. -Okay to discontinue Foley catheter this afternoon/evening assuming continued clinical improvement. -If he begins to develop stent symptoms, okay to start Flomax and oxybutynin for symptom management. -Outpatient ureteroscopy with laser lithotripsy and stent exchange in 2 to 3 weeks.  Urology will sign off at this time.  Please contact us with additional concerns.  Debroah Loop, PA-C 06/06/2021

## 2021-06-06 NOTE — Consult Note (Signed)
Cardiology Consult    Patient ID: EYTAN CARRIGAN MRN: [id number], DOB/AGE: 03-21-1956   Admit date: 06/05/2021 Date of Consult: 06/06/2021  Primary Physician: Ladell Pier, MD Primary Cardiologist: Sanda Klein, MD - wishes to arrange f/u in [place] going forward Requesting Provider: Edd Fabian, MD  Patient Profile    Brandon Marks is a 65 y.o. male with a history of persistent atrial fibrillation, obesity status post gastric bypass, vestibular schwannoma status post resection complicated by CSF leak requiring lumbar punctures/drain, hyperlipidemia, hypertension, and prior low risk stress tests, who is being seen today for the evaluation of atrial fibrillation with rapid ventricular response and elevated troponin at the request of Dr. Dwyane Dee.  Past Medical History   Past Medical History:  Diagnosis Date   Arthritis    CSF leak    a. 12/2019 following resection of vestibular schwannoma; b.  12/2020 status post lumbar puncture for ventriculomegaly; e.  01/2021: Status post lumbar drain ([facility]).  Pending VP shunt.   Herniated disc    High cholesterol    History of stress test    a. 09/2007 MV: EF 63%, small area of anterolateral and apical reversibility; b.  12/2016 MV: EF 62%.  Fixed small, mild mid anteroseptal and apical defect without reversibility.  Most likely attenuation.  Low risk study.   Hypertension    Morbid obesity ([place])    PAC (premature atrial contraction)    Persistent atrial fibrillation (H[facility])    Sleep apnea    non compliant with c-pap   Vestibular schwannoma ([place])    a. 12/27/2019 s/p resection ([facility]). Post-op course complicated by CSF leak/seizures req repair 01/06/2020 and high volume LP on 01/16/2020.    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 at [place]  02/20/2009   normal coronary arteries   CYSTOSCOPY WITH STENT PLACEMENT Left 06/05/2021   Procedure: CYSTOSCOPY WITH STENT PLACEMENT;   Surgeon: Hollice Espy, MD;  Location: [facility];  Service: Urology;  Laterality: Left;   EYE SURGERY     bilateral cataract with lens implant   INCISION AND DRAINAGE PERIRECTAL ABSCESS N/A 01/02/2018   Procedure: IRRIGATION AND DEBRIDEMENT PERIRECTAL ABSCESS;  Surgeon: Coralie Keens, MD;  Location: [facility];  Service: General;  Laterality: N/A;   IRRIGATION AND DEBRIDEMENT BUTTOCKS N/A 01/05/2018   Procedure: DEBRIDEMENT BUTTOCKS ABSCESS;  Surgeon: Georganna Skeans, MD;  Location: [facility];  Service: General;  Laterality: N/A;   KNEE ARTHROSCOPY  2010   Lt   NM MYOCAR PERF WALL MOTION  09/28/2007   small area of reversibility in the anterolateral wall at the apex concerning for ischemia   SHOULDER ARTHROSCOPY  4/12   Rt   TOTAL KNEE ARTHROPLASTY Left 08/11/2019   Procedure: TOTAL KNEE ARTHROPLASTY;  Surgeon: Paralee Cancel, MD;  Location: [facility];  Service: Orthopedics;  Laterality: Left;  70 mins   TOTAL KNEE ARTHROPLASTY Right 09/13/2019   Procedure: TOTAL KNEE ARTHROPLASTY;  Surgeon: Paralee Cancel, MD;  Location: [facility];  Service: Orthopedics;  Laterality: Right;  70 mins   WOUND DEBRIDEMENT N/A 01/04/2018   Procedure: IRRIGATION AND DEBRIDEMENT OF BUTTOCKS AND REMOVAL OF TICK FROM RIGHT TESTICLE;  Surgeon: Georganna Skeans, MD;  Location: [facility];  Service: General;  Laterality: N/A;     Allergies  Allergies  Allergen Reactions   Codeine Nausea And Vomiting   Oxycodone Other (See Comments)    Upset GI    History of Present  Illness    Six 53-year-old male with the above complex past medical history including persistent atrial fibrillation, obesity status post gastric bypass, vestibular schwannoma status post resection complicated by CSF leak, hyperlipidemia, hypertension, and prior low risk stress tests.  Most recent stress test was performed in June 2018 as part of preoperative evaluation prior to gastric bypass.  This showed a small area of mild mid anteroseptal and apical defect without  reversibility.  EF was normal.  Overall, the study was felt to be low risk.  He subsequently underwent gastric bypass in February 2019.  June 2019, he was admitted with necrotizing fasciitis of the right buttock in the setting of a tick bite and required I&D with debridement.  He did experience sepsis and acute kidney injury during that admission.  In April 2021, he was seen in the [facility] emergency department with weakness.  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  Follow-up MRI showed large right cerebellopontine angle mass extending into the right internal auditory canal most consistent with a vestibular schwannoma.  There was mass-effect on the right ventral cerebellum.  Of note, ECG that day showed rate controlled atrial fibrillation, though this was labeled sinus rhythm PACs in ED notes.  Patient subsequently followed up with neurosurgery at [facility].  There is an EKG in care everywhere from May 2021 which indicates, his report indicates that the patient was in sinus rhythm with PACs.  He subsequently underwent resection of the vestibular schwannoma in June 2021.  Postoperative course was complicated by CSF leak and seizures requiring repair followed by high-volume lumbar puncture.    Patient has since been followed by neurosurgery at [facility].  In June of this year, he presented with intermittent dizziness.  MRI of the brain showed ventriculomegaly.  He was initially treated with a lumbar puncture upon his request but subsequently required admission for a lumbar drain in July of this year.  He presented for VP shunt on August 23 but was noted to be in atrial fibrillation and the procedure was delayed and he was referred to cardiology.  Eliquis was initiated.  Upon review of EKG reports in care everywhere, he was in A. fib dating back to at least January 13, 2021, with A. fib again seen on January 27, 2021.  Regardless, patient reported unsteady gait and falls.  There was  concern about ongoing oral anticoagulation and he was referred to electrophysiology for consideration of a watchman.  Looks like an echo was also ordered.  Patient has yet to follow-up with EP and there is no echo available in [place] for review.  Mr. Labarge was in his usual state of health until earlier this week, when he started having severe back and abdominal pain along with constipation.  Pain became so severe that on November 9, he called EMS where he was reportedly found to have heart rates in the 50s to 60s initially though rates rose into the 170s in route to the ED.  On arrival, he was in A. fib with RVR at 160 bpm.  He was hypotensive at 7453.  He was treated with normal saline and IV diltiazem.  His BUN and creatinine were elevated at 39 and 2.47 respectively, which was well above prior baseline.  Lactate elevated at 4.6.  Leukocytosis noted at 28.2.  He was afebrile.  Troponin was elevated at 109  102.  CT of the abdomen pelvis showed acute hydronephrosis on the  left with a 13 mm stone in the proximal left ureter causing obstruction.  He was seen by urology and underwent emergent left ureteral stent placement with plan for outpatient ureteroscopy and laser lithotripsy in 2 to 3 weeks.  Blood cultures have since grown Klebsiella pneumoniae and antibiotic therapy has been narrowed to ceftriaxone.  He remains in atrial fibrillation with rates in the 1 teens on intravenous amiodarone.  He continues to require norepinephrine in the setting of ongoing hypotension blood pressure 79/66 earlier today.  Patient denies any prior history of chest pain or dyspnea.  Inpatient Medications     apixaban  5 mg Oral BID   Chlorhexidine Gluconate Cloth  6 each Topical Q0600   sodium bicarbonate  650 mg Oral QID   sodium chloride flush  3 mL Intravenous Q12H    Family History    Family History  Problem Relation Age of Onset   Hypertension Mother    Heart failure Mother    Alzheimer's disease Mother     Skin cancer Mother    Heart disease Father    Alzheimer's disease Maternal Grandmother    Alzheimer's disease Maternal Grandfather    He indicated that his mother is alive. He indicated that his father is deceased. He indicated that his brother is alive. He indicated that his maternal grandmother is deceased. He indicated that his maternal grandfather is deceased. He indicated that his paternal grandmother is deceased. He indicated that his paternal grandfather is deceased.   Social History    Social History   Socioeconomic History   Marital status: Divorced    Spouse name: Not on file   Number of children: 0   Years of education: some college   Highest education level: Not on file  Occupational History   Occupation: Retired  Tobacco Use   Smoking status: Never   Smokeless tobacco: Never  Vaping Use   Vaping Use: Never used  Substance and Sexual Activity   Alcohol use: No   Drug use: No   Sexual activity: Not on file  Other Topics Concern   Not on file  Social History Narrative   Denies caffeine use    Social Determinants of Health   Financial Resource Strain: Not on file  Food Insecurity: Not on file  Transportation Needs: Not on file  Physical Activity: Not on file  Stress: Not on file  Social Connections: Not on file  Intimate Partner Violence: Not on file     Review of Systems    General:  +++ chills, +++ subjective fever, +++ diaphoresis.  No weight changes.  Cardiovascular:  No chest pain, dyspnea on exertion, edema, orthopnea, palpitations, paroxysmal nocturnal dyspnea. Dermatological: No rash, lesions/masses Respiratory: No cough, dyspnea Urologic: No hematuria, dysuria Abdominal:   +++ nausea, +++ abdominal and back pain.  No vomiting, diarrhea, bright red blood per rectum, melena, or hematemesis Neurologic:  +++ Altered mental status in the setting of severe pain and sepsis.  No visual changes, wkns. All other systems reviewed and are otherwise  negative except as noted above.  Physical Exam    Blood pressure (!) 79/66, pulse 92, temperature 98.5 F (36.9 C), temperature source Oral, resp. rate (!) 25, height 6\' 2"  (1.88 m), weight 109.5 kg, SpO2 97 %.  General: Pleasant, NAD Psych: Normal affect. Neuro: Alert and oriented X 3. Moves all extremities spontaneously. HEENT: Normal  Neck: Supple without bruits or JVD. Lungs:  Resp regular and unlabored, CTA. Heart: Irregularly irregular, tachycardic, no s3,  s4, or murmurs. Abdomen: Soft, non-tender, non-distended, BS + x 4.  Extremities: No clubbing, cyanosis or edema. DP/PT2+, Radials 2+ and equal bilaterally.  Labs    Cardiac Enzymes Recent Labs  Lab 06/05/21 1350 06/05/21 1552  TROPONINIHS 109* 102*      Lab Results  Component Value Date   WBC 26.2 (H) 06/06/2021   HGB 14.7 06/06/2021   HCT 43.9 06/06/2021   MCV 93.8 06/06/2021   PLT 85 (L) 06/06/2021    Recent Labs  Lab 06/05/21 1350 06/05/21 1811 06/06/21 1228  NA 136   < > 135  K 3.7   < > 4.1  CL 105   < > 108  CO2 17*   < > 18*  BUN 39*   < > 56*  CREATININE 2.47*   < > 2.74*  CALCIUM 8.4*   < > 7.9*  PROT 5.9*  --   --   BILITOT 1.6*  --   --   ALKPHOS 114  --   --   ALT 16  --   --   AST 36  --   --   GLUCOSE 90   < > 123*   < > = values in this interval not displayed.   Lab Results  Component Value Date   CHOL 99 (L) 12/27/2018   HDL 49 12/27/2018   LDLCALC 41 12/27/2018   TRIG 43 12/27/2018     Radiology Studies    CT ABDOMEN PELVIS WO CONTRAST  Result Date: 06/05/2021 CLINICAL DATA:  Abdominal pain, acute, nonlocalized. Heart failure. Anticoagulated. EXAM: CT ABDOMEN AND PELVIS WITHOUT CONTRAST TECHNIQUE: Multidetector CT imaging of the abdomen and pelvis was performed following the standard protocol without IV contrast. COMPARISON:  01/01/2018 FINDINGS: Lower chest: Mild atelectasis or scarring in both lung bases. Mild cardiomegaly. Hepatobiliary: Liver parenchyma appears normal  without contrast. The gallbladder is somewhat distended. No calcified gallstones are seen. Pancreas: Normal Spleen: Normal Adrenals/Urinary Tract: Adrenal glands are prominent, possibly hyperplastic. The right kidney is normal. No cyst, mass, stone or hydronephrosis. There is hydronephrosis of the left kidney with the ureter dilated to the level of the lower pole of the kidney where there is a 13 mm stone causing the obstruction. No stone distal to that. No stone in the bladder. Stomach/Bowel: Previous gastric bypass. No acute bowel pathology is seen. Vascular/Lymphatic: Aortic atherosclerosis. IVC is normal. No adenopathy. Reproductive: Normal Other: No free fluid or air. Musculoskeletal: Chronic spinal degenerative changes with solid bridging osteophytes suggesting diffuse idiopathic skeletal hyperostosis. IMPRESSION: Acute hydronephrosis on the left. 13 mm stone in the proximal left ureter causing the obstruction. Previous gastric bypass. Aortic Atherosclerosis (ICD10-I70.0). Electronically Signed   By: Kishbaugh Chimes M.D.   On: 06/05/2021 20:31   CT HEAD WO CONTRAST (5MM)  Result Date: 06/05/2021 CLINICAL DATA:  Mental status changes. Heart failure. History of acoustic neuroma. EXAM: CT HEAD WITHOUT CONTRAST TECHNIQUE: Contiguous axial images were obtained from the base of the skull through the vertex without intravenous contrast. COMPARISON:  MRI 12/17/2020.  CT 10/27/2019. 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 Fat packing along the surgical approach. No CT evidence of residual mass. MRI would be more sensitive. Cerebral hemispheres do not show any acute or focal finding otherwise. There is ventriculomegaly, new when compared to CT scan of April 2021 but similar to the MRI in May of this year. Vascular: No abnormal vascular finding. Skull: Otherwise negative Sinuses/Orbits: Clear/normal Other: None  IMPRESSION: Previous right-sided craniectomy for  resection of a large right CP angle mass. No evidence of residual or recurrent mass. Hydrocephalus, similar to the study of 12/17/2020 and newly seen since April of 2021. This could be significant. Has this been considered? Electronically Signed   By: Fix Chimes M.D.   On: 06/05/2021 20:36   DG Chest Portable 1 View  Result Date: 06/05/2021 CLINICAL DATA:  Weakness. EXAM: PORTABLE CHEST 1 VIEW COMPARISON:  January 08, 2018. FINDINGS: Stable cardiomegaly. Both lungs are clear. The visualized skeletal structures are unremarkable. IMPRESSION: No active disease. Electronically Signed   By: Marijo Conception M.D.   On: 06/05/2021 14:28   DG OR UROLOGY CYSTO IMAGE ([place])  Result Date: 06/05/2021 There is no interpretation for this exam.  This order is for images obtained during a surgical procedure.  Please See "Surgeries" Tab for more information regarding the procedure.    ECG & Cardiac Imaging    November 9 at 1349 atrial fibrillation, 174, no acute ST-T changes- personally reviewed. November 9 at 1853 atrial fibrillation 73, right axis deviation, no acute ST-T changes  Telemetry: Ongoing atrial fibrillation with rates predominantly low 100s to 1 teens  Assessment & Plan    1.  Sepsis/Klebsiella bacteremia/left ureteral stone: Patient presented with several day history of profound abdominal and back pain as well as chills and subjective fevers.  Found to have hypotension and tachycardia (A. fib with RVR), in addition to an obstructive left ureteral stone with hydronephrosis.  He is now status post left ureteral stenting with plan for outpatient laser lithotripsy.  Blood cultures have grown Klebsiella pneumoniae and antibiotics have been narrowed to ceftriaxone by primary team.  Remains hypotensive on norepinephrine therapy is being followed by critical care team as well.  2.  Atrial fibrillation with rapid ventricular response: Detailed review of prior ECGs shows that patient was in the emergency  department in April 2021 for complaints of weakness, and ECGs showed atrial fibrillation, though they were labeled sinus rhythm with PACs by the provider.  There was a subsequent ECG report in Care Everywhere in May 2021 that indicates the patient was in sinus rhythm with PACs at that particular time.  ECG reports in care everywhere through the [facility] system dating back to June of this year showed atrial fibrillation and he was seen by [facility] cardiology in September with referral made to electrophysiology for consideration of watchman given ongoing need for procedures related to hydrocephalus, along with unsteady gait and multiple falls.  Patient says his EP appointment was rescheduled but he does not know for when.  He was also ordered an echo but this has not been carried out yet either.  Upon arrival to the ED on November 9, patient was in A. fib with RVR.  Notes indicate that upon EMS arrival at his home, his heart rates were in the 50s to 60s, though no rhythm strips are available.  Regardless, at a minimum, patient has paroxysmal atrial fibrillation and likely high burden persistent A. fib given the number of times A. fib has been seen on ECG when hospitalized.  He has been anticoagulated with Eliquis and this is currently ordered.  He is currently being rate controlled with amiodarone in the setting of ongoing hypotension requiring norepinephrine.  He was on oral diltiazem at home previously.  Suspect that his rate will improve as his clinical condition and hypotension improves.  We will arrange for 2D echocardiogram today.  Provided that EF is  normal, pressures improved, and rates are controlled, can likely transition back to his home dose of diltiazem as we would not use amiodarone for rate control alone.  He would like to establish care locally and we will be happy to see him as an outpatient and can also have him evaluated by electrophysiology for consideration of watchman in the future.  3.  Elevated  troponin: Troponin 109 with subsequent value of 102 in the setting of A. fib with RVR, hypotension, acute kidney injury, obstructive uropathy, pain, and sepsis.  He denies chest pain or dyspnea prior to this presentation.  Suspect demand ischemia.  Await echo.  Provided that EF is normal, would likely pursue an outpatient stress test to rule out ischemia.  He previously had a low risk stress test in 2018.  No role for aspirin in the setting of Eliquis.  No beta-blocker in the setting of hypotension.  Follow-up lipids.  4.  Acute kidney injury/obstructive uropathy: Creatinine up to 2.74 this morning in the setting of above.  Creatinine 0.96 on October 10.  Status post left ureteral stenting.  Follow.  Signed, Murray Hodgkins, NP 06/06/2021, 2:00 PM  For questions or updates, please contact   Please consult [email or web address] for contact info under Cardiology/STEMI.

## 2021-06-06 NOTE — H&P (View-Only) (Signed)
Urology Inpatient Progress Note  Subjective: Off pressors, on room air.  He is afebrile but remains stably hypotensive. Blood cultures growing Klebsiella pneumoniae, on antibiotics as below. A.m. labs were drawn at [place].  Lactate at that time was up to 4.2, creatinine stable at 2.73, and WBC count slightly down at 26.2. This morning, patient is awake and conversational, asking appropriate questions.  He is eating breakfast and reports some occasional urinary urgency.  Foley catheter in place draining amber urine.  Anti-infectives: Anti-infectives (From admission, onward)    Start     Dose/Rate Route Frequency Ordered Stop   06/06/21 0700  cefTRIAXone (ROCEPHIN) 2 g in sodium chloride 0.9 % 100 mL IVPB        2 g 200 mL/hr over 30 Minutes Intravenous Every 24 hours 06/06/21 0515     06/05/21 1800  vancomycin (VANCOREADY) IVPB 2000 mg/400 mL        2,000 mg 200 mL/hr over 120 Minutes Intravenous  Once 06/05/21 1709 06/05/21 1933   06/05/21 1715  ceFEPIme (MAXIPIME) 2 g in sodium chloride 0.9 % 100 mL IVPB        2 g 200 mL/hr over 30 Minutes Intravenous  Once 06/05/21 1709 06/05/21 1808   06/05/21 1715  metroNIDAZOLE (FLAGYL) IVPB 500 mg        500 mg 100 mL/hr over 60 Minutes Intravenous  Once 06/05/21 1709 06/05/21 1823       Current Facility-Administered Medications  Medication Dose Route Frequency Provider Last Rate Last Admin   0.9 %  sodium chloride infusion  250 mL Intravenous Continuous Chinita Greenland A, RPH 10 mL/hr at 06/05/21 1704 250 mL at 06/05/21 1704   0.9 %  sodium chloride infusion  250 mL Intravenous PRN Flora Lipps, MD       acetaminophen (TYLENOL) tablet 650 mg  650 mg Oral Q4H PRN Flora Lipps, MD       amiodarone (NEXTERONE PREMIX) 360-4.14 MG/200ML-% (1.8 mg/mL) IV infusion  30 mg/hr Intravenous Continuous Rada Hay, MD 16.67 mL/hr at 06/06/21 0614 30 mg/hr at 06/06/21 0614   cefTRIAXone (ROCEPHIN) 2 g in sodium chloride 0.9 % 100 mL IVPB  2 g  Intravenous Q24H Flora Lipps, MD 200 mL/hr at 06/06/21 0612 2 g at 06/06/21 0612   Chlorhexidine Gluconate Cloth 2 % PADS 6 each  6 each Topical Q0600 Flora Lipps, MD   6 each at 06/06/21 0607   docusate sodium (COLACE) capsule 100 mg  100 mg Oral BID PRN Flora Lipps, MD       heparin injection 5,000 Units  5,000 Units Subcutaneous Q8H Flora Lipps, MD   5,000 Units at 06/06/21 0606   norepinephrine (LEVOPHED) 4mg  in 251mL premix infusion  0-40 mcg/min Intravenous Continuous Rada Hay, MD   Stopped at 06/05/21 2347   ondansetron (ZOFRAN) injection 4 mg  4 mg Intravenous Q6H PRN Flora Lipps, MD       phenylephrine (NEO-SYNEPHRINE) 20mg /NS 231mL premix infusion  25-200 mcg/min Intravenous Titrated Noralee Space, RPH   Stopped at 06/06/21 0058   polyethylene glycol (MIRALAX / GLYCOLAX) packet 17 g  17 g Oral Daily PRN Flora Lipps, MD       sodium chloride flush (NS) 0.9 % injection 3 mL  3 mL Intravenous Q12H Flora Lipps, MD   3 mL at 06/05/21 2325   sodium chloride flush (NS) 0.9 % injection 3 mL  3 mL Intravenous PRN Flora Lipps, MD       Objective:  Vital signs in last 24 hours: Temp:  [97.4 F (36.3 C)-98.6 F (37 C)] 98.1 F (36.7 C) (11/10 0445) Pulse Rate:  [36-166] 92 (11/09 2245) Resp:  [0-39] 19 (11/10 0615) BP: (71-140)/(53-129) 85/70 (11/10 0615) SpO2:  [95 %-100 %] 100 % (11/09 2254) Weight:  [108.8 kg-109.5 kg] 109.5 kg (11/10 0355)  Intake/Output from previous day: 11/09 0701 - 11/10 0700 In: 3182 [I.V.:592.4; IV Piggyback:2589.6] Out: 300 [Urine:300] Intake/Output this shift: No intake/output data recorded.  Physical Exam Vitals and nursing note reviewed.  Constitutional:      General: He is not in acute distress.    Appearance: He is not ill-appearing, toxic-appearing or diaphoretic.  HENT:     Head: Normocephalic and atraumatic.  Pulmonary:     Effort: Pulmonary effort is normal. No respiratory distress.  Skin:    General: Skin is warm and  dry.  Neurological:     Mental Status: He is alert and oriented to person, place, and time.  Psychiatric:        Mood and Affect: Mood normal.        Behavior: Behavior normal.   Lab Results:  Recent Labs    06/05/21 1811 06/06/21 0030  WBC 28.2* 26.2*  HGB 12.4* 14.7  HCT 35.9* 43.9  PLT 86* 85*   BMET Recent Labs    06/05/21 2148 06/06/21 0030  NA 135 135  K 3.7 4.0  CL 108 107  CO2 18* 17*  GLUCOSE 109* 101*  BUN 45* 46*  CREATININE 2.68* 2.73*  CALCIUM 7.8* 7.9*   ABG Recent Labs    06/05/21 2130  HCO3 19.0*   Studies/Results: CT ABDOMEN PELVIS WO CONTRAST  Result Date: 06/05/2021 CLINICAL DATA:  Abdominal pain, acute, nonlocalized. Heart failure. Anticoagulated. EXAM: CT ABDOMEN AND PELVIS WITHOUT CONTRAST TECHNIQUE: Multidetector CT imaging of the abdomen and pelvis was performed following the standard protocol without IV contrast. COMPARISON:  01/01/2018 FINDINGS: Lower chest: Mild atelectasis or scarring in both lung bases. Mild cardiomegaly. Hepatobiliary: Liver parenchyma appears normal without contrast. The gallbladder is somewhat distended. No calcified gallstones are seen. Pancreas: Normal Spleen: Normal Adrenals/Urinary Tract: Adrenal glands are prominent, possibly hyperplastic. The right kidney is normal. No cyst, mass, stone or hydronephrosis. There is hydronephrosis of the left kidney with the ureter dilated to the level of the lower pole of the kidney where there is a 13 mm stone causing the obstruction. No stone distal to that. No stone in the bladder. Stomach/Bowel: Previous gastric bypass. No acute bowel pathology is seen. Vascular/Lymphatic: Aortic atherosclerosis. IVC is normal. No adenopathy. Reproductive: Normal Other: No free fluid or air. Musculoskeletal: Chronic spinal degenerative changes with solid bridging osteophytes suggesting diffuse idiopathic skeletal hyperostosis. IMPRESSION: Acute hydronephrosis on the left. 13 mm stone in the proximal  left ureter causing the obstruction. Previous gastric bypass. Aortic Atherosclerosis (ICD10-I70.0). Electronically Signed   By: Cesaro Chimes M.D.   On: 06/05/2021 20:31   Assessment & Plan: 65 year old male admitted with septic shock likely associated with a 12 mm proximal left ureteral stone, now s/p left ureteral stent placement by Dr. Erlene Quan.  Patient has had significant clinical improvement overnight.  He is tolerating his stent well.  We discussed that he will require outpatient ureteroscopy with laser lithotripsy and stent exchange in 2 to 3 weeks after completion of 2 weeks of culture appropriate antibiotics.  Patient expressed understanding.  Recommendations: -Continue to trend BMP, CBC -Continue empiric antibiotics and follow cultures.  He will require a total of  14 days of culture appropriate therapy. -Okay to discontinue Foley catheter this afternoon/evening assuming continued clinical improvement. -If he begins to develop stent symptoms, okay to start Flomax and oxybutynin for symptom management. -Outpatient ureteroscopy with laser lithotripsy and stent exchange in 2 to 3 weeks.  Urology will sign off at this time.  Please contact us with additional concerns.  Debroah Loop, PA-C 06/06/2021

## 2021-06-07 ENCOUNTER — Other Ambulatory Visit: Payer: Self-pay | Admitting: Urology

## 2021-06-07 ENCOUNTER — Inpatient Hospital Stay (HOSPITAL_COMMUNITY)
Admit: 2021-06-07 | Discharge: 2021-06-07 | Disposition: A | Discharge status: Home / Self Care | Payer: Medicare Other | Attending: Nurse Practitioner | Admitting: Nurse Practitioner

## 2021-06-07 DIAGNOSIS — A419 Sepsis, unspecified organism: Secondary | ICD-10-CM | POA: Diagnosis not present

## 2021-06-07 DIAGNOSIS — B961 Klebsiella pneumoniae [K. pneumoniae] as the cause of diseases classified elsewhere: Secondary | ICD-10-CM | POA: Diagnosis not present

## 2021-06-07 DIAGNOSIS — R6521 Severe sepsis with septic shock: Secondary | ICD-10-CM | POA: Diagnosis not present

## 2021-06-07 DIAGNOSIS — R7881 Bacteremia: Secondary | ICD-10-CM | POA: Diagnosis not present

## 2021-06-07 DIAGNOSIS — N390 Urinary tract infection, site not specified: Secondary | ICD-10-CM | POA: Diagnosis not present

## 2021-06-07 DIAGNOSIS — N201 Calculus of ureter: Secondary | ICD-10-CM | POA: Diagnosis not present

## 2021-06-07 DIAGNOSIS — I4891 Unspecified atrial fibrillation: Secondary | ICD-10-CM | POA: Diagnosis not present

## 2021-06-07 LAB — BASIC METABOLIC PANEL
Anion gap: 7 (ref 5–15)
BUN: 66 mg/dL — ABNORMAL HIGH (ref 8–23)
CO2: 21 mmol/L — ABNORMAL LOW (ref 22–32)
Calcium: 7.9 mg/dL — ABNORMAL LOW (ref 8.9–10.3)
Chloride: 109 mmol/L (ref 98–111)
Creatinine, Ser: 2.42 mg/dL — ABNORMAL HIGH (ref 0.61–1.24)
GFR, Estimated: 29 mL/min — ABNORMAL LOW (ref >60)
Glucose, Bld: 90 mg/dL (ref 70–99)
Potassium: 3.8 mmol/L (ref 3.5–5.1)
Sodium: 137 mmol/L (ref 135–145)

## 2021-06-07 LAB — URINE CULTURE: Culture: 20000 — AB

## 2021-06-07 LAB — CBC
HCT: 37.9 % — ABNORMAL LOW (ref 39.0–52.0)
Hemoglobin: 13 g/dL (ref 13.0–17.0)
MCH: 29.8 pg (ref 26.0–34.0)
MCHC: 34.3 g/dL (ref 30.0–36.0)
MCV: 86.9 fL (ref 80.0–100.0)
Platelets: 77 10*3/uL — ABNORMAL LOW (ref 150–400)
RBC: 4.36 MIL/uL (ref 4.22–5.81)
RDW: 14.4 % (ref 11.5–15.5)
WBC: 24.6 10*3/uL — ABNORMAL HIGH (ref 4.0–10.5)
nRBC: 0 % (ref 0.0–0.2)

## 2021-06-07 LAB — ECHOCARDIOGRAM COMPLETE
AR max vel: 2.84 cm2
AV Area VTI: 3.95 cm2
AV Area mean vel: 2.92 cm2
AV Mean grad: 1 mmHg
AV Peak grad: 2.1 mmHg
Ao pk vel: 0.72 m/s
Area-P 1/2: 3.91 cm2
Height: 74 in
S' Lateral: 3.9 cm
Weight: 3862.46 oz

## 2021-06-07 LAB — MAGNESIUM: Magnesium: 2.3 mg/dL (ref 1.7–2.4)

## 2021-06-07 LAB — PROCALCITONIN: Procalcitonin: 39.43 ng/mL

## 2021-06-07 LAB — PHOSPHORUS: Phosphorus: 3.1 mg/dL (ref 2.5–4.6)

## 2021-06-07 NOTE — Progress Notes (Signed)
Progress Note  Patient Name: Brandon Marks Date of Encounter: 06/07/2021  Primary Cardiologist: Sanda Klein, MD  Subjective   Still having left sided back pain, esp when voiding, but overall feeling better.  No chest pain, sob, or palpitations.    Inpatient Medications    Scheduled Meds:  apixaban  5 mg Oral BID   Chlorhexidine Gluconate Cloth  6 each Topical Q0600   sodium bicarbonate  650 mg Oral QID   sodium chloride flush  3 mL Intravenous Q12H   Continuous Infusions:  sodium chloride 250 mL (06/05/21 1704)   sodium chloride     amiodarone 30 mg/hr (06/07/21 0900)   cefTRIAXone (ROCEPHIN)  IV Stopped (06/07/21 0708)   PRN Meds: sodium chloride, acetaminophen, docusate sodium, ondansetron (ZOFRAN) IV, polyethylene glycol, sodium chloride flush   Vital Signs    Vitals:   06/07/21 0600 06/07/21 0800 06/07/21 0840 06/07/21 0900  BP:  (!) 83/53 97/76 (!) 95/59  Pulse:      Resp:  (!) 0 16 17  Temp:   98 F (36.7 C)   TempSrc:   Oral   SpO2: 96% 97%    Weight:      Height:        Intake/Output Summary (Last 24 hours) at 06/07/2021 0911 Last data filed at 06/07/2021 0900 Gross per 24 hour  Intake 663.88 ml  Output 1000 ml  Net -336.12 ml   Filed Weights   06/05/21 1347 06/05/21 2030 06/06/21 0355  Weight: 108.8 kg 109.5 kg 109.5 kg    Physical Exam   GEN: Well nourished, well developed, in no acute distress.  HEENT: Grossly normal.  Neck: Supple, no JVD, carotid bruits, or masses. Cardiac: IR, IR, tachy, no murmurs, rubs, or gallops. No clubbing, cyanosis, edema.  Radials 2+, DP/PT 2+ and equal bilaterally.  Respiratory:  Respirations regular and unlabored, clear to auscultation bilaterally. GI: Soft, nontender, nondistended, BS + x 4. MS: no deformity or atrophy. Skin: warm and dry, no rash. Neuro:  Strength and sensation are intact. Psych: AAOx3.  Normal affect.  Labs    Chemistry Recent Labs  Lab 06/05/21 1350 06/05/21 1811  06/06/21 0030 06/06/21 1228 06/07/21 0414  NA 136   < > 135 135 137  K 3.7   < > 4.0 4.1 3.8  CL 105   < > 107 108 109  CO2 17*   < > 17* 18* 21*  GLUCOSE 90   < > 101* 123* 90  BUN 39*   < > 46* 56* 66*  CREATININE 2.47*   < > 2.73* 2.74* 2.42*  CALCIUM 8.4*   < > 7.9* 7.9* 7.9*  PROT 5.9*  --   --   --   --   ALBUMIN 3.1*  --   --   --   --   AST 36  --   --   --   --   ALT 16  --   --   --   --   ALKPHOS 114  --   --   --   --   BILITOT 1.6*  --   --   --   --   GFRNONAA 28*   < > 25* 25* 29*  ANIONGAP 14   < > 11 9 7    < > = values in this interval not displayed.     Hematology Recent Labs  Lab 06/05/21 1811 06/06/21 0030 06/07/21 0414  WBC 28.2* 26.2* 24.6*  RBC  3.94* 4.68 4.36  HGB 12.4* 14.7 13.0  HCT 35.9* 43.9 37.9*  MCV 91.1 93.8 86.9  MCH 31.5 31.4 29.8  MCHC 34.5 33.5 34.3  RDW 14.6 15.0 14.4  PLT 86* 85* 77*    Cardiac Enzymes  Recent Labs  Lab 06/05/21 1350 06/05/21 1552  TROPONINIHS 109* 102*      BNP Recent Labs  Lab 06/05/21 1350  BNP 738.0*    Lipids  Lab Results  Component Value Date   CHOL 99 (L) 12/27/2018   HDL 49 12/27/2018   LDLCALC 41 12/27/2018   TRIG 43 12/27/2018   CHOLHDL 2.0 12/27/2018    Radiology    CT ABDOMEN PELVIS WO CONTRAST  Result Date: 06/05/2021 CLINICAL DATA:  Abdominal pain, acute, nonlocalized. Heart failure. Anticoagulated. EXAM: CT ABDOMEN AND PELVIS WITHOUT CONTRAST TECHNIQUE: Multidetector CT imaging of the abdomen and pelvis was performed following the standard protocol without IV contrast. COMPARISON:  01/01/2018 FINDINGS: Lower chest: Mild atelectasis or scarring in both lung bases. Mild cardiomegaly. Hepatobiliary: Liver parenchyma appears normal without contrast. The gallbladder is somewhat distended. No calcified gallstones are seen. Pancreas: Normal Spleen: Normal Adrenals/Urinary Tract: Adrenal glands are prominent, possibly hyperplastic. The right kidney is normal. No cyst, mass, stone or  hydronephrosis. There is hydronephrosis of the left kidney with the ureter dilated to the level of the lower pole of the kidney where there is a 13 mm stone causing the obstruction. No stone distal to that. No stone in the bladder. Stomach/Bowel: Previous gastric bypass. No acute bowel pathology is seen. Vascular/Lymphatic: Aortic atherosclerosis. IVC is normal. No adenopathy. Reproductive: Normal Other: No free fluid or air. Musculoskeletal: Chronic spinal degenerative changes with solid bridging osteophytes suggesting diffuse idiopathic skeletal hyperostosis. IMPRESSION: Acute hydronephrosis on the left. 13 mm stone in the proximal left ureter causing the obstruction. Previous gastric bypass. Aortic Atherosclerosis (ICD10-I70.0). Electronically Signed   By: Tien Chimes M.D.   On: 06/05/2021 20:31   CT HEAD WO CONTRAST (5MM)  Result Date: 06/05/2021 CLINICAL DATA:  Mental status changes. Heart failure. History of acoustic neuroma. EXAM: CT HEAD WITHOUT CONTRAST TECHNIQUE: Contiguous axial images were obtained from the base of the skull through the vertex without intravenous contrast. COMPARISON:  MRI 12/17/2020.  CT 10/27/2019. 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 Fat packing along the surgical approach. No CT evidence of residual mass. MRI would be more sensitive. Cerebral hemispheres do not show any acute or focal finding otherwise. There is ventriculomegaly, new when compared to CT scan of April 2021 but similar to the MRI in May of this year. Vascular: No abnormal vascular finding. 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 No evidence of residual or recurrent mass. Hydrocephalus, similar to the study of 12/17/2020 and newly seen since April of 2021. This could be significant. Has this been considered? Electronically Signed   By: Grape Chimes M.D.   On: 06/05/2021 20:36   DG Chest Portable 1 View  Result Date: 06/05/2021 CLINICAL DATA:  Weakness. EXAM: PORTABLE CHEST 1 VIEW COMPARISON:  January 08, 2018. FINDINGS: Stable cardiomegaly. Both lungs are clear. The visualized skeletal structures are unremarkable. IMPRESSION: No active disease. Electronically Signed   By: Marijo Conception M.D.   On: 06/05/2021 14:28   DG OR UROLOGY CYSTO IMAGE ([place])  Result Date: 06/05/2021 There is no interpretation for this exam.  This order  is for images obtained during a surgical procedure.  Please See "Surgeries" Tab for more information regarding the procedure.    Telemetry    Afib, 1-teens - Personally Reviewed  Cardiac Studies   Lexiscan Cardiolite 6.2018   12/2016 MV: EF 62%.  Fixed small, mild mid anteroseptal and apical defect without reversibility.  Most likely attenuation.  Low risk study. _____________  2D Echocardiogram 11.11.2022  pending  Patient Profile     65 y.o. male with a history of persistent atrial fibrillation, obesity status post gastric bypass, vestibular schwannoma status post resection complicated by CSF leak requiring lumbar punctures/drain, hyperlipidemia, hypertension, and prior low risk stress tests, who was admitted 11/9 w/ sepsis, AKI in the setting of L hydronephrosis/obstructive uropathy/ureteral stone s/p emergent stenting, klebsiella bacteremia, elevated HsTrop, and afib w/ RVR (likely persistent).  Assessment & Plan    1.  Sepsis/Klebsiella bacteremia:  In setting of left ureteral stone w/ hydronephrosis, obstructive uropathy, and AKI.  S/p L ureteral stenting.  Abx per IM/ID.  2.  Afib w/ RVR:  Review of ECGs shows evidence of afib dating back to 10/2019, though [facility] ECG 11/2019 reports sinus rhythm.  Regardless, multiple ECGs w/ Afib dating back to June of this year (care everywhere).  He has been on eliquis since August.  Seen by [facility] cards in Sept w/ referral to EP due to unsteady gait, falls,  and high risk for bleeding, along w/ multiple recurrent procedures related to CSF leak - ? Benefit of watchman.  Hasn't seen EP yet.  Admitted 11/9 w/ chills, subj fevers, and severe abd pain  Sepsis/hypotension, L hydronephrosis, AKI, now s/p L ureteral stenting.  In that setting, he developed Afib w/ RVR (unclear what rhythm was upon EMS arrival but HR initially reported as being normal).  Given hypotension, IV amio has been used for rate control.  Rates in 1-teens for the most part.  He is asymptomatic.  Home med list show dilt, however, pt says that he wasn't taking it. Now off of pressors, however, BPs still soft. Cont IV amio for now.  Suspect rates will improve w/ pain mgmt and recovery from acute illness.  Cont eliquis.  Echo pending this AM.    3.  AKI/Obstructive uropathy:  s/p L ureteral stenting w/ plan for outpt lithotripsy.  Creat sl better this AM.    4.  Elevated HsTrop:  In setting of above.  Mild elevation w/ flat trend (109  102).  No chest pain.  Echo pending this AM.  Will consider outpt mv pending EF.  5.  Hydrocephalus:  pending VP shunt @ [facility].  Await echo re: preop cardiovascular risk.  Signed Murray Hodgkins, NP  06/07/2021, 9:11 AM    For questions or updates, please contact   Please consult [email or web address] for contact info under Cardiology/STEMI.

## 2021-06-07 NOTE — Progress Notes (Signed)
*  PRELIMINARY RESULTS* Echocardiogram 2D Echocardiogram has been performed.  Sherrie Sport 06/07/2021, 9:29 AM

## 2021-06-07 NOTE — Plan of Care (Addendum)
Neuro: alert and oriented, moved fairly well and independently to the chair this AM, in the PM he was significantly weaker, PT eval recommended Resp: stable on room air CV: afebrile, vital signs fairly stable, some hypotension, ECHO completed today, remains in Afib, HR 90s-120s GIGU: using urinal, often urinating in bed/not telling nurse-patient is very modest and clearly uncomfortable with the nursing care this demands; no BM, tolerating PO well Skin: moisture associated rash groin, scrotum, penis Social: patient communicating with family via personal cell phone  Problem: Education: Goal: Knowledge of General Education information will improve Description: Including pain rating scale, medication(s)/side effects and non-pharmacologic comfort measures Outcome: Progressing   Problem: Health Behavior/Discharge Planning: Goal: Ability to manage health-related needs will improve Outcome: Progressing   Problem: Clinical Measurements: Goal: Ability to maintain clinical measurements within normal limits will improve Outcome: Progressing Goal: Will remain free from infection Outcome: Progressing Goal: Diagnostic test results will improve Outcome: Progressing Goal: Respiratory complications will improve Outcome: Progressing Goal: Cardiovascular complication will be avoided Outcome: Progressing   Problem: Activity: Goal: Risk for activity intolerance will decrease Outcome: Progressing   Problem: Nutrition: Goal: Adequate nutrition will be maintained Outcome: Progressing   Problem: Coping: Goal: Level of anxiety will decrease Outcome: Progressing   Problem: Elimination: Goal: Will not experience complications related to bowel motility Outcome: Progressing Goal: Will not experience complications related to urinary retention Outcome: Progressing   Problem: Pain Managment: Goal: General experience of comfort will improve Outcome: Progressing   Problem: Safety: Goal: Ability to  remain free from injury will improve Outcome: Progressing   Problem: Skin Integrity: Goal: Risk for impaired skin integrity will decrease Outcome: Progressing

## 2021-06-07 NOTE — Progress Notes (Signed)
Date of Admission:  06/05/2021    ID: Brandon Marks is a 65 y.o. male Principal Problem:   Severe sepsis with septic shock [facility]) Active Problems:   Cardiogenic shock ([place])   Left ureteral calculus    Subjective: Pt doing better Says he had pain left side of abdomen when he was passing urine No fever  Medications:   apixaban  5 mg Oral BID   Chlorhexidine Gluconate Cloth  6 each Topical Q0600   sodium bicarbonate  650 mg Oral QID   sodium chloride flush  3 mL Intravenous Q12H    Objective: Vital signs in last 24 hours: Temp:  [98 F (36.7 C)-99.4 F (37.4 C)] 98 F (36.7 C) (11/11 0840) Resp:  [0-25] 24 (11/11 1000) BP: (79-103)/(53-90) 95/63 (11/11 1000) SpO2:  [96 %-100 %] 97 % (11/11 0800)  PHYSICAL EXAM:  General: Alert, cooperative, no distress, appears stated age.  Lungs: Clear to auscultation bilaterally. No Wheezing or Rhonchi. No rales. Heart: irregular. Abdomen: Soft, non-tender,not distended. Bowel sounds normal. No masses Extremities: atraumatic, no cyanosis. No edema. No clubbing Skin: No rashes or lesions. Or bruising Lymph: Cervical, supraclavicular normal. Neurologic: Grossly non-focal  Lab Results Recent Labs    06/06/21 0030 06/06/21 1228 06/07/21 0414  WBC 26.2*  --  24.6*  HGB 14.7  --  13.0  HCT 43.9  --  37.9*  NA 135 135 137  K 4.0 4.1 3.8  CL 107 108 109  CO2 17* 18* 21*  BUN 46* 56* 66*  CREATININE 2.73* 2.74* 2.42*   Liver Panel Recent Labs    06/05/21 1350  PROT 5.9*  ALBUMIN 3.1*  AST 36  ALT 16  ALKPHOS 114  BILITOT 1.6*  Microbiology:  Studies/Results: CT ABDOMEN PELVIS WO CONTRAST  Result Date: 06/05/2021 CLINICAL DATA:  Abdominal pain, acute, nonlocalized. Heart failure. Anticoagulated. EXAM: CT ABDOMEN AND PELVIS WITHOUT CONTRAST TECHNIQUE: Multidetector CT imaging of the abdomen and pelvis was performed following the standard protocol without IV contrast. COMPARISON:  01/01/2018 FINDINGS: Lower chest: Mild  atelectasis or scarring in both lung bases. Mild cardiomegaly. Hepatobiliary: Liver parenchyma appears normal without contrast. The gallbladder is somewhat distended. No calcified gallstones are seen. Pancreas: Normal Spleen: Normal Adrenals/Urinary Tract: Adrenal glands are prominent, possibly hyperplastic. The right kidney is normal. No cyst, mass, stone or hydronephrosis. There is hydronephrosis of the left kidney with the ureter dilated to the level of the lower pole of the kidney where there is a 13 mm stone causing the obstruction. No stone distal to that. No stone in the bladder. Stomach/Bowel: Previous gastric bypass. No acute bowel pathology is seen. Vascular/Lymphatic: Aortic atherosclerosis. IVC is normal. No adenopathy. Reproductive: Normal Other: No free fluid or air. Musculoskeletal: Chronic spinal degenerative changes with solid bridging osteophytes suggesting diffuse idiopathic skeletal hyperostosis. IMPRESSION: Acute hydronephrosis on the left. 13 mm stone in the proximal left ureter causing the obstruction. Previous gastric bypass. Aortic Atherosclerosis (ICD10-I70.0). Electronically Signed   By: Snelson Chimes M.D.   On: 06/05/2021 20:31   CT HEAD WO CONTRAST (5MM)  Result Date: 06/05/2021 CLINICAL DATA:  Mental status changes. Heart failure. History of acoustic neuroma. EXAM: CT HEAD WITHOUT CONTRAST TECHNIQUE: Contiguous axial images were obtained from the base of the skull through the vertex without intravenous contrast. COMPARISON:  MRI 12/17/2020.  CT 10/27/2019. 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 Fat packing along the surgical approach. No CT evidence of residual mass. MRI  would be more sensitive. Cerebral hemispheres do not show any acute or focal finding otherwise. There is ventriculomegaly, new when compared to CT scan of April 2021 but similar to the MRI in May of this year. Vascular: No abnormal vascular finding. 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 No evidence of residual or recurrent mass. Hydrocephalus, similar to the study of 12/17/2020 and newly seen since April of 2021. This could be significant. Has this been considered? Electronically Signed   By: Ojala Chimes M.D.   On: 06/05/2021 20:36   DG Chest Portable 1 View  Result Date: 06/05/2021 CLINICAL DATA:  Weakness. EXAM: PORTABLE CHEST 1 VIEW COMPARISON:  January 08, 2018. FINDINGS: Stable cardiomegaly. Both lungs are clear. The visualized skeletal structures are unremarkable. IMPRESSION: No active disease. Electronically Signed   By: Marijo Conception M.D.   On: 06/05/2021 14:28   DG OR UROLOGY CYSTO IMAGE ([place])  Result Date: 06/05/2021 There is no interpretation for this exam.  This order is for images obtained during a surgical procedure.  Please See "Surgeries" Tab for more information regarding the procedure.     Assessment/Plan: ?Septic shock due to klebsiella bacteremia and complicated UTI Continue ceftriaxone  Left hydroueteronephrosis due to PUJ obstruction with stone S/p stent placement Persistent leucocytosis If wbc does not decline tomorrow , will need US kidneys to look for resolution of hydronephrosis  Afib with RVR Increase in troponin due to demand ischemia Followed by Cards   AKI due to the above     Ventriculomegaly- with NPH followed at [facility]- Shunt postponed due to afib   H/o rt sided schwannoma resection complicated by CSF leak and repair June 2021   H/o gastric bypass  Discussed the management with patient. ID will follow him peripherally this weekend. Call if needed

## 2021-06-07 NOTE — Progress Notes (Signed)
[facility] Progress Note  Patient: Brandon Marks    EHM:[id number]  DOA: 06/05/2021     Date of Service: the patient was seen and examined on 06/07/2021  Chief Complaint  Patient presents with   Weakness   Brief hospital course: 65 y.o  male with significant PMH of HTN, dCHF, OSA (on CPAP), HLD, atrial fibrillation, Roux-en-Y in 2019, vestibular schwannoma (s/p translab approach for resection on 36/62/9476 complicated by recurrent CSF leak and seizures return to OR for CSF leak repair on 01/06/2020) and high-volume LP on 01/15/2021 at [place] who presented to the ED with chief complaints of rigors and chills.   Patient is a poor historian history mostly obtained from patient's chart and EMS notes.  Per EMS, on arrival patient was complaining of back pain and constipation.  His initial vitals showed heart rate of 50 to 60 bpm with systolic blood pressure between 110s to 120s.  En route to the ED, patient went into A. fib with RVR with heart rate in the 170s.  Denies shortness of breath, chest pain, diarrhea, nausea or vomiting.   ED Course: On arrival to the ED, he was afebrile with blood pressure (!) 74/53 mm Hg and pulse rate(!) 162 beats/min, RR (!) 24,  oxygen saturation 98% on 2 L.  There were no focal neurological deficits; he was alert and oriented x3.  Patient received 1 L bolus of normal saline and 10 mg IV Dilts boluses x 2 with slight improvement in heart rate.  Pertinent Labs in Red/Diagnostics Findings: Na+/ K+: 136/3.7 Glucose: 90 BUN/Cr: 39/2.47 Calcium: 8.4 AST/ALT:36/16 Bilirubin: 1.6   WBC/ TMAX: 13.9>28.2/ afebrile Hgb/Hct: 14.3>12.4/41/35.9 Plts: 106>86 PCT: pending Lactic acid: 4.6>3.2 COVID PCR: Negative   Troponin: 109>102 BNP: 738 Venous Blood Gas result:  pO2 62; pCO2 36; pH 7.33;  HCO3 19, %O2 Sat 89.5.   EKG: show atrial fibrillation with rapid ventricular response at 174 bpm with a narrow QRS, normal axis, prolonged QTC otherwise normal intervals,  nonspecific ST changes. Imaging : CXR: No active cardiopulmonary process.  CT abd/pelvis>Acute hydronephrosis on the left. 13 mm stone in the proximal left ureter causing the obstruction.   Patient remained hypotensive despite IV fluid resuscitation therefore was started on Levophed and phenylephrine.  PCCM consulted for admission and further management. Discussed CT findings with on call Urologist Dr. Erlene Quan.   Assessment and Plan:  Sepsis with Septic Shock Secondary to Obstructive Uropathy Lactic: 4.6, Baseline PCT: 64.57, UA: pending, CXR: No active Disease, CT: Obstructive stone  S/p 2 L of NS & Cefepime/ Vancomycin/ Metronidazole, tress dose steroids hydrocortisone 100 x 1 dose. S/p Levophed in the ICU, weaned off, currently vital signs are stable but is still blood pressure is soft. Lactic acid 2.8--still elevated Blood culture growing Enterobacterales and  Klebsiella pneumonia Urine culture growing Klebsiella pneumonia -Monitor WBC/ fever curve -IV antibiotics, continue ceftriaxone 2 g IV daily -IVF hydration as needed Patient was stabilized in the ICU and downgraded under hospitalist service on 11/10 ID consulted for antibiotics and duration of treatment    Obstructive Uropathy secondary to Left Proximal ureteral Calculus S/p Left ureteral stent placement -UA positive and Urine Cx pending -Foley in place for decompression -PRN pain management -IV Abx as above pending cultures -Urology following, appreciate input   Acute Kidney Injury, Postrenal in the setting of obstructive uropathy Lactic Acidosis Metabolic acidosis Baseline creatinine 0.96 -Trend Lactate -Monitor I&O's / urinary output -Follow BMP -Ensure adequate renal perfusion -Avoid nephrotoxic agents as able -Replace  electrolytes as indicated Started bicarbonate oral supplement Creatinine 2.4 today, continue to monitor renal functions daily  Acute Encephalopathy likely multifactorial in the setting of sepsis   and Underlying ventriculomegaly PMHx: vestibular schwannoma (s/p resection on 20/94/7096 complicated by recurrent CSF leak and seizures, Normal Pressure Hydrocephalus pending VP shunt placement Per [facility]. -CT head with worsening hydrocephalus, consider [facility] consult. Patient follows with Dr. Deatra Canter Zomorodi at [facility] -Provide supportive care -Neuro checks per protocol -Seizure precautions   Atrial Fibrillation with RVR Hx: HLD, Chronic Diastolic CHF (last known EF 62%) BNP slightly Elevated, Hypertension, OSA on CPAP 11/10 resumed Eliquis Continue amiodarone IV infusion for rate control Cardiology consulted for further recommendation Follow 2D echocardiogram   Body mass index is 30.99 kg/m.  Interventions:   Microdata 11/9: SARS-CoV-2 PCR> negative 11/9: Influenza PCR> negative 11/9: Blood culture x2> 11/9: Urine Culture> 11/9: MRSA PCR>>     Antibiotics Vancomycin 11/9--11/10 Cefepime 11/9--11/10 Metronidazole 11/9>--11/10 Ceftriaxone 11/10 >--   Consults Urology, Cardiology, ID  Procedures 11/9> Left Ureteral stent placement  Diet: Heart healthy and carb modified DVT Prophylaxis: Therapeutic Anticoagulation with request    Advance goals of care discussion: Full code  Family Communication: family was NOT present at bedside, at the time of interview.  The pt provided permission to discuss medical plan with the family. Opportunity was given to ask question and all questions were answered satisfactorily.   Disposition:  Pt is from Home, admitted with septic shock, still has bacteremia and vital signs not stable, which precludes a safe discharge. Discharge to home, when stable may require 2 to 3 days more to improve.  Subjective:  No significant overnight events, patient was seen in the ICU, still vital signs are not stable, will continue to monitor in the ICU.  Patient denies any active issues, he was just feeling some pain in  the left flank off and on 7/10, sharp but not constant. Resting comfortably without any acute distress, denied any chest palpitations, no any other active issues.   Physical Exam: General:  NAD,  oriented to time, place, and person.  Appear in no distress, affect appropriate Eyes: PERRLA ENT: Oral Mucosa Clear, moist  Neck: no JVD,  Cardiovascular: S1 and S2 Present, no Murmur,  Respiratory: good respiratory effort, Bilateral Air entry equal and Decreased, no Crackles, no wheezes Abdomen: Bowel Sound present, Soft and no tenderness,  Skin: no rashes Extremities: no Pedal edema, no calf tenderness Neurologic: without any new focal findings Gait not checked due to patient safety concerns  Vitals:   06/07/21 1300 06/07/21 1400 06/07/21 1500 06/07/21 1600  BP: 105/81 98/73 97/83  108/84  Pulse:      Resp: (!) 26 19 18 19   Temp:      TempSrc:      SpO2:      Weight:      Height:        Intake/Output Summary (Last 24 hours) at 06/07/2021 1637 Last data filed at 06/07/2021 1600 Gross per 24 hour  Intake 521.87 ml  Output 925 ml  Net -403.13 ml   Filed Weights   06/05/21 1347 06/05/21 2030 06/06/21 0355  Weight: 108.8 kg 109.5 kg 109.5 kg    Data Reviewed: I have personally reviewed and interpreted daily labs, tele strips, imagings as discussed above. I reviewed all nursing notes, pharmacy notes, vitals, pertinent old records I have discussed plan of care as described above with RN and patient/family.  CBC: Recent Labs  Lab 06/05/21  1350 06/05/21 1811 06/06/21 0030 06/07/21 0414  WBC 13.9* 28.2* 26.2* 24.6*  HGB 14.3 12.4* 14.7 13.0  HCT 41.0 35.9* 43.9 37.9*  MCV 88.9 91.1 93.8 86.9  PLT 106* 86* 85* 77*   Basic Metabolic Panel: Recent Labs  Lab 06/05/21 1350 06/05/21 1811 06/05/21 2148 06/06/21 0030 06/06/21 1228 06/07/21 0414  NA 136  --  135 135 135 137  K 3.7  --  3.7 4.0 4.1 3.8  CL 105  --  108 107 108 109  CO2 17*  --  18* 17* 18* 21*  GLUCOSE  90  --  109* 101* 123* 90  BUN 39*  --  45* 46* 56* 66*  CREATININE 2.47* 2.41* 2.68* 2.73* 2.74* 2.42*  CALCIUM 8.4*  --  7.8* 7.9* 7.9* 7.9*  MG  --   --  2.1  --   --  2.3  PHOS  --   --   --   --   --  3.1    Studies: ECHOCARDIOGRAM COMPLETE  Result Date: 06/07/2021    ECHOCARDIOGRAM REPORT   Patient Name:   SHENOUDA GENOVA Date of Exam: 06/07/2021 Medical Rec #:  [id number]       Height:       74.0 in Accession #:    [id number]      Weight:       241.4 lb Date of Birth:  1955-11-23       BSA:          2.354 m Patient Age:    65 years        BP:           95/59 mmHg Patient Gender: M               HR:           109 bpm. Exam Location:  [facility] Procedure: 2D Echo, Color Doppler and Cardiac Doppler Indications:     Atrial Fibrillation I48.91  History:         Patient has no prior history of Echocardiogram examinations.                  Risk Factors:Hypertension, Dyslipidemia and Sleep Apnea. PAF.  Sonographer:     Sherrie Sport Referring Phys:  [id number] CHRISTOPHER RONALD BERGE Diagnosing Phys: Ida Rogue MD  Sonographer Comments: Technically challenging study due to limited acoustic windows and suboptimal apical window. Best view is subcostal. IMPRESSIONS  1. Challenging images.  2. Left ventricular ejection fraction, by estimation, is 30 to 35%. The left ventricle has moderately decreased function. The left ventricle demonstrates global hypokinesis. Left ventricular diastolic parameters are indeterminate.  3. Right ventricular systolic function is mildly reduced. The right ventricular size is mildly enlarged.  4. Left atrial size was mildly dilated.  5. The mitral valve is normal in structure. No evidence of mitral valve regurgitation. No evidence of mitral stenosis.  6. The aortic valve was not well visualized. Aortic valve regurgitation is not visualized. No aortic stenosis is present. FINDINGS  Left Ventricle: Left ventricular ejection fraction, by estimation, is 30 to 35%. The left ventricle has  moderately decreased function. The left ventricle demonstrates global hypokinesis. The left ventricular internal cavity size was normal in size. There is no left ventricular hypertrophy. Left ventricular diastolic parameters are indeterminate. Right Ventricle: The right ventricular size is mildly enlarged. No increase in right ventricular wall thickness. Right ventricular systolic function is mildly reduced. Left Atrium: Left atrial size was mildly  dilated. Right Atrium: Right atrial size was normal in size. Pericardium: There is no evidence of pericardial effusion. Mitral Valve: The mitral valve is normal in structure. No evidence of mitral valve regurgitation. No evidence of mitral valve stenosis. Tricuspid Valve: The tricuspid valve is normal in structure. Tricuspid valve regurgitation is not demonstrated. No evidence of tricuspid stenosis. Aortic Valve: The aortic valve was not well visualized. Aortic valve regurgitation is not visualized. No aortic stenosis is present. Aortic valve mean gradient measures 1.0 mmHg. Aortic valve peak gradient measures 2.1 mmHg. Aortic valve area, by VTI measures 3.95 cm. Pulmonic Valve: The pulmonic valve was normal in structure. Pulmonic valve regurgitation is not visualized. No evidence of pulmonic stenosis. Aorta: The aortic root is normal in size and structure. Venous: The pulmonary veins were not well visualized. The inferior vena cava was not well visualized. The inferior vena cava is normal in size with greater than 50% respiratory variability, suggesting right atrial pressure of 3 mmHg. IAS/Shunts: No atrial level shunt detected by color flow Doppler.  LEFT VENTRICLE PLAX 2D LVIDd:         4.50 cm   Diastology LVIDs:         3.90 cm   LV e' medial:    2.18 cm/s LV PW:         1.40 cm   LV E/e' medial:  26.9 LV IVS:        0.90 cm   LV e' lateral:   5.44 cm/s LVOT diam:     2.10 cm   LV E/e' lateral: 10.8 LV SV:         36 LV SV Index:   15 LVOT Area:     3.46 cm  RIGHT  VENTRICLE RV Basal diam:  4.50 cm RV S prime:     7.29 cm/s TAPSE (M-mode): 3.7 cm LEFT ATRIUM              Index        RIGHT ATRIUM           Index LA diam:        4.40 cm  1.87 cm/m   RA Area:     22.10 cm LA Vol (A2C):   120.0 ml 50.97 ml/m  RA Volume:   73.30 ml  31.13 ml/m LA Vol (A4C):   103.0 ml 43.75 ml/m LA Biplane Vol: 113.0 ml 48.00 ml/m  AORTIC VALVE AV Area (Vmax):    2.84 cm AV Area (Vmean):   2.92 cm AV Area (VTI):     3.95 cm AV Vmax:           72.45 cm/s AV Vmean:          48.500 cm/s AV VTI:            0.091 m AV Peak Grad:      2.1 mmHg AV Mean Grad:      1.0 mmHg LVOT Vmax:         59.40 cm/s LVOT Vmean:        40.900 cm/s LVOT VTI:          0.104 m LVOT/AV VTI ratio: 1.14  AORTA Ao Root diam: 2.80 cm MITRAL VALVE               TRICUSPID VALVE MV Area (PHT): 3.91 cm    TR Peak grad:   12.8 mmHg MV Decel Time: 194 msec    TR Vmax:  179.00 cm/s MV E velocity: 58.70 cm/s                            SHUNTS                            Systemic VTI:  0.10 m                            Systemic Diam: 2.10 cm Ida Rogue MD Electronically signed by Ida Rogue MD Signature Date/Time: 06/07/2021/2:37:03 PM    Final     Scheduled Meds:  apixaban  5 mg Oral BID   Chlorhexidine Gluconate Cloth  6 each Topical Q0600   sodium bicarbonate  650 mg Oral QID   sodium chloride flush  3 mL Intravenous Q12H   Continuous Infusions:  sodium chloride 250 mL (06/05/21 1704)   sodium chloride     amiodarone 30 mg/hr (06/07/21 1600)   cefTRIAXone (ROCEPHIN)  IV Stopped (06/07/21 0708)   PRN Meds: sodium chloride, acetaminophen, docusate sodium, ondansetron (ZOFRAN) IV, polyethylene glycol, sodium chloride flush  Time spent: 35 minutes  Author: Val Riles. MD [facility] 06/07/2021 4:37 PM  To reach On-call, see care teams to locate the attending and reach out to them via [email or web address]. If 7PM-7AM, please contact night-coverage If you still have difficulty reaching the  attending provider, please page the [facility] (Director on Call) for [facility] on amion for assistance.

## 2021-06-08 DIAGNOSIS — I4891 Unspecified atrial fibrillation: Secondary | ICD-10-CM

## 2021-06-08 DIAGNOSIS — E782 Mixed hyperlipidemia: Secondary | ICD-10-CM | POA: Diagnosis not present

## 2021-06-08 DIAGNOSIS — I10 Essential (primary) hypertension: Secondary | ICD-10-CM | POA: Diagnosis not present

## 2021-06-08 DIAGNOSIS — I420 Dilated cardiomyopathy: Secondary | ICD-10-CM | POA: Diagnosis not present

## 2021-06-08 DIAGNOSIS — A419 Sepsis, unspecified organism: Secondary | ICD-10-CM

## 2021-06-08 DIAGNOSIS — E669 Obesity, unspecified: Secondary | ICD-10-CM

## 2021-06-08 LAB — CBC
HCT: 40.2 % (ref 39.0–52.0)
Hemoglobin: 14.2 g/dL (ref 13.0–17.0)
MCH: 31.2 pg (ref 26.0–34.0)
MCHC: 35.3 g/dL (ref 30.0–36.0)
MCV: 88.4 fL (ref 80.0–100.0)
Platelets: 95 10*3/uL — ABNORMAL LOW (ref 150–400)
RBC: 4.55 MIL/uL (ref 4.22–5.81)
RDW: 14.8 % (ref 11.5–15.5)
WBC: 15.2 10*3/uL — ABNORMAL HIGH (ref 4.0–10.5)
nRBC: 0 % (ref 0.0–0.2)

## 2021-06-08 LAB — CULTURE, BLOOD (ROUTINE X 2)
Special Requests: ADEQUATE
Special Requests: ADEQUATE

## 2021-06-08 LAB — BASIC METABOLIC PANEL
Anion gap: 7 (ref 5–15)
BUN: 57 mg/dL — ABNORMAL HIGH (ref 8–23)
CO2: 26 mmol/L (ref 22–32)
Calcium: 8.1 mg/dL — ABNORMAL LOW (ref 8.9–10.3)
Chloride: 105 mmol/L (ref 98–111)
Creatinine, Ser: 1.59 mg/dL — ABNORMAL HIGH (ref 0.61–1.24)
GFR, Estimated: 47 mL/min — ABNORMAL LOW (ref >60)
Glucose, Bld: 78 mg/dL (ref 70–99)
Potassium: 3.7 mmol/L (ref 3.5–5.1)
Sodium: 138 mmol/L (ref 135–145)

## 2021-06-08 LAB — LACTIC ACID, PLASMA: Lactic Acid, Venous: 1.1 mmol/L (ref 0.5–1.9)

## 2021-06-08 LAB — APTT
aPTT: 32 seconds (ref 24–36)
aPTT: 42 seconds — ABNORMAL HIGH (ref 24–36)
aPTT: 54 seconds — ABNORMAL HIGH (ref 24–36)

## 2021-06-08 LAB — PHOSPHORUS: Phosphorus: 2.2 mg/dL — ABNORMAL LOW (ref 2.5–4.6)

## 2021-06-08 LAB — HEPARIN LEVEL (UNFRACTIONATED): Heparin Unfractionated: >1.1 IU/mL — ABNORMAL HIGH (ref 0.30–0.70)

## 2021-06-08 LAB — MAGNESIUM: Magnesium: 2.3 mg/dL (ref 1.7–2.4)

## 2021-06-08 MED ORDER — ATORVASTATIN CALCIUM 20 MG PO TABS
40.0000 mg | ORAL_TABLET | Freq: Every day | ORAL | Status: DC
  Administered 2021-06-08 – 2021-06-12 (×5): 40 mg via ORAL
  Filled 2021-06-08 (×5): qty 2

## 2021-06-08 MED ORDER — HEPARIN BOLUS VIA INFUSION
3000.0000 [IU] | Freq: Once | INTRAVENOUS | Status: AC
  Administered 2021-06-08: 3000 [IU] via INTRAVENOUS
  Filled 2021-06-08: qty 3000

## 2021-06-08 MED ORDER — CEFAZOLIN SODIUM-DEXTROSE 2-4 GM/100ML-% IV SOLN
2.0000 g | Freq: Three times a day (TID) | INTRAVENOUS | Status: DC
  Administered 2021-06-09 – 2021-06-12 (×10): 2 g via INTRAVENOUS
  Filled 2021-06-08 (×13): qty 100

## 2021-06-08 MED ORDER — SODIUM CHLORIDE 0.9% FLUSH
3.0000 mL | Freq: Two times a day (BID) | INTRAVENOUS | Status: DC
  Administered 2021-06-08 – 2021-06-12 (×9): 3 mL via INTRAVENOUS

## 2021-06-08 MED ORDER — ASPIRIN 81 MG PO CHEW
81.0000 mg | CHEWABLE_TABLET | Freq: Every day | ORAL | Status: DC
  Administered 2021-06-08 – 2021-06-09 (×2): 81 mg via ORAL
  Filled 2021-06-08 (×2): qty 1

## 2021-06-08 MED ORDER — METOPROLOL TARTRATE 25 MG PO TABS
12.5000 mg | ORAL_TABLET | Freq: Four times a day (QID) | ORAL | Status: DC
  Administered 2021-06-08 – 2021-06-09 (×5): 12.5 mg via ORAL
  Filled 2021-06-08 (×5): qty 1

## 2021-06-08 MED ORDER — HEPARIN (PORCINE) 25000 UT/250ML-% IV SOLN
2000.0000 [IU]/h | INTRAVENOUS | Status: DC
  Administered 2021-06-08: 2100 [IU]/h via INTRAVENOUS
  Administered 2021-06-08: 1500 [IU]/h via INTRAVENOUS
  Administered 2021-06-09 (×2): 2100 [IU]/h via INTRAVENOUS
  Administered 2021-06-10: 2000 [IU]/h via INTRAVENOUS
  Filled 2021-06-08 (×5): qty 250

## 2021-06-08 NOTE — Consult Note (Signed)
ANTICOAGULATION CONSULT NOTE - Initial Consult  Pharmacy Consult for heparin  Indication: atrial fibrillation  Allergies  Allergen Reactions   Codeine Nausea And Vomiting   Oxycodone Other (See Comments)    Upset GI    Patient Measurements: Height: 6\' 2"  (188 cm) Weight: 110.1 kg (242 lb 12.8 oz) IBW/kg (Calculated) : 82.2 Heparin Dosing Weight: 104.8kg  Vital Signs: Temp: 98.7 F (37.1 C) (11/12 2045) Temp Source: Oral (11/12 1600) BP: 124/89 (11/12 2045) Pulse Rate: 87 (11/12 2045)  Labs: Recent Labs    06/06/21 0030 06/06/21 1228 06/07/21 0414 06/08/21 0639 06/08/21 0837 06/08/21 1452 06/08/21 2145  HGB 14.7  --  13.0 14.2  --   --   --   HCT 43.9  --  37.9* 40.2  --   --   --   PLT 85*  --  77* 95*  --   --   --   APTT  --   --   --   --  32 42* 54*  HEPARINUNFRC  --   --   --   --  >1.10*  --   --   CREATININE 2.73* 2.74* 2.42* 1.59*  --   --   --      Estimated Creatinine Clearance: 59.6 mL/min (A) (by C-G formula based on SCr of 1.59 mg/dL (H)).   Medical History: Past Medical History:  Diagnosis Date   Arthritis    CSF leak    a. 12/2019 following resection of vestibular schwannoma; b.  12/2020 status post lumbar puncture for ventriculomegaly; e.  01/2021: Status post lumbar drain ([facility]).  Pending VP shunt.   Herniated disc    High cholesterol    History of stress test    a. 09/2007 MV: EF 63%, small area of anterolateral and apical reversibility; b.  12/2016 MV: EF 62%.  Fixed small, mild mid anteroseptal and apical defect without reversibility.  Most likely attenuation.  Low risk study.   Hypertension    Morbid obesity ([place])    PAC (premature atrial contraction)    Persistent atrial fibrillation ([place])    a. first noted on 12 lead ECG 10/2019.   Sleep apnea    non compliant with c-pap   Vestibular schwannoma ([place])    a. 12/27/2019 s/p resection ([facility]). Post-op course complicated by CSF leak/seizures req repair 01/06/2020 and high volume LP on  01/16/2020.    Medications:  Scheduled:   aspirin  81 mg Oral Daily   atorvastatin  40 mg Oral Daily   Chlorhexidine Gluconate Cloth  6 each Topical Q0600   heparin  3,000 Units Intravenous Once   metoprolol tartrate  12.5 mg Oral Q6H   sodium chloride flush  3 mL Intravenous Q12H    Assessment: 65yo male with PMH of HTN, dCHF, OSA (on CPAP), HLD, atrial fibrillation on Eliquis who was admitted to the hospital for rigors and chills. Patient is experiencing increase in acute encephalopathy and was transitioned form Eliquis to heparin infusion. Pharmacy was consulted for heparin infusion. Pt with new thrombocytopenia prior to heparin starting.    Last dose of Eliquis: 11/11 2157  11/12 1452 aPTT 42. HL (@0837 ) > 1.1 11/12 2145 aPTT 54  subtherapeutic   Goal of Therapy:  aPTT 66-102 seconds Monitor platelets by anticoagulation protocol: Yes   Plan:  11/12: aPTT @ 2145 = 54, subtherapeutic  Will order heparin 3000 units IV X 1 and increase drip rate to 2100 units/hr. Will recheck aPTT 6 hrs after rate  change.   Jacquees Gongora D, PharmD 06/08/2021 10:24 PM

## 2021-06-08 NOTE — Progress Notes (Signed)
PROGRESS NOTE    MAHONRI SEIDEN  BMW:[id number] DOB: 05/07/1956 DOA: 06/05/2021 PCP: Ladell Pier, MD    Brief Narrative:  Mr. Cockrell was admitted to the hospital with the working diagnosis of left pyelonephritis in the setting of obstructive uropathy complicated with septic shock.   65 year old male with past medical history for hypertension, diastolic heart failure, obstructive sleep apnea, dyslipidemia, atrial fibrillation, vestibular schwannoma (sp resection), and gastric bypass surgery, who presented with fevers and chills.  Patient was not able to give detailed history, apparently EMS was called to due to back pain and constipation.  On route to the hospital he developed atrial fibrillation with rapid ventricular response, in the emergency department his blood pressure was 74/53, heart rate 162, respiratory rate 24 and oxygen saturation 98% on 2 L per nasal cannula.  His lungs are clear to auscultation bilaterally, heart S1-S2, present, tachycardic, abdomen soft, no lower extremity edema.  Sodium 136, potassium 3.7, chloride 105, bicarb 17, glucose 90, BUN 39, creatinine 2.47, height sensitive troponin 109-102.  White count 13.9, hemoglobin 14.3, hematocrit 41.0, platelets 106. SARS COVID-19 negative.  Urinalysis specific gravity 1.017, > 50 white cells, >50 red cells.  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  Positive hydrocephalus.  CT of the abdomen with acute hydronephrosis on the left.  13 mm stone in the proximal left ureter causing obstruction.  Chest radiograph no infiltrates.  EKG 174 bpm, normal axis, QTC 528, atrial fibrillation rhythm, no significant ST segment or T wave changes.  Patient received volume resuscitation with persistent hypotension. Required vasopressors with norepinephrine. She was placed on broad-spectrum antibiotic therapy.  Urology was consulted and patient underwent left left ureteral stent  placement on November 9.  Her blood cultures have been positive for Enterococcus and Klebsiella pneumonia.  For his atrial fibrillation patient was placed on amiodarone.  Patient transferred to [facility] on 05/07/21.   Assessment & Plan:   Principal Problem:   Sepsis secondary to UTI [facility]) Active Problems:   Hyperlipidemia   Essential hypertension   Obesity (BMI 30-39.9)   Severe sepsis with septic shock (HCC)   Left ureteral calculus   Atrial fibrillation with RVR (HCC)   Left pyelonephritis, complicated with septic shock and obstructive uropathy. Gram negative bacteremia with Klebsiella Pna. Acute metabolic encephalopathy.  Resolved sepsis, now off vasopressors, his blood pressure has been 102 to 272 mmHg systolic.  Sp left ureteral stent placement per urology.  Wbc 15.2, Klebsiella Pneumonia sensitive to cephalosporins.  Patient has been afebrile. Mentation has improved, encephalopathy has resolved.   Plan to continue antibiotic therapy with Cefazolin  Continue to follow up on cell count and temperature curve.   Ok to transfer to telemetry ward.   2. AKI renal function with serum cr down to 1,59, K is 3,7 and serum bicarbonate at 26, Mg is 2,3 Patient is tolerating po well, documented urine output 700 cc  Hold on IV fluids and follow up renal function in am, avoid hypotension and nephrotoxic medications.   3. Chronic atrial fibrillation with RVR  Rate controlled with metoprolol and anticoagulation with heparin drip.   Echocardiogram with decreased LV systolic function down to 30 to 35% with global hypokinesis, RV systolic function mildly reduced. Mild dilatation of left atrium.   Amiodarone has been discontinue and now transition to metoprolol, anticoagulation with heparin in preparation for coronary angiography.   4. HTN , new systolic heart failure  No signs of acute volume  overload, echocardiogram with reduction in LV systolic function with EF 30 to 35% with global  hypokinesis.  Continue blood pressure monitoring.   Plan for ischemic workup during this hospitalization,   5. Dyslipidemia, obesity class 1. Continue with atorvastatin.  Calculated BMI is 30,9  Patient continue to be at high risk for worsening sepsis   Status is: Inpatient  Remains inpatient appropriate because: IV antibiotic therapy and telemetry monitoring    DVT prophylaxis:  Heparin   Code Status:   full  Family Communication:   No family at the bedside     Consultants:  [facility] Urology   Procedures:  Ureteral stent   Antimicrobials:   Ceftriaxone change to cefazolin     Subjective:  Patient is feeling better, no nausea or vomiting, no chest pain or dyspnea, no palpitations, continue to be very weak and deconditioned, positive urinary incontinence.   Objective: Vitals:   06/08/21 0600 06/08/21 0700 06/08/21 0800 06/08/21 0900  BP: (!) 113/92 123/86 (!) 117/102 111/73  Pulse:      Resp: 11 17 16 18   Temp:   98.2 F (36.8 C)   TempSrc:   Oral   SpO2: 99%  100%   Weight:      Height:        Intake/Output Summary (Last 24 hours) at 06/08/2021 1035 Last data filed at 06/08/2021 0900 Gross per 24 hour  Intake 571.63 ml  Output 875 ml  Net -303.37 ml   Filed Weights   06/05/21 1347 06/05/21 2030 06/06/21 0355  Weight: 108.8 kg 109.5 kg 109.5 kg    Examination:   General: Not in pain or dyspnea, deconditioned  Neurology: Awake and alert, non focal  E ENT: no pallor, no icterus, oral mucosa moist Cardiovascular: No JVD. S1-S2 present, irregularly irregular, no gallops, rubs, or murmurs. No lower extremity edema. Pulmonary: positive breath sounds bilaterally, adequate air movement, no wheezing, rhonchi or rales. Gastrointestinal. Abdomen soft and non tender Skin. No rashes Musculoskeletal: no joint deformities     Data Reviewed: I have personally reviewed following labs and imaging studies  CBC: Recent Labs  Lab 06/05/21 1350 06/05/21 1811  06/06/21 0030 06/07/21 0414 06/08/21 0639  WBC 13.9* 28.2* 26.2* 24.6* 15.2*  HGB 14.3 12.4* 14.7 13.0 14.2  HCT 41.0 35.9* 43.9 37.9* 40.2  MCV 88.9 91.1 93.8 86.9 88.4  PLT 106* 86* 85* 77* 95*   Basic Metabolic Panel: Recent Labs  Lab 06/05/21 2148 06/06/21 0030 06/06/21 1228 06/07/21 0414 06/08/21 0639  NA 135 135 135 137 138  K 3.7 4.0 4.1 3.8 3.7  CL 108 107 108 109 105  CO2 18* 17* 18* 21* 26  GLUCOSE 109* 101* 123* 90 78  BUN 45* 46* 56* 66* 57*  CREATININE 2.68* 2.73* 2.74* 2.42* 1.59*  CALCIUM 7.8* 7.9* 7.9* 7.9* 8.1*  MG 2.1  --   --  2.3 2.3  PHOS  --   --   --  3.1 2.2*   GFR: Estimated Creatinine Clearance: 59.4 mL/min (A) (by C-G formula based on SCr of 1.59 mg/dL (H)). Liver Function Tests: Recent Labs  Lab 06/05/21 1350  AST 36  ALT 16  ALKPHOS 114  BILITOT 1.6*  PROT 5.9*  ALBUMIN 3.1*   Recent Labs  Lab 06/05/21 2148  LIPASE 19   Recent Labs  Lab 06/05/21 2147  AMMONIA 35   Coagulation Profile: No results for input(s): INR, PROTIME in the last 168 hours. Cardiac Enzymes: No results for input(s): CKTOTAL, CKMB,  CKMBINDEX, TROPONINI in the last 168 hours. BNP (last 3 results) No results for input(s): PROBNP in the last 8760 hours. HbA1C: No results for input(s): HGBA1C in the last 72 hours. CBG: Recent Labs  Lab 06/05/21 1350 06/05/21 2029  GLUCAP 91 95   Lipid Profile: No results for input(s): CHOL, HDL, LDLCALC, TRIG, CHOLHDL, LDLDIRECT in the last 72 hours. Thyroid Function Tests: Recent Labs    06/05/21 2148  TSH 1.493   Anemia Panel: No results for input(s): VITAMINB12, FOLATE, FERRITIN, TIBC, IRON, RETICCTPCT in the last 72 hours.    Radiology Studies: I have reviewed all of the imaging during this hospital visit personally     Scheduled Meds:  Chlorhexidine Gluconate Cloth  6 each Topical Q0600   metoprolol tartrate  12.5 mg Oral Q6H   sodium chloride flush  3 mL Intravenous Q12H   Continuous  Infusions:  sodium chloride 250 mL (06/05/21 1704)   sodium chloride     cefTRIAXone (ROCEPHIN)  IV Stopped (06/08/21 0826)   heparin 1,500 Units/hr (06/08/21 0900)     LOS: 3 days        Ruchy Wildrick Gerome Apley, MD

## 2021-06-08 NOTE — Consult Note (Signed)
ANTICOAGULATION CONSULT NOTE - Initial Consult  Pharmacy Consult for heparin  Indication: atrial fibrillation  Allergies  Allergen Reactions   Codeine Nausea And Vomiting   Oxycodone Other (See Comments)    Upset GI    Patient Measurements: Height: 6\' 2"  (188 cm) Weight: 109.5 kg (241 lb 6.5 oz) IBW/kg (Calculated) : 82.2 Heparin Dosing Weight: 104.8kg  Vital Signs: Temp: 98.2 F (36.8 C) (11/12 0800) Temp Source: Oral (11/12 1200) BP: 121/86 (11/12 1300)  Labs: Recent Labs    06/05/21 1552 06/05/21 1811 06/06/21 0030 06/06/21 1228 06/07/21 0414 06/08/21 0639 06/08/21 0837 06/08/21 1452  HGB  --    < > 14.7  --  13.0 14.2  --   --   HCT  --    < > 43.9  --  37.9* 40.2  --   --   PLT  --    < > 85*  --  77* 95*  --   --   APTT  --   --   --   --   --   --  32 42*  HEPARINUNFRC  --   --   --   --   --   --  >1.10*  --   CREATININE  --    < > 2.73* 2.74* 2.42* 1.59*  --   --   TROPONINIHS 102*  --   --   --   --   --   --   --    < > = values in this interval not displayed.     Estimated Creatinine Clearance: 59.4 mL/min (A) (by C-G formula based on SCr of 1.59 mg/dL (H)).   Medical History: Past Medical History:  Diagnosis Date   Arthritis    CSF leak    a. 12/2019 following resection of vestibular schwannoma; b.  12/2020 status post lumbar puncture for ventriculomegaly; e.  01/2021: Status post lumbar drain ([facility]).  Pending VP shunt.   Herniated disc    High cholesterol    History of stress test    a. 09/2007 MV: EF 63%, small area of anterolateral and apical reversibility; b.  12/2016 MV: EF 62%.  Fixed small, mild mid anteroseptal and apical defect without reversibility.  Most likely attenuation.  Low risk study.   Hypertension    Morbid obesity ([place])    PAC (premature atrial contraction)    Persistent atrial fibrillation ([place])    a. first noted on 12 lead ECG 10/2019.   Sleep apnea    non compliant with c-pap   Vestibular schwannoma ([place])    a. 12/27/2019  s/p resection ([facility]). Post-op course complicated by CSF leak/seizures req repair 01/06/2020 and high volume LP on 01/16/2020.    Medications:  Scheduled:   aspirin  81 mg Oral Daily   atorvastatin  40 mg Oral Daily   Chlorhexidine Gluconate Cloth  6 each Topical Q0600   metoprolol tartrate  12.5 mg Oral Q6H   sodium chloride flush  3 mL Intravenous Q12H    Assessment: 65yo male with PMH of HTN, dCHF, OSA (on CPAP), HLD, atrial fibrillation on Eliquis who was admitted to the hospital for rigors and chills. Patient is experiencing increase in acute encephalopathy and was transitioned form Eliquis to heparin infusion. Pharmacy was consulted for heparin infusion. Pt with new thrombocytopenia prior to heparin starting.    Last dose of Eliquis: 11/11 2157  11/13 1452 aPTT 42. HL (@0837 ) > 1.1  Goal of Therapy:  aPTT 66-102  seconds Monitor platelets by anticoagulation protocol: Yes   Plan:  aPTT is subtherapeutic. Will give heparin bolus of 3000 units and increase heparin infusion to 1700 units/hr. Recheck aPTT in 6 hours. Heparin level and CBC daily while on heparin. Switch to heparin level monitoring once aPTT and heparin level correlate. Pt has low platelets - continue to monitor.   Oswald Hillock, PharmD 06/08/2021 3:14 PM

## 2021-06-08 NOTE — Consult Note (Signed)
ANTICOAGULATION CONSULT NOTE - Initial Consult  Pharmacy Consult for heparin  Indication: atrial fibrillation  Allergies  Allergen Reactions   Codeine Nausea And Vomiting   Oxycodone Other (See Comments)    Upset GI    Patient Measurements: Height: 6\' 2"  (188 cm) Weight: 109.5 kg (241 lb 6.5 oz) IBW/kg (Calculated) : 82.2 Heparin Dosing Weight: 104.8kg  Vital Signs: Temp: 98.2 F (36.8 C) (11/12 0800) Temp Source: Oral (11/12 0800) BP: 117/102 (11/12 0800)  Labs: Recent Labs    06/05/21 1350 06/05/21 1552 06/05/21 1811 06/06/21 0030 06/06/21 1228 06/07/21 0414 06/08/21 0639  HGB 14.3  --    < > 14.7  --  13.0 14.2  HCT 41.0  --    < > 43.9  --  37.9* 40.2  PLT 106*  --    < > 85*  --  77* 95*  CREATININE 2.47*  --    < > 2.73* 2.74* 2.42* 1.59*  TROPONINIHS 109* 102*  --   --   --   --   --    < > = values in this interval not displayed.    Estimated Creatinine Clearance: 59.4 mL/min (A) (by C-G formula based on SCr of 1.59 mg/dL (H)).   Medical History: Past Medical History:  Diagnosis Date   Arthritis    CSF leak    a. 12/2019 following resection of vestibular schwannoma; b.  12/2020 status post lumbar puncture for ventriculomegaly; e.  01/2021: Status post lumbar drain ([facility]).  Pending VP shunt.   Herniated disc    High cholesterol    History of stress test    a. 09/2007 MV: EF 63%, small area of anterolateral and apical reversibility; b.  12/2016 MV: EF 62%.  Fixed small, mild mid anteroseptal and apical defect without reversibility.  Most likely attenuation.  Low risk study.   Hypertension    Morbid obesity ([place])    PAC (premature atrial contraction)    Persistent atrial fibrillation ([place])    a. first noted on 12 lead ECG 10/2019.   Sleep apnea    non compliant with c-pap   Vestibular schwannoma ([place])    a. 12/27/2019 s/p resection ([facility]). Post-op course complicated by CSF leak/seizures req repair 01/06/2020 and high volume LP on 01/16/2020.     Medications:  Scheduled:   Chlorhexidine Gluconate Cloth  6 each Topical Q0600   metoprolol tartrate  12.5 mg Oral Q6H   sodium chloride flush  3 mL Intravenous Q12H    Assessment: 65yo male with PMH of HTN, dCHF, OSA (on CPAP), HLD, atrial fibrillation on Eliquis who was admitted to the hospital for rigors and chills. Patient is experiencing increase in acute encephalopathy and was transitioned form Eliquis to heparin infusion. Pharmacy was consulted for heparin infusion.   Last dose of Eliquis: 11/11 2157  Baseline HL and aPTT ordered  CBC on 11/12 PLT noted to be 95, otherwise WNL  Goal of Therapy:  aPTT 66-102 seconds Monitor platelets by anticoagulation protocol: Yes   Plan:  No bolus given due to recent administration of Eliquis Start heparin infusion at 1500 units/hr Will monitor aPTT 6 hours from start of heparin infusion Continue to monitor H&H and platelets with daily CBC while on heparin infusion   Narda Rutherford, PharmD Pharmacy Resident  06/08/2021 8:18 AM

## 2021-06-08 NOTE — Plan of Care (Signed)
Neuro: alert and oriented, very stiff moving, very modest, visibly uncomfortable during assessments and bed changes Resp: stable on room air CV: afebrile, vital signs stable, some generalized edema GIGU: condom cath/external cath applied due to repeated bed wetting/urinal spilling, no BM, tolerating PO well Skin: clean and intact with skin tear L forearm Social: patient communicating with family/friends via personal cell phone.  Events: Transferred to room 234, report given to Janett Billow  Problem: Education: Goal: Knowledge of General Education information will improve Description: Including pain rating scale, medication(s)/side effects and non-pharmacologic comfort measures Outcome: Progressing   Problem: Health Behavior/Discharge Planning: Goal: Ability to manage health-related needs will improve Outcome: Progressing   Problem: Clinical Measurements: Goal: Ability to maintain clinical measurements within normal limits will improve Outcome: Progressing Goal: Will remain free from infection Outcome: Progressing Goal: Diagnostic test results will improve Outcome: Progressing Goal: Respiratory complications will improve Outcome: Progressing Goal: Cardiovascular complication will be avoided Outcome: Progressing   Problem: Activity: Goal: Risk for activity intolerance will decrease Outcome: Progressing   Problem: Nutrition: Goal: Adequate nutrition will be maintained Outcome: Progressing   Problem: Coping: Goal: Level of anxiety will decrease Outcome: Progressing   Problem: Elimination: Goal: Will not experience complications related to bowel motility Outcome: Progressing Goal: Will not experience complications related to urinary retention Outcome: Progressing   Problem: Pain Managment: Goal: General experience of comfort will improve Outcome: Progressing   Problem: Safety: Goal: Ability to remain free from injury will improve Outcome: Progressing   Problem: Skin  Integrity: Goal: Risk for impaired skin integrity will decrease Outcome: Progressing

## 2021-06-08 NOTE — Progress Notes (Signed)
Progress Note  Patient Name: Brandon Marks Date of Encounter: 06/08/2021  Primary Cardiologist: Sanda Klein, MD  Subjective   Feeling a little bit better this morning.  Less abdominal discomfort.  Notes some urinary incontinence.  No chest pain or dyspnea.  Pressures improving.  Heart rates mostly in the 90s.  Discussed echo results at length.  Inpatient Medications    Scheduled Meds:  Chlorhexidine Gluconate Cloth  6 each Topical Q0600   metoprolol tartrate  12.5 mg Oral Q6H   sodium chloride flush  3 mL Intravenous Q12H   Continuous Infusions:  sodium chloride 250 mL (06/05/21 1704)   sodium chloride     cefTRIAXone (ROCEPHIN)  IV Stopped (06/08/21 0826)   heparin 1,500 Units/hr (06/08/21 0900)   PRN Meds: sodium chloride, acetaminophen, docusate sodium, ondansetron (ZOFRAN) IV, polyethylene glycol, sodium chloride flush   Vital Signs    Vitals:   06/08/21 0600 06/08/21 0700 06/08/21 0800 06/08/21 0900  BP: (!) 113/92 123/86 (!) 117/102 111/73  Pulse:      Resp: 11 17 16 18   Temp:   98.2 F (36.8 C)   TempSrc:   Oral   SpO2: 99%  100%   Weight:      Height:        Intake/Output Summary (Last 24 hours) at 06/08/2021 1046 Last data filed at 06/08/2021 0900 Gross per 24 hour  Intake 571.63 ml  Output 875 ml  Net -303.37 ml   Filed Weights   06/05/21 1347 06/05/21 2030 06/06/21 0355  Weight: 108.8 kg 109.5 kg 109.5 kg    Physical Exam   GEN: Well nourished, well developed, in no acute distress.  HEENT: Grossly normal.  Neck: Supple, no JVD, carotid bruits, or masses. Cardiac: Distant heart sounds, irregularly irregular, no murmurs, rubs, or gallops. No clubbing, cyanosis, edema.  Radials 2+, DP/PT 2+ and equal bilaterally.  Respiratory:  Respirations regular and unlabored, clear to auscultation bilaterally. GI: Soft, nontender, nondistended, BS + x 4. MS: no deformity or atrophy. Skin: warm and dry, no rash. Neuro:  Strength and sensation are  intact. Psych: AAOx3.  Normal affect.  Labs    Chemistry Recent Labs  Lab 06/05/21 1350 06/05/21 1811 06/06/21 1228 06/07/21 0414 06/08/21 0639  NA 136   < > 135 137 138  K 3.7   < > 4.1 3.8 3.7  CL 105   < > 108 109 105  CO2 17*   < > 18* 21* 26  GLUCOSE 90   < > 123* 90 78  BUN 39*   < > 56* 66* 57*  CREATININE 2.47*   < > 2.74* 2.42* 1.59*  CALCIUM 8.4*   < > 7.9* 7.9* 8.1*  PROT 5.9*  --   --   --   --   ALBUMIN 3.1*  --   --   --   --   AST 36  --   --   --   --   ALT 16  --   --   --   --   ALKPHOS 114  --   --   --   --   BILITOT 1.6*  --   --   --   --   GFRNONAA 28*   < > 25* 29* 47*  ANIONGAP 14   < > 9 7 7    < > = values in this interval not displayed.     Hematology Recent Labs  Lab 06/06/21 0030 06/07/21 0092  06/08/21 0639  WBC 26.2* 24.6* 15.2*  RBC 4.68 4.36 4.55  HGB 14.7 13.0 14.2  HCT 43.9 37.9* 40.2  MCV 93.8 86.9 88.4  MCH 31.4 29.8 31.2  MCHC 33.5 34.3 35.3  RDW 15.0 14.4 14.8  PLT 85* 77* 95*    Cardiac Enzymes  Recent Labs  Lab 06/05/21 1350 06/05/21 1552  TROPONINIHS 109* 102*      BNP Recent Labs  Lab 06/05/21 1350  BNP 738.0*    Lipids  Lab Results  Component Value Date   CHOL 99 (L) 12/27/2018   HDL 49 12/27/2018   LDLCALC 41 12/27/2018   TRIG 43 12/27/2018   CHOLHDL 2.0 12/27/2018    Radiology    CT ABDOMEN PELVIS WO CONTRAST  Result Date: 06/05/2021 CLINICAL DATA:  Abdominal pain, acute, nonlocalized. Heart failure. Anticoagulated. EXAM: CT ABDOMEN AND PELVIS WITHOUT CONTRAST TECHNIQUE: Multidetector CT imaging of the abdomen and pelvis was performed following the standard protocol without IV contrast. COMPARISON:  01/01/2018 FINDINGS: Lower chest: Mild atelectasis or scarring in both lung bases. Mild cardiomegaly. Hepatobiliary: Liver parenchyma appears normal without contrast. The gallbladder is somewhat distended. No calcified gallstones are seen. Pancreas: Normal Spleen: Normal Adrenals/Urinary Tract:  Adrenal glands are prominent, possibly hyperplastic. The right kidney is normal. No cyst, mass, stone or hydronephrosis. There is hydronephrosis of the left kidney with the ureter dilated to the level of the lower pole of the kidney where there is a 13 mm stone causing the obstruction. No stone distal to that. No stone in the bladder. Stomach/Bowel: Previous gastric bypass. No acute bowel pathology is seen. Vascular/Lymphatic: Aortic atherosclerosis. IVC is normal. No adenopathy. Reproductive: Normal Other: No free fluid or air. Musculoskeletal: Chronic spinal degenerative changes with solid bridging osteophytes suggesting diffuse idiopathic skeletal hyperostosis. IMPRESSION: Acute hydronephrosis on the left. 13 mm stone in the proximal left ureter causing the obstruction. Previous gastric bypass. Aortic Atherosclerosis (ICD10-I70.0). Electronically Signed   By: Mun Chimes M.D.   On: 06/05/2021 20:31   CT HEAD WO CONTRAST (5MM)  Result Date: 06/05/2021 CLINICAL DATA:  Mental status changes. Heart failure. History of acoustic neuroma. EXAM: CT HEAD WITHOUT CONTRAST TECHNIQUE: Contiguous axial images were obtained from the base of the skull through the vertex without intravenous contrast. COMPARISON:  MRI 12/17/2020.  CT 10/27/2019. 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 Fat packing along the surgical approach. No CT evidence of residual mass. MRI would be more sensitive. Cerebral hemispheres do not show any acute or focal finding otherwise. There is ventriculomegaly, new when compared to CT scan of April 2021 but similar to the MRI in May of this year. Vascular: No abnormal vascular finding. 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 No evidence of residual or recurrent mass. Hydrocephalus, similar to the study of 12/17/2020 and newly seen since  April of 2021. This could be significant. Has this been considered? Electronically Signed   By: Narayanan Chimes M.D.   On: 06/05/2021 20:36   DG Chest Portable 1 View  Result Date: 06/05/2021 CLINICAL DATA:  Weakness. EXAM: PORTABLE CHEST 1 VIEW COMPARISON:  January 08, 2018. FINDINGS: Stable cardiomegaly. Both lungs are clear. The visualized skeletal structures are unremarkable. IMPRESSION: No active disease. Electronically Signed   By: Marijo Conception M.D.   On: 06/05/2021 14:28   DG OR UROLOGY CYSTO IMAGE ([place])  Result Date: 06/05/2021 There  is no interpretation for this exam.  This order is for images obtained during a surgical procedure.  Please See "Surgeries" Tab for more information regarding the procedure.   Telemetry    A. fib, mostly in the 90s- Personally Reviewed  Cardiac Studies   Lexiscan Cardiolite 6.2018    12/2016 MV: EF 62%.  Fixed small, mild mid anteroseptal and apical defect without reversibility.  Most likely attenuation.  Low risk study. _____________  2D Echocardiogram 11.11.2022   1. Challenging images.   2. Left ventricular ejection fraction, by estimation, is 30 to 35%. The  left ventricle has moderately decreased function. The left ventricle  demonstrates global hypokinesis. Left ventricular diastolic parameters are  indeterminate.   3. Right ventricular systolic function is mildly reduced. The right  ventricular size is mildly enlarged.   4. Left atrial size was mildly dilated.   5. The mitral valve is normal in structure. No evidence of mitral valve  regurgitation. No evidence of mitral stenosis.   6. The aortic valve was not well visualized. Aortic valve regurgitation  is not visualized. No aortic stenosis is present.   Patient Profile   65 y.o. male with a history of persistent atrial fibrillation, obesity status post gastric bypass, vestibular schwannoma status post resection complicated by CSF leak requiring lumbar punctures/drain,  hyperlipidemia, hypertension, and prior low risk stress tests, who was admitted 11/9 w/ sepsis, AKI in the setting of L hydronephrosis/obstructive uropathy/ureteral stone s/p emergent stenting, klebsiella bacteremia, elevated HsTrop, and afib w/ RVR (likely persistent).  Echo w/ EF of 30-35%, glob HK.  Assessment & Plan    1.  Sepsis/Klebsiella bacteremia: In the setting of left ureteral stone with hydronephrosis, obstructive uropathy, and acute kidney injury.  Status post left ureteral stenting.  Antibiotics per IM/ID.  Creatinine improving.  2.  Atrial fibrillation with right ventricular spots: Likely present in at least the paroxysmal nature dating back to April 2021 and more persistent since June of this year based on ECGs available in Care Everywhere.  Previous seen by [facility] cardiology in September with referral to EP due to unsteady gait, falls, high risk for bleeding, along with multiple recurrent procedures related to CSF leak-?  Benefit of watchman.  Has not seen EP yet.  Admitted November 9 with left ureteral stone and sepsis, along with A. fib with RVR.  He has been on IV amiodarone though pressures have improved significantly.  Rates currently in the 90s as he continues to feel better.  We will transition him to oral metoprolol-12.5 mg every 6 hours and titrate as tolerated.  He has been receiving Eliquis ongoing to transition this to heparin in the setting of LV dysfunction on echo which will require further ischemic evaluation as outlined below.  3.  Cardiomyopathy/elevated high-sensitivity troponin: In the setting of above, echocardiogram showed an EF of 30 to 35% with global hypokinesis.  Patient has been euvolemic on examination.  In light of mild troponin elevation, multiple risk factors, LV dysfunction, and requirement for cardiac clearance for future VP shunt at [facility] and lithotripsy, he will require diagnostic catheterization.  He has been on Eliquis, which I will discontinue (he has not  yet received this morning's dose).  I will switch him over to heparin.  His creatinine continues to improve and is 1.59 this morning.  Provided that he remains hemodynamically stable, will likely plan on diagnostic catheterization on Monday.  We will add aspirin and statin.  4.  AKI/obstructive uropathy: Status post left ureteral stenting  with plan for outpatient lithotripsy.  Creatinine continues to improve and is 1.59 this morning.  5.  Hydrocephalus: Pending VP shunt at [facility].  In the setting of above, will require further ischemic evaluation for preoperative cardiovascular evaluation.  Signed, Murray Hodgkins, NP  06/08/2021, 10:46 AM    For questions or updates, please contact   Please consult [email or web address] for contact info under Cardiology/STEMI.

## 2021-06-08 NOTE — Consult Note (Signed)
Pharmacy Antibiotic Note  Brandon Marks is a 65 y.o. male admitted on 06/05/2021 with bacteremia.  Pharmacy has been consulted for ancef dosing.  119 Ucx 20,000 COLONIES/mL KLEBSIELLA PNEUMONIAE Abnormal  11/9 Bcx 4 out of 4 bottles growing KLEBSIELLA PNEUMONIAE  Plan: Day 3 of abx. D/c ceftriaxone. Will start cefazolin 2 g q8H.   Height: 6\' 2"  (188 cm) Weight: 109.5 kg (241 lb 6.5 oz) IBW/kg (Calculated) : 82.2  Temp (24hrs), Avg:98.3 F (36.8 C), Min:98.2 F (36.8 C), Max:98.4 F (36.9 C)  Recent Labs  Lab 06/05/21 1350 06/05/21 1552 06/05/21 1811 06/05/21 2147 06/05/21 2148 06/06/21 0030 06/06/21 1228 06/07/21 0414 06/08/21 0639  WBC 13.9*  --  28.2*  --   --  26.2*  --  24.6* 15.2*  CREATININE 2.47*  --  2.41*  --  2.68* 2.73* 2.74* 2.42* 1.59*  LATICACIDVEN  --  4.6* 3.2* 2.8*  --  4.2*  --   --  1.1    Estimated Creatinine Clearance: 59.4 mL/min (A) (by C-G formula based on SCr of 1.59 mg/dL (H)).    Allergies  Allergen Reactions   Codeine Nausea And Vomiting   Oxycodone Other (See Comments)    Upset GI    Antimicrobials this admission: 11/10 ceftriaxone >> 11/12 11/12 ancef >>   Dose adjustments this admission: None  Microbiology results: 11/9 BCx: KLEBSIELLA PNEUMONIAE 11/9 UCx: KLEBSIELLA PNEUMONIAE   Thank you for allowing pharmacy to be a part of this patient's care.  Oswald Hillock, PharmD, BCPS 06/08/2021 12:21 PM

## 2021-06-08 NOTE — Progress Notes (Signed)
PT Cancellation Note  Patient Details Name: NIMAI BURBACH MRN: [id number] DOB: 03-11-1956   Cancelled Treatment:    Reason Eval/Treat Not Completed: Other (comment).  PT consult received.  Chart reviewed.  Pt reporting being "sleepy" and tired from today's activities and requesting therapist return tomorrow for PT evaluation.  Will re-attempt PT evaluation tomorrow.  Leitha Bleak, PT 06/08/21, 4:50 PM

## 2021-06-09 DIAGNOSIS — E782 Mixed hyperlipidemia: Secondary | ICD-10-CM | POA: Diagnosis not present

## 2021-06-09 DIAGNOSIS — A419 Sepsis, unspecified organism: Secondary | ICD-10-CM | POA: Diagnosis not present

## 2021-06-09 DIAGNOSIS — I10 Essential (primary) hypertension: Secondary | ICD-10-CM | POA: Diagnosis not present

## 2021-06-09 DIAGNOSIS — I4891 Unspecified atrial fibrillation: Secondary | ICD-10-CM | POA: Diagnosis not present

## 2021-06-09 DIAGNOSIS — I420 Dilated cardiomyopathy: Secondary | ICD-10-CM | POA: Diagnosis not present

## 2021-06-09 LAB — CBC
HCT: 41.4 % (ref 39.0–52.0)
Hemoglobin: 14.3 g/dL (ref 13.0–17.0)
MCH: 29.9 pg (ref 26.0–34.0)
MCHC: 34.5 g/dL (ref 30.0–36.0)
MCV: 86.6 fL (ref 80.0–100.0)
Platelets: 114 10*3/uL — ABNORMAL LOW (ref 150–400)
RBC: 4.78 MIL/uL (ref 4.22–5.81)
RDW: 14.6 % (ref 11.5–15.5)
WBC: 14.3 10*3/uL — ABNORMAL HIGH (ref 4.0–10.5)
nRBC: 0 % (ref 0.0–0.2)

## 2021-06-09 LAB — BASIC METABOLIC PANEL
Anion gap: 7 (ref 5–15)
BUN: 44 mg/dL — ABNORMAL HIGH (ref 8–23)
CO2: 25 mmol/L (ref 22–32)
Calcium: 7.9 mg/dL — ABNORMAL LOW (ref 8.9–10.3)
Chloride: 105 mmol/L (ref 98–111)
Creatinine, Ser: 1.1 mg/dL (ref 0.61–1.24)
GFR, Estimated: >60 mL/min (ref >60)
Glucose, Bld: 90 mg/dL (ref 70–99)
Potassium: 3.9 mmol/L (ref 3.5–5.1)
Sodium: 137 mmol/L (ref 135–145)

## 2021-06-09 LAB — HEPARIN LEVEL (UNFRACTIONATED): Heparin Unfractionated: 1 IU/mL — ABNORMAL HIGH (ref 0.30–0.70)

## 2021-06-09 LAB — APTT
aPTT: 76 seconds — ABNORMAL HIGH (ref 24–36)
aPTT: 71 seconds — ABNORMAL HIGH (ref 24–36)

## 2021-06-09 MED ORDER — METOPROLOL TARTRATE 25 MG PO TABS
12.5000 mg | ORAL_TABLET | Freq: Once | ORAL | Status: AC
  Administered 2021-06-09: 12.5 mg via ORAL
  Filled 2021-06-09: qty 1

## 2021-06-09 MED ORDER — METOPROLOL TARTRATE 25 MG PO TABS
25.0000 mg | ORAL_TABLET | Freq: Two times a day (BID) | ORAL | Status: DC
  Administered 2021-06-09 – 2021-06-11 (×4): 25 mg via ORAL
  Filled 2021-06-09 (×4): qty 1

## 2021-06-09 NOTE — Consult Note (Signed)
ANTICOAGULATION CONSULT NOTE -   Pharmacy Consult for heparin  Indication: atrial fibrillation  Allergies  Allergen Reactions   Codeine Nausea And Vomiting   Oxycodone Other (See Comments)    Upset GI    Patient Measurements: Height: 6\' 2"  (188 cm) Weight: 110.6 kg (243 lb 13.3 oz) IBW/kg (Calculated) : 82.2 Heparin Dosing Weight: 104.8kg  Vital Signs: Temp: 97.8 F (36.6 C) (11/13 1100) BP: 136/99 (11/13 1100) Pulse Rate: 93 (11/13 1100)  Labs: Recent Labs    06/07/21 0414 06/08/21 0639 06/08/21 0837 06/08/21 1452 06/08/21 2145 06/09/21 0615 06/09/21 1234  HGB 13.0 14.2  --   --   --  14.3  --   HCT 37.9* 40.2  --   --   --  41.4  --   PLT 77* 95*  --   --   --  114*  --   APTT  --   --  32   < > 54* 76* 71*  HEPARINUNFRC  --   --  >1.10*  --   --  1.00*  --   CREATININE 2.42* 1.59*  --   --   --  1.10  --    < > = values in this interval not displayed.     Estimated Creatinine Clearance: 86.3 mL/min (by C-G formula based on SCr of 1.1 mg/dL).   Medical History: Past Medical History:  Diagnosis Date   Arthritis    CSF leak    a. 12/2019 following resection of vestibular schwannoma; b.  12/2020 status post lumbar puncture for ventriculomegaly; e.  01/2021: Status post lumbar drain ([facility]).  Pending VP shunt.   Herniated disc    High cholesterol    History of stress test    a. 09/2007 MV: EF 63%, small area of anterolateral and apical reversibility; b.  12/2016 MV: EF 62%.  Fixed small, mild mid anteroseptal and apical defect without reversibility.  Most likely attenuation.  Low risk study.   Hypertension    Morbid obesity ([place])    PAC (premature atrial contraction)    Persistent atrial fibrillation ([place])    a. first noted on 12 lead ECG 10/2019.   Sleep apnea    non compliant with c-pap   Vestibular schwannoma ([place])    a. 12/27/2019 s/p resection ([facility]). Post-op course complicated by CSF leak/seizures req repair 01/06/2020 and high volume LP on 01/16/2020.     Medications:  Scheduled:   atorvastatin  40 mg Oral Daily   metoprolol tartrate  25 mg Oral BID   sodium chloride flush  3 mL Intravenous Q12H    Assessment: 65yo male with PMH of HTN, dCHF, OSA (on CPAP), HLD, atrial fibrillation on Eliquis who was admitted to the hospital for rigors and chills. Patient is experiencing increase in acute encephalopathy and was transitioned form Eliquis to heparin infusion. Pharmacy was consulted for heparin infusion. Pt with new thrombocytopenia prior to heparin starting.    Last dose of Eliquis: 11/11 2157  11/12 1452 aPTT 42. HL (@0837 ) > 1.1 11/12 2145 aPTT 54  subtherapeutic inc to 2100 u/hr 11/13 0615 aPTT 76  therap x 1 11/13 1234 aPTT 71  therap x 2   Goal of Therapy:  aPTT 66-102 seconds Monitor platelets by anticoagulation protocol: Yes   Plan:  11/13 @1234  aPTT= 71  therap x 2  Will continue drip rate at 2100 units/hr. Will f/u aPTT with am labs (and HL in am) HL and aPTT not correlating yet. Recheck HL  daily to assess for correlation with aPTT  Noralee Space, PharmD 06/09/2021 1:47 PM

## 2021-06-09 NOTE — Evaluation (Signed)
Physical Therapy Evaluation Patient Details Name: Brandon Marks MRN: [id number] DOB: 07-20-1956 Today's Date: 06/09/2021  History of Present Illness  Pt is a 65 y.o. male presenting to hospital 11/9 with not feeling well and shaking spells.  Pt admitted with obstructing L proximal ureteral calculus, AKI, sepsis, AMS, and a-fib with RVR.  S/p L ureteral stent placement 06/05/21.  PMH includes herniated disc, htn, morbid obesity, sleep apnea, B TKA, OSA on CPAP, brain tumor (removed 12/27/19), necrotizing fasciitis R buttock, gastric bypass; also h/o vestibular schwannoma status postresection complicated by CSF leak requiring lumbar puncture, drain, requiring a VP shunt not yet done.  Clinical Impression  Prior to hospital admission, pt was modified independent ambulating with RW; lives alone in 1 level home with 2 STE L railing.  Currently pt is CGA to min assist with transfers and to walk a few feet recliner to bed with RW.  Pt became dizzy when standing (pt reports dizziness d/t needing shunt placed s/p brain surgery); mobility limited d/t dizziness and general weakness.  Pt would benefit from skilled PT to address noted impairments and functional limitations (see below for any additional details).  Upon hospital discharge, pt would benefit from SNF.    Recommendations for follow up therapy are one component of a multi-disciplinary discharge planning process, led by the attending physician.  Recommendations may be updated based on patient status, additional functional criteria and insurance authorization.  Follow Up Recommendations Skilled nursing-short term rehab (<3 hours/day)    Assistance Recommended at Discharge Frequent or constant Supervision/Assistance  Functional Status Assessment Patient has had a recent decline in their functional status and demonstrates the ability to make significant improvements in function in a reasonable and predictable amount of time.  Equipment Recommendations   Rolling walker (2 wheels)    Recommendations for Other Services OT consult     Precautions / Restrictions Precautions Precautions: Fall Restrictions Weight Bearing Restrictions: No      Mobility  Bed Mobility Overal bed mobility: Needs Assistance Bed Mobility: Sit to Supine     Sit to supine: Min assist   General bed mobility comments: assist for head support    Transfers Overall transfer level: Needs assistance Equipment used: Rolling walker (2 wheels) Transfers: Sit to/from Stand Sit to Stand: Min guard;Min assist Stand pivot transfers: Min guard         General transfer comment: CGA to stand from recliner 1st trial and min assist to stand from recliner 2nd trial; use of RW    Ambulation/Gait Ambulation/Gait assistance: Min guard Gait Distance (Feet): 3 Feet (recliner to bed) Assistive device: Rolling walker (2 wheels)   Gait velocity: decreased     General Gait Details: increased effort to take steps; decreased B LE step length/foot clearance/heelstrike  Stairs            Wheelchair Mobility    Modified Rankin (Stroke Patients Only)       Balance Overall balance assessment: Needs assistance Sitting-balance support: No upper extremity supported;Feet supported Sitting balance-Leahy Scale: Good Sitting balance - Comments: steady sitting reaching within BOS   Standing balance support: Single extremity supported Standing balance-Leahy Scale: Fair Standing balance comment: steady static standing with at least single UE support                             Pertinent Vitals/Pain Pain Assessment: No/denies pain Vitals (HR and O2 on room air) stable and WFL throughout treatment session.  Home Living Family/patient expects to be discharged to:: Private residence Living Arrangements: Alone Available Help at Discharge: Friend(s);Available PRN/intermittently Type of Home: House Home Access: Stairs to enter Entrance Stairs-Rails:  Left Entrance Stairs-Number of Steps: 2   Home Layout: One level Home Equipment: Advice worker (2 wheels)      Prior Function Prior Level of Function : Driving;History of Falls (last six months);Needs assist       Physical Assist : ADLs (physical)   ADLs (physical):  (cleaning/household chores) Mobility Comments: Ambulates with RW. ADLs Comments: Able to manage ADL's and IADL's modified independently     Hand Dominance   Dominant Hand: Right    Extremity/Trunk Assessment   Upper Extremity Assessment Upper Extremity Assessment: Defer to OT evaluation    Lower Extremity Assessment Lower Extremity Assessment: Generalized weakness (at least 3/5 AROM hip flexion, knee flexion/extension, and DF/PF)    Cervical / Trunk Assessment Cervical / Trunk Assessment: Normal  Communication   Communication: No difficulties  Cognition Arousal/Alertness: Awake/alert Behavior During Therapy: Flat affect Overall Cognitive Status: Within Functional Limits for tasks assessed                                 General Comments: A&O x 4, somewhat argumentative, perseverates on poor sleep while hospitalized        General Comments General comments (skin integrity, edema, etc.): pt's urine noted to be red (MD Arrien arrived during session and notified)    Exercises    Assessment/Plan    PT Assessment Patient needs continued PT services  PT Problem List Decreased strength;Decreased activity tolerance;Decreased balance;Decreased mobility       PT Treatment Interventions DME instruction;Gait training;Stair training;Functional mobility training;Therapeutic activities;Therapeutic exercise;Balance training;Patient/family education    PT Goals (Current goals can be found in the Care Plan section)  Acute Rehab PT Goals Patient Stated Goal: to improve strength and mobility PT Goal Formulation: With patient Time For Goal Achievement: 06/23/21 Potential to Achieve  Goals: Good    Frequency Min 2X/week   Barriers to discharge Decreased caregiver support      Co-evaluation               AM-PAC PT "6 Clicks" Mobility  Outcome Measure Help needed turning from your back to your side while in a flat bed without using bedrails?: None Help needed moving from lying on your back to sitting on the side of a flat bed without using bedrails?: A Little Help needed moving to and from a bed to a chair (including a wheelchair)?: A Little Help needed standing up from a chair using your arms (e.g., wheelchair or bedside chair)?: A Little Help needed to walk in hospital room?: A Little Help needed climbing 3-5 steps with a railing? : A Lot 6 Click Score: 18    End of Session Equipment Utilized During Treatment: Gait belt Activity Tolerance: Patient limited by fatigue Patient left: in bed;with call bell/phone within reach;with bed alarm set;with nursing/sitter in room (NT present assisting pt) Nurse Communication: Mobility status;Precautions PT Visit Diagnosis: Other abnormalities of gait and mobility (R26.89);Muscle weakness (generalized) (M62.81)    Time: 0174-9449 PT Time Calculation (min) (ACUTE ONLY): 29 min   Charges:   PT Evaluation $PT Eval Low Complexity: 1 Low PT Treatments $Therapeutic Activity: 8-22 mins       Leitha Bleak, PT 06/09/21, 12:28 PM

## 2021-06-09 NOTE — Consult Note (Signed)
ANTICOAGULATION CONSULT NOTE -   Pharmacy Consult for heparin  Indication: atrial fibrillation  Allergies  Allergen Reactions   Codeine Nausea And Vomiting   Oxycodone Other (See Comments)    Upset GI    Patient Measurements: Height: 6\' 2"  (188 cm) Weight: 110.6 kg (243 lb 13.3 oz) IBW/kg (Calculated) : 82.2 Heparin Dosing Weight: 104.8kg  Vital Signs: Temp: 98.7 F (37.1 C) (11/13 0751) BP: 137/97 (11/13 0751) Pulse Rate: 91 (11/13 0751)  Labs: Recent Labs    06/07/21 0414 06/08/21 0639 06/08/21 0837 06/08/21 0837 06/08/21 1452 06/08/21 2145 06/09/21 0615  HGB 13.0 14.2  --   --   --   --  14.3  HCT 37.9* 40.2  --   --   --   --  41.4  PLT 77* 95*  --   --   --   --  114*  APTT  --   --  32   < > 42* 54* 76*  HEPARINUNFRC  --   --  >1.10*  --   --   --  1.00*  CREATININE 2.42* 1.59*  --   --   --   --  1.10   < > = values in this interval not displayed.     Estimated Creatinine Clearance: 86.3 mL/min (by C-G formula based on SCr of 1.1 mg/dL).   Medical History: Past Medical History:  Diagnosis Date   Arthritis    CSF leak    a. 12/2019 following resection of vestibular schwannoma; b.  12/2020 status post lumbar puncture for ventriculomegaly; e.  01/2021: Status post lumbar drain ([facility]).  Pending VP shunt.   Herniated disc    High cholesterol    History of stress test    a. 09/2007 MV: EF 63%, small area of anterolateral and apical reversibility; b.  12/2016 MV: EF 62%.  Fixed small, mild mid anteroseptal and apical defect without reversibility.  Most likely attenuation.  Low risk study.   Hypertension    Morbid obesity ([place])    PAC (premature atrial contraction)    Persistent atrial fibrillation ([place])    a. first noted on 12 lead ECG 10/2019.   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 12/27/2019 s/p resection ([facility]). Post-op course complicated by CSF leak/seizures req repair 01/06/2020 and high volume LP on 01/16/2020.     Medications:  Scheduled:   aspirin  81 mg Oral Daily   atorvastatin  40 mg Oral Daily   Chlorhexidine Gluconate Cloth  6 each Topical Q0600   metoprolol tartrate  12.5 mg Oral Q6H   sodium chloride flush  3 mL Intravenous Q12H    Assessment: 65yo male with PMH of HTN, dCHF, OSA (on CPAP), HLD, atrial fibrillation on Eliquis who was admitted to the hospital for rigors and chills. Patient is experiencing increase in acute encephalopathy and was transitioned form Eliquis to heparin infusion. Pharmacy was consulted for heparin infusion. Pt with new thrombocytopenia prior to heparin starting.    Last dose of Eliquis: 11/11 2157  11/12 1452 aPTT 42. HL (@0837 ) > 1.1 11/12 2145 aPTT 54  subtherapeutic inc to 2100 u/hr 11/13 0615 aPTT 76  therap x 1  Goal of Therapy:  aPTT 66-102 seconds Monitor platelets by anticoagulation protocol: Yes   Plan:  11/13 @0615  aPTT= 76  therap x 1   (HL=1.00 supratherapeutic) Will continue drip rate at 2100 units/hr. Will check confirmatory aPTT in 6  hrs  HL and aPTT not correlating yet. Recheck HL daily to assess for correlation with aPTT  Abigaelle Verley A, PharmD 06/09/2021 8:18 AM

## 2021-06-09 NOTE — Progress Notes (Signed)
PROGRESS NOTE    BUDDY LOEFFELHOLZ  WER:[id number] DOB: [birth year] DOA: 06/05/2021 PCP: Ladell Pier, MD    Brief Narrative:  Mr. Boehle was admitted to the hospital with the working diagnosis of left pyelonephritis in the setting of obstructive uropathy complicated with septic shock.    65 year old male with past medical history for hypertension, diastolic heart failure, obstructive sleep apnea, dyslipidemia, atrial fibrillation, vestibular schwannoma (sp resection), and gastric bypass surgery, who presented with fevers and chills.  Patient was not able to give detailed history, apparently EMS was called to due to back pain and constipation.  On route to the hospital he developed atrial fibrillation with rapid ventricular response, in the emergency department his blood pressure was 74/53, heart rate 162, respiratory rate 24 and oxygen saturation 98% on 2 L per nasal cannula.  His lungs are clear to auscultation bilaterally, heart S1-S2, present, tachycardic, abdomen soft, no lower extremity edema.   Sodium 136, potassium 3.7, chloride 105, bicarb 17, glucose 90, BUN 39, creatinine 2.47, height sensitive troponin 109-102.  White count 13.9, hemoglobin 14.3, hematocrit 41.0, platelets 106. SARS COVID-19 negative.   Urinalysis specific gravity 1.017, > 50 white cells, >50 red cells.   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  Positive hydrocephalus.   CT of the abdomen with acute hydronephrosis on the left.  13 mm stone in the proximal left ureter causing obstruction.   Chest radiograph no infiltrates.   EKG 174 bpm, normal axis, QTC 528, atrial fibrillation rhythm, no significant ST segment or T wave changes.   Patient received volume resuscitation with persistent hypotension. Required vasopressors with norepinephrine. He was placed on broad-spectrum antibiotic therapy.   Urology was consulted and patient underwent left left ureteral stent  placement on November 9.   Her blood cultures have been positive for Enterococcus and Klebsiella pneumonia.   For his atrial fibrillation patient was placed on amiodarone, converting to sinus rhythm. Amiodarone discontinued and patient transitioned to metoprolol.   Patient transferred to [facility] on 05/07/21.   On heparin drip in preparation for cardiac catheterization for ischemia work up.   Assessment & Plan:   Principal Problem:   Sepsis secondary to UTI [facility]) Active Problems:   Hyperlipidemia   Essential hypertension   Obesity (BMI 30-39.9)   Severe sepsis with septic shock (HCC)   Left ureteral calculus   Atrial fibrillation with RVR (HCC)     Left pyelonephritis, complicated with septic shock and obstructive uropathy. Gram negative bacteremia with Klebsiella Pna. Acute metabolic encephalopathy.  Sp left ureteral stent placement per urology.  Patient transferred to telemetry ward, this am is out of bed to the chair with physical therapy. Reported to be very weak and deconditioned.  Blood culture and urine culture positive for Klebsiella Pneumonia sensitive to cephalosporins.  Today Wbc is 14.3, and has remained afebrile.  Encephalopathy has resolved.   Continue antibiotic therapy with cefazolin Follow on cell count in am.  Follow up with Pt and Ot, patient may need SNF at discharge.   2. AKI  Renal function continue to improve, serum cr today is 1,10 with K at 3,9 and serum bicarbonate at 25. Continue to hold on IV fluids and follow up renal function in am, avoid hypotension and nephrotoxic medications.  Today noted hematuria.    3. Chronic atrial fibrillation with RVR/ NEW systolic heart failure. HTN Echocardiogram with decreased LV systolic function down to 30 to 35% with global hypokinesis, RV  systolic function mildly reduced. Mild dilatation of left atrium.    Rate control with metoprolol, and anticoagulation with IV heparin Pending cardiac catheterization per  cariology recommendations, to rule out ischemic heart disease.  Discontinue aspirin in the setting of hematuria.  Continue blood pressure monitoring.    5. Dyslipidemia, obesity class 1. On atorvastatin.  Calculated BMI is 30,9   Patient continue to be at high risk for worsening sepsis   Status is: Inpatient  Remains inpatient appropriate because: IV antibiotics     DVT prophylaxis:  Heparin IV   Code Status:    full  Family Communication:   No family at the bedside      Consultants:  Cardiology  ID  Antimicrobials:  Cefazolin     Subjective: Patient is feeling better, but continue to be very weak and deconditioned, no nausea or vomiting, no dyspnea or chest pain. This am out of bed to chair with PT, positive dizziness. Had urine incontinence.   Objective: Vitals:   06/09/21 0354 06/09/21 0500 06/09/21 0751 06/09/21 1100  BP: 130/65  (!) 137/97 (!) 136/99  Pulse: 87  91 93  Resp: 18  20 20   Temp: 98.4 F (36.9 C)  98.7 F (37.1 C) 97.8 F (36.6 C)  TempSrc:      SpO2: 98%  97% 98%  Weight:  110.6 kg    Height:        Intake/Output Summary (Last 24 hours) at 06/09/2021 1151 Last data filed at 06/09/2021 1324 Gross per 24 hour  Intake 641.96 ml  Output 1000 ml  Net -358.04 ml   Filed Weights   06/06/21 0355 06/08/21 1807 06/09/21 0500  Weight: 109.5 kg 110.1 kg 110.6 kg    Examination:   General: Not in pain or dyspnea/. Deconditioned  Neurology: Awake and alert, non focal  E ENT: no pallor, no icterus, oral mucosa moist Cardiovascular: No JVD. S1-S2 present, rhythmic, no gallops, rubs, or murmurs. No lower extremity edema. Pulmonary: positive breath sounds bilaterally, with no wheezing, rhonchi or rales. Gastrointestinal. Abdomen soft and non tender Skin. No rashes Musculoskeletal: no joint deformities     Data Reviewed: I have personally reviewed following labs and imaging studies  CBC: Recent Labs  Lab 06/05/21 1811 06/06/21 0030  06/07/21 0414 06/08/21 0639 06/09/21 0615  WBC 28.2* 26.2* 24.6* 15.2* 14.3*  HGB 12.4* 14.7 13.0 14.2 14.3  HCT 35.9* 43.9 37.9* 40.2 41.4  MCV 91.1 93.8 86.9 88.4 86.6  PLT 86* 85* 77* 95* 401*   Basic Metabolic Panel: Recent Labs  Lab 06/05/21 2148 06/06/21 0030 06/06/21 1228 06/07/21 0414 06/08/21 0639 06/09/21 0615  NA 135 135 135 137 138 137  K 3.7 4.0 4.1 3.8 3.7 3.9  CL 108 107 108 109 105 105  CO2 18* 17* 18* 21* 26 25  GLUCOSE 109* 101* 123* 90 78 90  BUN 45* 46* 56* 66* 57* 44*  CREATININE 2.68* 2.73* 2.74* 2.42* 1.59* 1.10  CALCIUM 7.8* 7.9* 7.9* 7.9* 8.1* 7.9*  MG 2.1  --   --  2.3 2.3  --   PHOS  --   --   --  3.1 2.2*  --    GFR: Estimated Creatinine Clearance: 86.3 mL/min (by C-G formula based on SCr of 1.1 mg/dL). Liver Function Tests: Recent Labs  Lab 06/05/21 1350  AST 36  ALT 16  ALKPHOS 114  BILITOT 1.6*  PROT 5.9*  ALBUMIN 3.1*   Recent Labs  Lab 06/05/21 2148  LIPASE 19  Recent Labs  Lab 06/05/21 2147  AMMONIA 35   Coagulation Profile: No results for input(s): INR, PROTIME in the last 168 hours. Cardiac Enzymes: No results for input(s): CKTOTAL, CKMB, CKMBINDEX, TROPONINI in the last 168 hours. BNP (last 3 results) No results for input(s): PROBNP in the last 8760 hours. HbA1C: No results for input(s): HGBA1C in the last 72 hours. CBG: Recent Labs  Lab 06/05/21 1350 06/05/21 2029  GLUCAP 91 95   Lipid Profile: No results for input(s): CHOL, HDL, LDLCALC, TRIG, CHOLHDL, LDLDIRECT in the last 72 hours. Thyroid Function Tests: No results for input(s): TSH, T4TOTAL, FREET4, T3FREE, THYROIDAB in the last 72 hours. Anemia Panel: No results for input(s): VITAMINB12, FOLATE, FERRITIN, TIBC, IRON, RETICCTPCT in the last 72 hours.    Radiology Studies: I have reviewed all of the imaging during this hospital visit personally     Scheduled Meds:  aspirin  81 mg Oral Daily   atorvastatin  40 mg Oral Daily   Chlorhexidine  Gluconate Cloth  6 each Topical Q0600   metoprolol tartrate  25 mg Oral BID   sodium chloride flush  3 mL Intravenous Q12H   Continuous Infusions:   ceFAZolin (ANCEF) IV 2 g (06/09/21 0541)   heparin 2,100 Units/hr (06/09/21 0843)     LOS: 4 days        Leeam Cedrone Gerome Apley, MD

## 2021-06-09 NOTE — Progress Notes (Signed)
Progress Note  Patient Name: Brandon Marks Date of Encounter: 06/09/2021  Primary Cardiologist: Sanda Klein, MD  Subjective  Denies any chest pain or shortness of breath today.  No complaints overnight.    Inpatient Medications    Scheduled Meds:  aspirin  81 mg Oral Daily   atorvastatin  40 mg Oral Daily   Chlorhexidine Gluconate Cloth  6 each Topical Q0600   metoprolol tartrate  12.5 mg Oral Q6H   sodium chloride flush  3 mL Intravenous Q12H   Continuous Infusions:   ceFAZolin (ANCEF) IV 2 g (06/09/21 0541)   heparin 2,100 Units/hr (06/08/21 2243)   PRN Meds: acetaminophen, docusate sodium, ondansetron (ZOFRAN) IV, polyethylene glycol   Vital Signs    Vitals:   06/09/21 0000 06/09/21 0354 06/09/21 0500 06/09/21 0751  BP:  130/65  (!) 137/97  Pulse:  87  91  Resp: 20 18  20   Temp:  98.4 F (36.9 C)  98.7 F (37.1 C)  TempSrc:      SpO2:  98%  97%  Weight:   110.6 kg   Height:        Intake/Output Summary (Last 24 hours) at 06/09/2021 0836 Last data filed at 06/09/2021 6144 Gross per 24 hour  Intake 747.94 ml  Output 1100 ml  Net -352.06 ml    Filed Weights   06/06/21 0355 06/08/21 1807 06/09/21 0500  Weight: 109.5 kg 110.1 kg 110.6 kg    Physical Exam   GEN: Well nourished, well developed in no acute distress HEENT: Normal NECK: No JVD; No carotid bruits LYMPHATICS: No lymphadenopathy CARDIAC:RRR, no murmurs, rubs, gallops RESPIRATORY:  Clear to auscultation without rales, wheezing or rhonchi  ABDOMEN: Soft, non-tender, non-distended MUSCULOSKELETAL:  No edema; No deformity  SKIN: Warm and dry NEUROLOGIC:  Alert and oriented x 3 PSYCHIATRIC:  Normal affect   Labs    Chemistry Recent Labs  Lab 06/05/21 1350 06/05/21 1811 06/07/21 0414 06/08/21 0639 06/09/21 0615  NA 136   < > 137 138 137  K 3.7   < > 3.8 3.7 3.9  CL 105   < > 109 105 105  CO2 17*   < > 21* 26 25  GLUCOSE 90   < > 90 78 90  BUN 39*   < > 66* 57* 44*   CREATININE 2.47*   < > 2.42* 1.59* 1.10  CALCIUM 8.4*   < > 7.9* 8.1* 7.9*  PROT 5.9*  --   --   --   --   ALBUMIN 3.1*  --   --   --   --   AST 36  --   --   --   --   ALT 16  --   --   --   --   ALKPHOS 114  --   --   --   --   BILITOT 1.6*  --   --   --   --   GFRNONAA 28*   < > 29* 47* >60  ANIONGAP 14   < > 7 7 7    < > = values in this interval not displayed.      Hematology Recent Labs  Lab 06/07/21 0414 06/08/21 0639 06/09/21 0615  WBC 24.6* 15.2* 14.3*  RBC 4.36 4.55 4.78  HGB 13.0 14.2 14.3  HCT 37.9* 40.2 41.4  MCV 86.9 88.4 86.6  MCH 29.8 31.2 29.9  MCHC 34.3 35.3 34.5  RDW 14.4 14.8 14.6  PLT 77*  95* 114*     Cardiac Enzymes  Recent Labs  Lab 06/05/21 1350 06/05/21 1552  TROPONINIHS 109* 102*       BNP Recent Labs  Lab 06/05/21 1350  BNP 738.0*     Lipids  Lab Results  Component Value Date   CHOL 99 (L) 12/27/2018   HDL 49 12/27/2018   LDLCALC 41 12/27/2018   TRIG 43 12/27/2018   CHOLHDL 2.0 12/27/2018    Radiology    CT ABDOMEN PELVIS WO CONTRAST  Result Date: 06/05/2021 CLINICAL DATA:  Abdominal pain, acute, nonlocalized. Heart failure. Anticoagulated. EXAM: CT ABDOMEN AND PELVIS WITHOUT CONTRAST TECHNIQUE: Multidetector CT imaging of the abdomen and pelvis was performed following the standard protocol without IV contrast. COMPARISON:  01/01/2018 FINDINGS: Lower chest: Mild atelectasis or scarring in both lung bases. Mild cardiomegaly. Hepatobiliary: Liver parenchyma appears normal without contrast. The gallbladder is somewhat distended. No calcified gallstones are seen. Pancreas: Normal Spleen: Normal Adrenals/Urinary Tract: Adrenal glands are prominent, possibly hyperplastic. The right kidney is normal. No cyst, mass, stone or hydronephrosis. There is hydronephrosis of the left kidney with the ureter dilated to the level of the lower pole of the kidney where there is a 13 mm stone causing the obstruction. No stone distal to that. No  stone in the bladder. Stomach/Bowel: Previous gastric bypass. No acute bowel pathology is seen. Vascular/Lymphatic: Aortic atherosclerosis. IVC is normal. No adenopathy. Reproductive: Normal Other: No free fluid or air. Musculoskeletal: Chronic spinal degenerative changes with solid bridging osteophytes suggesting diffuse idiopathic skeletal hyperostosis. IMPRESSION: Acute hydronephrosis on the left. 13 mm stone in the proximal left ureter causing the obstruction. Previous gastric bypass. Aortic Atherosclerosis (ICD10-I70.0). Electronically Signed   By: Azure Chimes M.D.   On: 06/05/2021 20:31   CT HEAD WO CONTRAST (5MM)  Result Date: 06/05/2021 CLINICAL DATA:  Mental status changes. Heart failure. History of acoustic neuroma. EXAM: CT HEAD WITHOUT CONTRAST TECHNIQUE: Contiguous axial images were obtained from the base of the skull through the vertex without intravenous contrast. COMPARISON:  MRI 12/17/2020.  CT 10/27/2019. 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 Fat packing along the surgical approach. No CT evidence of residual mass. MRI would be more sensitive. Cerebral hemispheres do not show any acute or focal finding otherwise. There is ventriculomegaly, new when compared to CT scan of April 2021 but similar to the MRI in May of this year. Vascular: No abnormal vascular finding. 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 No evidence of residual or recurrent mass. Hydrocephalus, similar to the study of 12/17/2020 and newly seen since April of 2021. This could be significant. Has this been considered? Electronically Signed   By: Orlov Chimes M.D.   On: 06/05/2021 20:36   DG Chest Portable 1 View  Result Date: 06/05/2021 CLINICAL DATA:  Weakness. EXAM: PORTABLE CHEST 1 VIEW COMPARISON:  January 08, 2018. FINDINGS: Stable cardiomegaly. Both  lungs are clear. The visualized skeletal structures are unremarkable. IMPRESSION: No active disease. Electronically Signed   By: Marijo Conception M.D.   On: 06/05/2021 14:28   DG OR UROLOGY CYSTO IMAGE ([place])  Result Date: 06/05/2021 There is no interpretation for this exam.  This order is for images obtained during a surgical procedure.  Please See "Surgeries" Tab for more information regarding the procedure.   Telemetry    A. fib, mostly in the 90s- Personally Reviewed  Cardiac Studies   Lexiscan Cardiolite 6.2018    12/2016 MV: EF 62%.  Fixed small, mild mid anteroseptal and apical defect without reversibility.  Most likely attenuation.  Low risk study. _____________  2D Echocardiogram 11.11.2022   1. Challenging images.   2. Left ventricular ejection fraction, by estimation, is 30 to 35%. The  left ventricle has moderately decreased function. The left ventricle  demonstrates global hypokinesis. Left ventricular diastolic parameters are  indeterminate.   3. Right ventricular systolic function is mildly reduced. The right  ventricular size is mildly enlarged.   4. Left atrial size was mildly dilated.   5. The mitral valve is normal in structure. No evidence of mitral valve  regurgitation. No evidence of mitral stenosis.   6. The aortic valve was not well visualized. Aortic valve regurgitation  is not visualized. No aortic stenosis is present.   Patient Profile   65 y.o. male with a history of persistent atrial fibrillation, obesity status post gastric bypass, vestibular schwannoma status post resection complicated by CSF leak requiring lumbar punctures/drain, hyperlipidemia, hypertension, and prior low risk stress tests, who was admitted 11/9 w/ sepsis, AKI in the setting of L hydronephrosis/obstructive uropathy/ureteral stone s/p emergent stenting, klebsiella bacteremia, elevated HsTrop, and afib w/ RVR (likely persistent).  Echo w/ EF of 30-35%, glob HK.  Assessment & Plan     1.  Sepsis/Klebsiella bacteremia: In the setting of left ureteral stone with hydronephrosis, obstructive uropathy, and acute kidney injury.  Status post left ureteral stenting.  Antibiotics per IM/ID.   -Serum creatinine continues to improve and is now down to 1.1 from a high of 2.74  2.  Atrial fibrillation with RVR: Likely present in at least the paroxysmal nature dating back to April 2021 and more persistent since June of this year based on ECGs available in Care Everywhere.  Previous seen by [facility] cardiology in September with referral to EP due to unsteady gait, falls, high risk for bleeding, along with multiple recurrent procedures related to CSF leak-?  Benefit of watchman.  Has not seen EP yet.  Admitted November 9 with left ureteral stone and sepsis, along with A. fib with RVR.   -He has been on IV amiodarone though pressures have improved significantly.   -Heart rate is significantly improved with rates in the 90s today after stopping IV amnio yesterday and adding Lopressor 12.5 mg every 6 hours -Consolidate Lopressor to 25 mg twice daily -Eliquis is on hold for cardiac cath tomorrow to evaluate cardiomyopathy which is new -IV heparin per pharmacy protocol  3.  Cardiomyopathy/elevated high-sensitivity troponin: In the setting of above, echocardiogram showed an EF of 30 to 35% with global hypokinesis.   -He appears euvolemic on exam today  -In light of mild troponin elevation, multiple risk factors, LV dysfunction, and requirement for cardiac clearance for future VP shunt at [facility] and lithotripsy, he will require diagnostic catheterization.   -He has been on Eliquis, was discontinued yes30terday and placed on IV heparin per pharmacy -Serum creatinine is much improved and actually normalized today at 1.1 -Continue aspirin 81 mg daily, 25 mg twice daily (will consolidate to Toprol at discharge) and atorvastatin 40 mg daily -Shared Decision Making/Informed Consent The risks [stroke (1 in  1000), death (1 in 1000), kidney failure [usually temporary] (1 in 500), bleeding (1 in 200), allergic reaction [possibly serious] (1 in 200)], benefits (diagnostic support and management of coronary artery disease) and alternatives of a cardiac catheterization were discussed in detail with Mr. Dennie  and he is willing to proceed.   4.  AKI/obstructive uropathy: Status post left ureteral stenting with plan for outpatient lithotripsy.   -Creatinine continues to improve and is 1.1 this morning.  5.  Hydrocephalus: Pending VP shunt at [facility].  In the setting of above, will require further ischemic evaluation for preoperative cardiovascular evaluation.  I have spent a total of 30 minutes with patient reviewing 2D echo , telemetry, EKGs, labs and examining patient as well as establishing an assessment and plan that was discussed with the patient.  > 50% of time was spent in direct patient care.     Signed, Fransico Him, MD  06/09/2021, 8:36 AM    For questions or updates, please contact   Please consult [email or web address] for contact info under Cardiology/STEMI.

## 2021-06-09 NOTE — Progress Notes (Signed)
PT Cancellation Note  Patient Details Name: Brandon Marks MRN: [id number] DOB: 01-20-1956   Cancelled Treatment:    Reason Eval/Treat Not Completed: Other (comment).  Pt resting in bed upon PT arrival.  Pt reports still waking up (is feeling "sleepy") and wanting to eat breakfast first (pt declined PT session).  Will re-attempt PT evaluation at a later date/time as able.  Leitha Bleak, PT 06/09/21, 9:05 AM

## 2021-06-09 NOTE — Evaluation (Signed)
Occupational Therapy Evaluation Patient Details Name: Brandon Marks MRN: [id number] DOB: [birth year] Today's Date: 06/09/2021   History of Present Illness Patient is 65 y.o  male with significant PMH of HTN, dCHF, OSA (on CPAP), HLD, atrial fibrillation, Roux-en-Y in 2019, vestibular schwannoma (s/p translab approach for resection on 59/16/3846 complicated by recurrent CSF leak and seizures return to OR for CSF leak repair on 01/06/2020) and high-volume LP on 01/15/2021 at [facility] who presented to the ED with chief complaints of rigors and chills. Pt admitted with sepsis secondary to urosepsis from 12 mm proximal left ureteral stone, w/ left ureteral stent placement on 11/09.   Clinical Impression   Brandon Marks presents this AM with generalized weakness, reduced endurance, and impaired balance. Prior to admission, he lives alone, performing ADLs and IADLs with Mod I, using a RW for ambulation in and out of home, states that someone comes by his home 5 days/week to assist with cleaning and other household chores. Pt is unable to state how many hours per day this person is at his home or to give a more detailed description of tasks she performs. He is oriented to self, place, time, situation; he denies pain. He reports 3 falls at home in previous 6 months. Today Brandon Marks is able to follow simple, 1-2 step directions, but requires increased time to do so and often needs directions repeated and/or verbal cueing for task initiation. He requires greatly increased time for bed mobility but is able to complete without physical assist. CGA for other ADL tasks, including close supervision for safety and occasional cueing. Brandon Marks will benefit from ongoing OT services while hospitalized. Recommend DC to home with [facility].       Recommendations for follow up therapy are one component of a multi-disciplinary discharge planning process, led by the attending physician.  Recommendations may be updated based on patient  status, additional functional criteria and insurance authorization.   Follow Up Recommendations  Home health OT    Assistance Recommended at Discharge Intermittent Supervision/Assistance  Functional Status Assessment  Patient has had a recent decline in their functional status and demonstrates the ability to make significant improvements in function in a reasonable and predictable amount of time.  Equipment Recommendations  None recommended by OT    Recommendations for Other Services       Precautions / Restrictions Precautions Precautions: Fall Restrictions Weight Bearing Restrictions: No      Mobility Bed Mobility Overal bed mobility: Needs Assistance Bed Mobility: Supine to Sit;Sit to Supine     Supine to sit: Supervision Sit to supine: Supervision   General bed mobility comments: increased time, effort    Transfers Overall transfer level: Needs assistance Equipment used: Rolling walker (2 wheels) Transfers: Sit to/from Stand;Bed to chair/wheelchair/BSC Sit to Stand: Min guard Stand pivot transfers: Min guard         General transfer comment: requires cueing for task sequencing      Balance Overall balance assessment: Needs assistance Sitting-balance support: Bilateral upper extremity supported Sitting balance-Leahy Scale: Good     Standing balance support: Bilateral upper extremity supported Standing balance-Leahy Scale: Fair                             ADL either performed or assessed with clinical judgement   ADL Overall ADL's : Needs assistance/impaired     Grooming: Wash/dry hands;Wash/dry face;Oral care;Sitting;Modified independent;Set up  Lower Body Dressing: Minimal assistance;Sit to/from stand;Sitting/lateral leans Lower Body Dressing Details (indicate cue type and reason): requires cueing for initiation, sequencing             Functional mobility during ADLs: Min guard       Vision          Perception     Praxis      Pertinent Vitals/Pain       Hand Dominance Right   Extremity/Trunk Assessment Upper Extremity Assessment Upper Extremity Assessment: Overall WFL for tasks assessed   Lower Extremity Assessment Lower Extremity Assessment: Overall WFL for tasks assessed       Communication Communication Communication: No difficulties   Cognition Arousal/Alertness: Awake/alert Behavior During Therapy: WFL for tasks assessed/performed Overall Cognitive Status: Within Functional Limits for tasks assessed                                 General Comments: A&O x 4, somewhat argumentative, perseverates on poor sleep while hospitalized     General Comments       Exercises Other Exercises Other Exercises: Bed mobility, transfers, grooming, LB dressing. Educ re: role of OT, falls prevention, HEP   Shoulder Instructions      Home Living Family/patient expects to be discharged to:: Private residence Living Arrangements: Alone Available Help at Discharge: Friend(s);Available PRN/intermittently Type of Home: House Home Access: Stairs to enter [employer] of Steps: 3   Home Layout: One level     Bathroom Shower/Tub: Occupational psychologist: Standard     Home Equipment: Advice worker (2 wheels)          Prior Functioning/Environment Prior Level of Function : Driving;History of Falls (last six months);Needs assist             Mobility Comments: Pt uses RW in home and community, drives, does his own shopping, cares for his dog. Has someone come by his home 5 days/week to assist with cleaning and other chores ADLs Comments: Pt reports he manages ADLs and IADLs INDly, using RW        OT Problem List: Decreased strength;Impaired balance (sitting and/or standing);Decreased activity tolerance;Decreased safety awareness      OT Treatment/Interventions: Self-care/ADL training;DME and/or AE instruction;Balance  training;Therapeutic activities;Therapeutic exercise;Energy conservation;Patient/family education    OT Goals(Current goals can be found in the care plan section) Acute Rehab OT Goals Patient Stated Goal: to go home OT Goal Formulation: With patient Time For Goal Achievement: 06/23/21 Potential to Achieve Goals: Good ADL Goals Pt Will Transfer to Toilet: with modified independence;regular height toilet (using LRAD) Pt/caregiver will Perform Home Exercise Program: Increased ROM;Increased strength;Independently (to increase strenght and endurance) Additional ADL Goal #1: Pt will identify/demonstrate 2+ falls prevention strategies  OT Frequency: Min 2X/week   Barriers to D/C: Decreased caregiver support  Pt living alone, reports 3 falls in previous 6 months       Co-evaluation              AM-PAC OT "6 Clicks" Daily Activity     Outcome Measure Help from another person eating meals?: None Help from another person taking care of personal grooming?: A Little Help from another person toileting, which includes using toliet, bedpan, or urinal?: A Little Help from another person bathing (including washing, rinsing, drying)?: A Little Help from another person to put on and taking off regular upper body clothing?: A Little Help from another  person to put on and taking off regular lower body clothing?: A Little 6 Click Score: 19   End of Session Equipment Utilized During Treatment: Rolling walker (2 wheels)  Activity Tolerance: Patient tolerated treatment well Patient left: in chair;with call bell/phone within reach;with nursing/sitter in room;with chair alarm set  OT Visit Diagnosis: Unsteadiness on feet (R26.81);Muscle weakness (generalized) (M62.81);History of falling (Z91.81)                Time: 2831-5176 OT Time Calculation (min): 30 min Charges:  OT General Charges $OT Visit: 1 Visit OT Evaluation $OT Eval Moderate Complexity: 1 Mod OT Treatments $Self Care/Home Management  : 23-37 mins Josiah Lobo, PhD, MS, OTR/L 06/09/21, 10:58 AM

## 2021-06-09 NOTE — H&P (View-Only) (Signed)
Progress Note  Patient Name: Brandon Marks Date of Encounter: 06/09/2021  Primary Cardiologist: Sanda Klein, MD  Subjective  Denies any chest pain or shortness of breath today.  No complaints overnight.    Inpatient Medications    Scheduled Meds:  aspirin  81 mg Oral Daily   atorvastatin  40 mg Oral Daily   Chlorhexidine Gluconate Cloth  6 each Topical Q0600   metoprolol tartrate  12.5 mg Oral Q6H   sodium chloride flush  3 mL Intravenous Q12H   Continuous Infusions:   ceFAZolin (ANCEF) IV 2 g (06/09/21 0541)   heparin 2,100 Units/hr (06/08/21 2243)   PRN Meds: acetaminophen, docusate sodium, ondansetron (ZOFRAN) IV, polyethylene glycol   Vital Signs    Vitals:   06/09/21 0000 06/09/21 0354 06/09/21 0500 06/09/21 0751  BP:  130/65  (!) 137/97  Pulse:  87  91  Resp: 20 18  20   Temp:  98.4 F (36.9 C)  98.7 F (37.1 C)  TempSrc:      SpO2:  98%  97%  Weight:   110.6 kg   Height:        Intake/Output Summary (Last 24 hours) at 06/09/2021 0836 Last data filed at 06/09/2021 2694 Gross per 24 hour  Intake 747.94 ml  Output 1100 ml  Net -352.06 ml    Filed Weights   06/06/21 0355 06/08/21 1807 06/09/21 0500  Weight: 109.5 kg 110.1 kg 110.6 kg    Physical Exam   GEN: Well nourished, well developed in no acute distress HEENT: Normal NECK: No JVD; No carotid bruits LYMPHATICS: No lymphadenopathy CARDIAC:RRR, no murmurs, rubs, gallops RESPIRATORY:  Clear to auscultation without rales, wheezing or rhonchi  ABDOMEN: Soft, non-tender, non-distended MUSCULOSKELETAL:  No edema; No deformity  SKIN: Warm and dry NEUROLOGIC:  Alert and oriented x 3 PSYCHIATRIC:  Normal affect   Labs    Chemistry Recent Labs  Lab 06/05/21 1350 06/05/21 1811 06/07/21 0414 06/08/21 0639 06/09/21 0615  NA 136   < > 137 138 137  K 3.7   < > 3.8 3.7 3.9  CL 105   < > 109 105 105  CO2 17*   < > 21* 26 25  GLUCOSE 90   < > 90 78 90  BUN 39*   < > 66* 57* 44*   CREATININE 2.47*   < > 2.42* 1.59* 1.10  CALCIUM 8.4*   < > 7.9* 8.1* 7.9*  PROT 5.9*  --   --   --   --   ALBUMIN 3.1*  --   --   --   --   AST 36  --   --   --   --   ALT 16  --   --   --   --   ALKPHOS 114  --   --   --   --   BILITOT 1.6*  --   --   --   --   GFRNONAA 28*   < > 29* 47* >60  ANIONGAP 14   < > 7 7 7    < > = values in this interval not displayed.      Hematology Recent Labs  Lab 06/07/21 0414 06/08/21 0639 06/09/21 0615  WBC 24.6* 15.2* 14.3*  RBC 4.36 4.55 4.78  HGB 13.0 14.2 14.3  HCT 37.9* 40.2 41.4  MCV 86.9 88.4 86.6  MCH 29.8 31.2 29.9  MCHC 34.3 35.3 34.5  RDW 14.4 14.8 14.6  PLT 77*  95* 114*     Cardiac Enzymes  Recent Labs  Lab 06/05/21 1350 06/05/21 1552  TROPONINIHS 109* 102*       BNP Recent Labs  Lab 06/05/21 1350  BNP 738.0*     Lipids  Lab Results  Component Value Date   CHOL 99 (L) 12/27/2018   HDL 49 12/27/2018   LDLCALC 41 12/27/2018   TRIG 43 12/27/2018   CHOLHDL 2.0 12/27/2018    Radiology    CT ABDOMEN PELVIS WO CONTRAST  Result Date: 06/05/2021 CLINICAL DATA:  Abdominal pain, acute, nonlocalized. Heart failure. Anticoagulated. EXAM: CT ABDOMEN AND PELVIS WITHOUT CONTRAST TECHNIQUE: Multidetector CT imaging of the abdomen and pelvis was performed following the standard protocol without IV contrast. COMPARISON:  01/01/2018 FINDINGS: Lower chest: Mild atelectasis or scarring in both lung bases. Mild cardiomegaly. Hepatobiliary: Liver parenchyma appears normal without contrast. The gallbladder is somewhat distended. No calcified gallstones are seen. Pancreas: Normal Spleen: Normal Adrenals/Urinary Tract: Adrenal glands are prominent, possibly hyperplastic. The right kidney is normal. No cyst, mass, stone or hydronephrosis. There is hydronephrosis of the left kidney with the ureter dilated to the level of the lower pole of the kidney where there is a 13 mm stone causing the obstruction. No stone distal to that. No  stone in the bladder. Stomach/Bowel: Previous gastric bypass. No acute bowel pathology is seen. Vascular/Lymphatic: Aortic atherosclerosis. IVC is normal. No adenopathy. Reproductive: Normal Other: No free fluid or air. Musculoskeletal: Chronic spinal degenerative changes with solid bridging osteophytes suggesting diffuse idiopathic skeletal hyperostosis. IMPRESSION: Acute hydronephrosis on the left. 13 mm stone in the proximal left ureter causing the obstruction. Previous gastric bypass. Aortic Atherosclerosis (ICD10-I70.0). Electronically Signed   By: Bochenek Chimes M.D.   On: 06/05/2021 20:31   CT HEAD WO CONTRAST (5MM)  Result Date: 06/05/2021 CLINICAL DATA:  Mental status changes. Heart failure. History of acoustic neuroma. EXAM: CT HEAD WITHOUT CONTRAST TECHNIQUE: Contiguous axial images were obtained from the base of the skull through the vertex without intravenous contrast. COMPARISON:  MRI 12/17/2020.  CT 10/27/2019. 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 Fat packing along the surgical approach. No CT evidence of residual mass. MRI would be more sensitive. Cerebral hemispheres do not show any acute or focal finding otherwise. There is ventriculomegaly, new when compared to CT scan of April 2021 but similar to the MRI in May of this year. Vascular: No abnormal vascular finding. 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 No evidence of residual or recurrent mass. Hydrocephalus, similar to the study of 12/17/2020 and newly seen since April of 2021. This could be significant. Has this been considered? Electronically Signed   By: Onken Chimes M.D.   On: 06/05/2021 20:36   DG Chest Portable 1 View  Result Date: 06/05/2021 CLINICAL DATA:  Weakness. EXAM: PORTABLE CHEST 1 VIEW COMPARISON:  January 08, 2018. FINDINGS: Stable cardiomegaly. Both  lungs are clear. The visualized skeletal structures are unremarkable. IMPRESSION: No active disease. Electronically Signed   By: Marijo Conception M.D.   On: 06/05/2021 14:28   DG OR UROLOGY CYSTO IMAGE ([place])  Result Date: 06/05/2021 There is no interpretation for this exam.  This order is for images obtained during a surgical procedure.  Please See "Surgeries" Tab for more information regarding the procedure.   Telemetry    A. fib, mostly in the 90s- Personally Reviewed  Cardiac Studies   Lexiscan Cardiolite 6.2018    12/2016 MV: EF 62%.  Fixed small, mild mid anteroseptal and apical defect without reversibility.  Most likely attenuation.  Low risk study. _____________  2D Echocardiogram 11.11.2022   1. Challenging images.   2. Left ventricular ejection fraction, by estimation, is 30 to 35%. The  left ventricle has moderately decreased function. The left ventricle  demonstrates global hypokinesis. Left ventricular diastolic parameters are  indeterminate.   3. Right ventricular systolic function is mildly reduced. The right  ventricular size is mildly enlarged.   4. Left atrial size was mildly dilated.   5. The mitral valve is normal in structure. No evidence of mitral valve  regurgitation. No evidence of mitral stenosis.   6. The aortic valve was not well visualized. Aortic valve regurgitation  is not visualized. No aortic stenosis is present.   Patient Profile   65 y.o. male with a history of persistent atrial fibrillation, obesity status post gastric bypass, vestibular schwannoma status post resection complicated by CSF leak requiring lumbar punctures/drain, hyperlipidemia, hypertension, and prior low risk stress tests, who was admitted 11/9 w/ sepsis, AKI in the setting of L hydronephrosis/obstructive uropathy/ureteral stone s/p emergent stenting, klebsiella bacteremia, elevated HsTrop, and afib w/ RVR (likely persistent).  Echo w/ EF of 30-35%, glob HK.  Assessment & Plan     1.  Sepsis/Klebsiella bacteremia: In the setting of left ureteral stone with hydronephrosis, obstructive uropathy, and acute kidney injury.  Status post left ureteral stenting.  Antibiotics per IM/ID.   -Serum creatinine continues to improve and is now down to 1.1 from a high of 2.74  2.  Atrial fibrillation with RVR: Likely present in at least the paroxysmal nature dating back to April 2021 and more persistent since June of this year based on ECGs available in Care Everywhere.  Previous seen by [facility] cardiology in September with referral to EP due to unsteady gait, falls, high risk for bleeding, along with multiple recurrent procedures related to CSF leak-?  Benefit of watchman.  Has not seen EP yet.  Admitted November 9 with left ureteral stone and sepsis, along with A. fib with RVR.   -He has been on IV amiodarone though pressures have improved significantly.   -Heart rate is significantly improved with rates in the 90s today after stopping IV amnio yesterday and adding Lopressor 12.5 mg every 6 hours -Consolidate Lopressor to 25 mg twice daily -Eliquis is on hold for cardiac cath tomorrow to evaluate cardiomyopathy which is new -IV heparin per pharmacy protocol  3.  Cardiomyopathy/elevated high-sensitivity troponin: In the setting of above, echocardiogram showed an EF of 30 to 35% with global hypokinesis.   -He appears euvolemic on exam today  -In light of mild troponin elevation, multiple risk factors, LV dysfunction, and requirement for cardiac clearance for future VP shunt at [facility] and lithotripsy, he will require diagnostic catheterization.   -He has been on Eliquis, was discontinued yes30terday and placed on IV heparin per pharmacy -Serum creatinine is much improved and actually normalized today at 1.1 -Continue aspirin 81 mg daily, 25 mg twice daily (will consolidate to Toprol at discharge) and atorvastatin 40 mg daily -Shared Decision Making/Informed Consent The risks [stroke (1 in  1000), death (1 in 1000), kidney failure [usually temporary] (1 in 500), bleeding (1 in 200), allergic reaction [possibly serious] (1 in 200)], benefits (diagnostic support and management of coronary artery disease) and alternatives of a cardiac catheterization were discussed in detail with Mr. Malinoski  and he is willing to proceed.   4.  AKI/obstructive uropathy: Status post left ureteral stenting with plan for outpatient lithotripsy.   -Creatinine continues to improve and is 1.1 this morning.  5.  Hydrocephalus: Pending VP shunt at [facility].  In the setting of above, will require further ischemic evaluation for preoperative cardiovascular evaluation.  I have spent a total of 30 minutes with patient reviewing 2D echo , telemetry, EKGs, labs and examining patient as well as establishing an assessment and plan that was discussed with the patient.  > 50% of time was spent in direct patient care.     Signed, Fransico Him, MD  06/09/2021, 8:36 AM    For questions or updates, please contact   Please consult [email or web address] for contact info under Cardiology/STEMI.

## 2021-06-10 ENCOUNTER — Encounter
Admission: EM | Disposition: A | Discharge status: Home / Self Care | Payer: Self-pay | Source: Home / Self Care | Attending: Internal Medicine

## 2021-06-10 DIAGNOSIS — N132 Hydronephrosis with renal and ureteral calculous obstruction: Secondary | ICD-10-CM | POA: Diagnosis not present

## 2021-06-10 DIAGNOSIS — I5021 Acute systolic (congestive) heart failure: Secondary | ICD-10-CM | POA: Diagnosis not present

## 2021-06-10 DIAGNOSIS — I4891 Unspecified atrial fibrillation: Secondary | ICD-10-CM | POA: Diagnosis not present

## 2021-06-10 DIAGNOSIS — B961 Klebsiella pneumoniae [K. pneumoniae] as the cause of diseases classified elsewhere: Secondary | ICD-10-CM | POA: Diagnosis not present

## 2021-06-10 DIAGNOSIS — I428 Other cardiomyopathies: Secondary | ICD-10-CM | POA: Diagnosis not present

## 2021-06-10 DIAGNOSIS — A419 Sepsis, unspecified organism: Secondary | ICD-10-CM | POA: Diagnosis not present

## 2021-06-10 DIAGNOSIS — N390 Urinary tract infection, site not specified: Secondary | ICD-10-CM | POA: Diagnosis not present

## 2021-06-10 DIAGNOSIS — R7881 Bacteremia: Secondary | ICD-10-CM | POA: Diagnosis not present

## 2021-06-10 HISTORY — PX: LEFT HEART CATH AND CORONARY ANGIOGRAPHY: CATH118249

## 2021-06-10 LAB — BASIC METABOLIC PANEL
Anion gap: 6 (ref 5–15)
BUN: 36 mg/dL — ABNORMAL HIGH (ref 8–23)
CO2: 24 mmol/L (ref 22–32)
Calcium: 8 mg/dL — ABNORMAL LOW (ref 8.9–10.3)
Chloride: 107 mmol/L (ref 98–111)
Creatinine, Ser: 0.9 mg/dL (ref 0.61–1.24)
GFR, Estimated: >60 mL/min (ref >60)
Glucose, Bld: 79 mg/dL (ref 70–99)
Potassium: 4.2 mmol/L (ref 3.5–5.1)
Sodium: 137 mmol/L (ref 135–145)

## 2021-06-10 LAB — HEPARIN LEVEL (UNFRACTIONATED): Heparin Unfractionated: 0.65 IU/mL (ref 0.30–0.70)

## 2021-06-10 LAB — CBC
HCT: 41.5 % (ref 39.0–52.0)
Hemoglobin: 14.4 g/dL (ref 13.0–17.0)
MCH: 30.5 pg (ref 26.0–34.0)
MCHC: 34.7 g/dL (ref 30.0–36.0)
MCV: 87.9 fL (ref 80.0–100.0)
Platelets: 150 10*3/uL (ref 150–400)
RBC: 4.72 MIL/uL (ref 4.22–5.81)
RDW: 14.6 % (ref 11.5–15.5)
WBC: 14.6 10*3/uL — ABNORMAL HIGH (ref 4.0–10.5)
nRBC: 0 % (ref 0.0–0.2)

## 2021-06-10 LAB — APTT
aPTT: 105 seconds — ABNORMAL HIGH (ref 24–36)
aPTT: 84 seconds — ABNORMAL HIGH (ref 24–36)

## 2021-06-10 SURGERY — LEFT HEART CATH AND CORONARY ANGIOGRAPHY
Anesthesia: Moderate Sedation

## 2021-06-10 MED ORDER — VERAPAMIL HCL 2.5 MG/ML IV SOLN
INTRAVENOUS | Status: AC
  Filled 2021-06-10: qty 2

## 2021-06-10 MED ORDER — HEPARIN SODIUM (PORCINE) 1000 UNIT/ML IJ SOLN
INTRAMUSCULAR | Status: DC | PRN
  Administered 2021-06-10: 5000 [IU] via INTRAVENOUS

## 2021-06-10 MED ORDER — HEPARIN SODIUM (PORCINE) 1000 UNIT/ML IJ SOLN
INTRAMUSCULAR | Status: AC
  Filled 2021-06-10: qty 1

## 2021-06-10 MED ORDER — FENTANYL CITRATE (PF) 100 MCG/2ML IJ SOLN
INTRAMUSCULAR | Status: AC
  Filled 2021-06-10: qty 2

## 2021-06-10 MED ORDER — ASPIRIN 81 MG PO CHEW: 81.0000 mg | CHEWABLE_TABLET | ORAL | Status: DC

## 2021-06-10 MED ORDER — APIXABAN 5 MG PO TABS
5.0000 mg | ORAL_TABLET | Freq: Two times a day (BID) | ORAL | Status: DC
  Administered 2021-06-10 – 2021-06-13 (×6): 5 mg via ORAL
  Filled 2021-06-10 (×7): qty 1

## 2021-06-10 MED ORDER — LIDOCAINE HCL 1 % IJ SOLN
INTRAMUSCULAR | Status: AC
  Filled 2021-06-10: qty 20

## 2021-06-10 MED ORDER — MIDAZOLAM HCL 2 MG/2ML IJ SOLN
INTRAMUSCULAR | Status: AC
  Filled 2021-06-10: qty 2

## 2021-06-10 MED ORDER — SODIUM CHLORIDE 0.9% FLUSH: 3.0000 mL | INTRAVENOUS | Status: DC | PRN

## 2021-06-10 MED ORDER — IOHEXOL 350 MG/ML SOLN
INTRAVENOUS | Status: DC | PRN
  Administered 2021-06-10: 33 mL

## 2021-06-10 MED ORDER — HEPARIN (PORCINE) IN NACL 1000-0.9 UT/500ML-% IV SOLN
INTRAVENOUS | Status: DC | PRN
  Administered 2021-06-10: 1000 mL

## 2021-06-10 MED ORDER — HEPARIN (PORCINE) IN NACL 1000-0.9 UT/500ML-% IV SOLN
INTRAVENOUS | Status: AC
  Filled 2021-06-10: qty 1000

## 2021-06-10 MED ORDER — VERAPAMIL HCL 2.5 MG/ML IV SOLN
INTRAVENOUS | Status: DC | PRN
  Administered 2021-06-10: 2.5 mg via INTRA_ARTERIAL

## 2021-06-10 MED ORDER — SODIUM CHLORIDE 0.9 % IV SOLN: 250.0000 mL | INTRAVENOUS | Status: DC | PRN

## 2021-06-10 MED ORDER — SODIUM CHLORIDE 0.9% FLUSH
3.0000 mL | Freq: Two times a day (BID) | INTRAVENOUS | Status: DC
  Administered 2021-06-10 – 2021-06-12 (×4): 3 mL via INTRAVENOUS

## 2021-06-10 MED ORDER — SODIUM CHLORIDE 0.9 % IV SOLN: INTRAVENOUS | Status: AC

## 2021-06-10 MED ORDER — SODIUM CHLORIDE 0.9 % IV SOLN
250.0000 mL | INTRAVENOUS | Status: DC | PRN
  Administered 2021-06-10 – 2021-06-11 (×2): 1000 mL via INTRAVENOUS

## 2021-06-10 MED ORDER — LIDOCAINE HCL (PF) 1 % IJ SOLN
INTRAMUSCULAR | Status: DC | PRN
  Administered 2021-06-10: 2 mL

## 2021-06-10 MED ORDER — MIDAZOLAM HCL 2 MG/2ML IJ SOLN
INTRAMUSCULAR | Status: DC | PRN
  Administered 2021-06-10: 1 mg via INTRAVENOUS

## 2021-06-10 MED ORDER — SODIUM CHLORIDE 0.9 % IV SOLN: INTRAVENOUS | Status: DC

## 2021-06-10 MED ORDER — FENTANYL CITRATE (PF) 100 MCG/2ML IJ SOLN
INTRAMUSCULAR | Status: DC | PRN
  Administered 2021-06-10: 25 ug via INTRAVENOUS

## 2021-06-10 SURGICAL SUPPLY — 11 items
CATH INFINITI 5FR JK (CATHETERS) ×3 IMPLANT
DEVICE RAD TR BAND REGULAR (VASCULAR PRODUCTS) ×3 IMPLANT
DRAPE BRACHIAL (DRAPES) ×3 IMPLANT
GLIDESHEATH SLEND SS 6F .021 (SHEATH) ×3 IMPLANT
GUIDEWIRE INQWIRE 1.5J.035X260 (WIRE) ×1 IMPLANT
INQWIRE 1.5J .035X260CM (WIRE) ×3
KIT SYRINGE INJ CVI SPIKEX1 (MISCELLANEOUS) ×3 IMPLANT
PACK CARDIAC CATH (CUSTOM PROCEDURE TRAY) ×3 IMPLANT
PROTECTION STATION PRESSURIZED (MISCELLANEOUS) ×3
SET ATX SIMPLICITY (MISCELLANEOUS) ×3 IMPLANT
STATION PROTECTION PRESSURIZED (MISCELLANEOUS) ×1 IMPLANT

## 2021-06-10 NOTE — Progress Notes (Signed)
   Date of Admission:  06/05/2021    ID: Brandon Marks is a 65 y.o. male Principal Problem:   Sepsis secondary to UTI [facility]) Active Problems:   Hyperlipidemia   Essential hypertension   Obesity (BMI 30-39.9)   Severe sepsis with septic shock (HCC)   Left ureteral calculus   Atrial fibrillation with rapid ventricular response (HCC)   Acute systolic heart failure (HCC)    Subjective: Pt underwent cardiac cath today and no lesions Pt doing better No fever Medications:   apixaban  5 mg Oral BID   atorvastatin  40 mg Oral Daily   metoprolol tartrate  25 mg Oral BID   sodium chloride flush  3 mL Intravenous Q12H   sodium chloride flush  3 mL Intravenous Q12H    Objective: Vital signs in last 24 hours: Temp:  [97.3 F (36.3 C)-98.7 F (37.1 C)] 98 F (36.7 C) (11/14 1638) Pulse Rate:  [85-110] 96 (11/14 1638) Resp:  [12-20] 18 (11/14 1638) BP: (111-157)/(65-138) 121/75 (11/14 1638) SpO2:  [95 %-100 %] 100 % (11/14 1638) Weight:  [111.4 kg] 111.4 kg (11/14 0205)  PHYSICAL EXAM:  General: Alert, cooperative, no distress, appears stated age.  Lungs: Clear to auscultation bilaterally. No Wheezing or Rhonchi. No rales. Heart: irregular. Abdomen: Soft, non-tender,not distended. Bowel sounds normal. No masses Extremities: atraumatic, no cyanosis. No edema. No clubbing Skin: No rashes or lesions. Or bruising Lymph: Cervical, supraclavicular normal. Neurologic: Grossly non-focal  Lab Results Recent Labs    06/09/21 0615 06/10/21 0359  WBC 14.3* 14.6*  HGB 14.3 14.4  HCT 41.4 41.5  NA 137 137  K 3.9 4.2  CL 105 107  CO2 25 24  BUN 44* 36*  CREATININE 1.10 0.90   Liver Panel No results for input(s): PROT, ALBUMIN, AST, ALT, ALKPHOS, BILITOT, BILIDIR, IBILI in the last 72 hours. Microbiology:  Studies/Results: CARDIAC CATHETERIZATION  Result Date: 06/10/2021   There is moderate left ventricular systolic dysfunction.   LV end diastolic pressure is normal.   The  left ventricular ejection fraction is 35-45% by visual estimate. 1.  Minimal luminal irregularities with no evidence of obstructive coronary artery disease. 2.  Moderately reduced LV systolic function with global hypokinesis.  Normal left ventricular end-diastolic pressure. Recommendations: The patient has nonischemic cardiomyopathy.  Recommend medical therapy. Eliquis can be resumed later tonight.     Assessment/Plan: ?Septic shock due to klebsiella bacteremia and complicated UTI Pt was on ceftriaxone - the weekend hospitalist switched to cefazolin- watch closely   Left hydroueteronephrosis due to PUJ obstruction with stone S/p stent placement Leucocytosis improving Will get [place] to make sure the hydronephrosis has resolved Will need 2 weeks of antibiotic total and would consider stone removal while on antibiotics   Afib rate controlled Increase in troponin due to demand ischemia Followed by Cards   AKI - has resolved     Ventriculomegaly- with NPH followed at [facility]- Shunt postponed due to afib   H/o rt sided schwannoma resection complicated by CSF leak and repair June 2021   H/o gastric bypass  Discussed the management with patient.

## 2021-06-10 NOTE — TOC Initial Note (Signed)
Transition of Care [facility]) - Initial/Assessment Note    Patient Details  Name: Brandon Marks MRN: [id number] Date of Birth: [birth year]  Transition of Care [facility]) CM/SW Contact:    Eileen Stanford, LCSW Phone Number: 06/10/2021, 1:03 PM  Clinical Narrative:     CSW spoke with and pt is agreeable to SNF. Pt states he has not been to SNF in the past. Pt I agreeable to CSW sending referral to [facility] facilities.  CSW will provide bed offers once available.             Expected Discharge Plan: Skilled Nursing Facility Barriers to Discharge: Continued Medical Work up   Patient Goals and CMS Choice Patient states their goals for this hospitalization and ongoing recovery are:: to get better   Choice offered to / list presented to : Patient  Expected Discharge Plan and Services Expected Discharge Plan: [place] In-house Referral: NA   Post Acute Care Choice: [place] Living arrangements for the past 2 months: Single Family Home                                      Prior Living Arrangements/Services Living arrangements for the past 2 months: Single Family Home Lives with:: Roommate Patient language and need for interpreter reviewed:: Yes Do you feel safe going back to the place where you live?: Yes      Need for Family Participation in Patient Care: Yes (Comment) Care giver support system in place?: Yes (comment)   Criminal Activity/Legal Involvement Pertinent to Current Situation/Hospitalization: No - Comment as needed  Activities of Daily Living      Permission Sought/Granted Permission sought to share information with : Family Supports                Emotional Assessment Appearance:: Appears stated age Attitude/Demeanor/Rapport: Engaged Affect (typically observed): Accepting Orientation: : Oriented to Self, Oriented to Place, Oriented to  Time, Oriented to Situation Alcohol / Substance Use: Not Applicable Psych  Involvement: No (comment)  Admission diagnosis:  Cardiogenic shock ([place]) [R57.0] Abdominal distension [R14.0] Atrial fibrillation with rapid ventricular response (HCC) [I48.91] Patient Active Problem List   Diagnosis Date Noted   Atrial fibrillation with RVR ([place]) 06/08/2021   Sepsis secondary to UTI ([place]) 06/08/2021   Severe sepsis with septic shock ([place]) 06/06/2021   Left ureteral calculus 06/06/2021   Cardiogenic shock ([place]) 06/05/2021   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[place]) 02/22/2021   23-polyvalent pneumococcal polysaccharide vaccine declined 02/22/2021   Benign neoplasm of brain, unspecified brain region [facility]) 11/15/2020   Status post excision of acoustic neuroma 02/07/2020   S/P right TKA 09/13/2019   S/P left TKA 08/11/2019   Genu varum of both lower extremities 06/13/2019   Vitamin B12 deficiency 06/13/2019   Obesity (BMI 30-39.9) 12/27/2018   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   Tinnitus of right ear 12/27/2018   Gait disturbance 12/27/2018   Normochromic anemia 12/27/2018   Primary osteoarthritis of both knees 05/24/2535   Diastolic heart failure ([place]) 09/11/2017   DDD (degenerative disc disease), cervical 09/04/2017   Dyspnea, chronic DOE 02/27/2012   Herniated disc    Hyperlipidemia 64/40/3474   Diastolic dysfunction, left ventricle 02/23/2009   Essential hypertension 10/18/2007   OSA on CPAP 10/11/2007   PCP:  Ladell Pier, MD Pharmacy:   [facility] [place], [place]  [place] [address] Phone: [phone] Fax: [phone]  [place], [place] [address] Phone: [phone] Fax: [phone]  [place], [place] [address] Phone: [phone] Fax: [phone]     Social Determinants of Health  (SDOH) Interventions    Readmission Risk Interventions No flowsheet data found.

## 2021-06-10 NOTE — Interval H&P Note (Signed)
History and Physical Interval Note:  06/10/2021 2:06 PM  Brandon Marks  has presented today for surgery, with the diagnosis of cardiomyopathy.  The various methods of treatment have been discussed with the patient and family. After consideration of risks, benefits and other options for treatment, the patient has consented to  Procedure(s): LEFT HEART CATH AND CORONARY ANGIOGRAPHY (N/A) as a surgical intervention.  [name] history has been reviewed, patient examined, no change in status, stable for surgery.  I have reviewed the patient's chart and labs.  Questions were answered to the patient's satisfaction.     [name]

## 2021-06-10 NOTE — Consult Note (Addendum)
ANTICOAGULATION CONSULT NOTE -   Pharmacy Consult for heparin  Indication: atrial fibrillation  Allergies  Allergen Reactions   Codeine Nausea And Vomiting   Oxycodone Other (See Comments)    Upset GI    Patient Measurements: Height: 6\' 2"  (188 cm) Weight: 111.4 kg (245 lb 9.5 oz) IBW/kg (Calculated) : 82.2 Heparin Dosing Weight: 104.8kg  Vital Signs: Temp: 97.3 F (36.3 C) (11/14 1119) BP: 124/88 (11/14 1119) Pulse Rate: 98 (11/14 1119)  Labs: Recent Labs    06/08/21 0639 06/08/21 0837 06/08/21 1452 06/09/21 0615 06/09/21 1234 06/10/21 0359 06/10/21 1151  HGB 14.2  --   --  14.3  --  14.4  --   HCT 40.2  --   --  41.4  --  41.5  --   PLT 95*  --   --  114*  --  150  --   APTT  --  32   < > 76* 71* 105* 84*  HEPARINUNFRC  --  >1.10*  --  1.00*  --  0.65  --   CREATININE 1.59*  --   --  1.10  --  0.90  --    < > = values in this interval not displayed.     Estimated Creatinine Clearance: 105.8 mL/min (by C-G formula based on SCr of 0.9 mg/dL).   Medical History: Past Medical History:  Diagnosis Date   Arthritis    CSF leak    a. 12/2019 following resection of vestibular schwannoma; b.  12/2020 status post lumbar puncture for ventriculomegaly; e.  01/2021: Status post lumbar drain ([facility]).  Pending VP shunt.   Herniated disc    High cholesterol    History of stress test    a. 09/2007 MV: EF 63%, small area of anterolateral and apical reversibility; b.  12/2016 MV: EF 62%.  Fixed small, mild mid anteroseptal and apical defect without reversibility.  Most likely attenuation.  Low risk study.   Hypertension    Morbid obesity ([place])    PAC (premature atrial contraction)    Persistent atrial fibrillation ([place])    a. first noted on 12 lead ECG 10/2019.   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 12/27/2019 s/p resection ([facility]). Post-op course complicated by CSF leak/seizures req repair 01/06/2020 and high volume LP on 01/16/2020.     Medications:  Scheduled:   [START ON 06/11/2021] aspirin  81 mg Oral Pre-Cath   atorvastatin  40 mg Oral Daily   metoprolol tartrate  25 mg Oral BID   sodium chloride flush  3 mL Intravenous Q12H  Heparin Dosing Weight: 104.8kg  Assessment: 65yo male with PMH of HTN, dCHF, OSA (on CPAP), HLD, atrial fibrillation on Eliquis who was admitted to the hospital for rigors and chills. Patient is experiencing increase in acute encephalopathy and was transitioned form Eliquis to heparin infusion. Pharmacy was consulted for heparin infusion. Pt with new thrombocytopenia prior to heparin starting.    Last dose of Eliquis: 11/11 2157  11/12 1452 aPTT 42. HL (@0837 ) > 1.1 11/12 2145 aPTT 54  subtherapeutic inc to 2100 u/hr 11/13 0615 aPTT 76  therap x 1 11/13 1234 aPTT 71  therap x 2  11/14 0359 aPTT 105 (SUPRAtherapeutic) ,  HL 0.65 (therapeutic); rate 2100 >2000 un/hr 11/14 1151 aPTT 84s Thera x1; rate 2000 un/hr  Hgb WNL/stable; Plts 95>114>150 improving & now WNL   Goal of Therapy:  aPTT 66-102 seconds Monitor  platelets by anticoagulation protocol: Yes   Plan:  11/14 1151 aPTT 84s Thera x1; last aPTT and heparin very nearly correlating. Anticipate change over to HL tomorrow AM. Planning for [facility] today 11/14. Will continue current heparin infusion rate at 2000 units/hr  Recheck aPTT in 6 hrs to confirm rate (11/14 1800).  Next HL on 11/15 with AM labs; then by HL alone once correlating CTM daily CBC while on heparin gtt.  Lorna Dibble, PharmD, [facility] Clinical Pharmacist 06/10/2021 12:32 PM

## 2021-06-10 NOTE — Progress Notes (Signed)
Progress Note  Patient Name: Brandon Marks Date of Encounter: 06/10/2021  Primary Cardiologist: Sanda Klein, MD  Subjective   The patient denies any chest pain or shortness of breath.  He had left heart catheterization done which showed minimal irregularities with no evidence of coronary artery disease.  Inpatient Medications    Scheduled Meds:  apixaban  5 mg Oral BID   [START ON 06/11/2021] aspirin  81 mg Oral Pre-Cath   [MAR Hold] atorvastatin  40 mg Oral Daily   [MAR Hold] metoprolol tartrate  25 mg Oral BID   [MAR Hold] sodium chloride flush  3 mL Intravenous Q12H   Continuous Infusions:  sodium chloride     [START ON 06/11/2021] sodium chloride 10 mL/hr at 06/10/21 1333   [MAR Hold]  ceFAZolin (ANCEF) IV 2 g (06/10/21 0559)   heparin Stopped (06/10/21 1333)   PRN Meds: sodium chloride, [MAR Hold] acetaminophen, [MAR Hold] docusate sodium, fentaNYL, Heparin (Porcine) in NaCl, heparin sodium (porcine), iohexol, lidocaine (PF), midazolam, [MAR Hold] ondansetron (ZOFRAN) IV, [MAR Hold] polyethylene glycol, sodium chloride flush, verapamil   Vital Signs    Vitals:   06/10/21 1119 06/10/21 1329 06/10/21 1432 06/10/21 1445  BP: 124/88 128/86 125/83 (!) 139/93  Pulse: 98 92 85 93  Resp: 17 15 14 12   Temp: (!) 97.3 F (36.3 C) 97.9 F (36.6 C)    TempSrc:  Axillary    SpO2: 98% 96% 97% 97%  Weight:      Height:        Intake/Output Summary (Last 24 hours) at 06/10/2021 1448 Last data filed at 06/10/2021 0558 Gross per 24 hour  Intake 978.45 ml  Output 690 ml  Net 288.45 ml    Filed Weights   06/08/21 1807 06/09/21 0500 06/10/21 0205  Weight: 110.1 kg 110.6 kg 111.4 kg    Physical Exam   GEN: Well nourished, well developed in no acute distress HEENT: Normal NECK: No JVD; No carotid bruits LYMPHATICS: No lymphadenopathy CARDIAC:RRR, no murmurs, rubs, gallops RESPIRATORY:  Clear to auscultation without rales, wheezing or rhonchi  ABDOMEN: Soft,  non-tender, non-distended MUSCULOSKELETAL:  No edema; No deformity  SKIN: Warm and dry NEUROLOGIC:  Alert and oriented x 3 PSYCHIATRIC:  Normal affect   Labs    Chemistry Recent Labs  Lab 06/05/21 1350 06/05/21 1811 06/08/21 0639 06/09/21 0615 06/10/21 0359  NA 136   < > 138 137 137  K 3.7   < > 3.7 3.9 4.2  CL 105   < > 105 105 107  CO2 17*   < > 26 25 24   GLUCOSE 90   < > 78 90 79  BUN 39*   < > 57* 44* 36*  CREATININE 2.47*   < > 1.59* 1.10 0.90  CALCIUM 8.4*   < > 8.1* 7.9* 8.0*  PROT 5.9*  --   --   --   --   ALBUMIN 3.1*  --   --   --   --   AST 36  --   --   --   --   ALT 16  --   --   --   --   ALKPHOS 114  --   --   --   --   BILITOT 1.6*  --   --   --   --   GFRNONAA 28*   < > 47* >60 >60  ANIONGAP 14   < > 7 7 6    < > =  values in this interval not displayed.      Hematology Recent Labs  Lab 06/08/21 0639 06/09/21 0615 06/10/21 0359  WBC 15.2* 14.3* 14.6*  RBC 4.55 4.78 4.72  HGB 14.2 14.3 14.4  HCT 40.2 41.4 41.5  MCV 88.4 86.6 87.9  MCH 31.2 29.9 30.5  MCHC 35.3 34.5 34.7  RDW 14.8 14.6 14.6  PLT 95* 114* 150     Cardiac Enzymes  Recent Labs  Lab 06/05/21 1350 06/05/21 1552  TROPONINIHS 109* 102*       BNP Recent Labs  Lab 06/05/21 1350  BNP 738.0*     Lipids  Lab Results  Component Value Date   CHOL 99 (L) 12/27/2018   HDL 49 12/27/2018   LDLCALC 41 12/27/2018   TRIG 43 12/27/2018   CHOLHDL 2.0 12/27/2018    Radiology    CT ABDOMEN PELVIS WO CONTRAST  Result Date: 06/05/2021 CLINICAL DATA:  Abdominal pain, acute, nonlocalized. Heart failure. Anticoagulated. EXAM: CT ABDOMEN AND PELVIS WITHOUT CONTRAST TECHNIQUE: Multidetector CT imaging of the abdomen and pelvis was performed following the standard protocol without IV contrast. COMPARISON:  01/01/2018 FINDINGS: Lower chest: Mild atelectasis or scarring in both lung bases. Mild cardiomegaly. Hepatobiliary: Liver parenchyma appears normal without contrast. The gallbladder  is somewhat distended. No calcified gallstones are seen. Pancreas: Normal Spleen: Normal Adrenals/Urinary Tract: Adrenal glands are prominent, possibly hyperplastic. The right kidney is normal. No cyst, mass, stone or hydronephrosis. There is hydronephrosis of the left kidney with the ureter dilated to the level of the lower pole of the kidney where there is a 13 mm stone causing the obstruction. No stone distal to that. No stone in the bladder. Stomach/Bowel: Previous gastric bypass. No acute bowel pathology is seen. Vascular/Lymphatic: Aortic atherosclerosis. IVC is normal. No adenopathy. Reproductive: Normal Other: No free fluid or air. Musculoskeletal: Chronic spinal degenerative changes with solid bridging osteophytes suggesting diffuse idiopathic skeletal hyperostosis. IMPRESSION: Acute hydronephrosis on the left. 13 mm stone in the proximal left ureter causing the obstruction. Previous gastric bypass. Aortic Atherosclerosis (ICD10-I70.0). Electronically Signed   By: Barkow Chimes M.D.   On: 06/05/2021 20:31   CT HEAD WO CONTRAST (5MM)  Result Date: 06/05/2021 CLINICAL DATA:  Mental status changes. Heart failure. History of acoustic neuroma. EXAM: CT HEAD WITHOUT CONTRAST TECHNIQUE: Contiguous axial images were obtained from the base of the skull through the vertex without intravenous contrast. COMPARISON:  MRI 12/17/2020.  CT 10/27/2019. 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 Fat packing along the surgical approach. No CT evidence of residual mass. MRI would be more sensitive. Cerebral hemispheres do not show any acute or focal finding otherwise. There is ventriculomegaly, new when compared to CT scan of April 2021 but similar to the MRI in May of this year. Vascular: No abnormal vascular finding. 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  No evidence of residual or recurrent mass. Hydrocephalus, similar to the study of 12/17/2020 and newly seen since April of 2021. This could be significant. Has this been considered? Electronically Signed   By: Northrop Chimes M.D.   On: 06/05/2021 20:36   DG Chest Portable 1 View  Result Date: 06/05/2021 CLINICAL DATA:  Weakness. EXAM: PORTABLE CHEST 1 VIEW COMPARISON:  January 08, 2018. FINDINGS: Stable cardiomegaly. Both lungs are clear. The visualized skeletal structures are unremarkable. IMPRESSION: No active disease. Electronically Signed   By: Jeneen Rinks  Murlean Caller M.D.   On: 06/05/2021 14:28   DG OR UROLOGY CYSTO IMAGE ([place])  Result Date: 06/05/2021 There is no interpretation for this exam.  This order is for images obtained during a surgical procedure.  Please See "Surgeries" Tab for more information regarding the procedure.   Telemetry    A. fib, mostly in the 90s- Personally Reviewed  Cardiac Studies   Lexiscan Cardiolite 6.2018    12/2016 MV: EF 62%.  Fixed small, mild mid anteroseptal and apical defect without reversibility.  Most likely attenuation.  Low risk study. _____________  2D Echocardiogram 11.11.2022   1. Challenging images.   2. Left ventricular ejection fraction, by estimation, is 30 to 35%. The  left ventricle has moderately decreased function. The left ventricle  demonstrates global hypokinesis. Left ventricular diastolic parameters are  indeterminate.   3. Right ventricular systolic function is mildly reduced. The right  ventricular size is mildly enlarged.   4. Left atrial size was mildly dilated.   5. The mitral valve is normal in structure. No evidence of mitral valve  regurgitation. No evidence of mitral stenosis.   6. The aortic valve was not well visualized. Aortic valve regurgitation  is not visualized. No aortic stenosis is present.   Patient Profile   65 y.o. male with a history of persistent atrial fibrillation, obesity status post gastric bypass,  vestibular schwannoma status post resection complicated by CSF leak requiring lumbar punctures/drain, hyperlipidemia, hypertension, and prior low risk stress tests, who was admitted 11/9 w/ sepsis, AKI in the setting of L hydronephrosis/obstructive uropathy/ureteral stone s/p emergent stenting, klebsiella bacteremia, elevated HsTrop, and afib w/ RVR (likely persistent).  Echo w/ EF of 30-35%, glob HK.  Assessment & Plan    1.  Sepsis/Klebsiella bacteremia: In the setting of left ureteral stone with hydronephrosis, obstructive uropathy, and acute kidney injury.  Status post left ureteral stenting.  Antibiotics per IM/ID.   -   2.  Atrial fibrillation with RVR: Likely present in at least the paroxysmal nature dating back to April 2021 and more persistent since June of this year based on ECGs available in Care Everywhere.  Previous seen by [facility] cardiology in September with referral to EP due to unsteady gait, falls, high risk for bleeding, along with multiple recurrent procedures related to CSF leak-?  Benefit of watchman.  Has not seen EP yet.  Admitted November 9 with left ureteral stone and sepsis, along with A. fib with RVR.   -He was treated with IV amiodarone but rate became controlled with metoprolol 25 mg twice daily.  The dose should be increased as tolerated by blood pressure. -I discontinued heparin drip and will resumed Eliquis 5 mg twice daily starting tonight -The patient might benefit from attempted cardioversion in few weeks although he reports needing to have shunt surgery done at [facility] which will require interruption of Eliquis.  3.  Cardiomyopathy/elevated high-sensitivity troponin: In the setting of above, echocardiogram showed an EF of 30 to 35% with global hypokinesis.   -He appears euvolemic on exam today  Left heart catheterization was done today which showed minimal irregularities with no evidence of obstructive disease.  The patient has nonischemic cardiomyopathy could be in the  setting of acute illness and suboptimal rate control. Recommend switching metoprolol to Toprol before hospital discharge. Blood pressure is on the low side and does not allow the addition of ACE inhibitor or ARB.    4.  AKI/obstructive uropathy: Status post left ureteral stenting with plan for  outpatient lithotripsy.   Renal function is back to baseline.  5.  Hydrocephalus: Pending VP shunt at [facility].     Signed, [name], MD  06/10/2021, 2:48 PM    For questions or updates, please contact   Please consult [email or web address] for contact info under Cardiology/STEMI.

## 2021-06-10 NOTE — Care Management Important Message (Signed)
Important Message  Patient Details  Name: Brandon Marks MRN: [id number] Date of Birth: February 07, 1956   Medicare Important Message Given:        Brandon Marks 06/10/2021, 3:01 PM

## 2021-06-10 NOTE — Progress Notes (Signed)
PT Cancellation Note  Patient Details Name: AMANTE FOMBY MRN: [id number] DOB: [birth year]   Cancelled Treatment:    Reason Eval/Treat Not Completed: Patient at procedure or test/unavailable.  Pt currently off floor for procedure (per chart plan for cath lab procedure).  Will re-attempt PT session at a later date/time as medically appropriate.  Leitha Bleak, PT 06/10/21, 2:09 PM

## 2021-06-10 NOTE — Consult Note (Signed)
ANTICOAGULATION CONSULT NOTE -   Pharmacy Consult for heparin  Indication: atrial fibrillation  Allergies  Allergen Reactions   Codeine Nausea And Vomiting   Oxycodone Other (See Comments)    Upset GI    Patient Measurements: Height: 6\' 2"  (188 cm) Weight: 111.4 kg (245 lb 9.5 oz) IBW/kg (Calculated) : 82.2 Heparin Dosing Weight: 104.8kg  Vital Signs: Temp: 98.1 F (36.7 C) (11/14 0402) Temp Source: Oral (11/13 2012) BP: 111/82 (11/14 0402) Pulse Rate: 92 (11/14 0402)  Labs: Recent Labs    06/08/21 0639 06/08/21 0837 06/08/21 1452 06/09/21 0615 06/09/21 1234 06/10/21 0359  HGB 14.2  --   --  14.3  --  14.4  HCT 40.2  --   --  41.4  --  41.5  PLT 95*  --   --  114*  --  150  APTT  --  32   < > 76* 71* 105*  HEPARINUNFRC  --  >1.10*  --  1.00*  --  0.65  CREATININE 1.59*  --   --  1.10  --  0.90   < > = values in this interval not displayed.     Estimated Creatinine Clearance: 105.8 mL/min (by C-G formula based on SCr of 0.9 mg/dL).   Medical History: Past Medical History:  Diagnosis Date   Arthritis    CSF leak    a. 12/2019 following resection of vestibular schwannoma; b.  12/2020 status post lumbar puncture for ventriculomegaly; e.  01/2021: Status post lumbar drain ([facility]).  Pending VP shunt.   Herniated disc    High cholesterol    History of stress test    a. 09/2007 MV: EF 63%, small area of anterolateral and apical reversibility; b.  12/2016 MV: EF 62%.  Fixed small, mild mid anteroseptal and apical defect without reversibility.  Most likely attenuation.  Low risk study.   Hypertension    Morbid obesity ([place])    PAC (premature atrial contraction)    Persistent atrial fibrillation ([place])    a. first noted on 12 lead ECG 10/2019.   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 12/27/2019 s/p resection ([facility]). Post-op course complicated by CSF leak/seizures req repair 01/06/2020 and high volume LP on 01/16/2020.    Medications:   Scheduled:   atorvastatin  40 mg Oral Daily   metoprolol tartrate  25 mg Oral BID   sodium chloride flush  3 mL Intravenous Q12H    Assessment: 65yo male with PMH of HTN, dCHF, OSA (on CPAP), HLD, atrial fibrillation on Eliquis who was admitted to the hospital for rigors and chills. Patient is experiencing increase in acute encephalopathy and was transitioned form Eliquis to heparin infusion. Pharmacy was consulted for heparin infusion. Pt with new thrombocytopenia prior to heparin starting.    Last dose of Eliquis: 11/11 2157  11/12 1452 aPTT 42. HL (@0837 ) > 1.1 11/12 2145 aPTT 54  subtherapeutic inc to 2100 u/hr 11/13 0615 aPTT 76  therap x 1 11/13 1234 aPTT 71  therap x 2  11/14 0359 aPTT 105 (SUPRAtherapeutic) ,  HL 0.65 (therapeutic)   Goal of Therapy:  aPTT 66-102 seconds Monitor platelets by anticoagulation protocol: Yes   Plan:  11/14 @ 0359:  aPTT = 105 (SUPRAtherapeutic), HL 0.65 (therapeutic) aPTT and HL still do not correlate, will continue to use aPTT to guide dosing. Will decrease heparin drip rate to 2000 units/hr and recheck aPTT 6 hrs after  rate change.  Will recheck HL on 11/15 with AM labs.   Nicko Daher D, PharmD 06/10/2021 5:48 AM

## 2021-06-10 NOTE — NC FL2 (Signed)
[place] LEVEL OF CARE SCREENING TOOL     IDENTIFICATION  Patient Name: Brandon Marks Birthdate: 1956-04-03 Sex: male Admission Date (Current Location): 06/05/2021  [facility] and [place] Number:  Engineering geologist and Address:  [facility], [address], [place], [place] [address]      Provider Number: [id number]  Attending Physician Name and Address:  Kayleen Memos, DO  Relative Name and Phone Number:       Current Level of Care: Hospital Recommended Level of Care: [place] Prior Approval Number:    Date Approved/Denied:   PASRR Number: [id number]  Discharge Plan: SNF    Current Diagnoses: Patient Active Problem List   Diagnosis Date Noted   Atrial fibrillation with RVR ([place]) 06/08/2021   Sepsis secondary to UTI ([place]) 06/08/2021   Severe sepsis with septic shock ([place]) 06/06/2021   Left ureteral calculus 06/06/2021   Cardiogenic shock ([place]) 06/05/2021   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[place]) 02/22/2021   23-polyvalent pneumococcal polysaccharide vaccine declined 02/22/2021   Benign neoplasm of brain, unspecified brain region [facility]) 11/15/2020   Status post excision of acoustic neuroma 02/07/2020   S/P right TKA 09/13/2019   S/P left TKA 08/11/2019   Genu varum of both lower extremities 06/13/2019   Vitamin B12 deficiency 06/13/2019   Obesity (BMI 30-39.9) 12/27/2018   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   Tinnitus of right ear 12/27/2018   Gait disturbance 12/27/2018   Normochromic anemia 12/27/2018   Primary osteoarthritis of both knees 17/00/1749   Diastolic heart failure ([place]) 09/11/2017   DDD (degenerative disc disease), cervical 09/04/2017   Dyspnea, chronic DOE 02/27/2012   Herniated disc    Hyperlipidemia 44/96/7591   Diastolic dysfunction, left ventricle 02/23/2009   Essential hypertension 10/18/2007   OSA on CPAP 10/11/2007    Orientation RESPIRATION BLADDER Height &  Weight     Self, Time, Situation, Place  Normal Incontinent Weight: 245 lb 9.5 oz (111.4 kg) Height:  6\' 2"  (188 cm)  BEHAVIORAL SYMPTOMS/MOOD NEUROLOGICAL BOWEL NUTRITION STATUS      Continent Diet (please see dc summary)  AMBULATORY STATUS COMMUNICATION OF NEEDS Skin   Limited Assist Verbally Normal                       Personal Care Assistance Level of Assistance  Dressing, Feeding, Bathing Bathing Assistance: Limited assistance Feeding assistance: Independent Dressing Assistance: Limited assistance     Functional Limitations Info  Hearing, Sight, Speech Sight Info: Adequate Hearing Info: Adequate Speech Info: Adequate    SPECIAL CARE FACTORS FREQUENCY  PT (By licensed PT), OT (By licensed OT)     PT Frequency: 5x OT Frequency: 5x            Contractures Contractures Info: Not present    Additional Factors Info  Code Status, Allergies Code Status Info: full code Allergies Info: Codeine, Oxycodone           Current Medications (06/10/2021):  This is the current hospital active medication list Current Facility-Administered Medications  Medication Dose Route Frequency Provider Last Rate Last Admin   0.9 %  sodium chloride infusion  250 mL Intravenous PRN Theora Gianotti, NP       [START ON 06/11/2021] 0.9 %  sodium chloride infusion   Intravenous Continuous Theora Gianotti, NP       acetaminophen (TYLENOL) tablet 650 mg  650 mg Oral Q4H PRN Flora Lipps, MD   [phone]  mg at 06/08/21 0930   [START ON 06/11/2021] aspirin chewable tablet 81 mg  81 mg Oral Pre-Cath Theora Gianotti, NP       atorvastatin (LIPITOR) tablet 40 mg  40 mg Oral Daily Theora Gianotti, NP   40 mg at 06/09/21 2117   ceFAZolin (ANCEF) IVPB 2g/100 mL premix  2 g Intravenous Q8H Arrien, Jimmy Picket, MD 200 mL/hr at 06/10/21 0559 2 g at 06/10/21 0559   docusate sodium (COLACE) capsule 100 mg  100 mg Oral BID PRN Flora Lipps, MD       heparin ADULT  infusion 100 units/mL (25000 units/240mL)  2,000 Units/hr Intravenous Continuous Tawni Millers, MD 20 mL/hr at 06/10/21 1010 2,000 Units/hr at 06/10/21 1010   metoprolol tartrate (LOPRESSOR) tablet 25 mg  25 mg Oral BID Sueanne Margarita, MD   25 mg at 06/10/21 1005   ondansetron (ZOFRAN) injection 4 mg  4 mg Intravenous Q6H PRN Flora Lipps, MD       polyethylene glycol (MIRALAX / GLYCOLAX) packet 17 g  17 g Oral Daily PRN Flora Lipps, MD       sodium chloride flush (NS) 0.9 % injection 3 mL  3 mL Intravenous Q12H Theora Gianotti, NP   3 mL at 06/10/21 1006   sodium chloride flush (NS) 0.9 % injection 3 mL  3 mL Intravenous PRN Theora Gianotti, NP         Discharge Medications: Please see discharge summary for a list of discharge medications.  Relevant Imaging Results:  Relevant Lab Results:   Additional Information [phone]  Eileen Stanford, LCSW

## 2021-06-10 NOTE — Progress Notes (Signed)
PROGRESS NOTE    Brandon Marks  JHE:[id number] DOB: 1955/10/14 DOA: 06/05/2021 PCP: Brandon Pier, MD    Brief Narrative:  Brandon Marks was admitted to the hospital with the working diagnosis of left pyelonephritis in the setting of obstructive uropathy complicated with septic shock.   65 year old male with past medical history for hypertension, diastolic heart failure, obstructive sleep apnea, dyslipidemia, atrial fibrillation, vestibular schwannoma (sp resection), and gastric bypass surgery, who presented with fevers and chills.  Patient was not able to give detailed history, apparently EMS was called to due to back pain and constipation.  On route to the hospital he developed atrial fibrillation with rapid ventricular response, in the emergency department his blood pressure was 74/53, heart rate 162, respiratory rate 24 and oxygen saturation 98% on 2 L per nasal cannula.  His lungs are clear to auscultation bilaterally, heart S1-S2, present, tachycardic, abdomen soft, no lower extremity edema.  Sodium 136, potassium 3.7, chloride 105, bicarb 17, glucose 90, BUN 39, creatinine 2.47, height sensitive troponin 109-102.  White count 13.9, hemoglobin 14.3, hematocrit 41.0, platelets 106. SARS COVID-19 negative.  Urinalysis specific gravity 1.017, > 50 white cells, >50 red cells.  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  Positive hydrocephalus.  CT of the abdomen with acute hydronephrosis on the left.  13 mm stone in the proximal left ureter causing obstruction.  Chest radiograph no infiltrates.  EKG 174 bpm, normal axis, QTC 528, atrial fibrillation rhythm, no significant ST segment or T wave changes.  Patient received volume resuscitation with persistent hypotension. Required vasopressors with norepinephrine. She was placed on broad-spectrum antibiotic therapy.  Urology was consulted and patient underwent left left ureteral stent  placement on November 9.  Her blood cultures have been positive for Enterococcus and Klebsiella pneumonia.  For his atrial fibrillation patient was placed on amiodarone.  Patient transferred to [facility] on 05/07/21.   06/10/2021: Patient was seen at his bedside.  He denies having any chest pain or dyspnea.  He has no new complaints.  Plan for heart cath today.  Assessment & Plan:   Principal Problem:   Sepsis secondary to UTI [facility]) Active Problems:   Hyperlipidemia   Essential hypertension   Obesity (BMI 30-39.9)   Severe sepsis with septic shock (HCC)   Left ureteral calculus   Atrial fibrillation with RVR (H[facility])   Left pyelonephritis, complicated with septic shock and obstructive uropathy. Gram negative bacteremia with Klebsiella Pna. Acute metabolic encephalopathy.  Resolved sepsis, now off vasopressors, his blood pressure has been 102 to 185 mmHg systolic.  Sp left ureteral stent placement per urology.  Wbc 15.2, Klebsiella Pneumonia sensitive to cephalosporins.  Patient has been afebrile. Mentation has improved, encephalopathy has resolved.   Plan to continue antibiotic therapy with Cefazolin  Continue to follow up on cell count and temperature curve.   2. AKI renal function with serum cr down to 1,59, K is 3,7 and serum bicarbonate at 26, Mg is 2,3 Patient is tolerating po well, documented urine output 700 cc  Hold on IV fluids and follow up renal function in am, avoid hypotension and nephrotoxic medications.   3. Chronic atrial fibrillation with RVR  Rate controlled with metoprolol and anticoagulation with heparin drip.   Echocardiogram with decreased LV systolic function down to 30 to 35% with global hypokinesis, RV systolic function mildly reduced. Mild dilatation of left atrium.   Amiodarone has been discontinue and now transition to metoprolol, anticoagulation with  heparin in preparation for coronary angiography.   4. HTN , new systolic heart failure  No signs of  acute volume overload, echocardiogram with reduction in LV systolic function with EF 30 to 35% with global hypokinesis.  Continue blood pressure monitoring.  Plan for heart cath on 06/10/2021. Management per cardiology.  5. Dyslipidemia, obesity class 1. Continue with atorvastatin.  Calculated BMI is 30,9  Patient continue to be at high risk for worsening sepsis   Status is: Inpatient  Remains inpatient appropriate because: IV antibiotic therapy and telemetry monitoring    DVT prophylaxis: SCDs. Code Status:   full  Family Communication:   No family at the bedside     Consultants:  Cardioloy Urology   Procedures:  Ureteral stent   Antimicrobials:   Ceftriaxone change to cefazolin     Objective: Vitals:   06/10/21 0205 06/10/21 0402 06/10/21 0743 06/10/21 1119  BP:  111/82 120/75 124/88  Pulse:  92 (!) 107 98  Resp:  20 17 17   Temp:  98.1 F (36.7 C) 98.7 F (37.1 C) (!) 97.3 F (36.3 C)  TempSrc:      SpO2:  98% 95% 98%  Weight: 111.4 kg     Height:        Intake/Output Summary (Last 24 hours) at 06/10/2021 1315 Last data filed at 06/10/2021 0558 Gross per 24 hour  Intake 978.45 ml  Output 690 ml  Net 288.45 ml   Filed Weights   06/08/21 1807 06/09/21 0500 06/10/21 0205  Weight: 110.1 kg 110.6 kg 111.4 kg    Examination:   Marks: Not in pain or dyspnea, deconditioned  Neurology: Awake and alert, non focal  E ENT: no pallor, no icterus, oral mucosa moist Cardiovascular: No JVD. S1-S2 present, irregularly irregular, no gallops, rubs, or murmurs. No lower extremity edema. Pulmonary: positive breath sounds bilaterally, adequate air movement, no wheezing, rhonchi or rales. Gastrointestinal. Abdomen soft and non tender Skin. No rashes Musculoskeletal: no joint deformities     Data Reviewed: I have personally reviewed following labs and imaging studies  CBC: Recent Labs  Lab 06/06/21 0030 06/07/21 0414 06/08/21 0639 06/09/21 0615  06/10/21 0359  WBC 26.2* 24.6* 15.2* 14.3* 14.6*  HGB 14.7 13.0 14.2 14.3 14.4  HCT 43.9 37.9* 40.2 41.4 41.5  MCV 93.8 86.9 88.4 86.6 87.9  PLT 85* 77* 95* 114* 921   Basic Metabolic Panel: Recent Labs  Lab 06/05/21 2148 06/06/21 0030 06/06/21 1228 06/07/21 0414 06/08/21 0639 06/09/21 0615 06/10/21 0359  NA 135   < > 135 137 138 137 137  K 3.7   < > 4.1 3.8 3.7 3.9 4.2  CL 108   < > 108 109 105 105 107  CO2 18*   < > 18* 21* 26 25 24   GLUCOSE 109*   < > 123* 90 78 90 79  BUN 45*   < > 56* 66* 57* 44* 36*  CREATININE 2.68*   < > 2.74* 2.42* 1.59* 1.10 0.90  CALCIUM 7.8*   < > 7.9* 7.9* 8.1* 7.9* 8.0*  MG 2.1  --   --  2.3 2.3  --   --   PHOS  --   --   --  3.1 2.2*  --   --    < > = values in this interval not displayed.   GFR: Estimated Creatinine Clearance: 105.8 mL/min (by C-G formula based on SCr of 0.9 mg/dL). Liver Function Tests: Recent Labs  Lab 06/05/21 1350  AST  36  ALT 16  ALKPHOS 114  BILITOT 1.6*  PROT 5.9*  ALBUMIN 3.1*   Recent Labs  Lab 06/05/21 2148  LIPASE 19   Recent Labs  Lab 06/05/21 2147  AMMONIA 35   Coagulation Profile: No results for input(s): INR, PROTIME in the last 168 hours. Cardiac Enzymes: No results for input(s): CKTOTAL, CKMB, CKMBINDEX, TROPONINI in the last 168 hours. BNP (last 3 results) No results for input(s): PROBNP in the last 8760 hours. HbA1C: No results for input(s): HGBA1C in the last 72 hours. CBG: Recent Labs  Lab 06/05/21 1350 06/05/21 2029  GLUCAP 91 95   Lipid Profile: No results for input(s): CHOL, HDL, LDLCALC, TRIG, CHOLHDL, LDLDIRECT in the last 72 hours. Thyroid Function Tests: No results for input(s): TSH, T4TOTAL, FREET4, T3FREE, THYROIDAB in the last 72 hours.  Anemia Panel: No results for input(s): VITAMINB12, FOLATE, FERRITIN, TIBC, IRON, RETICCTPCT in the last 72 hours.    Radiology Studies: I have reviewed all of the imaging during this hospital visit  personally     Scheduled Meds:  [START ON 06/11/2021] aspirin  81 mg Oral Pre-Cath   atorvastatin  40 mg Oral Daily   metoprolol tartrate  25 mg Oral BID   sodium chloride flush  3 mL Intravenous Q12H   Continuous Infusions:  sodium chloride     [START ON 06/11/2021] sodium chloride      ceFAZolin (ANCEF) IV 2 g (06/10/21 0559)   heparin 2,000 Units/hr (06/10/21 1010)     LOS: 5 days        Kayleen Memos, MD

## 2021-06-11 ENCOUNTER — Ambulatory Visit: Payer: Medicare Other | Attending: Internal Medicine | Admitting: Internal Medicine

## 2021-06-11 ENCOUNTER — Encounter: Payer: Self-pay | Admitting: Cardiovascular Disease

## 2021-06-11 ENCOUNTER — Inpatient Hospital Stay: Payer: Medicare Other

## 2021-06-11 DIAGNOSIS — I4891 Unspecified atrial fibrillation: Secondary | ICD-10-CM | POA: Diagnosis not present

## 2021-06-11 DIAGNOSIS — I5020 Unspecified systolic (congestive) heart failure: Secondary | ICD-10-CM | POA: Diagnosis not present

## 2021-06-11 DIAGNOSIS — N390 Urinary tract infection, site not specified: Secondary | ICD-10-CM | POA: Diagnosis not present

## 2021-06-11 DIAGNOSIS — I428 Other cardiomyopathies: Secondary | ICD-10-CM | POA: Diagnosis not present

## 2021-06-11 DIAGNOSIS — A419 Sepsis, unspecified organism: Secondary | ICD-10-CM | POA: Diagnosis not present

## 2021-06-11 LAB — BASIC METABOLIC PANEL
Anion gap: 4 — ABNORMAL LOW (ref 5–15)
BUN: 26 mg/dL — ABNORMAL HIGH (ref 8–23)
CO2: 27 mmol/L (ref 22–32)
Calcium: 8.2 mg/dL — ABNORMAL LOW (ref 8.9–10.3)
Chloride: 108 mmol/L (ref 98–111)
Creatinine, Ser: 0.84 mg/dL (ref 0.61–1.24)
GFR, Estimated: >60 mL/min (ref >60)
Glucose, Bld: 77 mg/dL (ref 70–99)
Potassium: 4.1 mmol/L (ref 3.5–5.1)
Sodium: 139 mmol/L (ref 135–145)

## 2021-06-11 LAB — CBC
HCT: 40.2 % (ref 39.0–52.0)
Hemoglobin: 13.6 g/dL (ref 13.0–17.0)
MCH: 30 pg (ref 26.0–34.0)
MCHC: 33.8 g/dL (ref 30.0–36.0)
MCV: 88.5 fL (ref 80.0–100.0)
Platelets: 212 10*3/uL (ref 150–400)
RBC: 4.54 MIL/uL (ref 4.22–5.81)
RDW: 14.7 % (ref 11.5–15.5)
WBC: 14 10*3/uL — ABNORMAL HIGH (ref 4.0–10.5)
nRBC: 0 % (ref 0.0–0.2)

## 2021-06-11 MED ORDER — LOSARTAN POTASSIUM 25 MG PO TABS
12.5000 mg | ORAL_TABLET | Freq: Every day | ORAL | Status: DC
  Administered 2021-06-11 – 2021-06-13 (×3): 12.5 mg via ORAL
  Filled 2021-06-11 (×3): qty 1

## 2021-06-11 MED ORDER — METOPROLOL TARTRATE 25 MG PO TABS
37.5000 mg | ORAL_TABLET | Freq: Two times a day (BID) | ORAL | Status: DC
  Administered 2021-06-11 – 2021-06-12 (×2): 37.5 mg via ORAL
  Filled 2021-06-11 (×2): qty 2

## 2021-06-11 MED ORDER — METOPROLOL TARTRATE 25 MG PO TABS
12.5000 mg | ORAL_TABLET | Freq: Once | ORAL | Status: AC
  Administered 2021-06-11: 12.5 mg via ORAL
  Filled 2021-06-11: qty 1

## 2021-06-11 NOTE — Progress Notes (Signed)
Occupational Therapy Treatment Patient Details Name: Brandon Marks MRN: [id number] DOB: Nov 07, 1955 Today's Date: 06/11/2021   History of present illness Pt is a 65 y.o. male presenting to hospital 11/9 with not feeling well and shaking spells.  Pt admitted with obstructing L proximal ureteral calculus, AKI, sepsis, AMS, and a-fib with RVR.  S/p L ureteral stent placement 06/05/21.  PMH includes herniated disc, htn, morbid obesity, sleep apnea, B TKA, OSA on CPAP, brain tumor (removed 12/27/19), necrotizing fasciitis R buttock, gastric bypass; also h/o vestibular schwannoma status postresection complicated by CSF leak requiring lumbar puncture, drain, requiring a VP shunt not yet done.   OT comments  Pt seen for OT tx this date. Pt received seated EOB with RN for medication and also preparing to perform LB dressing after toileting. Pt required assist to thread mesh undergarments over feet and ultimately also required assist to complete donning over hips in standing to allow for UE support on RW for maximal standing balance/safety. Also MAX A for pericare in standing for thoroughness. Pt instructed in RUE precautions following radial cardiac cath previous date and pt verbalized understanding. Very minimal WBing in standing with RW through RUE. Pt returned to bed afterwards requiring MIN A for BLE mgt back to bed. Pt progressing slowly towards goals and continues to benefit from skilled OT services to maximize return to PLOF. D/c recommendation updated based on progress. Will need to see additional progress prior to recommendation for safe discharge home.   Recommendations for follow up therapy are one component of a multi-disciplinary discharge planning process, led by the attending physician.  Recommendations may be updated based on patient status, additional functional criteria and insurance authorization.    Follow Up Recommendations  Skilled nursing-short term rehab (<3 hours/day)    Assistance  Recommended at Discharge Intermittent Supervision/Assistance  Equipment Recommendations  None recommended by OT    Recommendations for Other Services      Precautions / Restrictions Precautions Precautions: Fall Restrictions Weight Bearing Restrictions: No       Mobility Bed Mobility Overal bed mobility: Needs Assistance Bed Mobility: Sit to Supine       Sit to supine: Min assist   General bed mobility comments: Min A for BLE mgt    Transfers Overall transfer level: Needs assistance Equipment used: Rolling walker (2 wheels);1 person hand held assist Transfers: Sit to/from Stand Sit to Stand: Supervision;From elevated surface           General transfer comment: STS with SBA with and without RW     Balance Overall balance assessment: Needs assistance Sitting-balance support: No upper extremity supported;Feet supported Sitting balance-Leahy Scale: Good     Standing balance support: Reliant on assistive device for balance;During functional activity;Single extremity supported Standing balance-Leahy Scale: Fair                             ADL either performed or assessed with clinical judgement   ADL Overall ADL's : Needs assistance/impaired                     Lower Body Dressing: Sit to/from stand;Moderate assistance Lower Body Dressing Details (indicate cue type and reason): Min A to initiate threading over feet and to complete donning brief over hips 2/2 decr balance, strength, activity tolerance     Toileting- Clothing Manipulation and Hygiene: Sit to/from stand;Maximal assistance Toileting - Clothing Manipulation Details (indicate cue type and reason): Max A  for pericare in standing            Extremity/Trunk Assessment Upper Extremity Assessment Upper Extremity Assessment: RUE deficits/detail RUE Deficits / Details: s/p radial cardiac cath on 11/14 - precautions   Lower Extremity Assessment Lower Extremity Assessment:  Generalized weakness   Cervical / Trunk Assessment Cervical / Trunk Assessment: Normal    Vision       Perception     Praxis      Cognition Arousal/Alertness: Awake/alert Behavior During Therapy: Flat affect Overall Cognitive Status: Within Functional Limits for tasks assessed                                            Exercises Other Exercises Other Exercises: Pt instructed in RUE s/p cath precautions for RUE (no push/pull/lift >5-10lb, treat like a "broken wing")   Shoulder Instructions       General Comments      Pertinent Vitals/ Pain       Pain Assessment: No/denies pain  Home Living                                          Prior Functioning/Environment              Frequency  Min 2X/week        Progress Toward Goals  OT Goals(current goals can now be found in the care plan section)  Progress towards OT goals: Progressing toward goals  Acute Rehab OT Goals Patient Stated Goal: to go home OT Goal Formulation: With patient Time For Goal Achievement: 06/23/21 Potential to Achieve Goals: Good  Plan Frequency remains appropriate;Discharge plan needs to be updated    Co-evaluation                 AM-PAC OT "6 Clicks" Daily Activity     Outcome Measure   Help from another person eating meals?: None Help from another person taking care of personal grooming?: None Help from another person toileting, which includes using toliet, bedpan, or urinal?: A Little Help from another person bathing (including washing, rinsing, drying)?: A Little Help from another person to put on and taking off regular upper body clothing?: A Little Help from another person to put on and taking off regular lower body clothing?: A Lot 6 Click Score: 19    End of Session Equipment Utilized During Treatment: Rolling walker (2 wheels)  OT Visit Diagnosis: Unsteadiness on feet (R26.81);Muscle weakness (generalized) (M62.81);History  of falling (Z91.81)   Activity Tolerance Patient tolerated treatment well   Patient Left in bed;with call bell/phone within reach;with bed alarm set   Nurse Communication          Time: 5681-2751 OT Time Calculation (min): 15 min  Charges: OT General Charges $OT Visit: 1 Visit OT Treatments $Self Care/Home Management : 8-22 mins  Ardeth Perfect., MPH, MS, OTR/L ascom [phone] 06/11/21, 4:32 PM

## 2021-06-11 NOTE — Progress Notes (Signed)
Physical Therapy Treatment Patient Details Name: Brandon Marks MRN: [id number] DOB: 05/20/1956 Today's Date: 06/11/2021   History of Present Illness Pt is a 65 y.o. male presenting to hospital 11/9 with not feeling well and shaking spells.  Pt admitted with obstructing L proximal ureteral calculus, AKI, sepsis, AMS, and a-fib with RVR.  S/p L ureteral stent placement 06/05/21.  PMH includes herniated disc, htn, morbid obesity, sleep apnea, B TKA, OSA on CPAP, brain tumor (removed 12/27/19), necrotizing fasciitis R buttock, gastric bypass; also h/o vestibular schwannoma status postresection complicated by CSF leak requiring lumbar puncture, drain, requiring a VP shunt not yet done.    PT Comments    Pt received supine in bed agreeable to PT services. Able to transfer to EOB with HOB elevated and increased time with supervision. Denies dizziness today. Stands to RW requiring bed elevated as unable to stand from lowered surface due to LE weakness. With bed elevated, able to stand to RW with supervision. Pt refusing to let pt check telemetry prior to mobility. Cuing provided for safe hand placement but no carryover noted. Pt amb in room with minguard to bathroom endorsing need to urinate. Able to perform all toileting with supervision and assist with IV pole management. Tolerated amb additional 100'  with minguard to supervision as pt keeps RW far outside BOS despite multimodal cues to correct. Noted telemetry monitor in hallway recording pt's HR at 156 BPM. Pt educated on energy conservation and standing rest for 30 sec performed. Pt does not display SOB but is slightly hunched over walker as if fatigued. Reports being far below baseline. Pt encouraged to return to room due to HR and placed in recliner. HR does decrease to 120's with seated rest. RN notified post session. Although pt tolerated progression in tolerance for amb, pt displays significant deficits in safety awareness, safe use of DME, and LE  strength/endurance thus benefiting from STR prior to transitioning back to home environment.    Recommendations for follow up therapy are one component of a multi-disciplinary discharge planning process, led by the attending physician.  Recommendations may be updated based on patient status, additional functional criteria and insurance authorization.  Follow Up Recommendations  Skilled nursing-short term rehab (<3 hours/day)     Assistance Recommended at Discharge Frequent or constant Supervision/Assistance  Equipment Recommendations  Rolling walker (2 wheels)    Recommendations for Other Services       Precautions / Restrictions Precautions Precautions: Fall Restrictions Weight Bearing Restrictions: No     Mobility  Bed Mobility Overal bed mobility: Needs Assistance Bed Mobility: Supine to Sit     Supine to sit: Supervision     General bed mobility comments: in recliner post session Patient Response: Flat affect;Cooperative  Transfers Overall transfer level: Needs assistance Equipment used: Rolling walker (2 wheels) Transfers: Sit to/from Stand Sit to Stand: Supervision           General transfer comment: Requires VC's for safe hand placement but does not display carry over after cues. Maintains BUE's on RW with standing.    Ambulation/Gait Ambulation/Gait assistance: Supervision Gait Distance (Feet): 100 Feet Assistive device: Rolling walker (2 wheels) Gait Pattern/deviations: Step-through pattern;Trunk flexed;Drifts right/left;Decreased step length - left;Decreased step length - right       General Gait Details: Denies dizziness today. AMb with RW far outside BOS despite frequent cuing. Elevated HR to 150's with distance performed. Standing rest ~30 sec before returning to room.   Stairs  Wheelchair Mobility    Modified Rankin (Stroke Patients Only)       Balance Overall balance assessment: Needs assistance Sitting-balance  support: No upper extremity supported;Feet supported Sitting balance-Leahy Scale: Good     Standing balance support: Reliant on assistive device for balance;During functional activity;Single extremity supported Standing balance-Leahy Scale: Fair Standing balance comment: ABle to urinate in toielt in standing with SUE support. Does rely on RW when ambulating.                            Cognition Arousal/Alertness: Awake/alert Behavior During Therapy: Flat affect Overall Cognitive Status: Within Functional Limits for tasks assessed                                 General Comments: Slightly stubborn in mobility, states he can do it on his own but displays minor safeconcerns. Slight agitation with attempts to assist.        Exercises Other Exercises Other Exercises: safe use of DME to reduce risk of falls.    General Comments General comments (skin integrity, edema, etc.): HR up to 156 BPM with amb. Returned to room. In sitting HR decreased to 120's. RN notified.      Pertinent Vitals/Pain Pain Assessment: No/denies pain    Home Living                          Prior Function            PT Goals (current goals can now be found in the care plan section) Acute Rehab PT Goals Patient Stated Goal: to improve strength and mobility PT Goal Formulation: With patient Time For Goal Achievement: 06/23/21 Potential to Achieve Goals: Good Progress towards PT goals: Progressing toward goals    Frequency    Min 2X/week      PT Plan Current plan remains appropriate    Co-evaluation              AM-PAC PT "6 Clicks" Mobility   Outcome Measure  Help needed turning from your back to your side while in a flat bed without using bedrails?: None Help needed moving from lying on your back to sitting on the side of a flat bed without using bedrails?: A Little Help needed moving to and from a bed to a chair (including a wheelchair)?: A  Little Help needed standing up from a chair using your arms (e.g., wheelchair or bedside chair)?: A Little Help needed to walk in hospital room?: A Little Help needed climbing 3-5 steps with a railing? : A Lot 6 Click Score: 18    End of Session Equipment Utilized During Treatment: Gait belt Activity Tolerance: Treatment limited secondary to medical complications (Comment) (Elevated HR in 150's) Patient left: in chair;with call bell/phone within reach;with chair alarm set Nurse Communication: Mobility status PT Visit Diagnosis: Other abnormalities of gait and mobility (R26.89);Muscle weakness (generalized) (M62.81)     Time: 5366-4403 PT Time Calculation (min) (ACUTE ONLY): 31 min  Charges:  $Gait Training: 8-22 mins $Therapeutic Activity: 8-22 mins                    Emari Hreha M. Fairly IV, PT, DPT Physical Therapist- [facility]  06/11/2021, 11:22 AM

## 2021-06-11 NOTE — Progress Notes (Signed)
PROGRESS NOTE    [name]  IRS:[id number] DOB: [birth year] DOA: 06/05/2021 PCP: Ladell Pier, MD    Brief Narrative:  Brandon Marks was admitted to the hospital with the working diagnosis of left pyelonephritis in the setting of obstructive uropathy complicated with septic shock.   65 year old male with past medical history for hypertension, diastolic heart failure, obstructive sleep apnea, dyslipidemia, atrial fibrillation, vestibular schwannoma (sp resection), and gastric bypass surgery, who presented with fevers and chills.  Patient was not able to give detailed history, apparently EMS was called to due to back pain and constipation.  On route to the hospital he developed atrial fibrillation with rapid ventricular response, in the emergency department his blood pressure was 74/53, heart rate 162, respiratory rate 24 and oxygen saturation 98% on 2 L per nasal cannula.  His lungs are clear to auscultation bilaterally, heart S1-S2, present, tachycardic, abdomen soft, no lower extremity edema.  Sodium 136, potassium 3.7, chloride 105, bicarb 17, glucose 90, BUN 39, creatinine 2.47, height sensitive troponin 109-102.  White count 13.9, hemoglobin 14.3, hematocrit 41.0, platelets 106. SARS COVID-19 negative.  Urinalysis specific gravity 1.017, > 50 white cells, >50 red cells.  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  Positive hydrocephalus.  CT of the abdomen with acute hydronephrosis on the left.  13 mm stone in the proximal left ureter causing obstruction.  Chest radiograph no infiltrates.  EKG 174 bpm, normal axis, QTC 528, atrial fibrillation rhythm, no significant ST segment or T wave changes.  Patient received volume resuscitation with persistent hypotension. Required vasopressors with norepinephrine. She was placed on broad-spectrum antibiotic therapy.  Urology was consulted and patient underwent left left ureteral stent  placement on November 9.  Her blood cultures have been positive for Enterococcus and Klebsiella pneumonia.  For his atrial fibrillation patient was placed on amiodarone.  Patient transferred to [facility] on 06/07/21.  I did left heart cath and coronary angiography on 06/10/2021.  06/11/2021: Patient was seen at his bedside.  Denies chest pain or shortness of breath.  Seen by cardiology.  Assessment & Plan:   Principal Problem:   Sepsis secondary to UTI [facility]) Active Problems:   Hyperlipidemia   Essential hypertension   Obesity (BMI 30-39.9)   Severe sepsis with septic shock (HCC)   Left ureteral calculus   Atrial fibrillation with rapid ventricular response (HCC)   Acute systolic heart failure (HCC)   Left pyelonephritis, complicated with septic shock and obstructive uropathy. Gram negative bacteremia with Klebsiella Pnae.  Resolved acute metabolic encephalopathy.  Sepsis criteria has resolved, off vasopressors. Sp left ureteral stent placement per urology.  Infectious disease assisting with the management. Currently on cefazolin with plan to complete 2 weeks total of antibiotics.  HFrEF 30 to 35% secondary to nonischemic cardiomyopathy Status post left heart cath and coronary angiography on 06/10/2021.  It showed nonobstructive CAD with normal LVEDP and moderately reduced LVEF. Continue management as recommended by cardiology Continue Lopressor 37.5 mg twice daily as recommended by cardiology ARB, losartan 12.5 mg daily was started. Start strict I's and O's and daily weight Closely monitor vital signs and renal function  Resolved AKI  Creatinine is back to his baseline 0.8 with GFR greater than 60.  His creatinine peaked at 2.42 with GFR of 29 on 06/07/2021.   Continue to avoid nephrotoxic agents and hypotension.  HTN , new systolic heart failure  Management per cardiology as stated above.  Dyslipidemia, obesity class 1.  Continue with atorvastatin 40 mg daily.  Calculated BMI  is 31.  Physical debility Continue PT OT with assistance and fall precautions. Plan to discharge to SNF [facility] assisting with SNF placement.   Patient continue to be at high risk for worsening sepsis   Status is: Inpatient  Remains inpatient appropriate because: IV antibiotic therapy and telemetry monitoring    DVT prophylaxis: Eliquis. Code Status:   full  Family Communication: None at bedside.   Consultants:  Cardioloy Urology  Infectious disease.  Procedures:  Ureteral stent   Antimicrobials:   Ceftriaxone change to cefazolin     Objective: Vitals:   06/11/21 0453 06/11/21 0741 06/11/21 1115 06/11/21 1614  BP: (!) 134/94 (!) 131/98 131/80 130/90  Pulse: 93 98 96 96  Resp: 18 20 20 20   Temp: 98.3 F (36.8 C) 98.2 F (36.8 C) 98 F (36.7 C) 98 F (36.7 C)  TempSrc:      SpO2: 98% 97% 97% 98%  Weight:      Height:        Intake/Output Summary (Last 24 hours) at 06/11/2021 1657 Last data filed at 06/11/2021 1514 Gross per 24 hour  Intake 1698.95 ml  Output 1900 ml  Net -201.05 ml   Filed Weights   06/08/21 1807 06/09/21 0500 06/10/21 0205  Weight: 110.1 kg 110.6 kg 111.4 kg    Examination:   General: Well-developed well-nourished in no acute distress.  He is alert and interactive.   Neurology: Alert and awake.   E ENT: Oral mucosa is moist.   Cardiovascular: Irregular rate and rhythm no rubs or gallops.  No JVD or thyromegaly noted.   Pulmonary: Clear to auscultation no wheezes or rales.  Good inspiratory effort. Gastrointestinal.  Abdomen is soft nontender normal bowel sounds present. Skin.  No rashes or ulcerative lesions noted. Musculoskeletal: No joint deformities.     Data Reviewed: I have personally reviewed following labs and imaging studies  CBC: Recent Labs  Lab 06/07/21 0414 06/08/21 0639 06/09/21 0615 06/10/21 0359 06/11/21 0452  WBC 24.6* 15.2* 14.3* 14.6* 14.0*  HGB 13.0 14.2 14.3 14.4 13.6  HCT 37.9* 40.2 41.4 41.5 40.2   MCV 86.9 88.4 86.6 87.9 88.5  PLT 77* 95* 114* 150 416   Basic Metabolic Panel: Recent Labs  Lab 06/05/21 2148 06/06/21 0030 06/07/21 0414 06/08/21 0639 06/09/21 0615 06/10/21 0359 06/11/21 0452  NA 135   < > 137 138 137 137 139  K 3.7   < > 3.8 3.7 3.9 4.2 4.1  CL 108   < > 109 105 105 107 108  CO2 18*   < > 21* 26 25 24 27   GLUCOSE 109*   < > 90 78 90 79 77  BUN 45*   < > 66* 57* 44* 36* 26*  CREATININE 2.68*   < > 2.42* 1.59* 1.10 0.90 0.84  CALCIUM 7.8*   < > 7.9* 8.1* 7.9* 8.0* 8.2*  MG 2.1  --  2.3 2.3  --   --   --   PHOS  --   --  3.1 2.2*  --   --   --    < > = values in this interval not displayed.   GFR: Estimated Creatinine Clearance: 113.3 mL/min (by C-G formula based on SCr of 0.84 mg/dL). Liver Function Tests: Recent Labs  Lab 06/05/21 1350  AST 36  ALT 16  ALKPHOS 114  BILITOT 1.6*  PROT 5.9*  ALBUMIN 3.1*   Recent Labs  Lab 06/05/21 2148  LIPASE 19   Recent Labs  Lab 06/05/21 2147  AMMONIA 35   Coagulation Profile: No results for input(s): INR, PROTIME in the last 168 hours. Cardiac Enzymes: No results for input(s): CKTOTAL, CKMB, CKMBINDEX, TROPONINI in the last 168 hours. BNP (last 3 results) No results for input(s): PROBNP in the last 8760 hours. HbA1C: No results for input(s): HGBA1C in the last 72 hours. CBG: Recent Labs  Lab 06/05/21 1350 06/05/21 2029  GLUCAP 91 95   Lipid Profile: No results for input(s): CHOL, HDL, LDLCALC, TRIG, CHOLHDL, LDLDIRECT in the last 72 hours. Thyroid Function Tests: No results for input(s): TSH, T4TOTAL, FREET4, T3FREE, THYROIDAB in the last 72 hours.  Anemia Panel: No results for input(s): VITAMINB12, FOLATE, FERRITIN, TIBC, IRON, RETICCTPCT in the last 72 hours.    Radiology Studies: I have reviewed all of the imaging during this hospital visit personally     Scheduled Meds:  apixaban  5 mg Oral BID   atorvastatin  40 mg Oral Daily   losartan  12.5 mg Oral Daily   metoprolol  tartrate  37.5 mg Oral BID   sodium chloride flush  3 mL Intravenous Q12H   sodium chloride flush  3 mL Intravenous Q12H   Continuous Infusions:  sodium chloride 1,000 mL (06/10/21 2324)    ceFAZolin (ANCEF) IV 2 g (06/11/21 1358)     LOS: 6 days        Kayleen Memos, MD

## 2021-06-11 NOTE — Plan of Care (Signed)
  Problem: Clinical Measurements: Goal: Respiratory complications will improve Outcome: Progressing   Problem: Activity: Goal: Risk for activity intolerance will decrease Outcome: Progressing   Problem: Coping: Goal: Level of anxiety will decrease Outcome: Progressing   

## 2021-06-11 NOTE — Progress Notes (Signed)
Progress Note  Patient Name: Brandon Marks Date of Encounter: 06/11/2021  [facility] Cardiologist: Sanda Klein, MD   Subjective   No chest pain, shortness of breath, or palpitations.  Inpatient Medications    Scheduled Meds:  apixaban  5 mg Oral BID   atorvastatin  40 mg Oral Daily   losartan  12.5 mg Oral Daily   metoprolol tartrate  37.5 mg Oral BID   sodium chloride flush  3 mL Intravenous Q12H   sodium chloride flush  3 mL Intravenous Q12H   Continuous Infusions:  sodium chloride 1,000 mL (06/10/21 2324)    ceFAZolin (ANCEF) IV 2 g (06/11/21 0604)   PRN Meds: sodium chloride, acetaminophen, docusate sodium, ondansetron (ZOFRAN) IV, polyethylene glycol, sodium chloride flush   Vital Signs    Vitals:   06/11/21 0046 06/11/21 0453 06/11/21 0741 06/11/21 1115  BP: (!) 129/93 (!) 134/94 (!) 131/98 131/80  Pulse: 86 93 98 96  Resp: 18 18 20 20   Temp: 98.7 F (37.1 C) 98.3 F (36.8 C) 98.2 F (36.8 C) 98 F (36.7 C)  TempSrc:      SpO2: 97% 98% 97% 97%  Weight:      Height:        Intake/Output Summary (Last 24 hours) at 06/11/2021 1304 Last data filed at 06/11/2021 0945 Gross per 24 hour  Intake 1458.95 ml  Output 1400 ml  Net 58.95 ml   Last 3 Weights 06/10/2021 06/09/2021 06/08/2021  Weight (lbs) 245 lb 9.5 oz 243 lb 13.3 oz 242 lb 12.8 oz  Weight (kg) 111.4 kg 110.6 kg 110.133 kg      Telemetry    Atrial fibrillation with heart rates typically 80-100 bpm but elevated up to 150 bpm this morning - Personally Reviewed  ECG   No new tracing  Physical Exam   GEN: No acute distress.   Neck: No JVD Cardiac: Irregularly irregular, no murmurs, rubs, or gallops.  Respiratory: Clear to auscultation bilaterally. GI: Soft, nontender, non-distended  MS: Trace ankle edema; no deformity.  Right radial catheterization site without hematoma.  2+ right radial pulse present. Neuro:  Nonfocal  Psych: Normal affect   Labs    High Sensitivity  Troponin:   Recent Labs  Lab 06/05/21 1350 06/05/21 1552  TROPONINIHS 109* 102*     Chemistry Recent Labs  Lab 06/05/21 1350 06/05/21 1811 06/05/21 2148 06/06/21 0030 06/07/21 0414 06/08/21 0639 06/09/21 0615 06/10/21 0359 06/11/21 0452  NA 136  --  135   < > 137 138 137 137 139  K 3.7  --  3.7   < > 3.8 3.7 3.9 4.2 4.1  CL 105  --  108   < > 109 105 105 107 108  CO2 17*  --  18*   < > 21* 26 25 24 27   GLUCOSE 90  --  109*   < > 90 78 90 79 77  BUN 39*  --  45*   < > 66* 57* 44* 36* 26*  CREATININE 2.47*   < > 2.68*   < > 2.42* 1.59* 1.10 0.90 0.84  CALCIUM 8.4*  --  7.8*   < > 7.9* 8.1* 7.9* 8.0* 8.2*  MG  --   --  2.1  --  2.3 2.3  --   --   --   PROT 5.9*  --   --   --   --   --   --   --   --  ALBUMIN 3.1*  --   --   --   --   --   --   --   --   AST 36  --   --   --   --   --   --   --   --   ALT 16  --   --   --   --   --   --   --   --   ALKPHOS 114  --   --   --   --   --   --   --   --   BILITOT 1.6*  --   --   --   --   --   --   --   --   GFRNONAA 28*   < > 25*   < > 29* 47* >60 >60 >60  ANIONGAP 14  --  9   < > 7 7 7 6  4*   < > = values in this interval not displayed.    Lipids No results for input(s): CHOL, TRIG, HDL, LABVLDL, LDLCALC, CHOLHDL in the last 168 hours.  Hematology Recent Labs  Lab 06/09/21 0615 06/10/21 0359 06/11/21 0452  WBC 14.3* 14.6* 14.0*  RBC 4.78 4.72 4.54  HGB 14.3 14.4 13.6  HCT 41.4 41.5 40.2  MCV 86.6 87.9 88.5  MCH 29.9 30.5 30.0  MCHC 34.5 34.7 33.8  RDW 14.6 14.6 14.7  PLT 114* 150 212   Thyroid  Recent Labs  Lab 06/05/21 2148  TSH 1.493    BNP Recent Labs  Lab 06/05/21 1350  BNP 738.0*    DDimer No results for input(s): DDIMER in the last 168 hours.   Radiology    CARDIAC CATHETERIZATION  Result Date: 06/10/2021   There is moderate left ventricular systolic dysfunction.   LV Ilina Xu diastolic pressure is normal.   The left ventricular ejection fraction is 35-45% by visual estimate. 1.  Minimal luminal  irregularities with no evidence of obstructive coronary artery disease. 2.  Moderately reduced LV systolic function with global hypokinesis.  Normal left ventricular Ahniyah Giancola-diastolic pressure. Recommendations: The patient has nonischemic cardiomyopathy.  Recommend medical therapy. Eliquis can be resumed later tonight.   US RENAL  Result Date: 06/11/2021 CLINICAL DATA:  Left hydronephrosis EXAM: RENAL / URINARY TRACT ULTRASOUND COMPLETE COMPARISON:  CT done on 06/05/2021 FINDINGS: Right Kidney: Renal measurements: 11.9 x 5.1 x 7.2 cm = volume: 227.6 mL. Echogenicity within normal limits. No mass or hydronephrosis visualized. Left Kidney: Renal measurements: 12.1 x 7.1 x 5.8 cm = volume: 260.3 mL. Echogenicity within normal limits. No mass or hydronephrosis visualized. Bladder: Urinary bladder is not distended. Left ureteral stent is noted. Both ureteral jets were observed. Other: None. IMPRESSION: There is interval resolution of left hydronephrosis. Left ureteral stent is noted. Electronically Signed   By: Elmer Picker M.D.   On: 06/11/2021 09:26    Cardiac Studies   See LHC results above.  TTE (06/07/2021):  1. Challenging images.   2. Left ventricular ejection fraction, by estimation, is 30 to 35%. The  left ventricle has moderately decreased function. The left ventricle  demonstrates global hypokinesis. Left ventricular diastolic parameters are  indeterminate.   3. Right ventricular systolic function is mildly reduced. The right  ventricular size is mildly enlarged.   4. Left atrial size was mildly dilated.   5. The mitral valve is normal in structure. No evidence of mitral valve  regurgitation. No evidence of mitral  stenosis.   6. The aortic valve was not well visualized. Aortic valve regurgitation  is not visualized. No aortic stenosis is present.   Patient Profile     65 y.o. male with history of persistent atrial fibrillation, obesity status post gastric bypass, vestibular  schwannoma status post resection complicated by CSF leak requiring lumbar punctures/drain, hyperlipidemia, hypertension, and prior low risk stress tests, who was admitted 11/9 w/ sepsis, AKI in the setting of L hydronephrosis/obstructive uropathy/ureteral stone s/p emergent stenting, klebsiella bacteremia, elevated HsTrop, and afib w/ RVR (likely persistent).  Echo w/ EF of 30-35%, glob HK.  Assessment & Plan    Atrial fibrillation with rapid ventricular response: Ventricular rates had been well controlled up until this morning.  Question if increased activity this morning may have driven up heart rates, which reached as high as 150 bpm.  Patient asymptomatic. -Increase metoprolol to tartrate to 37.5 mg twice daily; consolidate to metoprolol succinate once appropriate dose has been achieved and discharge pending. -Continue apixaban 5 mg twice daily.  HFrEF secondary to nonischemic cardiomyopathy cardiomyopathy  Catheterization yesterday showed nonobstructive CAD with normal LVEDP and moderately reduced LVEF. -Maintain net even fluid balance. -Increase metoprolol tartrate to 37.5 mg twice daily with plans to transition to metoprolol succinate prior to discharge. -Add losartan 12.5 mg daily.  Acute kidney injury and obstructive uropathy: Creatinine has normalized. -Challenge with low-dose ARB, as above. -Ongoing management of obstructive uropathy per internal medicine and urology.  Sepsis with Klebsiella bacteremia: -Per IM and ID.    For questions or updates, please contact [place] Please consult [email or web address] for contact info under [facility].     Signed, Nelva Bush, MD  06/11/2021, 1:04 PM

## 2021-06-12 DIAGNOSIS — B961 Klebsiella pneumoniae [K. pneumoniae] as the cause of diseases classified elsewhere: Secondary | ICD-10-CM | POA: Diagnosis not present

## 2021-06-12 DIAGNOSIS — A419 Sepsis, unspecified organism: Secondary | ICD-10-CM | POA: Diagnosis not present

## 2021-06-12 DIAGNOSIS — I428 Other cardiomyopathies: Secondary | ICD-10-CM | POA: Diagnosis not present

## 2021-06-12 DIAGNOSIS — I4891 Unspecified atrial fibrillation: Secondary | ICD-10-CM | POA: Diagnosis not present

## 2021-06-12 DIAGNOSIS — N390 Urinary tract infection, site not specified: Secondary | ICD-10-CM | POA: Diagnosis not present

## 2021-06-12 DIAGNOSIS — N132 Hydronephrosis with renal and ureteral calculous obstruction: Secondary | ICD-10-CM | POA: Diagnosis not present

## 2021-06-12 DIAGNOSIS — R7881 Bacteremia: Secondary | ICD-10-CM | POA: Diagnosis not present

## 2021-06-12 LAB — PROCALCITONIN: Procalcitonin: 0.61 ng/mL

## 2021-06-12 LAB — MAGNESIUM: Magnesium: 1.9 mg/dL (ref 1.7–2.4)

## 2021-06-12 LAB — CBC
HCT: 38.2 % — ABNORMAL LOW (ref 39.0–52.0)
Hemoglobin: 12.8 g/dL — ABNORMAL LOW (ref 13.0–17.0)
MCH: 29.7 pg (ref 26.0–34.0)
MCHC: 33.5 g/dL (ref 30.0–36.0)
MCV: 88.6 fL (ref 80.0–100.0)
Platelets: 259 K/uL (ref 150–400)
RBC: 4.31 MIL/uL (ref 4.22–5.81)
RDW: 14.7 % (ref 11.5–15.5)
WBC: 13.7 K/uL — ABNORMAL HIGH (ref 4.0–10.5)
nRBC: 0 % (ref 0.0–0.2)

## 2021-06-12 LAB — BASIC METABOLIC PANEL WITH GFR
Anion gap: 6 (ref 5–15)
BUN: 23 mg/dL (ref 8–23)
CO2: 26 mmol/L (ref 22–32)
Calcium: 8.1 mg/dL — ABNORMAL LOW (ref 8.9–10.3)
Chloride: 106 mmol/L (ref 98–111)
Creatinine, Ser: 0.83 mg/dL (ref 0.61–1.24)
GFR, Estimated: >60 mL/min
Glucose, Bld: 76 mg/dL (ref 70–99)
Potassium: 4 mmol/L (ref 3.5–5.1)
Sodium: 138 mmol/L (ref 135–145)

## 2021-06-12 MED ORDER — SULFAMETHOXAZOLE-TRIMETHOPRIM 800-160 MG PO TABS
1.0000 | ORAL_TABLET | Freq: Two times a day (BID) | ORAL | Status: DC
  Administered 2021-06-12 – 2021-06-13 (×2): 1 via ORAL
  Filled 2021-06-12 (×3): qty 1

## 2021-06-12 MED ORDER — METOPROLOL SUCCINATE ER 100 MG PO TB24
100.0000 mg | ORAL_TABLET | Freq: Every day | ORAL | Status: DC
  Administered 2021-06-12 – 2021-06-13 (×2): 100 mg via ORAL
  Filled 2021-06-12 (×2): qty 1

## 2021-06-12 NOTE — Progress Notes (Signed)
   Date of Admission:  06/05/2021    ID: Brandon Marks is a 65 y.o. male Principal Problem:   Sepsis secondary to UTI [facility]) Active Problems:   Hyperlipidemia   Essential hypertension   Obesity (BMI 30-39.9)   Severe sepsis with septic shock (HCC)   Left ureteral calculus   Atrial fibrillation with rapid ventricular response (HCC)   Acute systolic heart failure (HCC)   NICM (nonischemic cardiomyopathy) (HCC)    Subjective: Pt doing better No fever Medications:   apixaban  5 mg Oral BID   atorvastatin  40 mg Oral Daily   losartan  12.5 mg Oral Daily   metoprolol succinate  100 mg Oral Daily   sodium chloride flush  3 mL Intravenous Q12H   sodium chloride flush  3 mL Intravenous Q12H    Objective: Vital signs in last 24 hours: Temp:  [98 F (36.7 C)-98.8 F (37.1 C)] 98 F (36.7 C) (11/16 1515) Pulse Rate:  [58-96] 90 (11/16 1515) Resp:  [17-20] 17 (11/16 1515) BP: (117-131)/(79-90) 124/80 (11/16 1515) SpO2:  [96 %-99 %] 99 % (11/16 1515) Weight:  [106.3 kg] 106.3 kg (11/16 0457)  PHYSICAL EXAM:  General: Alert, cooperative, no distress,  Lungs: Clear to auscultation bilaterally. No Wheezing or Rhonchi. No rales. Heart: irregular. Abdomen: Soft, non-tender,not distended. Bowel sounds normal. No masses Extremities: atraumatic, no cyanosis. No edema. No clubbing Skin: No rashes or lesions. Or bruising Lymph: Cervical, supraclavicular normal. Neurologic: Grossly non-focal  Lab Results Recent Labs    06/11/21 0452 06/12/21 0331  WBC 14.0* 13.7*  HGB 13.6 12.8*  HCT 40.2 38.2*  NA 139 138  K 4.1 4.0  CL 108 106  CO2 27 26  BUN 26* 23  CREATININE 0.84 0.83   Liver Panel No results for input(s): PROT, ALBUMIN, AST, ALT, ALKPHOS, BILITOT, BILIDIR, IBILI in the last 72 hours. Microbiology:  Studies/Results: US RENAL  Result Date: 06/11/2021 CLINICAL DATA:  Left hydronephrosis EXAM: RENAL / URINARY TRACT ULTRASOUND COMPLETE COMPARISON:  CT done on  06/05/2021 FINDINGS: Right Kidney: Renal measurements: 11.9 x 5.1 x 7.2 cm = volume: 227.6 mL. Echogenicity within normal limits. No mass or hydronephrosis visualized. Left Kidney: Renal measurements: 12.1 x 7.1 x 5.8 cm = volume: 260.3 mL. Echogenicity within normal limits. No mass or hydronephrosis visualized. Bladder: Urinary bladder is not distended. Left ureteral stent is noted. Both ureteral jets were observed. Other: None. IMPRESSION: There is interval resolution of left hydronephrosis. Left ureteral stent is noted. Electronically Signed   By: Elmer Picker M.D.   On: 06/11/2021 09:26     Assessment/Plan: ?Septic shock due to klebsiella bacteremia and complicated UTI    Left hydroueteronephrosis due to PUJ obstruction with stone S/p stent placement Leucocytosis improving hydronephrosis has resolved Change cefazolin to bactrim to complete a total of 2 weeks 06/20/21  consider stone removal while on antibiotics   Afib rate controlled Increase in troponin due to demand ischemia Followed by Cards   AKI - has resolved     Ventriculomegaly- with NPH followed at [facility]- [place] postponed due to afib   H/o rt sided schwannoma resection complicated by CSF leak and repair June 2021   H/o gastric bypass  Discussed the management with patient. ID will sign off- call if needed

## 2021-06-12 NOTE — Progress Notes (Signed)
Progress Note  Patient Name: Brandon Marks Date of Encounter: 06/12/2021  [facility] Cardiologist: Sanda Klein, MD   Subjective   Afib rates in the 80s, occasional elevation to 130s. He denies chest ain. Urine appears to be bloody. Hgb 13.5>12.8, may need to hold Eliquis.   Inpatient Medications    Scheduled Meds:  apixaban  5 mg Oral BID   atorvastatin  40 mg Oral Daily   losartan  12.5 mg Oral Daily   metoprolol tartrate  37.5 mg Oral BID   sodium chloride flush  3 mL Intravenous Q12H   sodium chloride flush  3 mL Intravenous Q12H   Continuous Infusions:  sodium chloride 10 mL/hr at 06/12/21 0805    ceFAZolin (ANCEF) IV 2 g (06/12/21 0603)   PRN Meds: sodium chloride, acetaminophen, docusate sodium, ondansetron (ZOFRAN) IV, polyethylene glycol, sodium chloride flush   Vital Signs    Vitals:   06/11/21 2305 06/12/21 0444 06/12/21 0457 06/12/21 0741  BP: 131/83 130/81  121/79  Pulse: (!) 58 91  85  Resp: 18 18  18   Temp: 98.6 F (37 C) 98.3 F (36.8 C)  98.1 F (36.7 C)  TempSrc:  Oral  Oral  SpO2: 96% 97%  97%  Weight:   106.3 kg   Height:        Intake/Output Summary (Last 24 hours) at 06/12/2021 1052 Last data filed at 06/12/2021 0900 Gross per 24 hour  Intake 1033.56 ml  Output 1700 ml  Net -666.44 ml   Last 3 Weights 06/12/2021 06/10/2021 06/09/2021  Weight (lbs) 234 lb 5.6 oz 245 lb 9.5 oz 243 lb 13.3 oz  Weight (kg) 106.3 kg 111.4 kg 110.6 kg      Telemetry    Aifb HR 80s, occasionall 130s, possible Vfib - Personally Reviewed  ECG    No new - Personally Reviewed  Physical Exam   GEN: No acute distress.   Neck: No JVD Cardiac: Irreg Irreg, no murmurs, rubs, or gallops.  Respiratory: Clear to auscultation bilaterally. GI: Soft, nontender, non-distended  MS: No edema; No deformity. Neuro:  Nonfocal  Psych: Normal affect   Labs    High Sensitivity Troponin:   Recent Labs  Lab 06/05/21 1350 06/05/21 1552  TROPONINIHS  109* 102*     Chemistry Recent Labs  Lab 06/05/21 1350 06/05/21 1811 06/07/21 0414 06/08/21 0639 06/09/21 0615 06/10/21 0359 06/11/21 0452 06/12/21 0331  NA 136   < > 137 138   < > 137 139 138  K 3.7   < > 3.8 3.7   < > 4.2 4.1 4.0  CL 105   < > 109 105   < > 107 108 106  CO2 17*   < > 21* 26   < > 24 27 26   GLUCOSE 90   < > 90 78   < > 79 77 76  BUN 39*   < > 66* 57*   < > 36* 26* 23  CREATININE 2.47*   < > 2.42* 1.59*   < > 0.90 0.84 0.83  CALCIUM 8.4*   < > 7.9* 8.1*   < > 8.0* 8.2* 8.1*  MG  --    < > 2.3 2.3  --   --   --  1.9  PROT 5.9*  --   --   --   --   --   --   --   ALBUMIN 3.1*  --   --   --   --   --   --   --  AST 36  --   --   --   --   --   --   --   ALT 16  --   --   --   --   --   --   --   ALKPHOS 114  --   --   --   --   --   --   --   BILITOT 1.6*  --   --   --   --   --   --   --   GFRNONAA 28*   < > 29* 47*   < > >60 >60 >60  ANIONGAP 14   < > 7 7   < > 6 4* 6   < > = values in this interval not displayed.    Lipids No results for input(s): CHOL, TRIG, HDL, LABVLDL, LDLCALC, CHOLHDL in the last 168 hours.  Hematology Recent Labs  Lab 06/10/21 0359 06/11/21 0452 06/12/21 0331  WBC 14.6* 14.0* 13.7*  RBC 4.72 4.54 4.31  HGB 14.4 13.6 12.8*  HCT 41.5 40.2 38.2*  MCV 87.9 88.5 88.6  MCH 30.5 30.0 29.7  MCHC 34.7 33.8 33.5  RDW 14.6 14.7 14.7  PLT 150 212 259   Thyroid  Recent Labs  Lab 06/05/21 2148  TSH 1.493    BNP Recent Labs  Lab 06/05/21 1350  BNP 738.0*    DDimer No results for input(s): DDIMER in the last 168 hours.   Radiology    CARDIAC CATHETERIZATION  Result Date: 06/10/2021   There is moderate left ventricular systolic dysfunction.   LV end diastolic pressure is normal.   The left ventricular ejection fraction is 35-45% by visual estimate. 1.  Minimal luminal irregularities with no evidence of obstructive coronary artery disease. 2.  Moderately reduced LV systolic function with global hypokinesis.  Normal left  ventricular end-diastolic pressure. Recommendations: The patient has nonischemic cardiomyopathy.  Recommend medical therapy. Eliquis can be resumed later tonight.   US RENAL  Result Date: 06/11/2021 CLINICAL DATA:  Left hydronephrosis EXAM: RENAL / URINARY TRACT ULTRASOUND COMPLETE COMPARISON:  CT done on 06/05/2021 FINDINGS: Right Kidney: Renal measurements: 11.9 x 5.1 x 7.2 cm = volume: 227.6 mL. Echogenicity within normal limits. No mass or hydronephrosis visualized. Left Kidney: Renal measurements: 12.1 x 7.1 x 5.8 cm = volume: 260.3 mL. Echogenicity within normal limits. No mass or hydronephrosis visualized. Bladder: Urinary bladder is not distended. Left ureteral stent is noted. Both ureteral jets were observed. Other: None. IMPRESSION: There is interval resolution of left hydronephrosis. Left ureteral stent is noted. Electronically Signed   By: Elmer Picker M.D.   On: 06/11/2021 09:26    Cardiac Studies   TTE (06/07/2021):  1. Challenging images.   2. Left ventricular ejection fraction, by estimation, is 30 to 35%. The  left ventricle has moderately decreased function. The left ventricle  demonstrates global hypokinesis. Left ventricular diastolic parameters are  indeterminate.   3. Right ventricular systolic function is mildly reduced. The right  ventricular size is mildly enlarged.   4. Left atrial size was mildly dilated.   5. The mitral valve is normal in structure. No evidence of mitral valve  regurgitation. No evidence of mitral stenosis.   6. The aortic valve was not well visualized. Aortic valve regurgitation  is not visualized. No aortic stenosis is present.   Patient Profile     65 y.o. male with history of persistent atrial fibrillation, obesity status post gastric bypass,  vestibular schwannoma status post resection complicated by CSF leak requiring lumbar punctures/drain, hyperlipidemia, hypertension, and prior low risk stress tests, who was admitted 11/9 w/ sepsis,  AKI in the setting of L hydronephrosis/obstructive uropathy/ureteral stone s/p emergent stenting, klebsiella bacteremia, elevated HsTrop, and afib w/ RVR (likely persistent).  Echo w/ EF of 30-35%, glob HK.  Assessment & Plan    Afib with RVR - metoprolol increased to 37.6mg  BID for elevated heart rates - Continue Eliquis 5mg  BID for stroke ppx - afib rates seem to be well controlld - Urine appears to be bloody and Hgb down, may need to hold Eliquis, will defer to MD.   HFrEF NICM LVEF 30-35% - cath showed nonobstructive CAD with normal LVEDP and moderately reduced LVEF - continue metoprolol>plans to transitio to succinate prior to discharge - Losartan added>>transition to Entresto at d/c - appears euvolemic on exam  AKI Obstructive uropathy - kidney function has normalized  Sepsis with bacteremia Left pyelonephritis - resolved, off pressor support - per IM/ID  For questions or updates, please contact [facility] Please consult [email or web address] for contact info under        Signed, Omarrion Carmer Ninfa Meeker, PA-C  06/12/2021, 10:52 AM

## 2021-06-12 NOTE — TOC Progression Note (Addendum)
Transition of Care [facility]) - Progression Note    Patient Details  Name: Brandon Marks MRN: [id number] Date of Birth: 21-Dec-1955  Transition of Care [facility]) CM/SW [place], LCSW Phone Number: 06/12/2021, 2:15 PM  Clinical Narrative:   CSW spoke with pt to provide bed offers and he states he does not want to go to SNF. Now he wants to get [facility]. CSW will search for [facility] agency to service him. Pt states he has a walker already.  [facility] will service pt- HH PT and OT, they do not have RN.  Expected Discharge Plan: [place] Barriers to Discharge: Continued Medical Work up  Expected Discharge Plan and Services Expected Discharge Plan: [place] In-house Referral: NA   Post Acute Care Choice: [place] Living arrangements for the past 2 months: Single Family Home                                       Social Determinants of Health (SDOH) Interventions    Readmission Risk Interventions No flowsheet data found.

## 2021-06-12 NOTE — Progress Notes (Addendum)
[facility] Progress Note  Patient: Brandon Marks    FHL:[id number]  DOA: 06/05/2021    Date of Service: the patient was seen and examined on 06/12/2021  Brief hospital course: Past medical history of HTN CHF, OSA, HLD, A. fib, schwannoma, gastric bypass surgery.  Presents with numbness of fevers and chills and found to have septic shock secondary to pyelonephritis on left due to obstructive uropathy. Initially admitted to the ICU.  Treated aggressively.  Underwent stent placement on 11/9.  Blood cultures positive for Klebsiella and Enterococcus.  ID consulted.  Cardiology was consulted due to systolic CHF as well as A. fib underwent left heart cath on 11/14.  Assessment and Plan: Left pyelonephritis, complicated with septic shock and obstructive uropathy. Gram negative bacteremia with Klebsiella Pnae.  Resolved acute metabolic encephalopathy.  On admission presented with tachycardia and leukocytosis meeting SIRS criteria with infection Sepsis physiology has resolved, off vasopressors. Sp left ureteral stent placement urology.  Plan is for outpatient lithotripsy and cystoscopy. Still has some hematuria although improving. Infectious disease assisting with the management. Currently on cefazolin, ID will switch to Bactrim. With plan to complete 2 weeks total of antibiotics.   Acute systolic CHF EF 30 to 37%  secondary to nonischemic cardiomyopathy Status post left heart cath and coronary angiography on 06/10/2021.  It showed nonobstructive CAD with normal LVEDP and moderately reduced LVEF. Continue management as recommended by cardiology   Resolved AKI  Creatinine is back to his baseline 0.8 with GFR greater than 60.   His creatinine peaked at 2.42 with GFR of 29 on 06/07/2021.   Continue to avoid nephrotoxic agents and hypotension.   HTN , new systolic heart failure  Management per cardiology as stated above.   Dyslipidemia  Continue with atorvastatin 40 mg daily.     Physical debility Continue PT OT with assistance and fall precautions. Plan to discharge to SNF [facility] assisting with SNF placement.  Acute blood loss anemia. In the setting of hematuria. Hemoglobin around baseline 14. Currently hemoglobin around 13. Monitor.  Obesity Placing the patient at high risk of poor outcome. Body mass index is 30.09 kg/m.   Subjective: Still has hematuria.  No nausea no vomiting.  No fever no chills.  No diarrhea.  No abdominal pain no back pain.  Physical Exam: General: Appear in mild distress, no Rash; Oral Mucosa Clear, moist. no Abnormal Neck Mass Or lumps, Conjunctiva normal  Cardiovascular: S1 and S2 Present, no Murmur, Respiratory: good respiratory effort, Bilateral Air entry present and CTA, no Crackles, no wheezes Abdomen: Bowel Sound present, Soft and no tenderness Extremities: no Pedal edema Neurology: alert and oriented to time, place, and person affect appropriate. no new focal deficit Gait not checked due to patient safety concerns   Data Reviewed: My review of labs, imaging, notes and other tests is significant for leukocytosis    Disposition:  Status is: Inpatient  Remains inpatient appropriate because: Ongoing hematuria, medications being changed by cardiology and ID.   Family Communication: no family was present at bedside, at the time of interview.   DVT Prophylaxis: Place and maintain sequential compression device Start: 06/06/21 1113 SCDs Start: 06/05/21 1719 apixaban (ELIQUIS) tablet 5 mg   Time spent: 35 minutes. I reviewed all nursing notes, pharmacy notes, vitals, pertinent old records. I have discussed plan of care as described above with RN.  Author: Berle Mull  06/12/2021 7:35 PM  To reach On-call, see care teams to locate the attending and reach out via [email or web address].  Between 7PM-7AM, please contact night-coverage If you still have difficulty reaching the attending provider, please page the [facility] (Director on  Call) for [facility] on amion for assistance.

## 2021-06-12 NOTE — Progress Notes (Signed)
Physical Therapy Treatment Patient Details Name: Brandon Marks MRN: [id number] DOB: 01-04-1956 Today's Date: 06/12/2021   History of Present Illness Pt is a 65 y.o. male presenting to hospital 11/9 with not feeling well and shaking spells.  Pt admitted with obstructing L proximal ureteral calculus, AKI, sepsis, AMS, and a-fib with RVR.  S/p L ureteral stent placement 06/05/21.  PMH includes herniated disc, htn, morbid obesity, sleep apnea, B TKA, OSA on CPAP, brain tumor (removed 12/27/19), necrotizing fasciitis R buttock, gastric bypass; also h/o vestibular schwannoma status postresection complicated by CSF leak requiring lumbar puncture, drain, requiring a VP shunt not yet done.    PT Comments    Pt received upright in recliner. Agreeable to PT services. Resting HR trending in low 90's in a-fib. Supervision to stand to RW and progress amb to 180' with supervision. Pt does require cuing for RW management as pt continues to amb with RW far outside BOS. Pt displayed two, brief standing rest breaks but able to return to room with maintained, trunk flexed, shuffling gait pattern. Poor carryover with cuing for keeping RW closer to BOS and improved foot clearance. Max HR with amb recorded at 114 to low 120's BPM which is improved compared to previous session. Change in D/c rec to [facility] PT due to improved tolerance with OOB mobility.  Will benefit from [facility] PT to progress endurance, safety with LRAD with OOB mobility.     Recommendations for follow up therapy are one component of a multi-disciplinary discharge planning process, led by the attending physician.  Recommendations may be updated based on patient status, additional functional criteria and insurance authorization.  Follow Up Recommendations  Home health PT     Assistance Recommended at Discharge PRN  Equipment Recommendations  Rolling walker (2 wheels)    Recommendations for Other Services       Precautions / Restrictions  Precautions Precautions: Fall Restrictions Weight Bearing Restrictions: No     Mobility  Bed Mobility               General bed mobility comments: Received and returned to recliner Patient Response: Flat affect;Cooperative  Transfers Overall transfer level: Needs assistance Equipment used: Rolling walker (2 wheels) Transfers: Sit to/from Stand Sit to Stand: Supervision           General transfer comment: Cuing for hand placement    Ambulation/Gait Ambulation/Gait assistance: Supervision Gait Distance (Feet): 180 Feet Assistive device: Rolling walker (2 wheels) Gait Pattern/deviations: Trunk flexed;Shuffle       General Gait Details: maintains RW outside BOS despite cuing, Trunk flexed with shuffling gait noted.   Stairs             Wheelchair Mobility    Modified Rankin (Stroke Patients Only)       Balance Overall balance assessment: Needs assistance Sitting-balance support: No upper extremity supported;Feet supported Sitting balance-Leahy Scale: Good     Standing balance support: Reliant on assistive device for balance;During functional activity;Single extremity supported Standing balance-Leahy Scale: Fair Standing balance comment: Able to stand statically without need of AD                            Cognition Arousal/Alertness: Awake/alert Behavior During Therapy: Flat affect Overall Cognitive Status: Within Functional Limits for tasks assessed  Exercises      General Comments General comments (skin integrity, edema, etc.): Mxx HR with ambulation today recorded at 114  to low 120's. Resting HR low 90's in a-fib      Pertinent Vitals/Pain Pain Assessment: No/denies pain    Home Living                          Prior Function            PT Goals (current goals can now be found in the care plan section) Acute Rehab PT Goals Patient Stated Goal: to  improve strength and mobility PT Goal Formulation: With patient Time For Goal Achievement: 06/23/21 Potential to Achieve Goals: Good Progress towards PT goals: Progressing toward goals    Frequency    Min 2X/week      PT Plan Current plan remains appropriate    Co-evaluation              AM-PAC PT "6 Clicks" Mobility   Outcome Measure  Help needed turning from your back to your side while in a flat bed without using bedrails?: None Help needed moving from lying on your back to sitting on the side of a flat bed without using bedrails?: A Little Help needed moving to and from a bed to a chair (including a wheelchair)?: A Little Help needed standing up from a chair using your arms (e.g., wheelchair or bedside chair)?: A Little Help needed to walk in hospital room?: A Little Help needed climbing 3-5 steps with a railing? : A Lot 6 Click Score: 18    End of Session Equipment Utilized During Treatment: Gait belt Activity Tolerance: Patient tolerated treatment well Patient left: in chair;with call bell/phone within reach;with chair alarm set Nurse Communication: Mobility status PT Visit Diagnosis: Other abnormalities of gait and mobility (R26.89);Muscle weakness (generalized) (M62.81)     Time: 5670-1410 PT Time Calculation (min) (ACUTE ONLY): 25 min  Charges:  $Gait Training: 23-37 mins                     Salem Caster. Fairly IV, PT, DPT Physical Therapist- [facility]   06/12/2021, 4:08 PM

## 2021-06-13 ENCOUNTER — Encounter: Payer: Self-pay | Admitting: Urology

## 2021-06-13 DIAGNOSIS — I4891 Unspecified atrial fibrillation: Secondary | ICD-10-CM | POA: Diagnosis not present

## 2021-06-13 DIAGNOSIS — I5021 Acute systolic (congestive) heart failure: Secondary | ICD-10-CM | POA: Diagnosis not present

## 2021-06-13 DIAGNOSIS — N1330 Unspecified hydronephrosis: Secondary | ICD-10-CM

## 2021-06-13 DIAGNOSIS — I10 Essential (primary) hypertension: Secondary | ICD-10-CM | POA: Diagnosis not present

## 2021-06-13 LAB — BASIC METABOLIC PANEL
Anion gap: 8 (ref 5–15)
BUN: 20 mg/dL (ref 8–23)
CO2: 27 mmol/L (ref 22–32)
Calcium: 8 mg/dL — ABNORMAL LOW (ref 8.9–10.3)
Chloride: 102 mmol/L (ref 98–111)
Creatinine, Ser: 1.08 mg/dL (ref 0.61–1.24)
GFR, Estimated: >60 mL/min (ref >60)
Glucose, Bld: 76 mg/dL (ref 70–99)
Potassium: 4.2 mmol/L (ref 3.5–5.1)
Sodium: 137 mmol/L (ref 135–145)

## 2021-06-13 LAB — CBC WITH DIFFERENTIAL/PLATELET
Abs Immature Granulocytes: 0.5 10*3/uL — ABNORMAL HIGH (ref 0.00–0.07)
Basophils Absolute: 0.1 10*3/uL (ref 0.0–0.1)
Basophils Relative: 0 %
Eosinophils Absolute: 0.1 10*3/uL (ref 0.0–0.5)
Eosinophils Relative: 1 %
HCT: 38.4 % — ABNORMAL LOW (ref 39.0–52.0)
Hemoglobin: 12.9 g/dL — ABNORMAL LOW (ref 13.0–17.0)
Immature Granulocytes: 4 %
Lymphocytes Relative: 13 %
Lymphs Abs: 1.8 10*3/uL (ref 0.7–4.0)
MCH: 30.4 pg (ref 26.0–34.0)
MCHC: 33.6 g/dL (ref 30.0–36.0)
MCV: 90.4 fL (ref 80.0–100.0)
Monocytes Absolute: 1 10*3/uL (ref 0.1–1.0)
Monocytes Relative: 7 %
Neutro Abs: 10.7 10*3/uL — ABNORMAL HIGH (ref 1.7–7.7)
Neutrophils Relative %: 75 %
Platelets: 324 10*3/uL (ref 150–400)
RBC: 4.25 MIL/uL (ref 4.22–5.81)
RDW: 14.6 % (ref 11.5–15.5)
WBC: 14.2 10*3/uL — ABNORMAL HIGH (ref 4.0–10.5)
nRBC: 0 % (ref 0.0–0.2)

## 2021-06-13 MED ORDER — SULFAMETHOXAZOLE-TRIMETHOPRIM 800-160 MG PO TABS: 1.0000 | ORAL_TABLET | Freq: Two times a day (BID) | ORAL | 0 refills | Status: AC

## 2021-06-13 MED ORDER — ATORVASTATIN CALCIUM 40 MG PO TABS: 40.0000 mg | ORAL_TABLET | Freq: Every day | ORAL | 0 refills | Status: DC

## 2021-06-13 MED ORDER — METOPROLOL SUCCINATE ER 100 MG PO TB24: 100.0000 mg | ORAL_TABLET | Freq: Every day | ORAL | 0 refills | Status: DC

## 2021-06-13 MED ORDER — LOSARTAN POTASSIUM 25 MG PO TABS
12.5000 mg | ORAL_TABLET | Freq: Every day | ORAL | 0 refills | Status: DC
Start: 2021-06-14 — End: 2022-05-30

## 2021-06-13 MED ORDER — DOCUSATE SODIUM 100 MG PO CAPS
100.0000 mg | ORAL_CAPSULE | Freq: Two times a day (BID) | ORAL | 0 refills | Status: DC | PRN
Start: 2021-06-13 — End: 2023-02-23

## 2021-06-13 NOTE — Progress Notes (Signed)
Discussed discharge instructions (including medications and follow up appointments) with patient/caregiver.   Sent home paper work with patient and encouraged patient to take all medications and keep appointments as recommended.

## 2021-06-13 NOTE — Care Management Important Message (Signed)
Important Message  Patient Details  Name: Brandon Marks MRN: [id number] Date of Birth: Apr 05, 1956   Medicare Important Message Given:  Yes     Dannette Barbara 06/13/2021, 11:29 AM

## 2021-06-13 NOTE — TOC Transition Note (Addendum)
Transition of Care [facility]) - CM/SW Discharge Note   Patient Details  Name: Brandon Marks MRN: [id number] Date of Birth: [birth year]  Transition of Care [facility]) CM/SW Contact:  Beverly Sessions, RN Phone Number: 06/13/2021, 10:53 AM   Clinical Narrative:     Patient to discharge today [name] with [facility] notified of discharge RW to be delivered to room prior to discharge by [facility] with [employer].  Bedside RN notified   Update:  Patient received a rolling walker in June and would not qualify for another one.  Patient not interested in private paying states "ill just use the one Ive got"  Barriers to Discharge: Barriers Resolved   Patient Goals and CMS Choice Patient states their goals for this hospitalization and ongoing recovery are:: to get better   Choice offered to / list presented to : Patient  Discharge Placement                       Discharge Plan and Services In-house Referral: NA   Post Acute Care Choice: [place]          DME Arranged: Walker rolling DME Agency: [employer] Date DME Agency Contacted: 11/17/22discharge today [name]   Final next level of care: [place]   Representative spoke with at DME Agency: [place]: PT [place]: [facility] [place] Date [place]: 06/13/21   Representative spoke with at [place]: [place] ([place]) Interventions     Readmission Risk Interventions No flowsheet data found.

## 2021-06-13 NOTE — Progress Notes (Signed)
[facility] [facility] Posting Form   Surgery Date/Time: Date: 06/24/2021  Surgeon: Dr. Hollice Espy, MD  Surgery Location: Day Surgery  Inpt ( No  )   Outpt (Yes)   Obs ( No  )   Diagnosis: N20.1 Left Ureteral Stone  -CPT: [phone]  Surgery: Left URS/LL/Stent Exchange  Stop Anticoagulations: No  Cardiac/Medical/Pulmonary Clearance needed: Yes, Cardiology Clearance dated 11/4- okayed per Honor Loh NP with Anesthesia  *Orders entered into EPIC  Date: 06/13/21   *Case booked in EPIC  Date: 06/13/21  *Notified pt of Surgery: Date: 06/13/21  PRE-OP UA & CX: Yes, Obtained on 11/9- Patient to continue Bactrim Rx for next 2 weeks  *Placed into Prior Authorization Work Fabio Bering Date: 06/13/21   Assistant/laser/rep:No

## 2021-06-13 NOTE — Progress Notes (Signed)
Progress Note  Patient Name: Brandon Marks Date of Encounter: 06/13/2021  Primary Cardiologist: Croitoru  Subjective   No chest pain, dyspnea, or palpitations.   Inpatient Medications    Scheduled Meds:  apixaban  5 mg Oral BID   atorvastatin  40 mg Oral Daily   losartan  12.5 mg Oral Daily   metoprolol succinate  100 mg Oral Daily   sodium chloride flush  3 mL Intravenous Q12H   sodium chloride flush  3 mL Intravenous Q12H   sulfamethoxazole-trimethoprim  1 tablet Oral Q12H   Continuous Infusions:  sodium chloride 10 mL/hr at 06/12/21 1605   PRN Meds: sodium chloride, acetaminophen, docusate sodium, ondansetron (ZOFRAN) IV, polyethylene glycol, sodium chloride flush   Vital Signs    Vitals:   06/12/21 1927 06/13/21 0032 06/13/21 0500 06/13/21 0754  BP: 106/89 (!) 127/100  119/76  Pulse: 80 88  81  Resp: 18 18  18   Temp: 98 F (36.7 C) 97.7 F (36.5 C)  98 F (36.7 C)  TempSrc:      SpO2: 100% 98%  99%  Weight:   106 kg   Height:        Intake/Output Summary (Last 24 hours) at 06/13/2021 1104 Last data filed at 06/13/2021 1012 Gross per 24 hour  Intake 2287.11 ml  Output 1050 ml  Net 1237.11 ml   Filed Weights   06/10/21 0205 06/12/21 0457 06/13/21 0500  Weight: 111.4 kg 106.3 kg 106 kg    Telemetry    Afib, 80s pbm - Personally Reviewed  ECG    No new tracings - Personally Reviewed  Physical Exam   GEN: No acute distress.   Neck: No JVD. Cardiac: IRIR, no murmurs, rubs, or gallops.  Respiratory: Clear to auscultation bilaterally.  GI: Soft, nontender, non-distended.   MS: No edema; No deformity. Neuro:  Alert and oriented x 3; Nonfocal.  Psych: Normal affect.  Labs    Chemistry Recent Labs  Lab 06/11/21 0452 06/12/21 0331 06/13/21 0648  NA 139 138 137  K 4.1 4.0 4.2  CL 108 106 102  CO2 27 26 27   GLUCOSE 77 76 76  BUN 26* 23 20  CREATININE 0.84 0.83 1.08  CALCIUM 8.2* 8.1* 8.0*  GFRNONAA >60 >60 >60  ANIONGAP 4* 6 8      Hematology Recent Labs  Lab 06/11/21 0452 06/12/21 0331 06/13/21 0648  WBC 14.0* 13.7* 14.2*  RBC 4.54 4.31 4.25  HGB 13.6 12.8* 12.9*  HCT 40.2 38.2* 38.4*  MCV 88.5 88.6 90.4  MCH 30.0 29.7 30.4  MCHC 33.8 33.5 33.6  RDW 14.7 14.7 14.6  PLT 212 259 324    Cardiac EnzymesNo results for input(s): TROPONINI in the last 168 hours. No results for input(s): TROPIPOC in the last 168 hours.   BNPNo results for input(s): BNP, PROBNP in the last 168 hours.   DDimer No results for input(s): DDIMER in the last 168 hours.   Radiology    No results found.  Cardiac Studies   LHC 06/10/2021:   There is moderate left ventricular systolic dysfunction.   LV end diastolic pressure is normal.   The left ventricular ejection fraction is 35-45% by visual estimate.   1.  Minimal luminal irregularities with no evidence of obstructive coronary artery disease. 2.  Moderately reduced LV systolic function with global hypokinesis.  Normal left ventricular end-diastolic pressure.   Recommendations: The patient has nonischemic cardiomyopathy.  Recommend medical therapy. Eliquis can be resumed  later tonight. __________  2D echo 06/07/2021: 1. Challenging images.   2. Left ventricular ejection fraction, by estimation, is 30 to 35%. The  left ventricle has moderately decreased function. The left ventricle  demonstrates global hypokinesis. Left ventricular diastolic parameters are  indeterminate.   3. Right ventricular systolic function is mildly reduced. The right  ventricular size is mildly enlarged.   4. Left atrial size was mildly dilated.   5. The mitral valve is normal in structure. No evidence of mitral valve  regurgitation. No evidence of mitral stenosis.   6. The aortic valve was not well visualized. Aortic valve regurgitation  is not visualized. No aortic stenosis is present.  Patient Profile     65 y.o. male with history of persistent atrial fibrillation, obesity status post  gastric bypass, vestibular schwannoma status post resection complicated by CSF leak requiring lumbar punctures/drain, hyperlipidemia, hypertension, and prior low risk stress tests, who was admitted 11/9 with sepsis and AKI in the setting of left hydronephrosis/obstructive uropathy/ureteral stone s/p emergent stenting, klebsiella bacteremia, elevated HsTrop, Afib w/ RVR (likely persistent), and HFrEF.    Assessment & Plan    1. Afib: -Likely persistent -Ventricular rates improved -Continue Toprol -CHADS2VASc at least 3 (CHF, HTN, age x 1) -Eliquis -Once his acute illness is improved/treated and he has been adequately anticoagulated without interruption for at least 3-4 weeks, look to pursue outpatient DCCV  2. 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 Sepsis with obstructive uropathy/preprocedure cardiac risk stratification: -Moderate risk for noncardiac procedure -Functional status is difficult to assess at this time given his acute illness/hospital admission -He may proceed with noncardiac procedure at an overall moderate risk without further cardiac testing -Eliquis should be held for 2 days prior to procedure and resumed as soon as safely possible    For questions or updates, please contact [place] Please consult [email or web address] for contact info under Cardiology/STEMI.    Signed, Christell Faith, PA-C [place] Pager: [phone] 06/13/2021, 11:04 AM

## 2021-06-14 ENCOUNTER — Telehealth: Payer: Self-pay

## 2021-06-14 NOTE — Telephone Encounter (Signed)
Transition Care Management Follow-up Telephone Call   Date of discharge and from where: [facility] on 06/13/2021 How have you been since you were released from the hospital? ok Any questions or concerns? No questions/concerns reported.  Items Reviewed: Did the pt receive and understand the discharge instructions provided? have the instructions and have no questions.  Medications obtained and verified? He said that they have the medication list/ medications were reviewed. He said that he has all of the medications and they have no questions.  Any new allergies since your discharge? None reported  Do you have support at home? No lives by self Other (ie: DME, Home Health, etc)       Post Acute Care Choice: [place]          DME Arranged: [place] rolling DME Agency: [employer] Date DME Agency Contacted: 11/17/22discharge today [name]      Functional Questionnaire: (I = Independent and D = Dependent) ADL's:  Independent.        Follow up appointments reviewed:   PCP Hospital f/u appt confirmed? Dr Wynetta Emery 07/26/2021   Specialist Hospital f/u appt confirmed? scheduled at this time  Are transportation arrangements needed? have transportation   If their condition worsens, is the pt aware to call  their PCP or go to the ED? Yes.Made pt aware if condition worsen or start experiencing rapid weight gain, chest pain, diff breathing, SOB, high fevers, or bleading to refer imediately to ED for further evaluation.  Was the patient provided with contact information for the PCP's office or ED? He has the phone number  Was the pt encouraged to call back with questions or concerns?yes

## 2021-06-16 LAB — CULTURE, BLOOD (ROUTINE X 2)
Culture: NO GROWTH
Culture: NO GROWTH
Special Requests: ADEQUATE

## 2021-06-19 ENCOUNTER — Encounter: Admission: RE | Admit: 2021-06-19 | Payer: Medicare Other | Source: Ambulatory Visit

## 2021-06-19 NOTE — Discharge Summary (Addendum)
Physician Discharge Summary   Patient name: Brandon Marks  Admit date:     06/05/2021  Discharge date: 06/13/2021   Discharge Physician: Berle Mull   PCP: Ladell Pier, MD   Recommendations at discharge: follow up with PCP in 1 week  Discharge Diagnoses Principal Problem:   Sepsis secondary to UTI [facility]) Active Problems:   Hyperlipidemia   Essential hypertension   Obesity (BMI 30-39.9)   Severe sepsis with septic shock (HCC)   Left ureteral calculus   Atrial fibrillation with rapid ventricular response (HCC)   Acute systolic heart failure (HCC)   NICM (nonischemic cardiomyopathy) [facility])  Hospital Course   Past medical history of HTN CHF, OSA, HLD, A. fib, schwannoma, gastric bypass surgery.  Presents with numbness of fevers and chills and found to have septic shock secondary to pyelonephritis on left due to obstructive uropathy. Initially admitted to the ICU.  Treated aggressively.  Underwent stent placement on 11/9.  Blood cultures positive for Klebsiella and Enterococcus.  ID consulted. Cardiology was consulted due to systolic CHF as well as A. fib underwent left heart cath on 11/14.  Left pyelonephritis, complicated with septic shock and obstructive uropathy. Gram negative bacteremia with Klebsiella Pnae.  Resolved acute metabolic encephalopathy.  On admission presented with tachycardia and leukocytosis meeting SIRS criteria with infection Sepsis physiology has resolved, off vasopressors. Sp left ureteral stent placement urology.  Plan is for outpatient lithotripsy and cystoscopy. Still has some hematuria although improving. Infectious disease assisting with the management. Currently on cefazolin, ID will switch to Bactrim. With plan to complete 2 weeks total of antibiotics.   Acute systolic CHF EF 30 to 75%  secondary to nonischemic cardiomyopathy Status post left heart cath and coronary angiography on 06/10/2021.  It showed nonobstructive CAD with normal LVEDP  and moderately reduced LVEF. Continue management as recommended by cardiology   Resolved AKI  Creatinine is back to his baseline 0.8 with GFR greater than 60.   His creatinine peaked at 2.42 with GFR of 29 on 06/07/2021.   Continue to avoid nephrotoxic agents and hypotension.   HTN , new systolic heart failure  Management per cardiology as stated above.   Dyslipidemia  Continue with atorvastatin 40 mg daily.    Physical debility Continue PT OT with assistance and fall precautions. Plan to discharge to SNF [facility] assisting with SNF placement.   Acute blood loss anemia. In the setting of hematuria. Hemoglobin around baseline 14. Currently hemoglobin around 13. Monitor.   Obesity Placing the patient at high risk of poor outcome. Body mass index is 30 kg/m.   Procedures performed: left ureteral stent placement   Condition at discharge: good  Exam General: Appear in mild distress, no Rash; Oral Mucosa Clear, moist. no Abnormal Neck Mass Or lumps, Conjunctiva normal  Cardiovascular: S1 and S2 Present, no Murmur, Respiratory: good respiratory effort, Bilateral Air entry present and CTA, no Crackles, no wheezes Abdomen: Bowel Sound present, Soft and no tenderness Extremities: no Pedal edema Neurology: alert and oriented to time, place, and person affect appropriate. no new focal deficit Gait not checked due to patient safety concerns   Disposition: Home  Discharge time: greater than 30 minutes.  Follow-up Information     Ladell Pier, MD. Schedule an appointment as soon as possible for a visit on 07/26/2021.   Specialty: Internal Medicine Why: @ 9:30am Patient will be added to the waitlist. Contact information: [address] [place] [address] [phone]  Hollice Espy, MD. Schedule an appointment as soon as possible for a visit on 07/30/2021.   Specialty: Urology Why: @ 10:30am Contact information: [place] [phone]         [place]. Schedule an appointment as soon as possible for a visit on 07/03/2021.   Specialty: Cardiology Why: @ 2:45pm Patient will see the PA at the [facility] office. Contact information: [address], [place] [phone]                Allergies as of 06/13/2021       Reactions   Codeine Nausea And Vomiting   Oxycodone Other (See Comments)   Upset GI        Medication List     STOP taking these medications    diltiazem 180 MG 24 hr capsule Commonly known as: Cardizem CD       TAKE these medications    acetaminophen 325 MG tablet Commonly known as: TYLENOL as needed for headache.   apixaban 5 MG Tabs tablet Commonly known as: ELIQUIS Take 1 tablet (5 mg total) by mouth 2 (two) times daily.   atorvastatin 40 MG tablet Commonly known as: LIPITOR Take 1 tablet (40 mg total) by mouth daily.   cyanocobalamin 1000 MCG tablet Take 1 tablet by mouth daily.   vitamin B-12 500 MCG tablet Commonly known as: CYANOCOBALAMIN Take 500 mcg by mouth daily.   docusate sodium 100 MG capsule Commonly known as: COLACE Take 1 capsule (100 mg total) by mouth 2 (two) times daily as needed for mild constipation.   furosemide 20 MG tablet Commonly known as: LASIX Take 1 tablet (20 mg total) by mouth daily as needed for edema (for swelling in legs/ankle).   losartan 25 MG tablet Commonly known as: COZAAR Take 0.5 tablets (12.5 mg total) by mouth daily.   Magnesium 250 MG Tabs Take by mouth daily.   metoprolol succinate 100 MG 24 hr tablet Commonly known as: TOPROL-XL Take 1 tablet (100 mg total) by mouth daily. Take with or immediately following a meal.   sulfamethoxazole-trimethoprim 800-160 MG tablet Commonly known as: BACTRIM DS Take 1 tablet by mouth 2 (two) times daily for 7 days.   vitamin C 500 MG tablet Commonly known as: ASCORBIC ACID Take 500 mg  by mouth daily.        CT ABDOMEN PELVIS WO CONTRAST  Result Date: 06/05/2021 CLINICAL DATA:  Abdominal pain, acute, nonlocalized. Heart failure. Anticoagulated. EXAM: CT ABDOMEN AND PELVIS WITHOUT CONTRAST TECHNIQUE: Multidetector CT imaging of the abdomen and pelvis was performed following the standard protocol without IV contrast. COMPARISON:  01/01/2018 FINDINGS: Lower chest: Mild atelectasis or scarring in both lung bases. Mild cardiomegaly. Hepatobiliary: Liver parenchyma appears normal without contrast. The gallbladder is somewhat distended. No calcified gallstones are seen. Pancreas: Normal Spleen: Normal Adrenals/Urinary Tract: Adrenal glands are prominent, possibly hyperplastic. The right kidney is normal. No cyst, mass, stone or hydronephrosis. There is hydronephrosis of the left kidney with the ureter dilated to the level of the lower pole of the kidney where there is a 13 mm stone causing the obstruction. No stone distal to that. No stone in the bladder. Stomach/Bowel: Previous gastric bypass. No acute bowel pathology is seen. Vascular/Lymphatic: Aortic atherosclerosis. IVC is normal. No adenopathy. Reproductive: Normal Other: No free fluid or air. Musculoskeletal: Chronic spinal degenerative changes with solid bridging osteophytes suggesting diffuse idiopathic skeletal hyperostosis. IMPRESSION: Acute  hydronephrosis on the left. 13 mm stone in the proximal left ureter causing the obstruction. Previous gastric bypass. Aortic Atherosclerosis (ICD10-I70.0). Electronically Signed   By: Delaughter Chimes M.D.   On: 06/05/2021 20:31   CT HEAD WO CONTRAST (5MM)  Result Date: 06/05/2021 CLINICAL DATA:  Mental status changes. Heart failure. History of acoustic neuroma. EXAM: CT HEAD WITHOUT CONTRAST TECHNIQUE: Contiguous axial images were obtained from the base of the skull through the vertex without intravenous contrast. COMPARISON:  MRI 12/17/2020.  CT 10/27/2019. 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 Fat packing along the surgical approach. No CT evidence of residual mass. MRI would be more sensitive. Cerebral hemispheres do not show any acute or focal finding otherwise. There is ventriculomegaly, new when compared to CT scan of April 2021 but similar to the MRI in May of this year. Vascular: No abnormal vascular finding. 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 No evidence of residual or recurrent mass. Hydrocephalus, similar to the study of 12/17/2020 and newly seen since April of 2021. This could be significant. Has this been considered? Electronically Signed   By: Worden Chimes M.D.   On: 06/05/2021 20:36   CARDIAC CATHETERIZATION  Result Date: 06/10/2021   There is moderate left ventricular systolic dysfunction.   LV end diastolic pressure is normal.   The left ventricular ejection fraction is 35-45% by visual estimate. 1.  Minimal luminal irregularities with no evidence of obstructive coronary artery disease. 2.  Moderately reduced LV systolic function with global hypokinesis.  Normal left ventricular end-diastolic pressure. Recommendations: The patient has nonischemic cardiomyopathy.  Recommend medical therapy. Eliquis can be resumed later tonight.   US RENAL  Result Date: 06/11/2021 CLINICAL DATA:  Left hydronephrosis EXAM: RENAL / URINARY TRACT ULTRASOUND COMPLETE COMPARISON:  CT done on 06/05/2021 FINDINGS: Right Kidney: Renal measurements: 11.9 x 5.1 x 7.2 cm = volume: 227.6 mL. Echogenicity within normal limits. No mass or hydronephrosis visualized. Left Kidney: Renal measurements: 12.1 x 7.1 x 5.8 cm = volume: 260.3 mL. Echogenicity within normal limits. No mass or hydronephrosis visualized. Bladder: Urinary bladder is not distended. Left ureteral stent is noted. Both ureteral jets were observed. Other: None. IMPRESSION:  There is interval resolution of left hydronephrosis. Left ureteral stent is noted. Electronically Signed   By: Elmer Picker M.D.   On: 06/11/2021 09:26   DG Chest Portable 1 View  Result Date: 06/05/2021 CLINICAL DATA:  Weakness. EXAM: PORTABLE CHEST 1 VIEW COMPARISON:  January 08, 2018. FINDINGS: Stable cardiomegaly. Both lungs are clear. The visualized skeletal structures are unremarkable. IMPRESSION: No active disease. Electronically Signed   By: Marijo Conception M.D.   On: 06/05/2021 14:28   DG OR UROLOGY CYSTO IMAGE ([place])  Result Date: 06/05/2021 There is no interpretation for this exam.  This order is for images obtained during a surgical procedure.  Please See "Surgeries" Tab for more information regarding the procedure.   ECHOCARDIOGRAM COMPLETE  Result Date: 06/07/2021    ECHOCARDIOGRAM REPORT   Patient Name:   CASE VASSELL Date of Exam: 06/07/2021 Medical Rec #:  [id number]       Height:       74.0 in Accession #:    [id number]      Weight:       241.4 lb Date of Birth:  Apr 14, 1956       BSA:  2.354 m Patient Age:    6 years        BP:           95/59 mmHg Patient Gender: M               HR:           109 bpm. Exam Location:  [facility] Procedure: 2D Echo, Color Doppler and Cardiac Doppler Indications:     Atrial Fibrillation I48.91  History:         Patient has no prior history of Echocardiogram examinations.                  Risk Factors:Hypertension, Dyslipidemia and Sleep Apnea. PAF.  Sonographer:     Sherrie Sport Referring Phys:  [id number] [address]RI[address] RONALD BERGE Diagnosing Phys: Ida Rogue MD  Sonographer Comments: Technically challenging study due to limited acoustic windows and suboptimal apical window. Best view is subcostal. IMPRESSIONS  1. Challenging images.  2. Left ventricular ejection fraction, by estimation, is 30 to 35%. The left ventricle has moderately decreased function. The left ventricle demonstrates global hypokinesis. Left ventricular diastolic parameters  are indeterminate.  3. Right ventricular systolic function is mildly reduced. The right ventricular size is mildly enlarged.  4. Left atrial size was mildly dilated.  5. The mitral valve is normal in structure. No evidence of mitral valve regurgitation. No evidence of mitral stenosis.  6. The aortic valve was not well visualized. Aortic valve regurgitation is not visualized. No aortic stenosis is present. FINDINGS  Left Ventricle: Left ventricular ejection fraction, by estimation, is 30 to 35%. The left ventricle has moderately decreased function. The left ventricle demonstrates global hypokinesis. The left ventricular internal cavity size was normal in size. There is no left ventricular hypertrophy. Left ventricular diastolic parameters are indeterminate. Right Ventricle: The right ventricular size is mildly enlarged. No increase in right ventricular wall thickness. Right ventricular systolic function is mildly reduced. Left Atrium: Left atrial size was mildly dilated. Right Atrium: Right atrial size was normal in size. Pericardium: There is no evidence of pericardial effusion. Mitral Valve: The mitral valve is normal in structure. No evidence of mitral valve regurgitation. No evidence of mitral valve stenosis. Tricuspid Valve: The tricuspid valve is normal in structure. Tricuspid valve regurgitation is not demonstrated. No evidence of tricuspid stenosis. Aortic Valve: The aortic valve was not well visualized. Aortic valve regurgitation is not visualized. No aortic stenosis is present. Aortic valve mean gradient measures 1.0 mmHg. Aortic valve peak gradient measures 2.1 mmHg. Aortic valve area, by VTI measures 3.95 cm. Pulmonic Valve: The pulmonic valve was normal in structure. Pulmonic valve regurgitation is not visualized. No evidence of pulmonic stenosis. Aorta: The aortic root is normal in size and structure. Venous: The pulmonary veins were not well visualized. The inferior vena cava was not well visualized.  The inferior vena cava is normal in size with greater than 50% respiratory variability, suggesting right atrial pressure of 3 mmHg. IAS/Shunts: No atrial level shunt detected by color flow Doppler.  LEFT VENTRICLE PLAX 2D LVIDd:         4.50 cm   Diastology LVIDs:         3.90 cm   LV e' medial:    2.18 cm/s LV PW:         1.40 cm   LV E/e' medial:  26.9 LV IVS:        0.90 cm   LV e' lateral:   5.44 cm/s LVOT diam:  2.10 cm   LV E/e' lateral: 10.8 LV SV:         36 LV SV Index:   15 LVOT Area:     3.46 cm  RIGHT VENTRICLE RV Basal diam:  4.50 cm RV S prime:     7.29 cm/s TAPSE (M-mode): 3.7 cm LEFT ATRIUM              Index        RIGHT ATRIUM           Index LA diam:        4.40 cm  1.87 cm/m   RA Area:     22.10 cm LA Vol (A2C):   120.0 ml 50.97 ml/m  RA Volume:   73.30 ml  31.13 ml/m LA Vol (A4C):   103.0 ml 43.75 ml/m LA Biplane Vol: 113.0 ml 48.00 ml/m  AORTIC VALVE AV Area (Vmax):    2.84 cm AV Area (Vmean):   2.92 cm AV Area (VTI):     3.95 cm AV Vmax:           72.45 cm/s AV Vmean:          48.500 cm/s AV VTI:            0.091 m AV Peak Grad:      2.1 mmHg AV Mean Grad:      1.0 mmHg LVOT Vmax:         59.40 cm/s LVOT Vmean:        40.900 cm/s LVOT VTI:          0.104 m LVOT/AV VTI ratio: 1.14  AORTA Ao Root diam: 2.80 cm MITRAL VALVE               TRICUSPID VALVE MV Area (PHT): 3.91 cm    TR Peak grad:   12.8 mmHg MV Decel Time: 194 msec    TR Vmax:        179.00 cm/s MV E velocity: 58.70 cm/s                            SHUNTS                            Systemic VTI:  0.10 m                            Systemic Diam: 2.10 cm Ida Rogue MD Electronically signed by Ida Rogue MD Signature Date/Time: 06/07/2021/2:37:03 PM    Final    Results for orders placed or performed during the hospital encounter of 06/05/21  Resp Panel by RT-PCR (Flu A&B, Covid) Nasopharyngeal Swab     Status: None   Collection Time: 06/05/21  2:44 PM   Specimen: Nasopharyngeal Swab; Nasopharyngeal(NP) swabs in  vial transport medium  Result Value Ref Range Status   SARS Coronavirus 2 by RT PCR NEGATIVE NEGATIVE Final    Comment: (NOTE) SARS-CoV-2 target nucleic acids are NOT DETECTED.  The SARS-CoV-2 RNA is generally detectable in upper respiratory specimens during the acute phase of infection. The lowest concentration of SARS-CoV-2 viral copies this assay can detect is 138 copies/mL. A negative result does not preclude SARS-Cov-2 infection and should not be used as the sole basis for treatment or other patient management decisions. A negative result may occur with  improper specimen collection/handling, submission of specimen other  than nasopharyngeal swab, presence of viral mutation(s) within the areas targeted by this assay, and inadequate number of viral copies(<138 copies/mL). A negative result must be combined with clinical observations, patient history, and epidemiological information. The expected result is Negative.  Fact Sheet for Patients:  EntrepreneurPulse.com.au  Fact Sheet for Healthcare Providers:  IncredibleEmployment.be  This test is no t yet approved or cleared by the [place] FDA and  has been authorized for detection and/or diagnosis of SARS-CoV-2 by FDA under an Emergency Use Authorization (EUA). This EUA will remain  in effect (meaning this test can be used) for the duration of the COVID-19 declaration under Section 564(b)(1) of the Act, 21 U.S.C.section 360bbb-3(b)(1), unless the authorization is terminated  or revoked sooner.       Influenza A by PCR NEGATIVE NEGATIVE Final   Influenza B by PCR NEGATIVE NEGATIVE Final    Comment: (NOTE) The Xpert Xpress SARS-CoV-2/FLU/RSV plus assay is intended as an aid in the diagnosis of influenza from Nasopharyngeal swab specimens and should not be used as a sole basis for treatment. Nasal washings and aspirates are unacceptable for Xpert Xpress SARS-CoV-2/FLU/RSV testing.  Fact  Sheet for Patients: EntrepreneurPulse.com.au  Fact Sheet for Healthcare Providers: IncredibleEmployment.be  This test is not yet approved or cleared by the [place] FDA and has been authorized for detection and/or diagnosis of SARS-CoV-2 by FDA under an Emergency Use Authorization (EUA). This EUA will remain in effect (meaning this test can be used) for the duration of the COVID-19 declaration under Section 564(b)(1) of the Act, 21 U.S.C. section 360bbb-3(b)(1), unless the authorization is terminated or revoked.  Performed at [facility], [address] [place], [place] [address]   Blood culture (routine x 2)     Status: Abnormal   Collection Time: 06/05/21  3:49 PM   Specimen: BLOOD  Result Value Ref Range Status   Specimen Description   Final    BLOOD LEFT ANTECUBITAL Performed at [facility], [address], [place], [place] [address]    Special Requests   Final    BOTTLES DRAWN AEROBIC AND ANAEROBIC Blood Culture adequate volume Performed at [facility], [address] [place], [place] [address]    Culture  Setup Time   Final    GRAM NEGATIVE RODS IN BOTH AEROBIC AND ANAEROBIC BOTTLES Organism ID to follow CRITICAL RESULT CALLED TO, READ BACK BY AND VERIFIED WITH: JASON ROBBINS@[email or web address]  06/06/21 RH Performed at [facility], [address] [place], [place] [address]    Culture KLEBSIELLA PNEUMONIAE (A)  Final   Report Status 06/08/2021 FINAL  Final   Organism ID, Bacteria KLEBSIELLA PNEUMONIAE  Final      Susceptibility   Klebsiella pneumoniae - MIC*    AMPICILLIN >=32 RESISTANT Resistant     CEFAZOLIN <=4 SENSITIVE Sensitive     CEFEPIME <=0.12 SENSITIVE Sensitive     CEFTAZIDIME <=1 SENSITIVE Sensitive     CEFTRIAXONE <=0.25 SENSITIVE Sensitive     CIPROFLOXACIN <=0.25 SENSITIVE Sensitive     GENTAMICIN <=1 SENSITIVE Sensitive     IMIPENEM 1 SENSITIVE Sensitive     TRIMETH/SULFA  <=20 SENSITIVE Sensitive     AMPICILLIN/SULBACTAM 4 SENSITIVE Sensitive     PIP/TAZO <=4 SENSITIVE Sensitive     * KLEBSIELLA PNEUMONIAE  Blood Culture ID Panel (Reflexed)     Status: Abnormal   Collection Time: 06/05/21  3:49 PM  Result Value Ref Range Status   Enterococcus faecalis NOT DETECTED NOT DETECTED Final  Enterococcus Faecium NOT DETECTED NOT DETECTED Final   Listeria monocytogenes NOT DETECTED NOT DETECTED Final   Staphylococcus species NOT DETECTED NOT DETECTED Final   Staphylococcus aureus (BCID) NOT DETECTED NOT DETECTED Final   Staphylococcus epidermidis NOT DETECTED NOT DETECTED Final   Staphylococcus lugdunensis NOT DETECTED NOT DETECTED Final   Streptococcus species NOT DETECTED NOT DETECTED Final   Streptococcus agalactiae NOT DETECTED NOT DETECTED Final   Streptococcus pneumoniae NOT DETECTED NOT DETECTED Final   Streptococcus pyogenes NOT DETECTED NOT DETECTED Final   A.calcoaceticus-baumannii NOT DETECTED NOT DETECTED Final   Bacteroides fragilis NOT DETECTED NOT DETECTED Final   Enterobacterales DETECTED (A) NOT DETECTED Final    Comment: Enterobacterales represent a large order of gram negative bacteria, not a single organism. CRITICAL RESULT CALLED TO, READ BACK BY AND VERIFIED WITH: JASON [email or web address]  06/06/21 RH    Enterobacter cloacae complex NOT DETECTED NOT DETECTED Final   Escherichia coli NOT DETECTED NOT DETECTED Final   Klebsiella aerogenes NOT DETECTED NOT DETECTED Final   Klebsiella oxytoca NOT DETECTED NOT DETECTED Final   Klebsiella pneumoniae DETECTED (A) NOT DETECTED Final    Comment: CRITICAL RESULT CALLED TO, READ BACK BY AND VERIFIED WITH: JASON ROBBINS@[email or web address]  06/06/21 RH    Proteus species NOT DETECTED NOT DETECTED Final   Salmonella species NOT DETECTED NOT DETECTED Final   Serratia marcescens NOT DETECTED NOT DETECTED Final   Haemophilus influenzae NOT DETECTED NOT DETECTED Final   Neisseria meningitidis NOT DETECTED NOT DETECTED  Final   Pseudomonas aeruginosa NOT DETECTED NOT DETECTED Final   Stenotrophomonas maltophilia NOT DETECTED NOT DETECTED Final   Candida albicans NOT DETECTED NOT DETECTED Final   Candida auris NOT DETECTED NOT DETECTED Final   Candida glabrata NOT DETECTED NOT DETECTED Final   Candida krusei NOT DETECTED NOT DETECTED Final   Candida parapsilosis NOT DETECTED NOT DETECTED Final   Candida tropicalis NOT DETECTED NOT DETECTED Final   Cryptococcus neoformans/gattii NOT DETECTED NOT DETECTED Final   CTX-M ESBL NOT DETECTED NOT DETECTED Final   Carbapenem resistance IMP NOT DETECTED NOT DETECTED Final   Carbapenem resistance KPC NOT DETECTED NOT DETECTED Final   Carbapenem resistance NDM NOT DETECTED NOT DETECTED Final   Carbapenem resist OXA 48 LIKE NOT DETECTED NOT DETECTED Final   Carbapenem resistance VIM NOT DETECTED NOT DETECTED Final    Comment: Performed at [facility], [address] [place], [place] [address]  Blood culture (routine x 2)     Status: Abnormal   Collection Time: 06/05/21  3:54 PM   Specimen: BLOOD  Result Value Ref Range Status   Specimen Description   Final    BLOOD RIGHT ANTECUBITAL Performed at [facility], [address], [place], [place] [address]    Special Requests   Final    BOTTLES DRAWN AEROBIC AND ANAEROBIC Blood Culture adequate volume Performed at [facility], [address] [place], [place] [address]    Culture  Setup Time   Final    GRAM NEGATIVE RODS IN BOTH AEROBIC AND ANAEROBIC BOTTLES CRITICAL VALUE NOTED.  VALUE IS CONSISTENT WITH PREVIOUSLY REPORTED AND CALLED VALUE. Performed at [facility], [address] [place], [place] [address]    Culture (A)  Final    KLEBSIELLA PNEUMONIAE SUSCEPTIBILITIES PERFORMED ON PREVIOUS CULTURE WITHIN THE LAST 5 DAYS. Performed at [facility], [place] [address], [place],  [address]    Report Status 06/08/2021 FINAL  Final  MRSA Next Gen by PCR,  Nasal     Status: None   Collection Time: 06/05/21  8:32 PM   Specimen: Nasal Mucosa; Nasal Swab  Result Value Ref Range Status   MRSA by PCR Next Gen NOT DETECTED NOT DETECTED Final    Comment: (NOTE) The GeneXpert MRSA Assay (FDA approved for NASAL specimens only), is one component of a comprehensive MRSA colonization surveillance program. It is not intended to diagnose MRSA infection nor to guide or monitor treatment for MRSA infections. Test performance is not FDA approved in patients less than 35 years old. Performed at [facility], [address] [place], [place] [address]   Urine Culture     Status: Abnormal   Collection Time: 06/05/21 10:25 PM   Specimen: Urine, Random  Result Value Ref Range Status   Specimen Description   Final    URINE, RANDOM Performed at [facility] [address] [place], [place] [address]    Special Requests   Final    NONE Performed at [facility], [address] [place], [place] [address]    Culture 20,000 COLONIES/mL KLEBSIELLA PNEUMONIAE (A)  Final   Report Status 06/07/2021 FINAL  Final   Organism ID, Bacteria KLEBSIELLA PNEUMONIAE (A)  Final      Susceptibility   Klebsiella pneumoniae - MIC*    AMPICILLIN RESISTANT Resistant     CEFAZOLIN <=4 SENSITIVE Sensitive     CEFEPIME <=0.12 SENSITIVE Sensitive     CEFTRIAXONE <=0.25 SENSITIVE Sensitive     CIPROFLOXACIN <=0.25 SENSITIVE Sensitive     GENTAMICIN <=1 SENSITIVE Sensitive     IMIPENEM 1 SENSITIVE Sensitive     NITROFURANTOIN 64 INTERMEDIATE Intermediate     TRIMETH/SULFA <=20 SENSITIVE Sensitive     AMPICILLIN/SULBACTAM 4 SENSITIVE Sensitive     PIP/TAZO <=4 SENSITIVE Sensitive     * 20,000 COLONIES/mL KLEBSIELLA PNEUMONIAE  Culture, blood (Routine X 2) w Reflex to ID Panel     Status: None   Collection Time: 06/11/21  4:52 AM   Specimen: BLOOD  Result Value Ref Range Status   Specimen Description BLOOD RIGHT HAND  Final   Special Requests    Final    BOTTLES DRAWN AEROBIC AND ANAEROBIC Blood Culture results may not be optimal due to an excessive volume of blood received in culture bottles   Culture   Final    NO GROWTH 5 DAYS Performed at [facility], [address] [place], [place] [address]    Report Status 06/16/2021 FINAL  Final  Culture, blood (Routine X 2) w Reflex to ID Panel     Status: None   Collection Time: 06/11/21  4:53 AM   Specimen: BLOOD  Result Value Ref Range Status   Specimen Description BLOOD RIGHT FA  Final   Special Requests   Final    BOTTLES DRAWN AEROBIC AND ANAEROBIC Blood Culture adequate volume   Culture   Final    NO GROWTH 5 DAYS Performed at [facility], [address], [place], [place] [address]    Report Status 06/16/2021 FINAL  Final    Signed:  Berle Mull MD.  [facility] 06/13/2021 , 7:37 AM

## 2021-06-24 ENCOUNTER — Ambulatory Visit
Admission: RE | Admit: 2021-06-24 | Discharge: 2021-06-24 | Disposition: A | Discharge status: Home / Self Care | Payer: Medicare Other | Attending: Urology | Admitting: Urology

## 2021-06-24 DIAGNOSIS — N201 Calculus of ureter: Secondary | ICD-10-CM | POA: Diagnosis present

## 2021-06-24 DIAGNOSIS — Z538 Procedure and treatment not carried out for other reasons: Secondary | ICD-10-CM | POA: Diagnosis not present

## 2021-06-24 MED ORDER — LACTATED RINGERS IV SOLN: INTRAVENOUS | Status: DC

## 2021-06-24 MED ORDER — FAMOTIDINE 20 MG PO TABS: 20.0000 mg | ORAL_TABLET | Freq: Once | ORAL | Status: DC

## 2021-06-24 MED ORDER — ORAL CARE MOUTH RINSE: 15.0000 mL | Freq: Once | OROMUCOSAL | Status: DC

## 2021-06-24 MED ORDER — CEFAZOLIN SODIUM-DEXTROSE 2-4 GM/100ML-% IV SOLN: 2.0000 g | INTRAVENOUS | Status: DC

## 2021-06-24 MED ORDER — CHLORHEXIDINE GLUCONATE 0.12 % MT SOLN: 15.0000 mL | Freq: Once | OROMUCOSAL | Status: DC

## 2021-06-24 NOTE — Progress Notes (Signed)
Patient drank coffee with creamer in it while sitting in the same day surgery waiting room. Spoke with Dr. Erlene Quan regarding this and she has requested to cancel the procedure.

## 2021-06-24 NOTE — Patient Instructions (Addendum)
REMEMBER: Instructions that are not followed completely may result in serious medical risk, up to and including death; or upon the discretion of your surgeon and anesthesiologist your surgery may need to be rescheduled.  Do not eat food or drink any liquids after midnight the night before surgery.  No gum chewing, lozengers or hard candies.  TAKE THESE MEDICATIONS THE MORNING OF SURGERY WITH A SIP OF WATER: - metoprolol succinate (TOPROL-XL) 100 MG 24 hr tablet   **Follow new guidelines for insulin and diabetes medications.**  Follow recommendations from Cardiologist, Pulmonologist or PCP regarding when to stop taking Eliquis.   One week prior to surgery: Stop Anti-inflammatories (NSAIDS) such as Advil, Aleve, Ibuprofen, Motrin, Naproxen, Naprosyn and Aspirin based products such as Excedrin, Goodys Powder, BC Powder. You may however, continue to take Tylenol if needed for pain up until the day of surgery. Stop ANY OVER THE COUNTER vitamins and supplements until after surgery.   No Alcohol for 24 hours before or after surgery.  No Smoking including e-cigarettes for 24 hours prior to surgery.  No chewable tobacco products for at least 6 hours prior to surgery.  No nicotine patches on the day of surgery.  Do not use any "recreational" drugs for at least a week prior to your surgery.  Please be advised that the combination of cocaine and anesthesia may have negative outcomes, up to and including death. If you test positive for cocaine, your surgery will be cancelled.  On the morning of surgery brush your teeth with toothpaste and water, you may rinse your mouth with mouthwash if you wish. Do not swallow any toothpaste or mouthwash.   Do not shave body from the neck down 48 hours prior to surgery just in case you cut yourself which could leave a site for infection.   Contact lenses, hearing aids and dentures may not be worn into surgery.  Do not bring valuables to the hospital. [facility] is not responsible for any missing/lost belongings or valuables.   Notify your doctor if there is any change in your medical condition (cold, fever, infection).  Wear comfortable clothing (specific to your surgery type) to the hospital.  If you are being discharged the day of surgery, you will not be allowed to drive home. You will need a responsible adult (18 years or older) to drive you home and stay with you that night.   If you are taking public transportation, you will need to have a responsible adult (18 years or older) with you. Please confirm with your physician that it is acceptable to use public transportation.   Please call the [facility] Dept. at [phone] if you have any questions about these instructions.  Surgery Visitation Policy:  Patients undergoing a surgery or procedure may have one family member or support person with them as long as that person is not COVID-19 positive or experiencing its symptoms.  That person may remain in the waiting area during the procedure and may rotate out with other people.  Inpatient Visitation:    Visiting hours are 7 a.m. to 8 p.m. Up to two visitors ages 16+ are allowed at one time in a patient room. The visitors may rotate out with other people during the day. Visitors must check out when they leave, or other visitors will not be allowed. One designated support person may remain overnight. The visitor must pass COVID-19 screenings, use hand sanitizer when entering and exiting the patient's room and wear a mask at all  times, including in the patient's room. Patients must also wear a mask when staff or their visitor are in the room. Masking is required regardless of vaccination status.

## 2021-06-24 NOTE — Progress Notes (Signed)
Patient instructions provided regarding pre-procedure preparation due to being rescheduled. Patient instructed to not eat or drink anything after midnight prior to his next procedure. General day before and day of surgery information given as a paper handout.

## 2021-06-26 ENCOUNTER — Telehealth: Payer: Self-pay | Admitting: Internal Medicine

## 2021-06-26 NOTE — Telephone Encounter (Signed)
Brandon Marks from [facility] needed to know if Dr Wynetta Emery would sign PT orders for patient. She would like a call back please.

## 2021-06-28 ENCOUNTER — Other Ambulatory Visit
Admission: RE | Admit: 2021-06-28 | Discharge: 2021-06-28 | Disposition: A | Discharge status: Home / Self Care | Payer: Medicare Other | Source: Ambulatory Visit | Attending: Urology | Admitting: Urology

## 2021-06-28 ENCOUNTER — Other Ambulatory Visit: Payer: Self-pay

## 2021-06-28 DIAGNOSIS — N201 Calculus of ureter: Secondary | ICD-10-CM

## 2021-06-28 HISTORY — DX: Atherosclerotic heart disease of native coronary artery without angina pectoris: I25.10

## 2021-06-28 HISTORY — DX: Personal history of urinary calculi: Z87.442

## 2021-06-28 NOTE — Anesthesia Preprocedure Evaluation (Addendum)
Anesthesia Evaluation  Patient identified by MRN, date of birth, ID band Patient awake    Reviewed: Allergy & Precautions, H&P , NPO status , Patient's Chart, lab work & pertinent test results  History of Anesthesia Complications Negative for: history of anesthetic complications  Airway Mallampati: II  TM Distance: >3 FB Neck ROM: Full    Dental  (+) Dental Advisory Given, Poor Dentition   Pulmonary neg shortness of breath, sleep apnea , neg COPD,    Pulmonary exam normal breath sounds clear to auscultation       Cardiovascular Exercise Tolerance: Poor hypertension, Pt. on medications (-) angina+ CAD and +CHF (HFrEF. Non-ischemic cardiomyopathy)  + dysrhythmias Atrial Fibrillation  Rhythm:irregular Rate:Abnormal + Peripheral Edema Left Heart Cath, 11/14-  nonobstructive CAD with normal LVEDP and moderately reduced LVEF   Neuro/Psych S/P removal of vestibular schwannoma negative psych ROS   GI/Hepatic Neg liver ROS, S/p gastric bypass surgery   Endo/Other  negative endocrine ROS  Renal/GU Renal disease (kidney stone s/p recovered renal failure)  negative genitourinary   Musculoskeletal  (+) Arthritis , Osteoarthritis,  Physical debility   Abdominal Normal abdominal exam  (+)   Peds  Hematology  (+) anemia ,   Anesthesia Other Findings Pt was recently admitted for Sepsis, obstructive uropathy and was treated by urology. Cardiology was involved to evaluate heart failure.   Past Medical History: No date: Arthritis No date: Herniated disc No date: High cholesterol No date: Hypertension No date: Morbid obesity ([place]) No date: PAC (premature atrial contraction) No date: Sleep apnea     Comment:  non compliant with c-pap   Reproductive/Obstetrics negative OB ROS                            Anesthesia Physical  Anesthesia Plan  ASA: III  Anesthesia Plan: General   Post-op Pain  Management:    Induction: Intravenous  PONV Risk Score and Plan: 3 and Treatment may vary due to age or medical condition, Ondansetron and Dexamethasone  Airway Management Planned: Oral ETT  Additional Equipment:   Intra-op Plan:   Post-operative Plan: Extubation in OR  Informed Consent: I have reviewed the patients History and Physical, chart, labs and discussed the procedure including the risks, benefits and alternatives for the proposed anesthesia with the patient or authorized representative who has indicated his/her understanding and acceptance.     Dental advisory given  Plan Discussed with: CRNA and Anesthesiologist  Anesthesia Plan Comments:        Anesthesia Quick Evaluation

## 2021-06-28 NOTE — Patient Instructions (Signed)
Your procedure is scheduled on: Monday July 01, 2021. Report to Day Surgery inside [facility] 2nd floor stop by admissions desk before getting on elevator. To find out your arrival time please call [phone] between 1PM - 3PM on Friday June 28, 2021.  Remember: Instructions that are not followed completely may result in serious medical risk,  up to and including death, or upon the discretion of your surgeon and anesthesiologist your  surgery may need to be rescheduled.     _X__ 1. Do not eat food or drink fluids after midnight the night before your procedure.                 No chewing gum or hard candies.   __X__2.  On the morning of surgery brush your teeth with toothpaste and water, you                may rinse your mouth with mouthwash if you wish.  Do not swallow any toothpaste or mouthwash.     _X__ 3.  No Alcohol for 24 hours before or after surgery.   _X__ 4.  Do Not Smoke or use e-cigarettes For 24 Hours Prior to Your Surgery.                 Do not use any chewable tobacco products for at least 6 hours prior to                 Surgery.  _X__  5.  Do not use any recreational drugs (marijuana, cocaine, heroin, ecstasy, MDMA or other)                For at least one week prior to your surgery.  Combination of these drugs with anesthesia                May have life threatening results.  __X__6.  Notify your doctor if there is any change in your medical condition      (cold, fever, infections).     Do not wear jewelry, make-up, hairpins, clips or nail polish. Do not wear lotions, powders, or perfumes. You may wear deodorant. Do not shave 48 hours prior to surgery. Men may shave face and neck. Do not bring valuables to the hospital.    [facility] is not responsible for any belongings or valuables.  Contacts, dentures or bridgework may not be worn into surgery. Leave your suitcase in the car. After surgery it may be brought to your  room. For patients admitted to the hospital, discharge time is determined by your treatment team.   Patients discharged the day of surgery will not be allowed to drive home.   Make arrangements for someone to be with you for the first 24 hours of your Same Day Discharge.   __X__ Take these medicines the morning of surgery with A SIP OF WATER:    1. metoprolol succinate (TOPROL-XL) 100 MG  2.   3.   4.  5.  6.  ____ Fleet Enema (as directed)   ____ Use CHG Soap (or wipes) as directed  ____ Use Benzoyl Peroxide Gel as instructed  ____ Use inhalers on the day of surgery  ____ Stop metformin 2 days prior to surgery    ____ Take 1/2 of usual insulin dose the night before surgery. No insulin the morning          of surgery.   ____ Call your PCP, cardiologist, or Pulmonologist if taking Coumadin/Plavix/aspirin  and ask when to stop before your surgery.   __X__ One Week prior to surgery- Stop Anti-inflammatories such as Ibuprofen, Aleve, Advil, Motrin, meloxicam (MOBIC), diclofenac, etodolac, ketorolac, Toradol, Daypro, piroxicam, Goody's or BC powders. OK TO USE TYLENOL IF NEEDED   __X__ Stop supplements until after surgery.    ____ Bring C-Pap to the hospital.    If you have any questions regarding your pre-procedure instructions,  Please call Pre-admit Testing at [phone]

## 2021-07-01 ENCOUNTER — Encounter: Payer: Self-pay | Admitting: Urology

## 2021-07-01 ENCOUNTER — Ambulatory Visit: Payer: Medicare Other

## 2021-07-01 ENCOUNTER — Ambulatory Visit: Payer: Medicare Other | Admitting: Urgent Care

## 2021-07-01 ENCOUNTER — Ambulatory Visit
Admission: RE | Admit: 2021-07-01 | Discharge: 2021-07-01 | Disposition: A | Discharge status: Home / Self Care | Payer: Medicare Other | Attending: Urology | Admitting: Urology

## 2021-07-01 ENCOUNTER — Other Ambulatory Visit: Payer: Self-pay

## 2021-07-01 ENCOUNTER — Encounter
Admission: RE | Disposition: A | Discharge status: Home / Self Care | Payer: Self-pay | Source: Home / Self Care | Attending: Urology

## 2021-07-01 DIAGNOSIS — Z9889 Other specified postprocedural states: Secondary | ICD-10-CM | POA: Diagnosis not present

## 2021-07-01 DIAGNOSIS — Z9884 Bariatric surgery status: Secondary | ICD-10-CM | POA: Diagnosis not present

## 2021-07-01 DIAGNOSIS — R7881 Bacteremia: Secondary | ICD-10-CM | POA: Diagnosis not present

## 2021-07-01 DIAGNOSIS — I4891 Unspecified atrial fibrillation: Secondary | ICD-10-CM | POA: Insufficient documentation

## 2021-07-01 DIAGNOSIS — Z792 Long term (current) use of antibiotics: Secondary | ICD-10-CM | POA: Diagnosis not present

## 2021-07-01 DIAGNOSIS — I110 Hypertensive heart disease with heart failure: Secondary | ICD-10-CM | POA: Diagnosis not present

## 2021-07-01 DIAGNOSIS — I5020 Unspecified systolic (congestive) heart failure: Secondary | ICD-10-CM | POA: Insufficient documentation

## 2021-07-01 DIAGNOSIS — G4730 Sleep apnea, unspecified: Secondary | ICD-10-CM | POA: Insufficient documentation

## 2021-07-01 DIAGNOSIS — D649 Anemia, unspecified: Secondary | ICD-10-CM | POA: Insufficient documentation

## 2021-07-01 DIAGNOSIS — I2510 Atherosclerotic heart disease of native coronary artery without angina pectoris: Secondary | ICD-10-CM | POA: Insufficient documentation

## 2021-07-01 DIAGNOSIS — B961 Klebsiella pneumoniae [K. pneumoniae] as the cause of diseases classified elsewhere: Secondary | ICD-10-CM | POA: Diagnosis not present

## 2021-07-01 DIAGNOSIS — N201 Calculus of ureter: Secondary | ICD-10-CM | POA: Insufficient documentation

## 2021-07-01 DIAGNOSIS — M1991 Primary osteoarthritis, unspecified site: Secondary | ICD-10-CM | POA: Diagnosis not present

## 2021-07-01 HISTORY — PX: CYSTOSCOPY/URETEROSCOPY/HOLMIUM LASER/STENT PLACEMENT: SHX6546

## 2021-07-01 SURGERY — CYSTOSCOPY/URETEROSCOPY/HOLMIUM LASER/STENT PLACEMENT
Anesthesia: General | Laterality: Left

## 2021-07-01 MED ORDER — CEFAZOLIN SODIUM-DEXTROSE 2-4 GM/100ML-% IV SOLN
2.0000 g | INTRAVENOUS | Status: AC
  Administered 2021-07-01: 2 g via INTRAVENOUS

## 2021-07-01 MED ORDER — FENTANYL CITRATE (PF) 100 MCG/2ML IJ SOLN
INTRAMUSCULAR | Status: AC
  Filled 2021-07-01: qty 2

## 2021-07-01 MED ORDER — ACETAMINOPHEN 10 MG/ML IV SOLN
INTRAVENOUS | Status: DC | PRN
  Administered 2021-07-01: 1000 mg via INTRAVENOUS

## 2021-07-01 MED ORDER — ACETAMINOPHEN 10 MG/ML IV SOLN
INTRAVENOUS | Status: AC
  Filled 2021-07-01: qty 100

## 2021-07-01 MED ORDER — SUCCINYLCHOLINE CHLORIDE 200 MG/10ML IV SOSY
PREFILLED_SYRINGE | INTRAVENOUS | Status: DC | PRN
  Administered 2021-07-01: 100 mg via INTRAVENOUS

## 2021-07-01 MED ORDER — LIDOCAINE HCL (PF) 2 % IJ SOLN
INTRAMUSCULAR | Status: AC
  Filled 2021-07-01: qty 5

## 2021-07-01 MED ORDER — PHENYLEPHRINE HCL (PRESSORS) 10 MG/ML IV SOLN
INTRAVENOUS | Status: DC | PRN
  Administered 2021-07-01 (×5): 160 ug via INTRAVENOUS

## 2021-07-01 MED ORDER — METOPROLOL SUCCINATE ER 25 MG PO TB24
100.0000 mg | ORAL_TABLET | Freq: Every day | ORAL | Status: DC
  Administered 2021-07-01: 100 mg via ORAL
  Filled 2021-07-01: qty 4

## 2021-07-01 MED ORDER — LIDOCAINE HCL (CARDIAC) PF 100 MG/5ML IV SOSY
PREFILLED_SYRINGE | INTRAVENOUS | Status: DC | PRN
  Administered 2021-07-01: 100 mg via INTRAVENOUS

## 2021-07-01 MED ORDER — ROCURONIUM BROMIDE 10 MG/ML (PF) SYRINGE
PREFILLED_SYRINGE | INTRAVENOUS | Status: AC
  Filled 2021-07-01: qty 10

## 2021-07-01 MED ORDER — CEFAZOLIN SODIUM-DEXTROSE 2-4 GM/100ML-% IV SOLN
INTRAVENOUS | Status: AC
  Filled 2021-07-01: qty 100

## 2021-07-01 MED ORDER — FENTANYL CITRATE (PF) 100 MCG/2ML IJ SOLN
INTRAMUSCULAR | Status: DC | PRN
  Administered 2021-07-01: 50 ug via INTRAVENOUS

## 2021-07-01 MED ORDER — DEXAMETHASONE SODIUM PHOSPHATE 10 MG/ML IJ SOLN
INTRAMUSCULAR | Status: AC
  Filled 2021-07-01: qty 1

## 2021-07-01 MED ORDER — EPHEDRINE SULFATE 50 MG/ML IJ SOLN
INTRAMUSCULAR | Status: DC | PRN
  Administered 2021-07-01: 10 mg via INTRAVENOUS

## 2021-07-01 MED ORDER — ACETAMINOPHEN 10 MG/ML IV SOLN: 1000.0000 mg | Freq: Once | INTRAVENOUS | Status: DC | PRN

## 2021-07-01 MED ORDER — MIDAZOLAM HCL 2 MG/2ML IJ SOLN
INTRAMUSCULAR | Status: AC
  Filled 2021-07-01: qty 2

## 2021-07-01 MED ORDER — PROPOFOL 10 MG/ML IV BOLUS
INTRAVENOUS | Status: AC
  Filled 2021-07-01: qty 20

## 2021-07-01 MED ORDER — PROMETHAZINE HCL 25 MG/ML IJ SOLN
INTRAMUSCULAR | Status: AC
  Filled 2021-07-01: qty 1

## 2021-07-01 MED ORDER — CHLORHEXIDINE GLUCONATE 0.12 % MT SOLN
OROMUCOSAL | Status: AC
  Filled 2021-07-01: qty 15

## 2021-07-01 MED ORDER — DEXAMETHASONE SODIUM PHOSPHATE 10 MG/ML IJ SOLN
INTRAMUSCULAR | Status: DC | PRN
Start: 2021-07-01 — End: 2021-07-01
  Administered 2021-07-01: 10 mg via INTRAVENOUS

## 2021-07-01 MED ORDER — CHLORHEXIDINE GLUCONATE 0.12 % MT SOLN
15.0000 mL | Freq: Once | OROMUCOSAL | Status: AC
  Administered 2021-07-01: 15 mL via OROMUCOSAL

## 2021-07-01 MED ORDER — ROCURONIUM BROMIDE 100 MG/10ML IV SOLN
INTRAVENOUS | Status: DC | PRN
  Administered 2021-07-01: 40 mg via INTRAVENOUS

## 2021-07-01 MED ORDER — SUGAMMADEX SODIUM 200 MG/2ML IV SOLN
INTRAVENOUS | Status: DC | PRN
  Administered 2021-07-01: 200 mg via INTRAVENOUS

## 2021-07-01 MED ORDER — EPHEDRINE 5 MG/ML INJ
INTRAVENOUS | Status: AC
  Filled 2021-07-01: qty 5

## 2021-07-01 MED ORDER — PROMETHAZINE HCL 25 MG/ML IJ SOLN
6.2500 mg | INTRAMUSCULAR | Status: DC | PRN
  Administered 2021-07-01: 6.25 mg via INTRAVENOUS

## 2021-07-01 MED ORDER — LACTATED RINGERS IV SOLN: INTRAVENOUS | Status: DC

## 2021-07-01 MED ORDER — ONDANSETRON HCL 4 MG/2ML IJ SOLN
INTRAMUSCULAR | Status: AC
  Filled 2021-07-01: qty 2

## 2021-07-01 MED ORDER — SUCCINYLCHOLINE CHLORIDE 200 MG/10ML IV SOSY
PREFILLED_SYRINGE | INTRAVENOUS | Status: AC
  Filled 2021-07-01: qty 10

## 2021-07-01 MED ORDER — HYDROCODONE-ACETAMINOPHEN 5-325 MG PO TABS: 1.0000 | ORAL_TABLET | Freq: Four times a day (QID) | ORAL | 0 refills | Status: DC | PRN

## 2021-07-01 MED ORDER — ORAL CARE MOUTH RINSE: 15.0000 mL | Freq: Once | OROMUCOSAL | Status: AC

## 2021-07-01 MED ORDER — PROPOFOL 10 MG/ML IV BOLUS
INTRAVENOUS | Status: DC | PRN
  Administered 2021-07-01: 150 mg via INTRAVENOUS

## 2021-07-01 MED ORDER — ONDANSETRON HCL 4 MG/2ML IJ SOLN
INTRAMUSCULAR | Status: DC | PRN
  Administered 2021-07-01: 4 mg via INTRAVENOUS

## 2021-07-01 SURGICAL SUPPLY — 32 items
ADH LQ OCL WTPRF AMP STRL LF (MISCELLANEOUS) ×1
ADHESIVE MASTISOL STRL (MISCELLANEOUS) ×2 IMPLANT
BAG DRAIN CYSTO-URO LG1000N (MISCELLANEOUS) ×2 IMPLANT
BASKET ZERO TIP 1.9FR (BASKET) ×2 IMPLANT
BRUSH SCRUB EZ 1% IODOPHOR (MISCELLANEOUS) ×2 IMPLANT
BSKT STON RTRVL ZERO TP 1.9FR (BASKET) ×1
CATH URET FLEX-TIP 2 LUMEN 10F (CATHETERS) ×2 IMPLANT
CATH URETL OPEN 5X70 (CATHETERS) ×2 IMPLANT
CNTNR SPEC 2.5X3XGRAD LEK (MISCELLANEOUS) ×1
CONT SPEC 4OZ STER OR WHT (MISCELLANEOUS) ×1
CONT SPEC 4OZ STRL OR WHT (MISCELLANEOUS) ×1
CONTAINER SPEC 2.5X3XGRAD LEK (MISCELLANEOUS) ×1 IMPLANT
DRAPE UTILITY 15X26 TOWEL STRL (DRAPES) ×2 IMPLANT
DRSG TEGADERM 2-3/8X2-3/4 SM (GAUZE/BANDAGES/DRESSINGS) ×2 IMPLANT
GAUZE 4X4 16PLY ~~LOC~~+RFID DBL (SPONGE) ×4 IMPLANT
GLOVE SURG ENC MOIS LTX SZ6.5 (GLOVE) ×2 IMPLANT
GOWN STRL REUS W/ TWL LRG LVL3 (GOWN DISPOSABLE) ×2 IMPLANT
GOWN STRL REUS W/TWL LRG LVL3 (GOWN DISPOSABLE) ×4
GUIDEWIRE GREEN .038 145CM (MISCELLANEOUS) ×2 IMPLANT
GUIDEWIRE STR DUAL SENSOR (WIRE) ×2 IMPLANT
INFUSOR MANOMETER BAG 3000ML (MISCELLANEOUS) ×2 IMPLANT
IV NS IRRIG 3000ML ARTHROMATIC (IV SOLUTION) ×2 IMPLANT
KIT TURNOVER CYSTO (KITS) ×2 IMPLANT
MANIFOLD NEPTUNE II (INSTRUMENTS) ×2 IMPLANT
PACK CYSTO AR (MISCELLANEOUS) ×2 IMPLANT
SET CYSTO W/LG BORE CLAMP LF (SET/KITS/TRAYS/PACK) ×2 IMPLANT
SHEATH URETERAL 12FR 45CM (SHEATH) ×2 IMPLANT
STENT URET 6FRX26 CONTOUR (STENTS) ×2 IMPLANT
SURGILUBE 2OZ TUBE FLIPTOP (MISCELLANEOUS) ×2 IMPLANT
TRACTIP FLEXIVA PULSE ID 200 (Laser) ×4 IMPLANT
WATER STERILE IRR 1000ML POUR (IV SOLUTION) ×2 IMPLANT
WATER STERILE IRR 500ML POUR (IV SOLUTION) ×2 IMPLANT

## 2021-07-01 NOTE — Interval H&P Note (Signed)
History and Physical Interval Note:  07/01/2021 11:02 AM  [name]  has presented today for surgery, with the diagnosis of Left Ureteral Stone.  The various methods of treatment have been discussed with the patient and family. After consideration of risks, benefits and other options for treatment, the patient has consented to  Procedure(s): CYSTOSCOPY/URETEROSCOPY/HOLMIUM LASER/STENT EXCHANGE (Left) as a surgical intervention.  [name] history has been reviewed, patient examined, no change in status, stable for surgery.  I have reviewed the patient's chart and labs.  Questions were answered to the patient's satisfaction.    Returns today for definitive management of his stone, laser litho and stent exchange.  Completed abx.    RRR CTAB   Hollice Espy

## 2021-07-01 NOTE — Telephone Encounter (Signed)
Returned [place] call and made aware that Dr. Wynetta Emery will sign PT orders

## 2021-07-01 NOTE — Transfer of Care (Signed)
Immediate Anesthesia Transfer of Care Note  Patient: Brandon Marks  Procedure(s) Performed: CYSTOSCOPY/URETEROSCOPY/HOLMIUM LASER/STENT EXCHANGE (Left)  Patient Location: PACU  Anesthesia Type:General  Level of Consciousness: awake and alert   Airway & Oxygen Therapy: Patient Spontanous Breathing and Patient connected to face mask oxygen  Post-op Assessment: Report given to RN and Post -op Vital signs reviewed and stable  Post vital signs: Reviewed and stable  Last Vitals:  Vitals Value Taken Time  BP 111/84 07/01/21 1215  Temp    Pulse 76 07/01/21 1217  Resp 17 07/01/21 1217  SpO2 100 % 07/01/21 1217  Vitals shown include unvalidated device data.  Last Pain:  Vitals:   07/01/21 1020  TempSrc: Temporal  PainSc: 0-No pain         Complications: No notable events documented.

## 2021-07-01 NOTE — Op Note (Signed)
Date of procedure: 07/01/21  Preoperative diagnosis:  13 mm left proximal ureteral calculus  Postoperative diagnosis:  Same as above  Procedure: Left ureteroscopy with laser lithotripsy Left ureteral stent exchange Left retrograde pyelogram Basket extraction of stone fragment Interpretation fluoroscopy less than 30 minutes  Surgeon: Hollice Espy, MD  Anesthesia: General  Complications: None  Intraoperative findings: 13 mm stone seen in the proximal ureter, retropulsed to upper mid pole calyx with manipulation of wires and stent.  Completely obliterated and only dustlike material remained.  No extravasation, stent replaced on tether.  EBL: Minimal  Specimens: Stone fragment  Drains: 6 x 26 French double-J ureteral stent on left with tether  Indication: SHIQUAN MATHIEU is a 65 y.o. patient with 13 mm left proximal ureteral calculus initially presenting with urosepsis who underwent emergent ureteral stent.  He returns today after his antibiotic treatment course is completed for definitive management of his stone.  After reviewing the management options for treatment, he elected to proceed with the above surgical procedure(s). We have discussed the potential benefits and risks of the procedure, side effects of the proposed treatment, the likelihood of the patient achieving the goals of the procedure, and any potential problems that might occur during the procedure or recuperation. Informed consent has been obtained.  Description of procedure:  The patient was taken to the operating room and general anesthesia was induced.  The patient was placed in the dorsal lithotomy position, prepped and draped in the usual sterile fashion, and preoperative antibiotics were administered. A preoperative time-out was performed.   A 21 French the scope was advanced per urethra into the bladder.  Attention was turned to the left ureteral orifice from which a ureteral stent was seen emanating.  The  distal coil of the stent was grasped and brought to level the urethral meatus.  The stone could be seen in the left proximal ureter.  A sensor wire was then placed beyond the stone up to the level of the kidney.  A dual-lumen access sheath was used to introduce a second Super Stiff wire up to the level of the kidney.  At this time, the stone retropulsed into a mid upper pole calyx.  Using a Super Stiff wire as a working wire and this sensor wire as a safety wire, I advanced a Lacinda Axon 12/14 French ureteral access sheath to the proximal ureter which went easily under fluoroscopic guidance.  The inner lumen and wire were then removed excluding the safety wire.  A digital flexible dual channel Wolf ureteroscope was then advanced to the level of the stone.  A 242 micro laser fiber was then used using dusting settings of 0.3 J and 80 Hz to fragment the stone into dust like particles.  A few of these particles were extracted using 1.9 French tipless nitinol basket.  The remaining fragments were too small to basket were approximately the tip of the size of the laser fiber.  Each and every calyx was then directly visualized.  Additional contrast material was then injected to create a retrograde pyelogram which showed no hydronephrosis or filling defects.  Each every calyx was then visualized.  The scope was backed out the length the ureter inspecting along the way.  There was some very minimal edema in the proximal ureter where the stone had been otherwise no injuries mucosal abrasions, or any other pathology in the ureter.  Finally, a 6 x 26 French double-J ureteral stent was advanced over the safety wire up to the level  of the renal pelvis.  Upon wire withdrawal, there was full coil in the renal pelvis as well as within the bladder.  The stent string was left attached to the distal coil of the stent which was affixed to the patient's glans using Mastisol and Tegaderm.  The patient was then cleaned and dried, repositioned to  supine position, taken to the PACU in stable condition.  There were no complications in this case.  Plan: We will have him remove his own stent on Thursday.  He will follow-up in 4 weeks with a renal ultrasound prior.  Hollice Espy, M.D.

## 2021-07-01 NOTE — Discharge Instructions (Addendum)
You have a ureteral stent in place.  This is a tube that extends from your kidney to your bladder.  This may cause urinary bleeding, burning with urination, and urinary frequency.  Please call our office or present to the ED if you develop fevers >101 or pain which is not able to be controlled with oral pain medications.  You may be given either Flomax and/ or ditropan to help with bladder spasms and stent pain in addition to pain medications.    You may remove your own stent on Thursday.  On this day, untape and then gently pull the string until the entire stent is removed.  If you have any difficulty, please contact our office.  [facility] [address] [place], [place] [address] [phone] AMBULATORY SURGERY  DISCHARGE INSTRUCTIONS   The drugs that you were given will stay in your system until tomorrow so for the next 24 hours you should not:  Drive an automobile Make any legal decisions Drink any alcoholic beverage   You may resume regular meals tomorrow.  Today it is better to start with liquids and gradually work up to solid foods.  You may eat anything you prefer, but it is better to start with liquids, then soup and crackers, and gradually work up to solid foods.   Please notify your doctor immediately if you have any unusual bleeding, trouble breathing, redness and pain at the surgery site, drainage, fever, or pain not relieved by medication.    Your post-operative visit with Dr.                                       is: Date:                        Time:    Please call to schedule your post-operative visit.  Additional Instructions:

## 2021-07-01 NOTE — Anesthesia Procedure Notes (Signed)
Procedure Name: Intubation Date/Time: 07/01/2021 11:30 AM Performed by: Aline Brochure, CRNA Pre-anesthesia Checklist: Patient identified, Patient being monitored, Timeout performed, Emergency Drugs available and Suction available Patient Re-evaluated:Patient Re-evaluated prior to induction Oxygen Delivery Method: Circle system utilized Preoxygenation: Pre-oxygenation with 100% oxygen Induction Type: IV induction Ventilation: Mask ventilation without difficulty Laryngoscope Size: McGraph and 4 Grade View: Grade I Tube type: Oral Tube size: 7.5 mm Number of attempts: 1 Airway Equipment and Method: Stylet Placement Confirmation: ETT inserted through vocal cords under direct vision, positive ETCO2 and breath sounds checked- equal and bilateral Secured at: 22 cm Tube secured with: Tape Dental Injury: Teeth and Oropharynx as per pre-operative assessment  Difficulty Due To: Difficult Airway- due to reduced neck mobility and Difficulty was anticipated

## 2021-07-01 NOTE — Anesthesia Postprocedure Evaluation (Signed)
Anesthesia Post Note  Patient: Brandon Marks  Procedure(s) Performed: CYSTOSCOPY/URETEROSCOPY/HOLMIUM LASER/STENT EXCHANGE (Left)  Patient location during evaluation: PACU Anesthesia Type: General Level of consciousness: awake and alert Pain management: pain level controlled Vital Signs Assessment: post-procedure vital signs reviewed and stable Respiratory status: spontaneous breathing, nonlabored ventilation and respiratory function stable Cardiovascular status: blood pressure returned to baseline and stable Postop Assessment: no apparent nausea or vomiting Anesthetic complications: no   No notable events documented.   Last Vitals:  Vitals:   07/01/21 1340 07/01/21 1355  BP:  139/74  Pulse: 73   Resp:  14  Temp: 36.4 C   SpO2:  100%    Last Pain:  Vitals:   07/01/21 1355  TempSrc:   PainSc: 0-No pain                 [place] Ouch

## 2021-07-02 NOTE — Progress Notes (Deleted)
Cardiology Clinic Note   Patient Name: Brandon Marks Date of Encounter: 07/02/2021  Primary Care Provider:  Ladell Pier, MD Primary Cardiologist:  Brandon Klein, MD  Patient Profile    Brandon Marks 65 year old male presents for follow-up for chronic diastolic CHF and atrial fibrillation with RVR  Past Medical History    Past Medical History:  Diagnosis Date   Arthritis    Coronary artery disease    CSF leak    a. 12/2019 following resection of vestibular schwannoma; b.  12/2020 status post lumbar puncture for ventriculomegaly; e.  01/2021: Status post lumbar drain ([facility]).  Pending VP shunt.   Herniated disc    High cholesterol    History of kidney stones    History of stress test    a. 09/2007 MV: EF 63%, small area of anterolateral and apical reversibility; b.  12/2016 MV: EF 62%.  Fixed small, mild mid anteroseptal and apical defect without reversibility.  Most likely attenuation.  Low risk study.   Hypertension    Morbid obesity ([place])    PAC (premature atrial contraction)    Persistent atrial fibrillation ([place])    a. first noted on 12 lead ECG 10/2019.   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 12/27/2019 s/p resection ([facility]). Post-op course complicated by CSF leak/seizures req repair 01/06/2020 and high volume LP on 01/16/2020.   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 at [facility]  12/27/2019   accoustic neuroma   CARDIAC CATHETERIZATION  02/20/2009   normal coronary arteries   CYSTOSCOPY WITH STENT PLACEMENT Left 06/05/2021   Procedure: CYSTOSCOPY WITH STENT PLACEMENT;  Surgeon: Brandon Espy, MD;  Location: [facility];  Service: Urology;  Laterality: Left;   CYSTOSCOPY/URETEROSCOPY/HOLMIUM LASER/STENT PLACEMENT Left 07/01/2021   Procedure: CYSTOSCOPY/URETEROSCOPY/HOLMIUM LASER/STENT EXCHANGE;  Surgeon: Brandon Espy, MD;  Location: [facility];  Service: Urology;  Laterality: Left;   EYE  SURGERY     bilateral cataract with lens implant   INCISION AND DRAINAGE PERIRECTAL ABSCESS N/A 01/02/2018   Procedure: IRRIGATION AND DEBRIDEMENT PERIRECTAL ABSCESS;  Surgeon: Brandon Keens, MD;  Location: [place];  Service: General;  Laterality: N/A;   IRRIGATION AND DEBRIDEMENT BUTTOCKS N/A 01/05/2018   Procedure: DEBRIDEMENT BUTTOCKS ABSCESS;  Surgeon: Brandon Skeans, MD;  Location: [place];  Service: General;  Laterality: N/A;   KNEE ARTHROSCOPY  2010   Lt   LEFT HEART CATH AND CORONARY ANGIOGRAPHY N/A 06/10/2021   Procedure: LEFT HEART CATH AND CORONARY ANGIOGRAPHY;  Surgeon: Brandon Hampshire, MD;  Location: [place] [facility];  Service: Cardiovascular;  Laterality: N/A;   NM MYOCAR PERF WALL MOTION  09/28/2007   small area of reversibility in the anterolateral wall at the apex concerning for ischemia   SHOULDER ARTHROSCOPY  10/2010   Rt   TOTAL KNEE ARTHROPLASTY Left 08/11/2019   Procedure: TOTAL KNEE ARTHROPLASTY;  Surgeon: Brandon Cancel, MD;  Location: [facility];  Service: Orthopedics;  Laterality: Left;  70 mins   TOTAL KNEE ARTHROPLASTY Right 09/13/2019   Procedure: TOTAL KNEE ARTHROPLASTY;  Surgeon: Brandon Cancel, MD;  [facility];  Service: Orthopedics;  Laterality: Right;  70 mins   WOUND DEBRIDEMENT N/A 01/04/2018   Procedure: IRRIGATION AND DEBRIDEMENT OF BUTTOCKS AND REMOVAL OF TICK FROM RIGHT TESTICLE;  Surgeon: Brandon Skeans, MD;  Location: [place];  Service: General;  Laterality: N/A;    Allergies  Allergies  Allergen  Reactions   Codeine Nausea And Vomiting   Oxycodone Other (See Comments)    Upset GI    History of Present Illness    Brandon Marks is a 65 y.o. male with a hx of hypertension, chronic diastolic CHF, HLD, OSA, and morbid obesity.  He underwent cardiac catheterization in 2010 which showed normal coronary arteries after a false positive nuclear stress test.  Stress test was completed in anticipation of bariatric surgery.  He underwent repeat  stress testing in 2018 which showed low risk and was negative for ischemia.  He was seen by Brandon Marks to/19 for preoperative cardiac evaluation.  He was cleared for gastric bypass at that time without further ischemic evaluation.  He underwent surgery 2/19.  He was readmitted 6/19 with necrotizing fasciitis of the right buttock.  He underwent I&D with debridement and was treated with antibiotics for septic shock.  A tick was found on him in the operating room.  He received prophylactic course of doxycycline.  He was noted to have kidney injury as a result of septic shock.  On arrival his creatinine was 2.1 which improved to 0.89 with fluid resuscitation/hydration.  He followed up with Brandon Deforest, PA-C on 03/14/2019.  During that time he reported he had lost about 140 pounds after his bariatric surgery.  He has not had any further problems with infection.  His blood pressure was well controlled.  He did not have any lower extremity swelling, denied orthopnea PND.  He denied recent episodes of chest discomfort.  He presented to the clinic 06/06/2021 for follow-up evaluation and preoperative cardiac evaluation.  He stated he felt well aside from some slight dizziness.  He presented today in anticipation of shunt placement.  He was having his procedure done at [facility].  He had been somewhat physically active and was able to climb 2 flights of stairs without chest pain or increased work of breathing.  He reported compliance with his apixaban and denied bleeding issues.  We reviewed recommendations from pharmacy prior to his upcoming surgery.  His blood pressure had been well controlled.  He was cardiac unaware.  I  gave him the salty 6 diet sheet, asked him to maintain his physical activity, and planned follow-up in 6 to 9 months.  He presented to the emergency department and was evaluated by cardiology on 06/06/2021.  He was found to be in atrial fibrillation with RVR.  His rate was felt to be elevated due to sepsis,  bacteremia with Klebsiella pneumonia.  He was placed on broad-spectrum antibiotics and was receiving norepinephrine for hypotension.  He was placed on amiodarone for rate control.  His troponins were noted to be elevated.  This was felt to be related to his atrial fibrillation, hypotension, and sepsis.  No plan for ischemic evaluation was made.  His echocardiogram showed an LVEF of 30-35% global hypokinesis, intermediate diastolic parameters, mildly enlarged right ventricle, mildly dilated left atria.  His rate improved and he was transitioned off IV amiodarone.  He was placed on metoprolol succinate, remained on his apixaban and plan for outpatient cardioversion was discussed.  Plan for urologic procedure prior to cardioversion.  It was felt that he would be safe to stop his anticoagulation 2 days prior to his urologic procedure with no further cardiac testing.  Plan for DCCV 1 month after urologic procedure.  He presents to the clinic today for follow-up evaluation states***  Today he denies chest pain, shortness of breath, lower extremity edema, fatigue, palpitations, melena,  hematuria, hemoptysis, diaphoresis, weakness, presyncope, syncope, orthopnea, and PND.  Home Medications    Prior to Admission medications   Medication Sig Start Date End Date Taking? Authorizing Provider  apixaban (ELIQUIS) 5 MG TABS tablet Take 1 tablet (5 mg total) by mouth 2 (two) times daily. 03/22/21   Deberah Pelton, NP  diltiazem (CARDIZEM CD) 180 MG 24 hr capsule Take 1 capsule (180 mg total) by mouth daily. 04/01/21 05/01/21  Gareth Morgan, MD  furosemide (LASIX) 20 MG tablet Take 1 tablet (20 mg total) by mouth daily as needed for edema (for swelling in legs/ankle). Patient not taking: Reported on 02/22/2021 11/15/20   Brandon Pier, MD    Family History    Family History  Problem Relation Age of Onset   Hypertension Mother    Heart failure Mother    Alzheimer's disease Mother    Skin cancer Mother     Heart disease Father    Alzheimer's disease Maternal Grandmother    Alzheimer's disease Maternal Grandfather    He indicated that his mother is alive. He indicated that his father is deceased. He indicated that his brother is alive. He indicated that his maternal grandmother is deceased. He indicated that his maternal grandfather is deceased. He indicated that his paternal grandmother is deceased. He indicated that his paternal grandfather is deceased.  Social History    Social History   Socioeconomic History   Marital status: Divorced    Spouse name: Not on file   Number of children: 0   Years of education: some college   Highest education level: Not on file  Occupational History   Occupation: Retired  Tobacco Use   Smoking status: Never   Smokeless tobacco: Never  Vaping Use   Vaping Use: Never used  Substance and Sexual Activity   Alcohol use: No   Drug use: No   Sexual activity: Not on file  Other Topics Concern   Not on file  Social History Narrative   Denies caffeine use    Social Determinants of Health   Financial Resource Strain: Not on file  Food Insecurity: Not on file  Transportation Needs: Not on file  Physical Activity: Not on file  Stress: Not on file  Social Connections: Not on file  Intimate Partner Violence: Not on file     Review of Systems    General:  No chills, fever, night sweats or weight changes.  Cardiovascular:  No chest pain, dyspnea on exertion, edema, orthopnea, palpitations, paroxysmal nocturnal dyspnea. Dermatological: No rash, lesions/masses Respiratory: No cough, dyspnea Urologic: No hematuria, dysuria Abdominal:   No nausea, vomiting, diarrhea, bright red blood per rectum, melena, or hematemesis Neurologic:  No visual changes, wkns, changes in mental status. All other systems reviewed and are otherwise negative except as noted above.  Physical Exam    VS:  There were no vitals taken for this visit. , BMI There is no height or  weight on file to calculate BMI. GEN: Well nourished, well developed, in no acute distress. HEENT: normal. Neck: Supple, no JVD, carotid bruits, or masses. Cardiac: RRR, no murmurs, rubs, or gallops. No clubbing, cyanosis, bilateral lower extremity nonpitting edema.  Radials/DP/PT 2+ and equal bilaterally.  Respiratory:  Respirations regular and unlabored, clear to auscultation bilaterally. GI: Soft, nontender, nondistended, BS + x 4. MS: no deformity or atrophy. Skin: warm and dry, no rash. Neuro:  Strength and sensation are intact. Psych: Normal affect.  Accessory Clinical Findings    Recent Labs:  06/05/2021: ALT 16; B Natriuretic Peptide 738.0; TSH 1.493 06/12/2021: Magnesium 1.9 06/13/2021: BUN 20; Creatinine, Ser 1.08; Hemoglobin 12.9; Platelets 324; Potassium 4.2; Sodium 137   Recent Lipid Panel    Component Value Date/Time   CHOL 99 (L) 12/27/2018 1144   TRIG 43 12/27/2018 1144   HDL 49 12/27/2018 1144   CHOLHDL 2.0 12/27/2018 1144   LDLCALC 41 12/27/2018 1144    ECG personally reviewed by me today-none today.  Nuclear stress test 12/31/2016 Nuclear stress EF: 62%. There was no ST segment deviation noted during stress. This is a low risk study. The left ventricular ejection fraction is normal (55-65%).   1. EF 62%, normal wall motion.  2. Fixed small, mild mid anteroseptal and apical septal perfusion defect.  No evidence for ischemia.  Given normal wall motion, most likely attenuation.  Cannot rule out prior infarction.    Low risk study.     Echocardiogram 06/07/2021 IMPRESSIONS     1. Challenging images.   2. Left ventricular ejection fraction, by estimation, is 30 to 35%. The  left ventricle has moderately decreased function. The left ventricle  demonstrates global hypokinesis. Left ventricular diastolic parameters are  indeterminate.   3. Right ventricular systolic function is mildly reduced. The right  ventricular size is mildly enlarged.   4. Left  atrial size was mildly dilated.   5. The mitral valve is normal in structure. No evidence of mitral valve  regurgitation. No evidence of mitral stenosis.   6. The aortic valve was not well visualized. Aortic valve regurgitation  is not visualized. No aortic stenosis is present.  Assessment & Plan   1.  Atrial fibrillation with RVR-denies recent episodes of increased or irregular heartbeat.  Was admitted to the hospital on 06/06/2021 and discharged on 06/13/2021.  Received IV amiodarone, IV antibiotics, and was transitioned to metoprolol.  Okay to hold apixaban 2 days prior to urologic procedure.  No plans for further cardiac testing. Plan for DCCV 1 month after resuming apixaban Continue apixaban, metoprolol   Chronic diastolic CHF-continues to have bilateral lower extremity nonpitting.  Denies any recent increased activity intolerance or DOE.  Continues to maintain healthy weight.  Labs 11/16/2020 unremarkable Continue furosemide as needed-for lower extremity edema or weight increase of 3 pounds overnight or 5 pounds in 1 week Continue losartan Heart healthy low-sodium diet-salty 6 given Increase physical activity as tolerated Order BMP  Essential hypertension-BP today ***122/68.  Well-controlled at home. Heart healthy low-sodium diet-salty 6 given Increase physical activity as tolerated  Morbid obesity-weight today 240.  Has now lost 170 lb since gastric bypass surgery. Heart healthy low-sodium diet Increase physical activity     CHA2DS2-VASc Score = 3  This indicates a 3.2% annual risk of stroke. [name] score is based upon: CHF History: 1 HTN History: 1 Diabetes History: 0 Stroke History: 0 Vascular Disease History: 0 Age Score: 1 Gender Score: 0        Follow-up with Dr. Sallyanne Kuster or me after urologic procedure.   Jossie Ng. Dorri Ozturk NP-C    07/02/2021, 8:59 AM [place] [address] Office [phone] Fax [phone]  Notice: This dictation was prepared with Dragon dictation along with smaller phrase technology. Any transcriptional errors that result from this process are unintentional and may not be corrected upon review.  I spent 14 minutes examining this patient, reviewing medications, and using patient centered shared decision making involving her cardiac care.  Prior to her visit I  spent greater than 20 minutes reviewing her past medical history,  medications, and prior cardiac tests.

## 2021-07-03 ENCOUNTER — Ambulatory Visit: Payer: Medicare Other | Admitting: General Practice

## 2021-07-04 ENCOUNTER — Other Ambulatory Visit: Payer: Self-pay

## 2021-07-04 DIAGNOSIS — N201 Calculus of ureter: Secondary | ICD-10-CM

## 2021-07-05 LAB — CALCULI, WITH PHOTOGRAPH (CLINICAL LAB)
Calcium Oxalate Monohydrate: 100 %
Weight Calculi: 9 mg

## 2021-07-06 ENCOUNTER — Encounter: Payer: Self-pay | Admitting: Internal Medicine

## 2021-07-15 ENCOUNTER — Ambulatory Visit: Payer: Medicare Other

## 2021-07-24 ENCOUNTER — Telehealth: Payer: Self-pay | Admitting: *Deleted

## 2021-07-24 NOTE — Telephone Encounter (Signed)
Left Voicemail to return call to get RUS completed

## 2021-07-24 NOTE — Telephone Encounter (Signed)
Spoke with patient to get RUS completed provided radiology scheduling phone number, voiced understanding.

## 2021-07-26 ENCOUNTER — Inpatient Hospital Stay: Payer: Medicare Other | Admitting: Internal Medicine

## 2021-07-30 ENCOUNTER — Ambulatory Visit: Payer: Medicare Other | Admitting: Urology

## 2021-07-31 ENCOUNTER — Ambulatory Visit: Admission: RE | Admit: 2021-07-31 | Payer: 59 | Source: Ambulatory Visit

## 2021-07-31 NOTE — Progress Notes (Incomplete)
07/31/21 4:38 PM   [name] Apr 17, 1956 [id number]  Referring provider:  Ladell Pier, MD [place],   [id number] No chief complaint on file.    HPI: Brandon Marks is a 66 y.o.male who presents today for a 1 month follow-up post-operatively with RUS prior.   CT abdomen and pelvis on 06/05/2021 revealed hydronephrosis of the left kidney with the ureter dilated to the level of the lower pole of the kidney where there is a 13 mm stone causing obstruction.   He is s/p ureteroscopy, left ureteral stent exchange, left retrograde pyelogram, and basket extraction of stone on 07/01/2021. Intraoperative findings showed 13 mm stone seen in the proximal ureter, retropulsed to upper mid pole calyx with manipulation of wires and stent.  Completely obliterated and only dustlike material remained. No extravasation, stent replaced on tether. Stones were sent for analysis.   Stone analysis showed 100% calcium oxalate monohydrate stones.       PMH: Past Medical History:  Diagnosis Date   Arthritis    Coronary artery disease    CSF leak    a. 12/2019 following resection of vestibular schwannoma; b.  12/2020 status post lumbar puncture for ventriculomegaly; e.  01/2021: Status post lumbar drain ([facility]).  Pending VP shunt.   Herniated disc    High cholesterol    History of kidney stones    History of stress test    a. 09/2007 MV: EF 63%, small area of anterolateral and apical reversibility; b.  12/2016 MV: EF 62%.  Fixed small, mild mid anteroseptal and apical defect without reversibility.  Most likely attenuation.  Low risk study.   Hypertension    Morbid obesity ([place])    PAC (premature atrial contraction)    Persistent atrial fibrillation ([place])    a. first noted on 12 lead ECG 10/2019.   Sleep apnea    non compliant with c-pap   Vestibular schwannoma ([place])    a. 12/27/2019 s/p resection ([facility]). Post-op course complicated by CSF leak/seizures req repair 01/06/2020  and high volume LP on 01/16/2020.    Surgical History: 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 at [facility]  12/27/2019   accoustic neuroma   CARDIAC CATHETERIZATION  02/20/2009   normal coronary arteries   CYSTOSCOPY WITH STENT PLACEMENT Left 06/05/2021   Procedure: CYSTOSCOPY WITH STENT PLACEMENT;  Surgeon: Hollice Espy, MD;  Location: [facility];  Service: Urology;  Laterality: Left;   CYSTOSCOPY/URETEROSCOPY/HOLMIUM LASER/STENT PLACEMENT Left 07/01/2021   Procedure: CYSTOSCOPY/URETEROSCOPY/HOLMIUM LASER/STENT EXCHANGE;  Surgeon: Hollice Espy, MD;  Location: [facility];  Service: Urology;  Laterality: Left;   EYE SURGERY     bilateral cataract with lens implant   INCISION AND DRAINAGE PERIRECTAL ABSCESS N/A 01/02/2018   Procedure: IRRIGATION AND DEBRIDEMENT PERIRECTAL ABSCESS;  Surgeon: Coralie Keens, MD;  Location: [place];  Service: General;  Laterality: N/A;   IRRIGATION AND DEBRIDEMENT BUTTOCKS N/A 01/05/2018   Procedure: DEBRIDEMENT BUTTOCKS ABSCESS;  Surgeon: Georganna Skeans, MD;  Location: [place];  Service: General;  Laterality: N/A;   KNEE ARTHROSCOPY  2010   Lt   LEFT HEART CATH AND CORONARY ANGIOGRAPHY N/A 06/10/2021   Procedure: LEFT HEART CATH AND CORONARY ANGIOGRAPHY;  Surgeon: Wellington Hampshire, MD;  Location: [place] [facility];  Service: Cardiovascular;  Laterality: N/A;   NM MYOCAR PERF WALL MOTION  09/28/2007   small area of reversibility in the anterolateral wall at the apex concerning for ischemia  SHOULDER ARTHROSCOPY  10/2010   Rt   TOTAL KNEE ARTHROPLASTY Left 08/11/2019   Procedure: TOTAL KNEE ARTHROPLASTY;  Surgeon: Paralee Cancel, MD;  [facility];  Service: Orthopedics;  Laterality: Left;  70 mins   TOTAL KNEE ARTHROPLASTY Right 09/13/2019   Procedure: TOTAL KNEE ARTHROPLASTY;  Surgeon: Paralee Cancel, MD;  [facility];  Service: Orthopedics;  Laterality: Right;  70 mins   WOUND DEBRIDEMENT N/A 01/04/2018    Procedure: IRRIGATION AND DEBRIDEMENT OF BUTTOCKS AND REMOVAL OF TICK FROM RIGHT TESTICLE;  Surgeon: Georganna Skeans, MD;  Location: [place];  Service: General;  Laterality: N/A;    Home Medications:  Allergies as of 08/01/2021       Reactions   Codeine Nausea And Vomiting   Oxycodone Other (See Comments)   Upset GI        Medication List        Accurate as of July 31, 2021  4:38 PM. If you have any questions, ask your nurse or doctor.          acetaminophen 325 MG tablet Commonly known as: TYLENOL Take 325-650 mg by mouth every 6 (six) hours as needed for headache.   apixaban 5 MG Tabs tablet Commonly known as: ELIQUIS Take 1 tablet (5 mg total) by mouth 2 (two) times daily. What changed: when to take this   atorvastatin 40 MG tablet Commonly known as: LIPITOR Take 1 tablet (40 mg total) by mouth daily.   cyanocobalamin 1000 MCG tablet Take 3,000 mcg by mouth daily.   docusate sodium 100 MG capsule Commonly known as: COLACE Take 1 capsule (100 mg total) by mouth 2 (two) times daily as needed for mild constipation. What changed: when to take this   furosemide 20 MG tablet Commonly known as: LASIX Take 1 tablet (20 mg total) by mouth daily as needed for edema (for swelling in legs/ankle).   HYDROcodone-acetaminophen 5-325 MG tablet Commonly known as: NORCO/VICODIN Take 1-2 tablets by mouth every 6 (six) hours as needed for moderate pain.   losartan 25 MG tablet Commonly known as: COZAAR Take 0.5 tablets (12.5 mg total) by mouth daily.   metoprolol succinate 100 MG 24 hr tablet Commonly known as: TOPROL-XL Take 1 tablet (100 mg total) by mouth daily. Take with or immediately following a meal.        Allergies:  Allergies  Allergen Reactions   Codeine Nausea And Vomiting   Oxycodone Other (See Comments)    Upset GI    Family History: Family History  Problem Relation Age of Onset   Hypertension Mother    Heart failure Mother    Alzheimer's  disease Mother    Skin cancer Mother    Heart disease Father    Alzheimer's disease Maternal Grandmother    Alzheimer's disease Maternal Grandfather     Social History:  reports that he has never smoked. He has never used smokeless tobacco. He reports that he does not drink alcohol and does not use drugs.   Physical Exam: There were no vitals taken for this visit.  Constitutional:  Alert and oriented, No acute distress. HEENT: [place] AT, moist mucus membranes.  Trachea midline, no masses. Cardiovascular: No clubbing, cyanosis, or edema. Respiratory: Normal respiratory effort, no increased work of breathing. Skin: No rashes, bruises or suspicious lesions. Neurologic: Grossly intact, no focal deficits, moving all 4 extremities. Psychiatric: Normal mood and affect.  Laboratory Data:  Lab Results  Component Value Date   CREATININE 1.08 06/13/2021  Urinalysis   Pertinent Imaging:    Assessment & Plan:     No follow-ups on file.  I,Kailey Littlejohn,acting as a Education administrator for Hollice Espy, MD.,have documented all relevant documentation on the behalf of Hollice Espy, MD,as directed by  Hollice Espy, MD while in the presence of Hollice Espy, [place] [address] [place], [place] [address] [phone]

## 2021-08-01 ENCOUNTER — Encounter: Payer: Medicare Other | Admitting: Urology

## 2021-08-06 ENCOUNTER — Telehealth: Payer: Self-pay

## 2021-08-06 NOTE — Telephone Encounter (Signed)
Pt NS for RUS . I called patient to get ultrasound rescheduled for his appointment scheduled with [place]. Pt states he will call radiology

## 2021-08-13 ENCOUNTER — Telehealth: Payer: Self-pay | Admitting: Urology

## 2021-08-13 NOTE — Progress Notes (Incomplete)
08/13/21 1:50 PM   Brandon Marks [birth year] [id number]  Referring provider:  Ladell Pier, MD [place],  [place] [id number] No chief complaint on file.    HPI: Brandon Marks is a 66 y.o.male with a personal history of ureteral calculus, ho presents today for 1 month follow-up post-op with RUS prior.   He is s/p left ureteroscopy with laser lithotripsy with ureteral stent exchange, retrograde pyelogram, and basket extraction on 07/01/2021. Intraoperative findings showed a 13 mm stone seen in the proximal ureter, retropulsed to upper mid pole calyx with manipulation of wires and stent.  Completely obliterated and only dustlike material remained.  No extravasation, stent replaced on tether.  Stone analysis showed 100% calcium oxalate monohydrate stones.     PMH: Past Medical History:  Diagnosis Date   Arthritis    Coronary artery disease    CSF leak    a. 12/2019 following resection of vestibular schwannoma; b.  12/2020 status post lumbar puncture for ventriculomegaly; e.  01/2021: Status post lumbar drain ([facility]).  Pending VP shunt.   Herniated disc    High cholesterol    History of kidney stones    History of stress test    a. 09/2007 MV: EF 63%, small area of anterolateral and apical reversibility; b.  12/2016 MV: EF 62%.  Fixed small, mild mid anteroseptal and apical defect without reversibility.  Most likely attenuation.  Low risk study.   Hypertension    Morbid obesity ([place])    PAC (premature atrial contraction)    Persistent atrial fibrillation ([place])    a. first noted on 12 lead ECG 10/2019.   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 12/27/2019 s/p resection ([facility]). Post-op course complicated by CSF leak/seizures req repair 01/06/2020 and high volume LP on 01/16/2020.    Surgical History: 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 at [facility]  12/27/2019   accoustic neuroma    CARDIAC CATHETERIZATION  02/20/2009   normal coronary arteries   CYSTOSCOPY WITH STENT PLACEMENT Left 06/05/2021   Procedure: CYSTOSCOPY WITH STENT PLACEMENT;  Surgeon: Hollice Espy, MD;  Location: [facility];  Service: Urology;  Laterality: Left;   CYSTOSCOPY/URETEROSCOPY/HOLMIUM LASER/STENT PLACEMENT Left 07/01/2021   Procedure: CYSTOSCOPY/URETEROSCOPY/HOLMIUM LASER/STENT EXCHANGE;  Surgeon: Hollice Espy, MD;  Location: [facility];  Service: Urology;  Laterality: Left;   EYE SURGERY     bilateral cataract with lens implant   INCISION AND DRAINAGE PERIRECTAL ABSCESS N/A 01/02/2018   Procedure: IRRIGATION AND DEBRIDEMENT PERIRECTAL ABSCESS;  Surgeon: Coralie Keens, MD;  Location: [place];  Service: General;  Laterality: N/A;   IRRIGATION AND DEBRIDEMENT BUTTOCKS N/A 01/05/2018   Procedure: DEBRIDEMENT BUTTOCKS ABSCESS;  Surgeon: Georganna Skeans, MD;  Location: [place];  Service: General;  Laterality: N/A;   KNEE ARTHROSCOPY  2010   Lt   LEFT HEART CATH AND CORONARY ANGIOGRAPHY N/A 06/10/2021   Procedure: LEFT HEART CATH AND CORONARY ANGIOGRAPHY;  Surgeon: Wellington Hampshire, MD;  Location: [facility];  Service: Cardiovascular;  Laterality: N/A;   NM MYOCAR PERF WALL MOTION  09/28/2007   small area of reversibility in the anterolateral wall at the apex concerning for ischemia   SHOULDER ARTHROSCOPY  10/2010   Rt   TOTAL KNEE ARTHROPLASTY Left 08/11/2019   Procedure: TOTAL KNEE ARTHROPLASTY;  Surgeon: Paralee Cancel, MD;  Location: [facility];  Service: Orthopedics;  Laterality: Left;  70 mins   TOTAL KNEE ARTHROPLASTY Right 09/13/2019   Procedure: TOTAL KNEE ARTHROPLASTY;  Surgeon: Paralee Cancel, MD;  Location: [facility];  Service: Orthopedics;  Laterality: Right;  70 mins   WOUND DEBRIDEMENT N/A 01/04/2018   Procedure: IRRIGATION AND DEBRIDEMENT OF BUTTOCKS AND REMOVAL OF TICK FROM RIGHT TESTICLE;  Surgeon: Georganna Skeans, MD;  Location: [facility];  Service: General;  Laterality: N/A;     Home Medications:  Allergies as of 08/14/2021       Reactions   Codeine Nausea And Vomiting   Oxycodone Other (See Comments)   Upset GI        Medication List        Accurate as of August 13, 2021  1:50 PM. If you have any questions, ask your nurse or doctor.          acetaminophen 325 MG tablet Commonly known as: TYLENOL Take 325-650 mg by mouth every 6 (six) hours as needed for headache.   apixaban 5 MG Tabs tablet Commonly known as: ELIQUIS Take 1 tablet (5 mg total) by mouth 2 (two) times daily. What changed: when to take this   atorvastatin 40 MG tablet Commonly known as: LIPITOR Take 1 tablet (40 mg total) by mouth daily.   cyanocobalamin 1000 MCG tablet Take 3,000 mcg by mouth daily.   docusate sodium 100 MG capsule Commonly known as: COLACE Take 1 capsule (100 mg total) by mouth 2 (two) times daily as needed for mild constipation. What changed: when to take this   furosemide 20 MG tablet Commonly known as: LASIX Take 1 tablet (20 mg total) by mouth daily as needed for edema (for swelling in legs/ankle).   HYDROcodone-acetaminophen 5-325 MG tablet Commonly known as: NORCO/VICODIN Take 1-2 tablets by mouth every 6 (six) hours as needed for moderate pain.   losartan 25 MG tablet Commonly known as: COZAAR Take 0.5 tablets (12.5 mg total) by mouth daily.   metoprolol succinate 100 MG 24 hr tablet Commonly known as: TOPROL-XL Take 1 tablet (100 mg total) by mouth daily. Take with or immediately following a meal.        Allergies:  Allergies  Allergen Reactions   Codeine Nausea And Vomiting   Oxycodone Other (See Comments)    Upset GI    Family History: Family History  Problem Relation Age of Onset   Hypertension Mother    Heart failure Mother    Alzheimer's disease Mother    Skin cancer Mother    Heart disease Father    Alzheimer's disease Maternal Grandmother    Alzheimer's disease Maternal Grandfather     Social History:   reports that he has never smoked. He has never used smokeless tobacco. He reports that he does not drink alcohol and does not use drugs.   Physical Exam: There were no vitals taken for this visit.  Constitutional:  Alert and oriented, No acute distress. HEENT: [place] AT, moist mucus membranes.  Trachea midline, no masses. Cardiovascular: No clubbing, cyanosis, or edema. Respiratory: Normal respiratory effort, no increased work of breathing. Skin: No rashes, bruises or suspicious lesions. Neurologic: Grossly intact, no focal deficits, moving all 4 extremities. Psychiatric: Normal mood and affect.  Laboratory Data:  Lab Results  Component Value Date   CREATININE 1.08 06/13/2021    Urinalysis   Pertinent Imaging:    Assessment & Plan:     No follow-ups on file.  I,Kailey Littlejohn,acting as a Education administrator for Hollice Espy, MD.,have documented all relevant documentation on  the behalf of Hollice Espy, MD,as directed by  Hollice Espy, MD while in the presence of Hollice Espy, MD.   [facility] [address] [place], [place] [address] [phone]

## 2021-08-13 NOTE — Telephone Encounter (Signed)
Pt has a post op appt scheduled tomorrow w/Brandon.  He called office today to confirm appt time and I informed him he needed RUS.  Pt said he was 7 minutes late for RUS appt and they wouldn't see him.  So, I called scheduling and had RUS r/s.  When I called back to r/s appt w/Brandon, he said he really didn't think he needed to come to either, says he's doing fine.

## 2021-08-14 ENCOUNTER — Encounter: Payer: Medicaid Other | Admitting: Urology

## 2021-08-16 DIAGNOSIS — Z01818 Encounter for other preprocedural examination: Secondary | ICD-10-CM | POA: Insufficient documentation

## 2021-08-19 ENCOUNTER — Ambulatory Visit: Payer: Medicare Other

## 2021-08-28 DIAGNOSIS — Z982 Presence of cerebrospinal fluid drainage device: Secondary | ICD-10-CM | POA: Insufficient documentation

## 2021-10-14 IMAGING — MR MR HEAD WO/W CM
13 of 17 series · 38 of 48 positions shown · IV contrast (gadavist)
Comparison: 10/27/2019

CLINICAL DATA: Dizziness, history of acoustic neuroma

EXAM:
MRI HEAD WITHOUT AND WITH CONTRAST
TECHNIQUE: Multiplanar, multiecho pulse sequences of the brain and surrounding
structures were obtained without and with intravenous contrast.
CONTRAST:  10mL GADAVIST GADOBUTROL 1 MMOL/ML IV SOLN

[Series 9: DWI · axial · 3.0mm · 1.36mm/px · z∈[+37,+190]mm · 6 of 108 slices shown (1 of 2)]
[im 1/108]
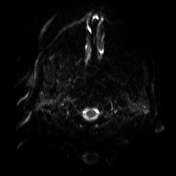
[im 22/108]
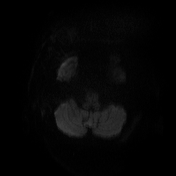
[im 43/108]
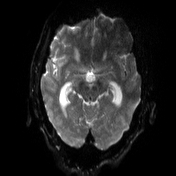
[im 65/108]
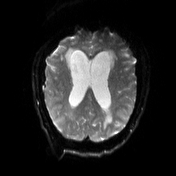
[im 86/108]
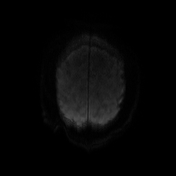
[im 108/108]
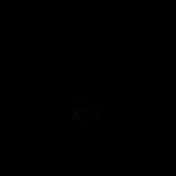

[Series 10: DWI · axial · 3.0mm · 1.36mm/px · z∈[+37,+190]mm · 3 of 54 slices shown (2 of 2)]
[im 1/54]
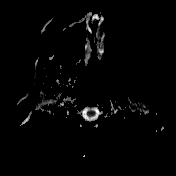
[im 27/54]
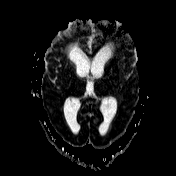
[im 54/54]
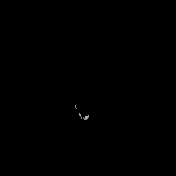

[Series 11: T1 · sagittal · 5.0mm · 0.75mm/px · 2 of 24 slices shown (1 of 3)]
[im 1/24]
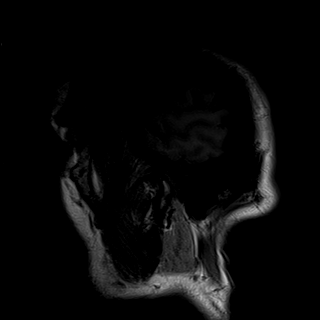
[im 24/24]
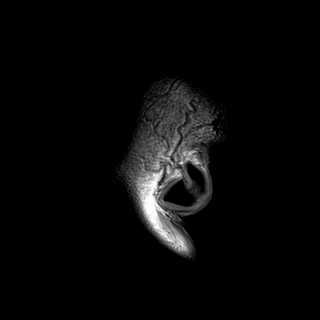

[Series 12: T2 · axial · 5.0mm · 0.62mm/px · z∈[+34,+185]mm · 2 of 25 slices shown]
[im 1/25]
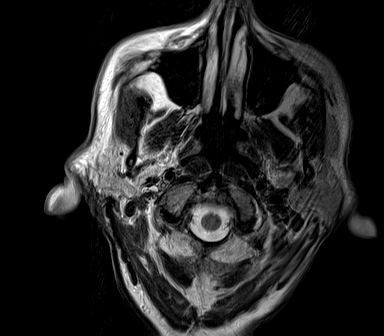
[im 25/25]
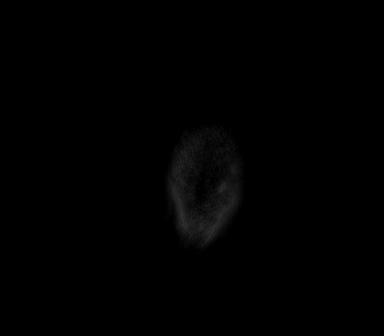

[Series 13: FLAIR · axial · 5.0mm · 0.75mm/px · 1 of 20 slices shown]
[im 1/20]
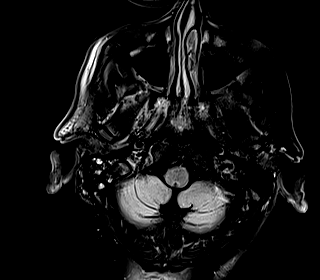

[Series 15: swi_images · axial · 3.0mm · 0.75mm/px · z∈[+27,+199]mm · 4 of 60 slices shown]
[im 1/60]
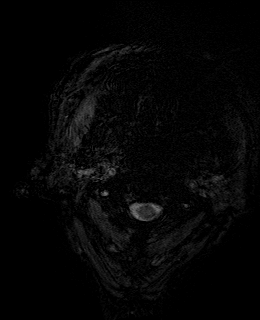
[im 20/60]
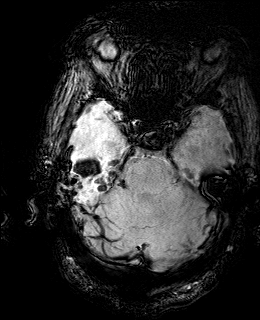
[im 40/60]
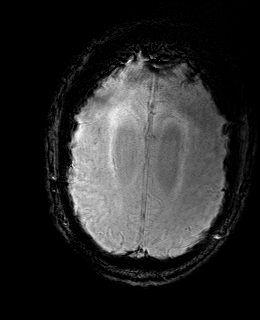
[im 60/60]
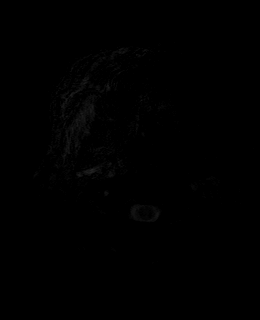

[Series 16: bSSFP · axial · 0.6mm · 0.56mm/px · z∈[+56,+90]mm · 4 of 60 slices shown]
[im 1/60]
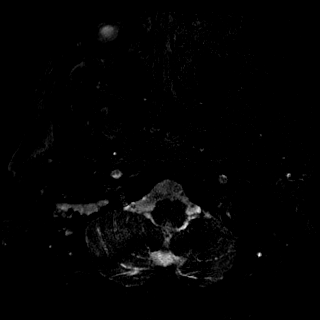
[im 20/60]
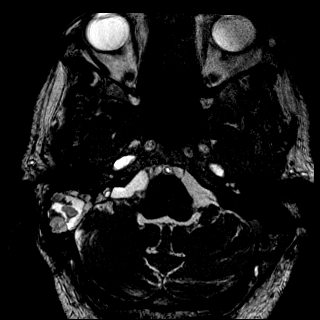
[im 40/60]
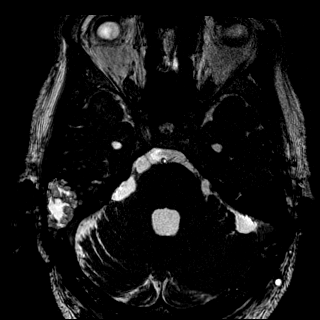
[im 60/60]
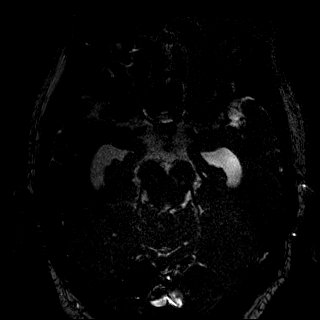

[Series 17: T1 · axial · 3.0mm · 0.33mm/px · 1 of 17 slices shown (2 of 3)]
[im 1/17]
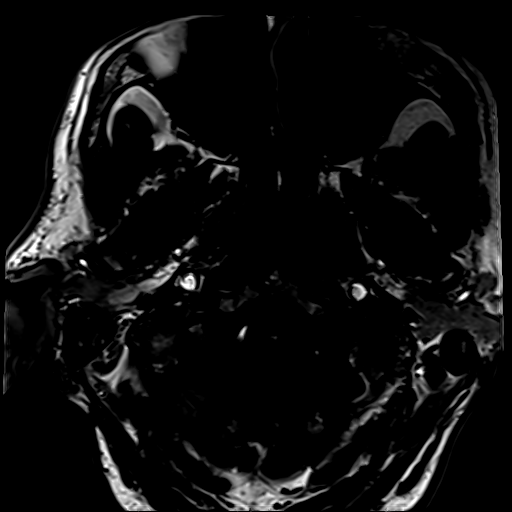

[Series 18: T1 · coronal · 3.0mm · 0.50mm/px · 1 of 19 slices shown (3 of 3)]
[im 1/19]
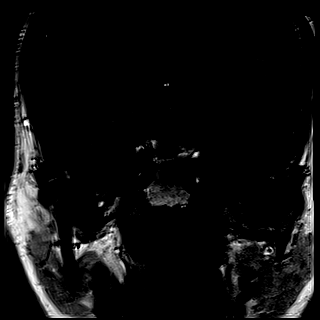

[Series 19: T1 post-contrast · coronal · 3.0mm · 0.50mm/px · 1 of 19 slices shown (1 of 4)]
[im 1/19]
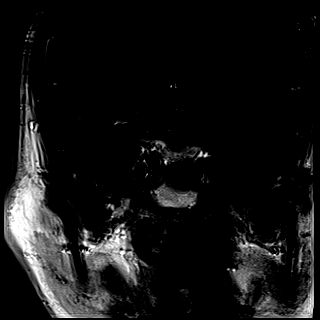

[Series 20: T1 post-contrast · axial · 3.0mm · 0.33mm/px · 1 of 17 slices shown (2 of 4)]
[im 1/17]
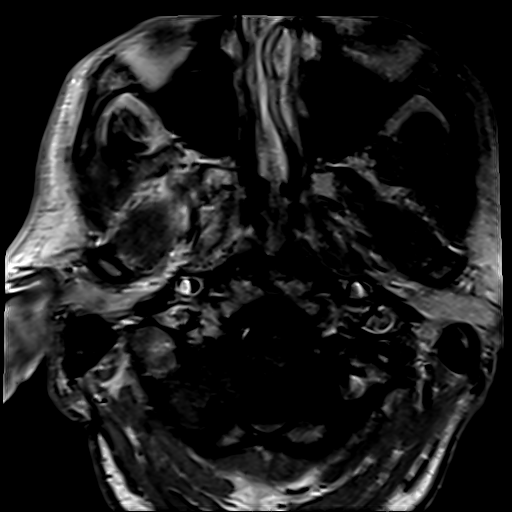

[Series 21: T1 post-contrast · axial · 1.0mm · 0.94mm/px · z∈[+27,+195]mm · 11 of 176 slices shown (3 of 4)]
[im 1/176]
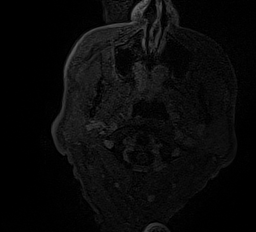
[im 18/176]
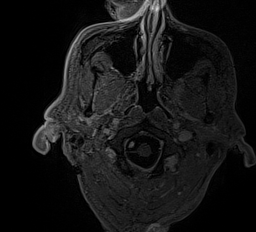
[im 36/176]
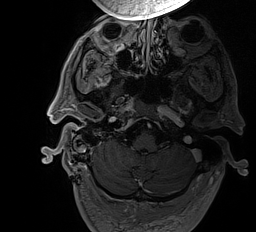
[im 53/176]
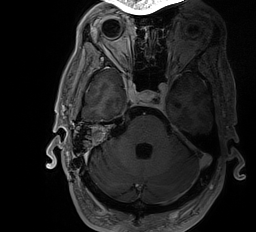
[im 71/176]
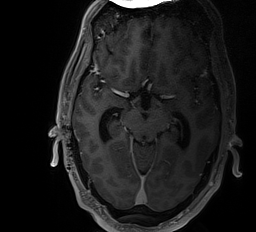
[im 88/176]
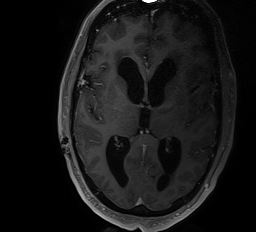
[im 106/176]
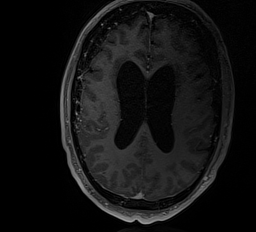
[im 123/176]
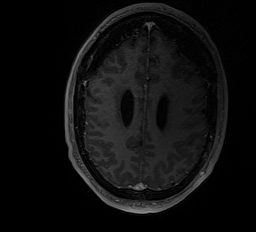
[im 141/176]
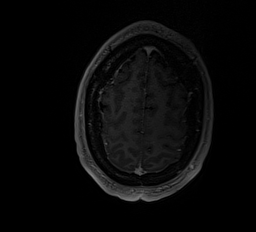
[im 158/176]
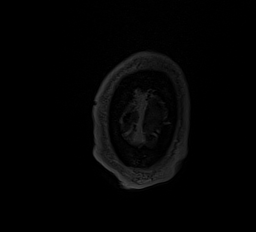
[im 176/176]
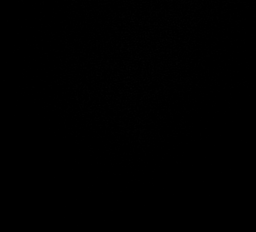

[Series 22: T1 post-contrast · coronal · 3.0mm · 0.50mm/px · 1 of 19 slices shown (4 of 4)]
[im 1/19]
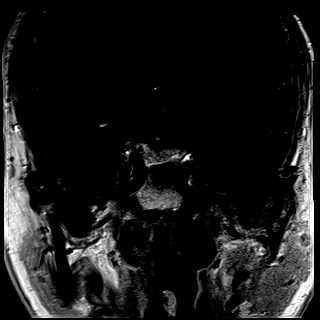

[38 of 48 positions shown; findings below may reference images not displayed]

FINDINGS: Motion artifact is present.

Brain: Since the prior study, there has been a translabyrinthine
approach resection of the right cerebellopontine angle vestibular
schwannoma. There is some enhancement at the resection cavity
margins with minimal nodularity primarily at the anterior and
margins. Mass effect on the brachium pontis has resolved. There is
some mass effect on the root entry zone of the right trigeminal
nerve. There is also distortion of the right [DATE] cranial nerve
complex.

There is new enlargement of the ventricle system. Additionally,
there is new periventricular T2 FLAIR hyperintensity.

No acute infarction. There are chronic blood products associated
with the resection cavity. Additional patchy foci of T2
hyperintensity in the supratentorial white matter are nonspecific
but probably reflect chronic microvascular ischemic changes.

Vascular: Major vessel flow voids at the skull base are preserved.

Skull and upper cervical spine: Normal marrow signal is preserved.

Sinuses/Orbits: Paranasal sinuses are clear. The orbits are
unremarkable.

Other: The sella is unremarkable. Postoperative changes involve the
right mastoid with T1 hyperintensity presumably reflecting fat
packing. Right semicircular canals are poorly visualized.
IMPRESSION: New enlargement of the ventricles with periventricular white matter
T2 hyperintensity reflecting hydrocephalus.

Interval resection of right vestibular schwannoma via
translabyrinthine approach. Enhancement at resection cavity margins
may be postoperative with minimal residual tumor not excluded.

Distortion of the right [DATE] cranial nerve complex. Poor
visualization of right semicircular canals.

These results will be called to the ordering clinician or
representative by the Radiologist Assistant, and communication
documented in the PACS or [REDACTED].

## 2021-10-16 DIAGNOSIS — Z9889 Other specified postprocedural states: Secondary | ICD-10-CM | POA: Diagnosis not present

## 2021-10-16 DIAGNOSIS — Z7901 Long term (current) use of anticoagulants: Secondary | ICD-10-CM | POA: Diagnosis not present

## 2021-10-16 DIAGNOSIS — Z86018 Personal history of other benign neoplasm: Secondary | ICD-10-CM | POA: Diagnosis not present

## 2021-10-16 DIAGNOSIS — I4891 Unspecified atrial fibrillation: Secondary | ICD-10-CM | POA: Diagnosis not present

## 2021-10-16 DIAGNOSIS — Z48811 Encounter for surgical aftercare following surgery on the nervous system: Secondary | ICD-10-CM | POA: Diagnosis not present

## 2021-10-16 DIAGNOSIS — Z982 Presence of cerebrospinal fluid drainage device: Secondary | ICD-10-CM | POA: Diagnosis not present

## 2021-12-03 ENCOUNTER — Other Ambulatory Visit: Payer: Self-pay | Admitting: General Practice

## 2021-12-04 NOTE — Telephone Encounter (Signed)
Prescription refill request for Eliquis received. ?Indication:Afib ?Last office visit:11/22 ?Scr:1.0 ?Age: 66 ?Weight:104.3 kg ? ?Prescription refilled ? ?

## 2022-02-13 DIAGNOSIS — D333 Benign neoplasm of cranial nerves: Secondary | ICD-10-CM | POA: Diagnosis not present

## 2022-02-13 DIAGNOSIS — Z982 Presence of cerebrospinal fluid drainage device: Secondary | ICD-10-CM | POA: Diagnosis not present

## 2022-02-13 DIAGNOSIS — Z86018 Personal history of other benign neoplasm: Secondary | ICD-10-CM | POA: Diagnosis not present

## 2022-03-20 ENCOUNTER — Telehealth: Payer: Self-pay | Admitting: Internal Medicine

## 2022-03-20 NOTE — Telephone Encounter (Signed)
Patient states [employer], [address], [place], [place] [address] phone # [phone] has been trying to reach PCP office regarding orders for a new power wheel chair. Patient states power wheel chair is broken. Patient would like PCP to contact supply company and find out what exactly is needed. Patient would like this request expedited

## 2022-03-20 NOTE — Telephone Encounter (Signed)
I spoke to [employer].  She explained that the patient is eligible for a new power wheelchair. He will need a face to face encounter with provider because he has not been seen in the last 4 months. She will fax the documentation that needs signature to Dr Wynetta Emery. I confirmed the office phone and fax number for her and she said she sent the documentation to the wrong doctor.   I called the patient and explained the need to see PCP before a new order can be placed.  He has been scheduled to see Dr Wynetta Emery - 03/24/2022 @ 1430.  I reminded him of the clinic's new address and he said he will be there.

## 2022-03-24 ENCOUNTER — Ambulatory Visit: Payer: Medicare HMO | Attending: Internal Medicine | Admitting: Internal Medicine

## 2022-03-24 ENCOUNTER — Encounter: Payer: Self-pay | Admitting: Internal Medicine

## 2022-03-24 VITALS — BP 135/93 | HR 114 | Ht 74.0 in | Wt 242.2 lb

## 2022-03-24 DIAGNOSIS — I428 Other cardiomyopathies: Secondary | ICD-10-CM

## 2022-03-24 DIAGNOSIS — Z9181 History of falling: Secondary | ICD-10-CM | POA: Diagnosis not present

## 2022-03-24 DIAGNOSIS — Z2821 Immunization not carried out because of patient refusal: Secondary | ICD-10-CM | POA: Diagnosis not present

## 2022-03-24 DIAGNOSIS — Z982 Presence of cerebrospinal fluid drainage device: Secondary | ICD-10-CM | POA: Diagnosis not present

## 2022-03-24 DIAGNOSIS — R42 Dizziness and giddiness: Secondary | ICD-10-CM | POA: Diagnosis not present

## 2022-03-24 DIAGNOSIS — R269 Unspecified abnormalities of gait and mobility: Secondary | ICD-10-CM

## 2022-03-24 DIAGNOSIS — I4819 Other persistent atrial fibrillation: Secondary | ICD-10-CM

## 2022-03-24 DIAGNOSIS — I5022 Chronic systolic (congestive) heart failure: Secondary | ICD-10-CM

## 2022-03-24 DIAGNOSIS — I10 Essential (primary) hypertension: Secondary | ICD-10-CM

## 2022-03-24 MED ORDER — METOPROLOL SUCCINATE ER 25 MG PO TB24: 25.0000 mg | ORAL_TABLET | Freq: Every day | ORAL | 1 refills | Status: DC

## 2022-03-24 MED ORDER — APIXABAN 5 MG PO TABS: 5.0000 mg | ORAL_TABLET | Freq: Two times a day (BID) | ORAL | 1 refills | Status: DC

## 2022-03-24 MED ORDER — ATORVASTATIN CALCIUM 10 MG PO TABS: 10.0000 mg | ORAL_TABLET | Freq: Every day | ORAL | 1 refills | Status: DC

## 2022-03-24 NOTE — Progress Notes (Signed)
Patient ID: Brandon Marks, male    DOB: [birth year]  MRN: [id number]  CC: Mobility eval  Subjective: Brandon Marks is a 66 y.o. male who presents for chronic disease management. His concerns today include:  Pt with hx of HTN, A.fib on systolic CHF with EF 87-68%, NICM, ureteral calculus OSA (has CPAP but not using.  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    Shunt of valve for VP adjusted 1 mth ago at [facility]. Dizziness if he turns around  fast.  Better  since having VP shunt Fell few wks ago, tripped over a stick in his yard.  Skinned RT arm a little.  Did not have his cane with him that day Has a walker but does not use it much Had a power chair x 10 yrs.  It has broken or worn out.  Uses it in his house and in his yard.   Feel in his house about 10 mths ago.  Tripped on the rug.  Has not used power chair as much since his shunt surgery but other days he needs it.  Has to be careful when stepping over curb.    Patient with history of HTN and atrial fibrillation.  He was hospitalized in November of last year with sepsis.  Echo done during that hospitalization revealed EF of 35-45%.  Cardiac catheterization showed minimal luminal irregularities with no evidence of obstructive CAD. He is supposed to be on atorvastatin, furosemide 20 mg as needed, Toprol XL 100 mg daily, Eliquis 5 mg twice a day and Cozaar 25 mg daily.  Out of meds he thinks for a couple of mths Lives alone with his dog.  Has a male friend who checks on him 3-4 days a wk.  He eats out most of the time.  He does some TV dinners -no palpitations, no CP.  No Le edema +SOB sometimes if he walks more than 1/2 a block.    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[place]) 02/22/2021   23-polyvalent pneumococcal polysaccharide vaccine declined 02/22/2021   Benign neoplasm of brain, unspecified brain region [facility]) 11/15/2020   Status post excision of acoustic neuroma 02/07/2020   S/P right TKA 09/13/2019   S/P left TKA 08/11/2019   Genu varum of both lower extremities 06/13/2019   Vitamin B12 deficiency 06/13/2019   Obesity (BMI 30-39.9) 12/27/2018   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   Tinnitus of right ear 12/27/2018   Gait disturbance 12/27/2018   Normochromic anemia 12/27/2018   Primary osteoarthritis of both knees 11/57/2620   Diastolic heart failure ([place]) 09/11/2017   DDD (degenerative disc disease), cervical 09/04/2017   Dyspnea, chronic DOE 02/27/2012   Herniated disc    Hyperlipidemia 35/59/7416   Diastolic dysfunction, left ventricle 02/23/2009   Essential hypertension 10/18/2007   OSA on CPAP 10/11/2007     Current Outpatient Medications on File Prior to Visit  Medication Sig  Dispense Refill   acetaminophen (TYLENOL) 325 MG tablet Take 325-650 mg by mouth every 6 (six) hours as needed for headache.     cyanocobalamin 1000 MCG tablet Take 3,000 mcg by mouth daily.     docusate sodium (COLACE) 100 MG capsule Take 1 capsule (100 mg total) by mouth 2 (two) times daily as needed for mild constipation. (Patient taking differently: Take 100 mg by mouth daily.) 10 capsule 0   furosemide (LASIX) 20 MG tablet Take 1 tablet (20 mg total) by mouth daily as needed for edema (for swelling in legs/ankle). 30 tablet 3   losartan (COZAAR) 25 MG tablet Take 0.5 tablets (12.5 mg total) by  mouth daily. 30 tablet 0   No current facility-administered medications on file prior to visit.    Allergies  Allergen Reactions   Codeine Nausea And Vomiting   Oxycodone Other (See Comments)    Upset GI    Social History   Socioeconomic History   Marital status: Divorced    Spouse name: Not on file   Number of children: 0   Years of education: some college   Highest education level: Not on file  Occupational History   Occupation: Retired  Tobacco Use   Smoking status: Never   Smokeless tobacco: Never  Vaping Use   Vaping Use: Never used  Substance and Sexual Activity   Alcohol use: No   Drug use: No   Sexual activity: Not on file  Other Topics Concern   Not on file  Social History Narrative   Denies caffeine use    Social Determinants of Health   Financial Resource Strain: Not on file  Food Insecurity: Not on file  Transportation Needs: Not on file  Physical Activity: Not on file  Stress: Not on file  Social Connections: Not on file  Intimate Partner Violence: Not on file    Family History  Problem Relation Age of Onset   Hypertension Mother    Heart failure Mother    Alzheimer's disease Mother    Skin cancer Mother    Heart disease Father    Alzheimer's disease Maternal Grandmother    Alzheimer's disease Maternal Grandfather     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 at [facility]  12/27/2019   accoustic neuroma   CARDIAC CATHETERIZATION  02/20/2009   normal coronary arteries   CYSTOSCOPY WITH STENT PLACEMENT Left 06/05/2021   Procedure: CYSTOSCOPY WITH STENT PLACEMENT;  Surgeon: Hollice Espy, MD;  Location: [facility];  Service: Urology;  Laterality: Left;   CYSTOSCOPY/URETEROSCOPY/HOLMIUM LASER/STENT PLACEMENT Left 07/01/2021   Procedure: CYSTOSCOPY/URETEROSCOPY/HOLMIUM LASER/STENT EXCHANGE;  Surgeon: Hollice Espy, MD;  Location: [facility];  Service: Urology;  Laterality: Left;   EYE SURGERY     bilateral  cataract with lens implant   INCISION AND DRAINAGE PERIRECTAL ABSCESS N/A 01/02/2018   Procedure: IRRIGATION AND DEBRIDEMENT PERIRECTAL ABSCESS;  Surgeon: Coralie Keens, MD;  Location: [place];  Service: General;  Laterality: N/A;   IRRIGATION AND DEBRIDEMENT BUTTOCKS N/A 01/05/2018   Procedure: DEBRIDEMENT BUTTOCKS ABSCESS;  Surgeon: Georganna Skeans, MD;  Location: [place];  Service: General;  Laterality: N/A;   KNEE ARTHROSCOPY  2010   Lt   LEFT HEART CATH AND CORONARY ANGIOGRAPHY N/A 06/10/2021   Procedure: LEFT HEART CATH AND CORONARY ANGIOGRAPHY;  Surgeon: Wellington Hampshire, MD;  Location: [place] [facility];  Service: Cardiovascular;  Laterality: N/A;   NM MYOCAR PERF WALL MOTION  09/28/2007   small  area of reversibility in the anterolateral wall at the apex concerning for ischemia   SHOULDER ARTHROSCOPY  10/2010   Rt   TOTAL KNEE ARTHROPLASTY Left 08/11/2019   Procedure: TOTAL KNEE ARTHROPLASTY;  Surgeon: Paralee Cancel, MD;  Location: [facility];  Service: Orthopedics;  Laterality: Left;  70 mins   TOTAL KNEE ARTHROPLASTY Right 09/13/2019   Procedure: TOTAL KNEE ARTHROPLASTY;  Surgeon: Paralee Cancel, MD;  Location: [facility];  Service: Orthopedics;  Laterality: Right;  70 mins   WOUND DEBRIDEMENT N/A 01/04/2018   Procedure: IRRIGATION AND DEBRIDEMENT OF BUTTOCKS AND REMOVAL OF TICK FROM RIGHT TESTICLE;  Surgeon: Georganna Skeans, MD;  Location: [place];  Service: General;  Laterality: N/A;    ROS: Review of Systems Negative except as stated above  PHYSICAL EXAM: BP (!) 135/93   Pulse (!) 114   Ht '6\' 2"'$  (1.88 m)   Wt 242 lb 3.2 oz (109.9 kg)   SpO2 99%   BMI 31.10 kg/m   Physical Exam   General appearance - alert, well appearing, older Caucasian male and in no distress Mental status -patient a bit forgetful but answers most questions appropriately. Neck - supple, no significant adenopathy Chest - clear to auscultation, no wheezes, rales or rhonchi, symmetric air entry Heart  -heart rate is irregularly irregular with mild tachycardia Neurological -gait: Patient is bowlegged at the knees.  He walks with slight flexion of his trunk forward.  Adequate foot to floor clearance.  Ambulates with a wooden cane. Musculoskeletal -grip 5/5 bilaterally.  Power in both upper and lower extremities 5/5 proximally and distally. Extremities -trace bilateral lower extremity and pedal edema.     Latest Ref Rng & Units 06/13/2021    6:48 AM 06/12/2021    3:31 AM 06/11/2021    4:52 AM  CMP  Glucose 70 - 99 mg/dL 76  76  77   BUN 8 - 23 mg/dL '20  23  26   '$ Creatinine 0.61 - 1.24 mg/dL 1.08  0.83  0.84   Sodium 135 - 145 mmol/L 137  138  139   Potassium 3.5 - 5.1 mmol/L 4.2  4.0  4.1   Chloride 98 - 111 mmol/L 102  106  108   CO2 22 - 32 mmol/L '27  26  27   '$ Calcium 8.9 - 10.3 mg/dL 8.0  8.1  8.2    Lipid Panel     Component Value Date/Time   CHOL 99 (L) 12/27/2018 1144   TRIG 43 12/27/2018 1144   HDL 49 12/27/2018 1144   CHOLHDL 2.0 12/27/2018 1144   LDLCALC 41 12/27/2018 1144    CBC    Component Value Date/Time   WBC 14.2 (H) 06/13/2021 0648   RBC 4.25 06/13/2021 0648   HGB 12.9 (L) 06/13/2021 0648   HGB 14.8 10/04/2020 1635   HCT 38.4 (L) 06/13/2021 0648   HCT 43.1 10/04/2020 1635   PLT 324 06/13/2021 0648   PLT 248 10/04/2020 1635   MCV 90.4 06/13/2021 0648   MCV 88 10/04/2020 1635   MCH 30.4 06/13/2021 0648   MCHC 33.6 06/13/2021 0648   RDW 14.6 06/13/2021 0648   RDW 14.1 10/04/2020 1635   LYMPHSABS 1.8 06/13/2021 0648   LYMPHSABS 1.2 10/04/2020 1635   MONOABS 1.0 06/13/2021 0648   EOSABS 0.1 06/13/2021 0648   EOSABS 0.0 10/04/2020 1635   BASOSABS 0.1 06/13/2021 0648   BASOSABS 0.0 10/04/2020 1635    ASSESSMENT AND PLAN:  1. Gait disturbance 2. Personal history  of fall -We will get patient in for PT evaluation for mobility chair.  He has good strength in the upper and lower extremities but may qualify based on history of having VP shunt placed for  normal pressure hydrocephalus with residual dizziness at times and fall in past yr.  Also history of nonischemic cardiomyopathy with reduced ejection fraction that decreases his endurance. - Ambulatory referral to Physical Therapy  3. Dizziness History of NPH status post shunt placement.  Patient reports dizziness has decreased a lot since placement. - Ambulatory referral to Physical Therapy  4. Essential hypertension Not at goal.  Will restart metoprolol but at a lower dose.  I sense that he has been off the metoprolol for longer than 1 month. - Comprehensive metabolic panel - CBC With Diff/Platelet - Lipid panel - metoprolol succinate (TOPROL-XL) 25 MG 24 hr tablet; Take 1 tablet (25 mg total) by mouth daily. Take with or immediately following a meal.  Dispense: 90 tablet; Refill: 1  5. Persistent atrial fibrillation ([place]) We will get him back in with cardiology.  Restart metoprolol and Eliquis.  Recommend that he uses his cane or better yet his walker consistently since we will be restarting the Eliquis.  Discussed the risks of bleeding with falls while on Eliquis. - metoprolol succinate (TOPROL-XL) 25 MG 24 hr tablet; Take 1 tablet (25 mg total) by mouth daily. Take with or immediately following a meal.  Dispense: 90 tablet; Refill: 1 - apixaban (ELIQUIS) 5 MG TABS tablet; Take 1 tablet (5 mg total) by mouth 2 (two) times daily.  Dispense: 180 tablet; Refill: 1 - Ambulatory referral to Cardiology  6. Chronic systolic congestive heart failure (HCC) Restart metoprolol, furosemide and low-dose Cozaar.  If he tolerates Cozaar, can change to Entresto   7. NICM (nonischemic cardiomyopathy) ([place]) - Ambulatory referral to Cardiology  8. VP (ventriculoperitoneal) shunt status - Ambulatory referral to Physical Therapy  9. Influenza vaccination declined Recommended.  Patient declined.   Patient was given the opportunity to ask questions.  Patient verbalized understanding of the plan and  was able to repeat key elements of the plan.   This documentation was completed using Radio producer.  Any transcriptional errors are unintentional.  Orders Placed This Encounter  Procedures   Comprehensive metabolic panel   CBC With Diff/Platelet   Lipid panel   Ambulatory referral to Cardiology   Ambulatory referral to Physical Therapy     Requested Prescriptions   Signed Prescriptions Disp Refills   metoprolol succinate (TOPROL-XL) 25 MG 24 hr tablet 90 tablet 1    Sig: Take 1 tablet (25 mg total) by mouth daily. Take with or immediately following a meal.   apixaban (ELIQUIS) 5 MG TABS tablet 180 tablet 1    Sig: Take 1 tablet (5 mg total) by mouth 2 (two) times daily.   atorvastatin (LIPITOR) 10 MG tablet 90 tablet 1    Sig: Take 1 tablet (10 mg total) by mouth daily.    Return in about 7 weeks (around 05/12/2022).  Karle Plumber, MD, FACP

## 2022-03-24 NOTE — Patient Instructions (Signed)
I have referred you to physical therapy to assess for need for motorized wheelchair.   I have sent refills on your medications to your pharmacy.   I have submitted referral for you to see the cardiologist

## 2022-03-25 LAB — CBC WITH DIFF/PLATELET
Basophils Absolute: 0 10*3/uL (ref 0.0–0.2)
Basos: 1 %
EOS (ABSOLUTE): 0 10*3/uL (ref 0.0–0.4)
Eos: 0 %
Hematocrit: 40.9 % (ref 37.5–51.0)
Hemoglobin: 13.3 g/dL (ref 13.0–17.7)
Immature Grans (Abs): 0 10*3/uL (ref 0.0–0.1)
Immature Granulocytes: 0 %
Lymphocytes Absolute: 1.2 10*3/uL (ref 0.7–3.1)
Lymphs: 17 %
MCH: 26.8 pg (ref 26.6–33.0)
MCHC: 32.5 g/dL (ref 31.5–35.7)
MCV: 83 fL (ref 79–97)
Monocytes Absolute: 0.6 10*3/uL (ref 0.1–0.9)
Monocytes: 8 %
Neutrophils Absolute: 5.5 10*3/uL (ref 1.4–7.0)
Neutrophils: 74 %
Platelets: 264 10*3/uL (ref 150–450)
RBC: 4.96 x10E6/uL (ref 4.14–5.80)
RDW: 15.5 % — ABNORMAL HIGH (ref 11.6–15.4)
WBC: 7.5 10*3/uL (ref 3.4–10.8)

## 2022-03-25 LAB — LIPID PANEL
Chol/HDL Ratio: 2.1 ratio (ref 0.0–5.0)
Cholesterol, Total: 111 mg/dL (ref 100–199)
HDL: 53 mg/dL (ref >39)
LDL Chol Calc (NIH): 47 mg/dL (ref 0–99)
Triglycerides: 42 mg/dL (ref 0–149)
VLDL Cholesterol Cal: 11 mg/dL (ref 5–40)

## 2022-03-25 LAB — COMPREHENSIVE METABOLIC PANEL
ALT: 12 IU/L (ref 0–44)
AST: 16 IU/L (ref 0–40)
Albumin/Globulin Ratio: 1.4 (ref 1.2–2.2)
Albumin: 4.3 g/dL (ref 3.9–4.9)
Alkaline Phosphatase: 85 IU/L (ref 44–121)
BUN/Creatinine Ratio: 16 (ref 10–24)
BUN: 16 mg/dL (ref 8–27)
Bilirubin Total: 0.6 mg/dL (ref 0.0–1.2)
CO2: 23 mmol/L (ref 20–29)
Calcium: 9.1 mg/dL (ref 8.6–10.2)
Chloride: 103 mmol/L (ref 96–106)
Creatinine, Ser: 1.02 mg/dL (ref 0.76–1.27)
Globulin, Total: 3.1 g/dL (ref 1.5–4.5)
Glucose: 79 mg/dL (ref 70–99)
Potassium: 5 mmol/L (ref 3.5–5.2)
Sodium: 141 mmol/L (ref 134–144)
Total Protein: 7.4 g/dL (ref 6.0–8.5)
eGFR: 80 mL/min/{1.73_m2} (ref >59)

## 2022-03-26 ENCOUNTER — Telehealth: Payer: Self-pay | Admitting: *Deleted

## 2022-03-26 NOTE — Telephone Encounter (Signed)
C/o runny nose and ears felling full.  Would like to know what he can take OTC to help with sxs.

## 2022-03-26 NOTE — Telephone Encounter (Signed)
-----   Message from Ladell Pier, MD sent at 03/25/2022  8:48 AM EDT ----- Let patient know that his blood cell counts are normal meaning no anemia.  Kidney and liver function tests are normal.  Cholesterol levels are normal.

## 2022-04-02 NOTE — Telephone Encounter (Signed)
Spoke to patient regarding message from Dr. Wynetta Emery.

## 2022-04-16 ENCOUNTER — Ambulatory Visit: Payer: Medicare HMO | Attending: Physician Assistant | Admitting: Physician Assistant

## 2022-04-16 NOTE — Progress Notes (Signed)
This encounter was created in error - please disregard.

## 2022-04-23 ENCOUNTER — Telehealth: Payer: Self-pay | Admitting: Internal Medicine

## 2022-04-23 NOTE — Telephone Encounter (Signed)
Lorriane Shire from [employer] calling to follow up on forms for the pt's power wheel chair.  Faxed on 03/20/2022.  Cb [phone]  please call her and let her know the status, b/c pt saw Dr Wynetta Emery on 8/28 for face to face.

## 2022-04-24 NOTE — Telephone Encounter (Signed)
I spoke to [employer] and explained that Dr Wynetta Emery would like the patient to be evaluated by PT to confirm the patient's need for a power wheelchair.  Lorriane Shire said that the provider will usually send them an order for the eval and they will schedule it and they (vendor) need to be present for the eval.  I again explained that Dr Wynetta Emery would like PT to evaluate him first before the order is confirmed.  I spoke  with Dr Wynetta Emery that she would like PT to evaluate him for the need for the power wheelchair.  I called patient to inform him that PT has been trying to reach him to schedule the eval, but they have been unsuccessful and unable to leave him a message. He confirmed that the number I reached him on [phone] is his only number.  He will answer calls and also said that he can receive text messages. The second number he had listed in Epic is not in service and Epic was updated.

## 2022-04-25 ENCOUNTER — Ambulatory Visit: Payer: Medicare HMO | Attending: General Practice | Admitting: General Practice

## 2022-04-25 ENCOUNTER — Encounter: Payer: Self-pay | Admitting: General Practice

## 2022-04-25 VITALS — BP 132/86 | HR 95 | Ht 74.0 in | Wt 253.0 lb

## 2022-04-25 DIAGNOSIS — E782 Mixed hyperlipidemia: Secondary | ICD-10-CM | POA: Diagnosis not present

## 2022-04-25 DIAGNOSIS — I10 Essential (primary) hypertension: Secondary | ICD-10-CM | POA: Diagnosis not present

## 2022-04-25 DIAGNOSIS — I480 Paroxysmal atrial fibrillation: Secondary | ICD-10-CM

## 2022-04-25 DIAGNOSIS — I5041 Acute combined systolic (congestive) and diastolic (congestive) heart failure: Secondary | ICD-10-CM | POA: Diagnosis not present

## 2022-04-25 NOTE — Patient Instructions (Addendum)
Medication Instructions:  The current medical regimen is effective;  continue present plan and medications as directed. Please refer to the Current Medication list given to you today.  *If you need a refill on your cardiac medications before your next appointment, please call your pharmacy*  Other Instructions [place]  Lab Work:       NONE  Testing/Procedures: NONE   Follow-Up: At [facility], you and your health needs are our priority.  As part of our continuing mission to provide you with exceptional heart care, we have created designated Provider Care Teams.  These Care Teams include your primary Cardiologist (physician) and Advanced Practice Providers (APPs -  Physician Assistants and Nurse Practitioners) who all work together to provide you with the care you need, when you need it.  Your next appointment:   9 month(s)  The format for your next appointment:   In Person  Provider:   Sanda Klein, MD    Important Information About [place]

## 2022-04-25 NOTE — Progress Notes (Signed)
Cardiology Clinic Note   Patient Name: Brandon Marks Date of Encounter: 04/25/2022  Primary Care Provider:  Ladell Pier, MD Primary Cardiologist:  Sanda Klein, MD  Patient Profile    Brandon Marks 66 year old male presents for follow-up for chronic diastolic CHF and preoperative cardiac evaluation  Past Medical History    Past Medical History:  Diagnosis Date   Arthritis    Coronary artery disease    CSF leak    a. 12/2019 following resection of vestibular schwannoma; b.  12/2020 status post lumbar puncture for ventriculomegaly; e.  01/2021: Status post lumbar drain ([facility]).  Pending VP shunt.   Herniated disc    High cholesterol    History of kidney stones    History of stress test    a. 09/2007 MV: EF 63%, small area of anterolateral and apical reversibility; b.  12/2016 MV: EF 62%.  Fixed small, mild mid anteroseptal and apical defect without reversibility.  Most likely attenuation.  Low risk study.   Hypertension    Morbid obesity ([place])    PAC (premature atrial contraction)    Persistent atrial fibrillation ([place])    a. first noted on 12 lead ECG 10/2019.   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 12/27/2019 s/p resection ([facility]). Post-op course complicated by CSF leak/seizures req repair 01/06/2020 and high volume LP on 01/16/2020.   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 at [facility]  12/27/2019   accoustic neuroma   CARDIAC CATHETERIZATION  02/20/2009   normal coronary arteries   CYSTOSCOPY WITH STENT PLACEMENT Left 06/05/2021   Procedure: CYSTOSCOPY WITH STENT PLACEMENT;  Surgeon: Hollice Espy, MD;  Location: [facility];  Service: Urology;  Laterality: Left;   CYSTOSCOPY/URETEROSCOPY/HOLMIUM LASER/STENT PLACEMENT Left 07/01/2021   Procedure: CYSTOSCOPY/URETEROSCOPY/HOLMIUM LASER/STENT EXCHANGE;  Surgeon: Hollice Espy, MD;  Location: [facility];  Service: Urology;  Laterality: Left;    EYE SURGERY     bilateral cataract with lens implant   INCISION AND DRAINAGE PERIRECTAL ABSCESS N/A 01/02/2018   Procedure: IRRIGATION AND DEBRIDEMENT PERIRECTAL ABSCESS;  Surgeon: Coralie Keens, MD;  Location: [place];  Service: General;  Laterality: N/A;   IRRIGATION AND DEBRIDEMENT BUTTOCKS N/A 01/05/2018   Procedure: DEBRIDEMENT BUTTOCKS ABSCESS;  Surgeon: Georganna Skeans, MD;  Location: [place];  Service: General;  Laterality: N/A;   KNEE ARTHROSCOPY  2010   Lt   LEFT HEART CATH AND CORONARY ANGIOGRAPHY N/A 06/10/2021   Procedure: LEFT HEART CATH AND CORONARY ANGIOGRAPHY;  Surgeon: Wellington Hampshire, MD;  Location: [place] [facility];  Service: Cardiovascular;  Laterality: N/A;   NM MYOCAR PERF WALL MOTION  09/28/2007   small area of reversibility in the anterolateral wall at the apex concerning for ischemia   SHOULDER ARTHROSCOPY  10/2010   Rt   TOTAL KNEE ARTHROPLASTY Left 08/11/2019   Procedure: TOTAL KNEE ARTHROPLASTY;  Surgeon: Paralee Cancel, MD;  Location: [facility];  Service: Orthopedics;  Laterality: Left;  70 mins   TOTAL KNEE ARTHROPLASTY Right 09/13/2019   Procedure: TOTAL KNEE ARTHROPLASTY;  Surgeon: Paralee Cancel, MD;  Location: [facility];  Service: Orthopedics;  Laterality: Right;  70 mins   WOUND DEBRIDEMENT N/A 01/04/2018   Procedure: IRRIGATION AND DEBRIDEMENT OF BUTTOCKS AND REMOVAL OF TICK FROM RIGHT TESTICLE;  Surgeon: Georganna Skeans, MD;  Location: [place];  Service: General;  Laterality: N/A;    Allergies  Allergies  Allergen Reactions  Codeine Nausea And Vomiting   Oxycodone Other (See Comments)    Upset GI    History of Present Illness    Brandon Marks is a 66 y.o. male with a hx of hypertension, chronic diastolic CHF, HLD, OSA, and morbid obesity.  He underwent cardiac catheterization in 2010 which showed normal coronary arteries after a false positive nuclear stress test.  Stress test was completed in anticipation of bariatric surgery.  He underwent  repeat stress testing in 2018 which showed low risk and was negative for ischemia.  He was seen by Richardson Dopp to/19 for preoperative cardiac evaluation.  He was cleared for gastric bypass at that time without further ischemic evaluation.  He underwent surgery 2/19.  He was readmitted 6/19 with necrotizing fasciitis of the right buttock.  He underwent I&D with debridement and was treated with antibiotics for septic shock.  A tick was found on him in the operating room.  He received prophylactic course of doxycycline.  He was noted to have kidney injury as a result of septic shock.  On arrival his creatinine was 2.1 which improved to 0.89 with fluid resuscitation/hydration.  He followed up with Almyra Deforest, PA-C on 03/14/2019.  During that time he reported he had lost about 140 pounds after his bariatric surgery.  He has not had any further problems with infection.  His blood pressure was well controlled.  He did not have any lower extremity swelling, denied orthopnea PND.  He denied recent episodes of chest discomfort.  He presented to the clinic 05/31/2021 for follow-up evaluation and preoperative cardiac evaluation.  He stated he felt well aside from some slight dizziness.  He was prepared for shunt placement.  He was having his procedure done at [facility].  He had been somewhat physically active and is able to climb 2 stairs without chest pain or increased work of breathing.  He reported compliance with his apixaban and denied bleeding issues.  We reviewed recommendations from pharmacy prior to his upcoming surgery.  His blood pressure had been well controlled.  He was cardiac unaware.  I will gave him the salty 6 diet sheet, had him maintain his physical activity, had him continue his heart healthy low-sodium diet, and planned follow-up in 6 to 9 months.  He was admitted to the hospital 11/22 after being seen in the clinic.  He was noted to have atrial fibrillation with RVR.  He was diagnosed with bacteremia and  Klebsiella pneumonia.  He was managed by infectious disease and treated with antibiotics.  He was planned for urologic procedure to treat a left ureteral stone.  He presents to the clinic today for follow-up evaluation states he is doing well.  He is very active.  He worked on a Engineer, technical sales and also went to see Pierre in [place].  His blood pressure is well controlled at 132/86.  He does note some shortness of breath with increased physical activity however has no shortness of breath with regular activities.  His EKG today shows atrial fibrillation at 95 bpm.  He reports compliance with his apixaban and denies bleeding issues.  We reviewed his hospitalization last November.  He expressed understanding.  He tolerated his shunt placement well.  He has been eating a high-fiber diet.  I will have him maintain his current physical activity, continue his current medications and plan follow-up in 9 months.  Today he denies chest pain, shortness of breath, lower extremity edema, fatigue, palpitations, melena, hematuria, hemoptysis, diaphoresis, weakness, presyncope,  syncope, orthopnea, and PND.  Home Medications    Prior to Admission medications   Medication Sig Start Date End Date Taking? Authorizing Provider  apixaban (ELIQUIS) 5 MG TABS tablet Take 1 tablet (5 mg total) by mouth 2 (two) times daily. 03/22/21   Deberah Pelton, NP  diltiazem (CARDIZEM CD) 180 MG 24 hr capsule Take 1 capsule (180 mg total) by mouth daily. 04/01/21 05/01/21  Gareth Morgan, MD  furosemide (LASIX) 20 MG tablet Take 1 tablet (20 mg total) by mouth daily as needed for edema (for swelling in legs/ankle). Patient not taking: Reported on 02/22/2021 11/15/20   Ladell Pier, MD    Family History    Family History  Problem Relation Age of Onset   Hypertension Mother    Heart failure Mother    Alzheimer's disease Mother    Skin cancer Mother    Heart disease Father    Alzheimer's disease Maternal Grandmother     Alzheimer's disease Maternal Grandfather    He indicated that his mother is alive. He indicated that his father is deceased. He indicated that his brother is alive. He indicated that his maternal grandmother is deceased. He indicated that his maternal grandfather is deceased. He indicated that his paternal grandmother is deceased. He indicated that his paternal grandfather is deceased.   Social History    Social History   Socioeconomic History   Marital status: Divorced    Spouse name: Not on file   Number of children: 0   Years of education: some college   Highest education level: Not on file  Occupational History   Occupation: Retired  Tobacco Use   Smoking status: Never   Smokeless tobacco: Never  Vaping Use   Vaping Use: Never used  Substance and Sexual Activity   Alcohol use: No   Drug use: No   Sexual activity: Not on file  Other Topics Concern   Not on file  Social History Narrative   Denies caffeine use    Social Determinants of Health   Financial Resource Strain: Not on file  Food Insecurity: Not on file  Transportation Needs: Not on file  Physical Activity: Not on file  Stress: Not on file  Social Connections: Not on file  Intimate Partner Violence: Not on file     Review of Systems    General:  No chills, fever, night sweats or weight changes.  Cardiovascular:  No chest pain, dyspnea on exertion, edema, orthopnea, palpitations, paroxysmal nocturnal dyspnea. Dermatological: No rash, lesions/masses Respiratory: No cough, dyspnea Urologic: No hematuria, dysuria Abdominal:   No nausea, vomiting, diarrhea, bright red blood per rectum, melena, or hematemesis Neurologic:  No visual changes, wkns, changes in mental status. All other systems reviewed and are otherwise negative except as noted above.  Physical Exam    VS:  BP 132/86   Pulse 95   Ht '6\' 2"'$  (1.88 m)   Wt 253 lb (114.8 kg)   SpO2 98%   BMI 32.48 kg/m  , BMI Body mass index is 32.48  kg/m. GEN: Well nourished, well developed, in no acute distress. HEENT: normal. Neck: Supple, no JVD, carotid bruits, or masses. Cardiac: RRR, no murmurs, rubs, or gallops. No clubbing, cyanosis, bilateral lower extremity nonpitting edema.  Radials/DP/PT 2+ and equal bilaterally.  Respiratory:  Respirations regular and unlabored, clear to auscultation bilaterally. GI: Soft, nontender, nondistended, BS + x 4. MS: no deformity or atrophy. Skin: warm and dry, no rash. Neuro:  Strength and sensation are  intact. Psych: Normal affect.  Accessory Clinical Findings    Recent Labs: 06/05/2021: B Natriuretic Peptide 738.0; TSH 1.493 06/12/2021: Magnesium 1.9 03/24/2022: ALT 12; BUN 16; Creatinine, Ser 1.02; Hemoglobin 13.3; Platelets 264; Potassium 5.0; Sodium 141   Recent Lipid Panel    Component Value Date/Time   CHOL 111 03/24/2022 1540   TRIG 42 03/24/2022 1540   HDL 53 03/24/2022 1540   CHOLHDL 2.1 03/24/2022 1540   LDLCALC 47 03/24/2022 1540    ECG personally reviewed by me today-EKG today shows atrial fibrillation rightward axis deviation nonspecific ST and T wave abnormality 95 bpm  Nuclear stress test 12/31/2016 Nuclear stress EF: 62%. There was no ST segment deviation noted during stress. This is a low risk study. The left ventricular ejection fraction is normal (55-65%).   1. EF 62%, normal wall motion.  2. Fixed small, mild mid anteroseptal and apical septal perfusion defect.  No evidence for ischemia.  Given normal wall motion, most likely attenuation.  Cannot rule out prior infarction.    Low risk study.    Echocardiogram 06/07/2021  IMPRESSIONS     1. Challenging images.   2. Left ventricular ejection fraction, by estimation, is 30 to 35%. The  left ventricle has moderately decreased function. The left ventricle  demonstrates global hypokinesis. Left ventricular diastolic parameters are  indeterminate.   3. Right ventricular systolic function is mildly reduced.  The right  ventricular size is mildly enlarged.   4. Left atrial size was mildly dilated.   5. The mitral valve is normal in structure. No evidence of mitral valve  regurgitation. No evidence of mitral stenosis.   6. The aortic valve was not well visualized. Aortic valve regurgitation  is not visualized. No aortic stenosis is present.   Assessment & Plan   1.  Combined systolic and diastolic CHF-bilateral lower extremity nonpitting ankle edema.  Denies any recent increased activity intolerance or DOE.    Remains physically active working on cars and going to shows.  Echocardiogram 06/07/2021 showed an LVEF of 30-35% and intermediate diastolic parameters.  Details above. Continue furosemide, losartan, metoprolol Heart healthy low-sodium diet-salty 6 reviewed Increase physical activity as tolerated  Essential hypertension-BP today 132/86.  Continues to be well-controlled at home. Continue losartan, metoprolol Heart healthy low-sodium diet Increase physical activity as tolerated  Hyperlipidemia-LDL 41 on 12/27/2018. Heart healthy low-sodium high-fiber diet Increase physical activity as tolerated Follows with PCP  Morbid obesity-weight today 253.  Status post gastric bypass surgery. Heart healthy low-sodium diet Increase physical activity  Atrial fibrillation-heart rate today 95.  Reports compliance with apixaban and denies bleeding issues. Continue apixaban, metoprolol Heart healthy low-sodium high-fiber diet Maintain physical activity Avoid triggers caffeine, chocolate, EtOH, dehydration etc.   Follow-up with Dr. Sallyanne Kuster or me in 9 months.   Jossie Ng. Issachar Broady NP-C    04/25/2022, 10:46 AM [place] [address] Office [phone] Fax [phone]  Notice: This dictation was prepared with Dragon dictation along with smaller phrase technology. Any transcriptional errors that result from this process are unintentional and may not be  corrected upon review.  I spent 14 minutes examining this patient, reviewing medications, and using patient centered shared decision making involving her cardiac care.  Prior to her visit I spent greater than 20 minutes reviewing her past medical history,  medications, and prior cardiac tests.

## 2022-05-01 NOTE — Telephone Encounter (Signed)
I spoke to [employer] and informed her that I spoke  with Dr Wynetta Emery that she would like PT to evaluate him for the need for the power wheelchair.

## 2022-05-30 ENCOUNTER — Encounter: Payer: Self-pay | Admitting: Internal Medicine

## 2022-05-30 ENCOUNTER — Ambulatory Visit: Payer: Medicare HMO | Attending: Internal Medicine | Admitting: Internal Medicine

## 2022-05-30 VITALS — BP 130/100 | HR 83 | Wt 257.2 lb

## 2022-05-30 DIAGNOSIS — I4819 Other persistent atrial fibrillation: Secondary | ICD-10-CM

## 2022-05-30 DIAGNOSIS — I5022 Chronic systolic (congestive) heart failure: Secondary | ICD-10-CM | POA: Diagnosis not present

## 2022-05-30 DIAGNOSIS — Z2821 Immunization not carried out because of patient refusal: Secondary | ICD-10-CM | POA: Diagnosis not present

## 2022-05-30 DIAGNOSIS — R269 Unspecified abnormalities of gait and mobility: Secondary | ICD-10-CM | POA: Diagnosis not present

## 2022-05-30 DIAGNOSIS — L299 Pruritus, unspecified: Secondary | ICD-10-CM | POA: Diagnosis not present

## 2022-05-30 DIAGNOSIS — I10 Essential (primary) hypertension: Secondary | ICD-10-CM | POA: Diagnosis not present

## 2022-05-30 MED ORDER — FUROSEMIDE 20 MG PO TABS: 20.0000 mg | ORAL_TABLET | Freq: Every day | ORAL | 3 refills | Status: DC | PRN

## 2022-05-30 MED ORDER — LOSARTAN POTASSIUM 25 MG PO TABS: 12.5000 mg | ORAL_TABLET | Freq: Every day | ORAL | 3 refills | Status: DC

## 2022-05-30 MED ORDER — LORATADINE 10 MG PO TABS: 10.0000 mg | ORAL_TABLET | Freq: Every day | ORAL | 2 refills | Status: DC

## 2022-05-30 NOTE — Patient Instructions (Signed)
Your blood pressure is not at goal which is 130/80 or lower.  Besides the metoprolol, you should also be on Losartan.  Refill sent on this medicine to your pharmacy.  I have also sent a prescription to your pharmacy for Claritin to see if it would help decrease the itching.

## 2022-05-30 NOTE — Progress Notes (Signed)
Patient ID: Brandon Marks, male    DOB: May 05, 1956  MRN: [id number]  CC: No chief complaint on file.   Subjective: Brandon Marks is a 66 y.o. male who presents for chronic ds management His concerns today include:  Pt with hx of HTN, A.fib on systolic CHF with EF 93-26%, NICM, ureteral calculus OSA (has CPAP but not using.  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    Patient complains of itching on various parts of his body over the past 7 to 10 days. -No change in soaps or lotion.  He is not sure whether his washing detergent has changed.  No rash.  Some nasal congestion with itchy eyes.  No itchy throat.  Occasional sneezing.  HTN/CHF: Saw cardiology nurse practitioner in September of this year. Supposed to be on metoprolol 25 mg daily and Cozaar 25 mg half a tablet daily.  He is taking metoprolol but not sure that he has the Cozaar.  He tells me that he does not have furosemide and has not been taking that.  He takes his Eliquis twice a day as prescribed.  Denies any excessive bruising and no bleeding on Eliquis.  He also takes Lipitor for his cholesterol. -Denies any lower extremity edema.  Little shortness of breath if he tries to do too much. -No PND orthopnea.  No palpitations.  On last visit, I had referred him to physical therapy for evaluation for mobility chair.  He has a mobility chair that is at the end of its life.  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  He has residual dizziness at times but not as bad as it was prior to shunt placement.  He had fall in the past year.  No falls since last visit with me.  Physical therapy was unable to reach him via phone.  HM: He declines flu, pneumonia and shingles vaccine.  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[place]) 02/22/2021   23-polyvalent pneumococcal polysaccharide vaccine declined 02/22/2021   Benign neoplasm of brain, unspecified brain region [facility]) 11/15/2020   Status post excision of acoustic neuroma 02/07/2020   S/P right TKA 09/13/2019   S/P left TKA 08/11/2019   Genu varum of both lower extremities 06/13/2019   Vitamin B12 deficiency 06/13/2019   Obesity (BMI 30-39.9) 12/27/2018   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   Tinnitus of right ear 12/27/2018   Gait disturbance 12/27/2018   Normochromic anemia 12/27/2018   Primary osteoarthritis of both knees 71/24/5809   Diastolic heart failure ([place]) 09/11/2017   DDD (degenerative disc disease), cervical 09/04/2017   Dyspnea, chronic DOE 02/27/2012   Herniated disc    Hyperlipidemia 98/33/8250   Diastolic dysfunction, left ventricle 02/23/2009   Essential hypertension 10/18/2007   OSA on CPAP 10/11/2007     Current Outpatient Medications on File Prior to Visit  Medication Sig Dispense Refill   acetaminophen (TYLENOL)  325 MG tablet Take 325-650 mg by mouth every 6 (six) hours as needed for headache.     apixaban (ELIQUIS) 5 MG TABS tablet Take 1 tablet (5 mg total) by mouth 2 (two) times daily. 180 tablet 1   atorvastatin (LIPITOR) 10 MG tablet Take 1 tablet (10 mg total) by mouth daily. 90 tablet 1   cyanocobalamin 1000 MCG tablet Take 3,000 mcg by mouth daily.     docusate sodium (COLACE) 100 MG capsule Take 1 capsule (100 mg total) by mouth 2 (two) times daily as needed for mild constipation. (Patient  taking differently: Take 100 mg by mouth daily.) 10 capsule 0   furosemide (LASIX) 20 MG tablet Take 1 tablet (20 mg total) by mouth daily as needed for edema (for swelling in legs/ankle). 30 tablet 3   metoprolol succinate (TOPROL-XL) 25 MG 24 hr tablet Take 1 tablet (25 mg total) by mouth daily. Take with or immediately following a meal. 90 tablet 1   No current facility-administered medications on file prior to visit.    Allergies  Allergen Reactions   Codeine Nausea And Vomiting   Oxycodone Other (See Comments)    Upset GI    Social History   Socioeconomic History   Marital status: Divorced    Spouse name: Not on file   Number of children: 0   Years of education: some college   Highest education level: Not on file  Occupational History   Occupation: Retired  Tobacco Use   Smoking status: Never   Smokeless tobacco: Never  Vaping Use   Vaping Use: Never used  Substance and Sexual Activity   Alcohol use: No   Drug use: No   Sexual activity: Not on file  Other Topics Concern   Not on file  Social History Narrative   Denies caffeine use    Social Determinants of Health   Financial Resource Strain: Not on file  Food Insecurity: Not on file  Transportation Needs: Not on file  Physical Activity: Not on file  Stress: Not on file  Social Connections: Not on file  Intimate Partner Violence: Not on file    Family History  Problem Relation Age of Onset   Hypertension Mother    Heart failure Mother    Alzheimer's disease Mother    Skin cancer Mother    Heart disease Father    Alzheimer's disease Maternal Grandmother    Alzheimer's disease Maternal Grandfather     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 at [facility]  12/27/2019   accoustic neuroma   CARDIAC CATHETERIZATION  02/20/2009   normal coronary arteries   CYSTOSCOPY WITH STENT PLACEMENT Left 06/05/2021   Procedure: CYSTOSCOPY WITH STENT PLACEMENT;  Surgeon: Hollice Espy, MD;  Location: [facility];  Service: Urology;  Laterality: Left;   CYSTOSCOPY/URETEROSCOPY/HOLMIUM LASER/STENT PLACEMENT Left 07/01/2021   Procedure: CYSTOSCOPY/URETEROSCOPY/HOLMIUM LASER/STENT EXCHANGE;  Surgeon: Hollice Espy, MD;  Location: [facility];  Service: Urology;  Laterality: Left;   EYE SURGERY     bilateral cataract with lens implant   INCISION AND DRAINAGE PERIRECTAL ABSCESS N/A 01/02/2018   Procedure: IRRIGATION AND DEBRIDEMENT PERIRECTAL ABSCESS;  Surgeon: Coralie Keens, MD;  Location: [place];  Service: General;  Laterality: N/A;   IRRIGATION AND DEBRIDEMENT BUTTOCKS N/A 01/05/2018   Procedure: DEBRIDEMENT BUTTOCKS ABSCESS;  Surgeon: Georganna Skeans, MD;  Location: [place];  Service: General;  Laterality: N/A;   KNEE ARTHROSCOPY  2010   Lt   LEFT  HEART CATH AND CORONARY ANGIOGRAPHY N/A 06/10/2021   Procedure: LEFT HEART CATH AND CORONARY ANGIOGRAPHY;  Surgeon: Wellington Hampshire, MD;  Location: [place] [facility];  Service: Cardiovascular;  Laterality: N/A;   NM MYOCAR PERF WALL MOTION  09/28/2007   small area of reversibility in the anterolateral wall at the apex concerning for ischemia   SHOULDER ARTHROSCOPY  10/2010   Rt   TOTAL KNEE ARTHROPLASTY Left 08/11/2019   Procedure: TOTAL KNEE ARTHROPLASTY;  Surgeon: Paralee Cancel, MD;  Location: [facility];  Service: Orthopedics;  Laterality: Left;  70 mins   TOTAL KNEE ARTHROPLASTY Right 09/13/2019   Procedure: TOTAL KNEE ARTHROPLASTY;  Surgeon: Paralee Cancel, MD;  Location: [facility];  Service: Orthopedics;  Laterality: Right;  70 mins   WOUND DEBRIDEMENT N/A 01/04/2018   Procedure: IRRIGATION AND DEBRIDEMENT OF BUTTOCKS AND REMOVAL OF TICK FROM RIGHT TESTICLE;  Surgeon: Georganna Skeans, MD;  Location: [place];  Service: General;  Laterality: N/A;    ROS: Review of Systems Negative except as stated above  PHYSICAL EXAM: BP (!) 130/100   Pulse 83   Wt 257 lb 3.2 oz (116.7 kg)   SpO2 95%   BMI 33.02 kg/m   Wt Readings  from Last 3 Encounters:  05/30/22 257 lb 3.2 oz (116.7 kg)  04/25/22 253 lb (114.8 kg)  03/24/22 242 lb 3.2 oz (109.9 kg)    Physical Exam  General appearance - alert, well appearing, and in no distress Mental status - normal mood, behavior, speech, dress, motor activity, and thought processes Neck - supple, no significant adenopathy Chest - clear to auscultation, no wheezes, rales or rhonchi, symmetric air entry Heart -heart rate sounds regular. Extremities -nonpitting edema of both lower legs. MSK/neuro: He has somewhat of a stooped posture and walks with his trunk leaning forward.  He has a waddling gait.  Low foot to floor clearance.    Latest Ref Rng & Units 03/24/2022    3:40 PM 06/13/2021    6:48 AM 06/12/2021    3:31 AM  CMP  Glucose 70 - 99 mg/dL 79  76  76   BUN 8 - 27 mg/dL '16  20  23   '$ Creatinine 0.76 - 1.27 mg/dL 1.02  1.08  0.83   Sodium 134 - 144 mmol/L 141  137  138   Potassium 3.5 - 5.2 mmol/L 5.0  4.2  4.0   Chloride 96 - 106 mmol/L 103  102  106   CO2 20 - 29 mmol/L '23  27  26   '$ Calcium 8.6 - 10.2 mg/dL 9.1  8.0  8.1   Total Protein 6.0 - 8.5 g/dL 7.4     Total Bilirubin 0.0 - 1.2 mg/dL 0.6     Alkaline Phos 44 - 121 IU/L 85     AST 0 - 40 IU/L 16     ALT 0 - 44 IU/L 12      Lipid Panel     Component Value Date/Time   CHOL 111 03/24/2022 1540   TRIG 42 03/24/2022 1540   HDL 53 03/24/2022 1540   CHOLHDL 2.1 03/24/2022 1540   LDLCALC 47 03/24/2022 1540    CBC    Component Value Date/Time   WBC 7.5 03/24/2022 1540   WBC 14.2 (H) 06/13/2021 0648   RBC 4.96 03/24/2022 1540   RBC 4.25 06/13/2021 0648   HGB 13.3 03/24/2022 1540   HCT 40.9 03/24/2022 1540   PLT 264 03/24/2022 1540   MCV 83 03/24/2022  1540   MCH 26.8 03/24/2022 1540   MCH 30.4 06/13/2021 0648   MCHC 32.5 03/24/2022 1540   MCHC 33.6 06/13/2021 0648   RDW 15.5 (H) 03/24/2022 1540   LYMPHSABS 1.2 03/24/2022 1540   MONOABS 1.0 06/13/2021 0648   EOSABS 0.0 03/24/2022 1540   BASOSABS  0.0 03/24/2022 1540    ASSESSMENT AND PLAN:  1. Essential hypertension Not at goal.  Continue metoprolol.  Refill Cozaar which he thinks he does not have. - losartan (COZAAR) 25 MG tablet; Take 0.5 tablets (12.5 mg total) by mouth daily.  Dispense: 30 tablet; Refill: 3  2. Gait disturbance Resubmit referral to physical therapy for mobility chair evaluation  3. Chronic systolic congestive heart failure (HCC) Continue metoprolol.  Refill Cozaar.  Refilled furosemide to take as needed - furosemide (LASIX) 20 MG tablet; Take 1 tablet (20 mg total) by mouth daily as needed for edema (for swelling in legs/ankle).  Dispense: 30 tablet; Refill: 3  4. Itching We will give a trial of Claritin - loratadine (CLARITIN) 10 MG tablet; Take 1 tablet (10 mg total) by mouth daily.  Dispense: 30 tablet; Refill: 2  5. Persistent atrial fibrillation (HCC) Continue Eliquis.  CBC in August was normal H/H  6. Pneumococcal vaccination declined Recommended.  Patient declined.  7. Influenza vaccination declined Recommended.  Patient declined.    Patient was given the opportunity to ask questions.  Patient verbalized understanding of the plan and was able to repeat key elements of the plan.   This documentation was completed using Radio producer.  Any transcriptional errors are unintentional.  No orders of the defined types were placed in this encounter.    Requested Prescriptions   Signed Prescriptions Disp Refills   loratadine (CLARITIN) 10 MG tablet 30 tablet 2    Sig: Take 1 tablet (10 mg total) by mouth daily.   losartan (COZAAR) 25 MG tablet 30 tablet 3    Sig: Take 0.5 tablets (12.5 mg total) by mouth daily.    Return in about 3 months (around 08/30/2022) for Give appt with CMA for Medicare Wellness Visit.Karle Plumber, MD, FACP

## 2022-06-04 ENCOUNTER — Ambulatory Visit: Payer: Medicare HMO

## 2022-06-04 NOTE — Therapy (Incomplete)
OUTPATIENT PHYSICAL THERAPY WHEELCHAIR EVALUATION   Patient Name: Brandon Marks MRN: [id number] DOB:[birth year], 66 y.o., male Today's Date: 06/04/2022    Past Medical History:  Diagnosis Date   Arthritis    Coronary artery disease    CSF leak    a. 12/2019 following resection of vestibular schwannoma; b.  12/2020 status post lumbar puncture for ventriculomegaly; e.  01/2021: Status post lumbar drain ([facility]).  Pending VP shunt.   Herniated disc    High cholesterol    History of kidney stones    History of stress test    a. 09/2007 MV: EF 63%, small area of anterolateral and apical reversibility; b.  12/2016 MV: EF 62%.  Fixed small, mild mid anteroseptal and apical defect without reversibility.  Most likely attenuation.  Low risk study.   Hypertension    Morbid obesity ([place])    PAC (premature atrial contraction)    Persistent atrial fibrillation ([place])    a. first noted on 12 lead ECG 10/2019.   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 12/27/2019 s/p resection ([facility]). Post-op course complicated by CSF leak/seizures req repair 01/06/2020 and high volume LP on 01/16/2020.   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 at [facility]  12/27/2019   accoustic neuroma   CARDIAC CATHETERIZATION  02/20/2009   normal coronary arteries   CYSTOSCOPY WITH STENT PLACEMENT Left 06/05/2021   Procedure: CYSTOSCOPY WITH STENT PLACEMENT;  Surgeon: Hollice Espy, MD;  Location: [facility];  Service: Urology;  Laterality: Left;   CYSTOSCOPY/URETEROSCOPY/HOLMIUM LASER/STENT PLACEMENT Left 07/01/2021   Procedure: CYSTOSCOPY/URETEROSCOPY/HOLMIUM LASER/STENT EXCHANGE;  Surgeon: Hollice Espy, MD;  Location: [facility];  Service: Urology;  Laterality: Left;   EYE SURGERY     bilateral cataract with lens implant   INCISION AND DRAINAGE PERIRECTAL ABSCESS N/A 01/02/2018   Procedure: IRRIGATION AND DEBRIDEMENT PERIRECTAL ABSCESS;  Surgeon:  Coralie Keens, MD;  Location: [place];  Service: General;  Laterality: N/A;   IRRIGATION AND DEBRIDEMENT BUTTOCKS N/A 01/05/2018   Procedure: DEBRIDEMENT BUTTOCKS ABSCESS;  Surgeon: Georganna Skeans, MD;  Location: [place];  Service: General;  Laterality: N/A;   KNEE ARTHROSCOPY  2010   Lt   LEFT HEART CATH AND CORONARY ANGIOGRAPHY N/A 06/10/2021   Procedure: LEFT HEART CATH AND CORONARY ANGIOGRAPHY;  Surgeon: Wellington Hampshire, MD;  Location: [place] [facility];  Service: Cardiovascular;  Laterality: N/A;   NM MYOCAR PERF WALL MOTION  09/28/2007   small area of reversibility in the anterolateral wall at the apex concerning for ischemia   SHOULDER ARTHROSCOPY  10/2010   Rt   TOTAL KNEE ARTHROPLASTY Left 08/11/2019   Procedure: TOTAL KNEE ARTHROPLASTY;  Surgeon: Paralee Cancel, MD;  Location: [facility];  Service: Orthopedics;  Laterality: Left;  70 mins   TOTAL KNEE ARTHROPLASTY Right 09/13/2019   Procedure: TOTAL KNEE ARTHROPLASTY;  Surgeon: Paralee Cancel, MD;  Location: [facility];  Service: Orthopedics;  Laterality: Right;  70 mins   WOUND DEBRIDEMENT N/A 01/04/2018   Procedure: IRRIGATION AND DEBRIDEMENT OF BUTTOCKS AND REMOVAL OF TICK FROM RIGHT TESTICLE;  Surgeon: Georganna Skeans, MD;  Location: [place];  Service: General;  Laterality: N/A;   Patient Active Problem List   Diagnosis Date Noted   Calcium oxalate calculus 07/06/2021   NICM (nonischemic cardiomyopathy) ([place])    Acute systolic heart failure (HCC)    Atrial fibrillation with rapid ventricular response ([place]) 06/08/2021  Sepsis secondary to UTI ([place]) 06/08/2021   Severe sepsis with septic shock ([place]) 06/06/2021   Left ureteral calculus 06/06/2021   Cardiogenic shock ([place]) 06/05/2021   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[place]) 02/22/2021   23-polyvalent pneumococcal polysaccharide vaccine declined 02/22/2021   Benign neoplasm of brain, unspecified brain region [facility]) 11/15/2020   Status post excision of acoustic neuroma  02/07/2020   S/P right TKA 09/13/2019   S/P left TKA 08/11/2019   Genu varum of both lower extremities 06/13/2019   Vitamin B12 deficiency 06/13/2019   Obesity (BMI 30-39.9) 12/27/2018   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   Tinnitus of right ear 12/27/2018   Gait disturbance 12/27/2018   Normochromic anemia 12/27/2018   Primary osteoarthritis of both knees 67/89/3810   Diastolic heart failure ([place]) 09/11/2017   DDD (degenerative disc disease), cervical 09/04/2017   Dyspnea, chronic DOE 02/27/2012   Herniated disc    Hyperlipidemia 17/51/0258   Diastolic dysfunction, left ventricle 02/23/2009   Essential hypertension 10/18/2007   OSA on CPAP 10/11/2007    PCP: ***  REFERRING PROVIDER: Ladell Pier, MD   THERAPY DIAG:  No diagnosis found.  Rationale for Evaluation and Treatment Rehabilitation  SUBJECTIVE:                                                                                                                                                                                           SUBJECTIVE STATEMENT: Pt present or wheelchair evaluation. ***  PRECAUTIONS: {Therapy precautions:24002}  WEIGHT BEARING RESTRICTIONS No   OCCUPATION: ***  PLOF: {PLOF:24004}  PATIENT GOALS ***         MEDICAL HISTORY:  Primary diagnosis onset:  Diagnosis  Code:  Diagnosis:    Diagnosis code:       Diagnosis:  R26.9 (ICD-10-CM) - Gait disturbance  Diagnosis  Code:  Diagnosis:   '[]'$ Progressive disease  Relevant future surgeries:     Height:  Weight:  Explain recent changes or trends in weight:      History:  Past Medical History:  Diagnosis Date   Arthritis    Coronary artery disease    CSF leak    a. 12/2019 following resection of vestibular schwannoma; b.  12/2020 status post lumbar puncture for ventriculomegaly; e.  01/2021: Status post lumbar drain ([facility]).  Pending VP shunt.   Herniated disc    High cholesterol    History of kidney stones    History  of stress test    a. 09/2007 MV: EF 63%, small area of anterolateral and apical reversibility; b.  12/2016 MV: EF 62%.  Fixed small, mild mid anteroseptal and apical defect without  reversibility.  Most likely attenuation.  Low risk study.   Hypertension    Morbid obesity ([place])    PAC (premature atrial contraction)    Persistent atrial fibrillation ([place])    a. first noted on 12 lead ECG 10/2019.   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 12/27/2019 s/p resection ([facility]). Post-op course complicated by CSF leak/seizures req repair 01/06/2020 and high volume LP on 01/16/2020.            Cardio Status:  Functional Limitations:   '[]'$ Intact  '[]'$  Impaired      Respiratory Status:  Functional Limitations:   '[]'$ Intact  '[]'$ Impaired   '[]'$ SOB '[]'$ COPD '[]'$ O2 Dependent ______LPM  '[]'$ Ventilator Dependent  Resp equip:                                                     Objective Measure(s):   Orthotics:   '[]'$ Amputee:                                                             '[]'$ Prosthesis:        HOME ENVIRONMENT:  '[]'$ House '[]'$ Condo/town home '[]'$ Apartment '[]'$ Asst living '[]'$ LTCF         '[]'$ Own  '[]'$ Rent   '[]'$ Lives alone '[]'$ Lives with others -                             Hours without assistance:   '[]'$ Home is accessible to patient                                 Storage of wheelchair:  '[]'$ In home   '[]'$ Other Comments:        COMMUNITY :  TRANSPORTATION:  '[]'$ Car '[]'$ Van '[]'$ Public Transportation '[]'$ Adapted w/c Lift '[]'$  Ambulance '[]'$ Other:                     '[]'$ Sits in wheelchair during transport   Where is w/c stored during transport?  '[]'$ [place]  '[]'$  [place]  r   '[]'$ Self-Driver       Drive while in  [address] '[]'$ yes '[]'$ no   Employment and/or school:  Specific requirements pertaining to mobility        Other:  COMMUNICATION:  Verbal Communication  '[]'$ WFL '[]'$ receptive '[]'$ WFL '[]'$ expressive '[]'$ Understandable  '[]'$ Difficult to understand  '[]'$ non-communicative  Primary Language:______________  2nd:_____________  Communication provided by:'[]'$ Patient '[]'$ Family '[]'$ Caregiver '[]'$ Translator   '[]'$ Uses an augmentative communication device     Manufacturer/Model :                                                                MOBILITY/BALANCE:  Sitting Balance  Standing Balance  Transfers  Ambulation   '[]'$ WFL      '[]'$ WFL  '[]'$ Independent  '[]'$  Independent   '[]'$   Uses UE for balance in sitting Comments:  '[]'$ Uses UE/device for stability Comments:  '[]'$  Min assist  '[]'$  Ambulates independently with       device:___________________      '[]'$  Mod assist  '[]'$  Able to ambulate ______ feet        safely/functionally/independently   '[]'$  Min assist  '[]'$  Min assist  '[]'$  Max assist  '[]'$  Non-functional ambulator         History/High risk of falls   '[]'$  Mod assist  '[]'$  Mod assist  '[]'$  Dependent  '[]'$  Unable to ambulate   '[]'$  Max  assist  '[]'$  Max assist  Transfer method:'[]'$ 1 person '[]'$ 2 person '[]'$ sliding board '[]'$ squat pivot '[]'$ stand pivot '[]'$ mechanical patient lift  '[]'$ other:   '[]'$  Unable  '[]'$  Unable    Fall History: # of falls in the past 6 months? *** # of "near" falls in the past 6 months? ***    CURRENT SEATING / MOBILITY:  Current Mobility Device: '[]'$ None '[]'$ Cane/Walker '[]'$ Manual '[]'$ Dependent '[]'$ Dependent w/ Tilt rScooter  '[]'$ Power (type of control):   Manufacturer:  Model:  Serial #:   Size:  Color:  Age:   Purchased by whom:   Current condition of mobility base:    Current seating system:                                                                       Age of seating system:    Describe posture in present seating system:    Is the current mobility meeting medical necessity?:  '[]'$ Yes '[]'$ No Describe:                                     Ability to complete Mobility-Related Activities of Daily Living (MRADL's) with Current Mobility Device:   Move room to room  '[]'$ Independent  '[]'$ Min '[]'$ Mod '[]'$ Max assist  '[]'$ Unable  Comments:   Meal prep  '[]'$ Independent  '[]'$ Min '[]'$ Mod '[]'$ Max assist  '[]'$ Unable    Feeding  '[]'$ Independent  '[]'$ Min '[]'$ Mod '[]'$ Max  assist  '[]'$ Unable    Bathing  '[]'$ Independent  '[]'$ Min '[]'$ Mod '[]'$ Max assist  '[]'$ Unable    Grooming  '[]'$ Independent  '[]'$ Min '[]'$ Mod '[]'$ Max assist  '[]'$ Unable    UE dressing  '[]'$ Independent  '[]'$ Min '[]'$ Mod '[]'$ Max assist  '[]'$ Unable    LE dressing  '[]'$ Independent   '[]'$ Min '[]'$ Mod '[]'$ Max assist  '[]'$ Unable    Toileting  '[]'$ Independent  '[]'$ Min '[]'$ Mod '[]'$ Max assist  '[]'$ Unable    Bowel Mgt: '[]'$  Continent '[]'$  Incontinent '[]'$  Accidents '[]'$  Diapers '[]'$  Colostomy '[]'$  Bowel Program:  Bladder Mgt: '[]'$  Continent '[]'$  Incontinent '[]'$  Accidents '[]'$  Diapers '[]'$  Urinal '[]'$  Intermittent Cath '[]'$  Indwelling Cath '[]'$  Supra-pubic Cath     Current Mobility Equipment Trialed/ Ruled Out:    Does not meet mobility needs due to:    [place] all boxes that indicate inability to use the specific equipment listed     Meets needs for safe  independent functional  ambulation  / mobility    Risk of  Falling or History of Falls    Enviromental limitations      Cognition    Safety concerns with  physical ability    Decreased / limitations endurance  &  strength     Decreased / limitations  motor skills  & coordination    Pain    Pace /  Speed    Cardiac and/or  respiratory condition    Contra - indicated by diagnosis   Cane/Crutches  '[]'$   '[]'$   '[]'$   '[]'$   '[]'$   '[]'$   '[]'$   '[]'$   '[]'$   '[]'$   '[]'$    Walker / Rollator  '[]'$  NA   '[]'$   '[]'$   '[]'$   '[]'$   '[]'$   '[]'$   '[]'$   '[]'$   '[]'$   '[]'$   '[]'$     Manual Wheelchair [id number]:  '[]'$  NA  '[]'$   '[]'$   '[]'$   '[]'$   '[]'$   '[]'$   '[]'$   '[]'$   '[]'$   '[]'$   '[]'$    Manual W/C (K0005) with power assist  '[]'$  NA  '[]'$   '[]'$   '[]'$   '[]'$   '[]'$   '[]'$   '[]'$   '[]'$   '[]'$   '[]'$   '[]'$    Scooter  '[]'$  NA  '[]'$   '[]'$   '[]'$   '[]'$   '[]'$   '[]'$   '[]'$   '[]'$   '[]'$   '[]'$   '[]'$    Power Wheelchair: standard joystick  '[]'$  NA  '[]'$   '[]'$   '[]'$   '[]'$   '[]'$   '[]'$   '[]'$   '[]'$   '[]'$   '[]'$   '[]'$    Power Wheelchair: alternative controls  '[]'$  NA  '[]'$   '[]'$   '[]'$   '[]'$   '[]'$   '[]'$   '[]'$   '[]'$   '[]'$   '[]'$   '[]'$    Summary:  The least costly alternative for independent functional mobility was found to be:    '[]'$  Crutch/Cane  '[]'$  Walker '[]'$  Manual w/c  '[]'$  Manual w/c with power assist   '[]'$  Scooter   '[]'$  Power w/c std  joystick   '[]'$  Power w/c alternative control        '[]'$  Requires dependent care mobility device   Media planner for [employer] skills are adequate for safe mobility equipment operation  '[]'$   Yes '[]'$   No  Patient is willing and motivated to use recommended mobility equipment  '[]'$   Yes '[]'$   No       '[]'$  Patient is unable to safely operate mobility equipment independently and requires dependent care equipment Comments:           SENSATION and SKIN ISSUES:  Sensation '[]'$  Intact  '[]'$  Impaired '[]'$  Absent '[]'$  Hyposensate '[]'$  Hypersensate  '[]'$  Defensiveness  Location(s) of impairment:    Pressure Relief Method(s):  '[]'$  Lean side to side to offload (without risk of falling)  '[]'$   W/C push up (4+ times/hour for 15+ seconds) '[]'$  Stand up (without risk of falling)    '[]'$  Other: (Describe): Effective pressure relief method(s) above can be performed consistently throughout the day: rYes  r No If not, Why?:  Skin Integrity Risk:       '[]'$  Low risk           '[]'$  Moderate risk            '[]'$  High risk  If high risk, explain:   Skin Issues/Skin Integrity  Current skin Issues  '[]'$  Yes '[]'$  No '[]'$  Intact  '[]'$   Red area   '[]'$   Open area  '[]'$  Scar tissue  '[]'$  At risk from prolonged sitting  Where: History of Skin Issues  '[]'$  Yes '[]'$  No Where : When: Stage: Hx of skin flap surgeries  '[]'$  Yes '[]'$  No Where:  When:  Pain: '[]'$  Yes '[]'$  No   Pain Location(s):  Intensity  scale: (0-10) : How does pain interfere with mobility and/or MRADLs? -         MAT EVALUATION:  Neuro-Muscular Status: (Tone, Reflexive, Responses, etc.)     '[]'$   Intact   '[]'$  Spasticity:  '[]'$  Hypotonicity  '[]'$  Fluctuating  '[]'$  Muscle Spasms  '[]'$  Poor Righting Reactions/Poor Equilibrium Reactions  '[]'$  Primal Reflex(s):    Comments:            COMMENTS:    POSTURE:     Comments:  Pelvis Anterior/Posterior:  '[]'$  Neutral   '[]'$  Posterior  '[]'$  Anterior  '[]'$  Fixed - No movement '[]'$  Tendency away from neutral '[]'$  Flexible '[]'$   Self-correction '[]'$  External correction Obliquity (viewed from front)  '[]'$  WFL '[]'$  R Obliquity '[]'$  L Obliquity  '[]'$  Fixed - No movement '[]'$  Tendency away from neutral '[]'$  Flexible '[]'$  Self-correction '[]'$  External correction Rotation  '[]'$  WFL '[]'$  R anterior '[]'$  L anterior  '[]'$  Fixed - No movement '[]'$  Tendency away from neutral '[]'$  Flexible '[]'$  Self-correction '[]'$  External correction Tonal Influence Pelvis:  '[]'$  Normal '[]'$  Flaccid '[]'$  Low tone '[]'$  Spasticity '[]'$  Dystonia '[]'$  Pelvis thrust '[]'$  Other:    Trunk Anterior/Posterior:  '[]'$  WFL '[]'$  Thoracic kyphosis '[]'$  Lumbar lordosis  '[]'$  Fixed - No movement '[]'$  Tendency away from neutral '[]'$  Flexible '[]'$  Self-correction '[]'$  External correction  '[]'$  WFL '[]'$  Convex to left  '[]'$  Convex to right '[]'$  S-curve   '[]'$  C-curve '[]'$  Multiple curves '[]'$  Tendency away from neutral '[]'$  Flexible '[]'$  Self-correction '[]'$  External correction Rotation of shoulders and upper trunk:  '[]'$  Neutral '[]'$  Left-anterior '[]'$  Right- anterior '[]'$  Fixed- no movement '[]'$  Tendency away from neutral '[]'$  Flexible '[]'$  Self correction '[]'$  External correction Tonal influence Trunk:  '[]'$  Normal '[]'$  Flaccid '[]'$  Low tone '[]'$  Spasticity '[]'$  Dystonia '[]'$  Other:   Head & Neck  '[]'$  Functional '[]'$  Flexed    '[]'$  Extended '[]'$  Rotated right  '[]'$  Rotated left '[]'$  Laterally flexed right '[]'$  Laterally flexed left '[]'$  Cervical hyperextension   '[]'$  Good head control '[]'$  Adequate head control '[]'$  Limited head control '[]'$  Absent head control Describe tone/movement of head and neck:      Lower Extremity Measurements: LE ROM:  {AROM/PROM:27142} ROM Right 06/04/2022 Left 06/04/2022  Hip flexion    Hip extension    Hip abduction    Hip adduction    Knee flexion    Knee extension    Ankle dorsiflexion    Ankle plantarflexion     (Blank rows = not tested)  LE MMT:  MMT Right 06/04/2022 Left 06/04/2022  Hip flexion    Hip extension    Hip abduction    Hip adduction    Knee flexion    Knee extension     Ankle dorsiflexion    Ankle plantarflexion     (Blank rows = not tested)  Hip positions:  '[]'$  Neutral   '[]'$  Abducted   '[]'$  Adducted  '[]'$  Subluxed   '[]'$  Dislocated   '[]'$  Fixed   '[]'$  Tendency away from neutral '[]'$  Flexible '[]'$  Self-correction '[]'$  External correction   Hip Windswept:'[]'$  Neutral  '[]'$  Right    '[]'$  Left  '[]'$  Subluxed   '[]'$  Dislocated   '[]'$  Fixed   '[]'$  Tendency away from neutral '[]'$  Flexible '[]'$  Self-correction '[]'$  External correction  LE Tone: '[]'$  Normal '[]'$  Low tone '[]'$  Spasticity '[]'$  Flaccid '[]'$  Dystonia '[]'$  Rocks/Extends at hip '[]'$  Thrust into knee extension '[]'$  Pushes legs downward into footrest  Foot positioning: ROM Concerns: Dorsiflexed: '[]'$   Right   '[]'$  Left Plantar flexed: '[]'$  Right    '[]'$  Left Inversion: '[]'$  Right    '[]'$  Left Eversion: '[]'$  Right    '[]'$  Left  LE Edema: '[]'$  1+ (Barely detectable impression when finger is pressed into skin) '[]'$  2+ (slight indentation. 15 seconds to rebound) '[]'$  3+ (deeper indentation. 30 seconds to rebound) '[]'$  4+ (>30 seconds to rebound)  UE Measurements:  UPPER EXTREMITY ROM:   {AROM/PROM:27142} ROM Right 06/04/2022 Left 06/04/2022  Shoulder flexion    Shoulder abduction    Shoulder adduction    Elbow flexion    Elbow extension    Wrist flexion    Wrist extension    (Blank rows = not tested)  UPPER EXTREMITY MMT:  MMT Right 06/04/2022 Left 06/04/2022  Shoulder flexion    Shoulder abduction    Shoulder adduction    Elbow flexion    Elbow extension    Wrist flexion    Wrist extension    Pinch strength    Grip strength    (Blank rows = not tested)  Shoulder Posture:  Right Tendency towards Left  '[]'$   Functional '[]'$    '[]'$   Elevation '[]'$    '[]'$   Depression '[]'$    '[]'$   Protraction '[]'$    '[]'$   Retraction '[]'$    '[]'$   Internal rotation '[]'$    '[]'$   External rotation '[]'$    '[]'$   Subluxed '[]'$     UE Tone: '[]'$  Normal '[]'$  Flaccid '[]'$  Low tone '[]'$  Spasticity  '[]'$  Dystonia '[]'$  Other:   UE Edema: '[]'$  1+ (Barely detectable impression when finger is pressed  into skin) '[]'$  2+ (slight indentation. 15 seconds to rebound) '[]'$  3+ (deeper indentation. 30 seconds to rebound) '[]'$  4+ (>30 seconds to rebound)  Wrist/Hand: Handedness: '[]'$  Right   '[]'$  Left   '[]'$  NA: Comments:  Right  Left  '[]'$   WNL '[]'$    '[]'$   Limitations '[]'$    '[]'$   Contractures '[]'$    '[]'$   Fisting '[]'$    '[]'$   Tremors '[]'$    '[]'$   Weak grasp '[]'$    '[]'$   Poor dexterity '[]'$    '[]'$   Hand movement non functional '[]'$    '[]'$   Paralysis '[]'$         MOBILITY BASE RECOMMENDATIONS and JUSTIFICATION:  MOBILITY BASE  JUSTIFICATION   Manufacturer:    Model:                              Color:  Seat Width:   Seat Depth    '[]'$  Manual mobility base (continue below)   '[]'$  Scooter/POV  '[]'$  Power mobility base   Number of hours per day spent in above selected mobility base:   Typical daily mobility base use Schedule:    '[]'$  is not a safe, functional ambulator  '[]'$  limitation prevents from completing a MRADL(s) within a reasonable time frame    '[]'$  limitation places at high risk of morbidity or mortality secondary to  the attempts to perform a    MRADL(s)  '[]'$  limitation prevents accomplishing a MRADL(s) entirely  '[]'$  provide independent mobility  '[]'$  equipment is a lifetime medical need  '[]'$  walker or cane inadequate  '[]'$  any type manual wheelchair      inadequate  '[]'$  scooter/POV inadequate      '[]'$  requires dependent mobility          MANUAL MOBILITY      '[]'$  Standard manual wheelchair  K0001  Arm:    '[]'$  both '[]'$  right  '[]'$  left      Foot:   '[]'$  both '[]'$  right   '[]'$  left  '[]'$  self-propels wheelchair  '[]'$  will use on regular basis  '[]'$  chair fits throughout home  '[]'$  willing and motivated to use  '[]'$  propels with assistance     '[]'$  dependent use   '[]'$  Standard hemi-manual wheelchair  K0002      Arm:    '[]'$  both '[]'$  right  '[]'$  left      Foot:   '[]'$  both '[]'$  right   '[]'$  left  '[]'$  lower seat height required to foot propel  '[]'$  short stature  '[]'$  self-propels wheelchair  '[]'$  will use on regular basis  '[]'$  chair fits throughout home   '[]'$  willing and motivated to use   '[]'$  propels with assistance  '[]'$  dependent use   '[]'$  Lightweight manual wheelchair  K0003      Arm:    '[]'$  both '[]'$  right  '[]'$  left      Foot:   '[]'$  both  '[]'$  right  '[]'$  left                   '[]'$  hemi height required  '[]'$  medical condition and weight of  wheelchair affect ability to self      propel standard manual wheelchair in the residence  '[]'$  can and does self-propel (marginal propulsion skills)  '[]'$  daily use _________hours  '[]'$  chair fits throughout home  '[]'$  willing and motivated to use  '[]'$  lower seat height required to foot propel  '[]'$  short stature   '[]'$  High strength lightweight manual  wheelchair (Breezy Ultra 4)  K0004     Arm:    '[]'$  both '[]'$  right  '[]'$  left     Foot:   '[]'$  both '[]'$  right   '[]'$  left                                                                  '[]'$  hemi height required '[]'$  medical condition and weight of wheelchair affect ability to self propel while engaging in frequent MRADL(s) that cannot be performed in a standard or lightweight manual wheelchair  '[]'$  daily use _________hours  '[]'$  chair fits throughout home  '[]'$  willing and motivated to use  '[]'$  prevent repetitive use injuries   '[]'$  lower seat height required to foot propel  '[]'$  short stature    '[]'$  Ultra-lightweight manual wheelchair  K0005     Arm:    '[]'$  both '[]'$  right  '[]'$  left     Foot:   '[]'$  both '[]'$  right  '[]'$  left       '[]'$  hemi height required  '[]'$  heavy duty    Front seat to floor _____ inches      Rear seat to floor _____ inches      Back height _____ inches     Back angle ______ degrees      Front angle _____ degrees  '[]'$   full-time manual wheelchair user  '[]'$  Requires individualized fitting and optimal adjustments for multiple features that include adjustable axle configuration, fully adjustable center of gravity, wheel camber, seat and back angle, angle of seat slope, which cannot be accommodated by a K0001  through K0004 manual wheelchair  '[]'$  prevent repetitive use injuries   '[]'$  daily use_________hours   '[]'$  user has high activity patterns that frequently require  them  to go out into the community for the purpose of independently accomplishing high level MRADL activities. Examples of these might include a combination of; shopping, work, school, Science writer, childcare, independently loading and unloading from a vehicle etc.  '[]'$  lower seat height required to foot propel  '[]'$  short stature  '[]'$  heavy duty -  weight over 250lbs   '[]'$  Current chair is a K0005   manufacture:___________________  model:_________________  serial#____________________  age:_________    '[]'$  First time [id number] user (complete trial)  K0004 time and # of strokes to propel 30 feet: ________seconds _________strokes  A2130 time and # of strokes to propel 30 feet: ________seconds _________strokes  What was the result of the trial between the K0004 and K0005 manual wheelchair? ___    What features of the K0005 w/c are needed as compared to the K0004 base? Why?___    '[]'$  adjustable seat and back angle changes the angle of seat slope of the frame to attain a gravity assisted position for efficient propulsion and proper weight distribution along the frame     '[]'$  the front of the wheelchair will be configured higher than the back of the chair to allow gravity to assist the user with postural stability  '[]'$  the center of the wheel will be positioned for stability, safety and efficient propulsion  '[]'$  adjustable axle allows for vertical, horizontal, camber and overall width changes  throughout the wheels for adjustment of the client's exact needs and abilities.   '[]'$  adjustable axle increases the stability and function of the chair allowing for adjustment of the center of gravity.   '[]'$  accommodates the client's anatomical position in the chair maximizing independence in mobility and maneuverability in all environments.   '[]'$  create a minimal fixed tilt-in space to assist in positioning.   '[]'$  Describe users  full-time manual wheelchair activity patterns:___    '[]'$  Power assist Comments:  '[]'$  prevent repetitive use injuries  '[]'$  repetitive strain injury present in    shoulder girdle    '[]'$  shoulder pain is (> or =) to 7/10     during manual propulsion       Current Pain _____/10  '[]'$  requires conservation of energy to participate in MRADL(s) runable to propel up ramps or curbs using manual wheelchair  '[]'$  been K0005 user greater than one year  '[]'$  user unwilling to use power      wheelchair (reason): '[]'$  less expensive option to power   wheelchair   '[]'$  rim activated power assist -      decreased strength   '[]'$  Heavy duty manual wheelchair       K0006     Arm:    '[]'$  both '[]'$  right  '[]'$  left     Foot:   '[]'$  both '[]'$  right  '[]'$  left     '[]'$  hemi height required    '[]'$  Dependent base  '[]'$  user exceeds 250lbs  '[]'$  non-functional ambulator    '[]'$  extreme spasticity  '[]'$  over active movement   '[]'$  broken frame/hx of repeated     repairs  '[]'$  able to self-propel in residence       '[]'$  lower seat to floor height required  '[]'$  unable to self-propel in residence   '[]'$  Extra heavy duty manual wheelchair  K0007     Arm:    '[]'$   both '[]'$  right  '[]'$  left     Foot:   '[]'$  both '[]'$  right  '[]'$  left     '[]'$  hemi height required  '[]'$  Dependent base  '[]'$  user exceeds 300lbs  '[]'$  non-functional ambulator    '[]'$  able to self-propel in residence   '[]'$  lower seat to floor height required  '[]'$  unable to self-propel in residence     '[]'$  Manual wheelchair with tilt [id number]      (Manual "Tilt-n-Space")  '[]'$  patient is dependent for transfers  '[]'$  patient requires frequent       positioning for pressure relief   '[]'$  patient requires frequent      positioning for poor/absent trunk control        '[]'$  Stroller Base  '[]'$  infant/child   '[]'$  unable to propel manual      wheelchair  '[]'$  allows for growth  '[]'$  non-functional ambulator  '[]'$  non-functional UE  '[]'$  independent mobility is not a goal at this time    Mabie handles  '[]'$  extended   rangle adjustable   '[]'$  standard  '[]'$  caregiver access  '[]'$  caregiver assist    '[]'$  allows "hooking" to enable      increased ability to perform ADLs or maintain balance   '[]'$  Angle Adjustable Back  '[]'$  postural control  '[]'$  control of tone/spasticity  '[]'$  accommodation of range of motion  '[]'$  UE functional control  '[]'$  accommodation for seating system    Rear wheel placement  '[]'$  std/fixed rfully adjustableramputee   '[]'$  camber ________degree  '[]'$  removable rear wheel  '[]'$  non-removable rear wheel  Wheel size _______  Wheel style_______________________  '[]'$  improved UE access to wheels  '[]'$  increase propulsion ability  '[]'$  improved stability  '[]'$  changing angle in space for      improvement of postural stability  '[]'$  remove for transport    '[]'$  allow for seating system to fit on      base  '[]'$  amputee placement  '[]'$  1-arm drive access   r R  r L  '[]'$  enable propulsion of manual       wheelchair with one arm    '[]'$  amputee placement   Wheel rims/ Hand rims  '[]'$  Standard    '[]'$  Specialized-____ '[]'$  provide ability to propel manual   '[]'$  increase self-propulsion with hand wheelchair weakness/decreased grasp     '[]'$  Spoke protector/guard   '[]'$  prevent hands from getting caught in spokes   Tires:  '[]'$  pneumatic  '[]'$  flat free inserts  '[]'$  solid  Style:  '[]'$  decrease roll resistance              '[]'$  prevent frequent flats  '[]'$  increase shock absorbency  '[]'$  decrease maintenance   '[]'$  decrease pain from road shock    '[]'$  decrease spasms from road shock    Wheel Locks:    '[]'$  push '[]'$  pull '[]'$  scissor  '[]'$  lock wheels for transfers  '[]'$  lock wheels from rolling   Brake/wheel lock extension:  '[]'$  R  '[]'$  L  '[]'$  allow user to operate wheel locks due to decreased reach or strength   Caster housing:  Caster size:                      Style:                                          '[]'$   suspension fork  '[]'$  maneuverability   '[]'$  stability of wheelchair   '[]'$  durability  '[]'$  maintenance  '[]'$  angle adjustment for posture  '[]'$  allow  for feet to come under        wheelchair base  '[]'$  allows change in seat to floor      height   '[]'$  increase shock absorbency  '[]'$  decrease pain from road shock  '[]'$  decrease spasms from road    shock   '[]'$  Side guards  '[]'$  prevent clothing getting caught in wheel or becoming soiled  rprovide hip and pelvic stability  '[]'$  eliminates contact between body and wheels  '[]'$  limit hand contact with wheels   '[]'$  Anti-tippers      '[]'$  prevent wheelchair from tipping    backward  '[]'$  assist caregiver with curbs     POWER MOBILITY      '[]'$  Scooter/POV    '[]'$  can safely operate   '[]'$  can safely transfer   '[]'$  has adequate trunk stability   '[]'$  cannot functionally propel  manual wheelchair    '[]'$  Power mobility base    '[]'$  non-ambulatory   '[]'$  cannot functionally propel manual wheelchair   '[]'$  cannot functionally and safely      operate scooter/POV  '[]'$  can safely operate power       wheelchair  '[]'$  home is accessible  '[]'$  willing to use power wheelchair     Tilt  '[]'$  Powered tilt on powered chair  '[]'$  Powered tilt on manual chair  '[]'$  Manual tilt on manual chair Comments:  '[]'$  change position for pressure      '[]'$  elief/cannot weight shift   '[]'$  change position against      gravitational force on head and      shoulders   '[]'$  decrease pain  '[]'$  blood pressure management   '[]'$  control autonomic dysreflexia  '[]'$  decrease respiratory distress  '[]'$  management of spasticity  '[]'$  management of low tone  '[]'$  facilitate postural control   '[]'$  rest periods   '[]'$  control edema  '[]'$  increase sitting tolerance   '[]'$  aid with transfers     Recline   '[]'$  Power recline on power chair  '[]'$  Manual recline on manual chair  Comments:    '[]'$  intermittent catheterization  '[]'$  manage spasticity  '[]'$  accommodate femur to back angle  '[]'$  change position for pressure relief/cannot weight shift rhigh risk of pressure sore development  '[]'$  tilt alone does not accomplish     effective pressure relief, maximum pressure relief achieved at -       _______ degrees tilt   _______ degrees recline   '[]'$  difficult to transfer to and from bed '[]'$  rest periods and sleeping in chair  '[]'$  repositioning for transfers  '[]'$  bring to full recline for ADL care  '[]'$  clothing/diaper changes in chair  '[]'$  gravity PEG tube feeding  '[]'$  head positioning  '[]'$  decrease pain  '[]'$  blood pressure management   '[]'$  control autonomic dysreflexia  '[]'$  decrease respiratory distress  '[]'$  user on ventilator     Elevator on mobility base  '[]'$  Power wheelchair  '[]'$  Scooter  '[]'$  increase Indep in transfers   '[]'$  increase Indep in ADLs    '[]'$  bathroom function and safety  '[]'$  kitchen/cooking function and safety  '[]'$  shopping  '[]'$  raise height for communication at standing level  '[]'$  raise height for eye contact which reduces cervical neck strain and pain  '[]'$  drive at raised height for safety and  navigating crowds  '[]'$  Other:   '[]'$  Vertical position system  (anterior tilt)     (Drive locks-out)    '[]'$  Stand       (Drive enabled)  '[]'$  independent weight bearing  '[]'$  decrease joint contractures  '[]'$  decrease/manage spasticity  '[]'$  decrease/manage spasms  '[]'$  pressure distribution away from   scapula, sacrum, coccyx, and ischial tuberosity  '[]'$  increase digestion and elimination   '[]'$  access to counters and cabinets  '[]'$  increase reach  '[]'$  increase interaction with others at eye level, reduces neck strain  '[]'$  increase performance of       MRADL(s)      Power elevating legrest    '[]'$  Center mount (Single) 85-170 degrees       '[]'$  Standard (Pair) 100-170 degrees  '[]'$  position legs at 90 degrees, not available with std power ELR  '[]'$  center mount tucks into chair to decrease turning radius in home, not available with std power ELR  '[]'$  provide change in position for LE  '[]'$  elevate legs during recline    '[]'$  maintain placement of feet on      footplate  '[]'$  decrease edema  '[]'$  improve circulation  '[]'$  actuator needed to elevate legrest  '[]'$  actuator needed to articulate legrest preventing  knees from flexing  '[]'$  Increase ground clearance over      curbs  '[]'$   STD (pair) independently                     elevate legrest   POWER WHEELCHAIR CONTROLS      Controls/input device  '[]'$  Expandable  '[]'$  Non-expandable  '[]'$  Proportional  '[]'$  Right Hand '[]'$  Left Hand  '[]'$  Non-proportional/switches/head-array  '[]'$  Electrical/proximity         '[]'$   Mechanical      Manufacturer:___________________   Type:________________________ '[]'$  provides access for controlling wheelchair  '[]'$  programming for accurate control  '[]'$  progressive disease/changing condition  '[]'$  required for alternative drive      controls       '[]'$  lacks motor control to operate  proportional drive control  '[]'$  unable to understand proportional controls  '[]'$  limited movement/strength  '[]'$  extraneous movement / tremors / ataxic / spastic       '[]'$  Upgraded electronics controller/harness    '[]'$  Single power (tilt or recline)   '[]'$  Expandable    '[]'$  Non-expandable plus   '[]'$  Multi-power (tilt, recline, power legrest, power seat lift, vertical positioning system, stand)  '[]'$  allows input device to communicate with drive motors  '[]'$  harness provides necessary connections between the controller, input device, and seat functions     '[]'$  needed in order to operate power seat functions through joystick/ input device  '[]'$  required for alternative drive controls     '[]'$  Enhanced display  '[]'$  required to connect all alternative drive controls   '[]'$  required for upgraded joystick      (lite-throw, heavy duty, micro)  '[]'$  Allows user to see in which mode and drive the wheelchair is set; necessary for alternate controls       '[]'$  Upgraded tracking electronics  '[]'$  correct tracking when on uneven surfaces makes switch driving more efficient and less fatiguing  '[]'$  increase safety when driving  '[]'$  increase ability to traverse thresholds    '[]'$  Safety / reset / mode switches     Type:    '[]'$  Used to change modes and stop the wheelchair when driving     '[]'$   Mount for joystick / input device/switches  '[]'$  swing away for access or transfers   '[]'$  attaches joystick / input device / switches to wheelchair   '[]'$  provides for consistent access  '[]'$  midline for optimal placement    '[]'$  Attendant controlled joystick plus     mount  '[]'$  safety  '[]'$  long distance driving  '[]'$  operation of seat functions  '[]'$  compliance with transportation regulations    '[]'$  Battery  '[]'$  required to power (power assist / scooter/ power wc / other):   '[]'$  Power inverter (24V to 12V)  '[]'$  required for ventilator / respiratory equipment / other:     CHAIR OPTIONS MANUAL & POWER      Armrests   '[]'$  adjustable height '[]'$  removable  '[]'$  swing away '[]'$  fixed  '[]'$  flip back  '[]'$  reclining  '[]'$  full length pads '[]'$  desk '[]'$  tube arms '[]'$  gel pads  '[]'$  provide support with elbow at 90    '[]'$  remove/flip back/swing away for  transfers  '[]'$  provide support and positioning of upper body    '[]'$  allow to come closer to table top  '[]'$  remove for access to tables  '[]'$  provide support for w/c tray  '[]'$  change of height/angles for       variable activities   '[]'$  Elbow support / Elbow stop  '[]'$  keep elbow positioned on arm pad  '[]'$  keep arms from falling off arm pad  during tilt and/or recline   Upper Extremity Support  '[]'$  Arm trough  '[]'$   R  '[]'$   L  Style:  '[]'$  swivel mount '[]'$  fixed mount   '[]'$  posterior hand support  '[]'$   tray  '[]'$  full tray  '[]'$  joystick cut out  '[]'$   R  '[]'$   L  Style:  '[]'$  decrease gravitational pull on      shoulders  '[]'$  provide support to increase UE  function  '[]'$  provide hand support in natural    position  '[]'$  position flaccid UE  '[]'$  decrease subluxation    '[]'$  decrease edema       '[]'$  manage spasticity   '[]'$  provide midline positioning  '[]'$  provide work surface  '[]'$  placement for AAC/ Computer/ EADL       Hangers/ Legrests   '[]'$  ______ degree  '[]'$  Elevating '[]'$  articulating  '[]'$  swing away '[]'$  fixed '[]'$  lift off  '[]'$  heavy duty '[]'$  adjustable knee angle  '[]'$  adjustable calf panel   '[]'$  longer  extension tube              '[]'$  provide LE support  '[]'$  maintain placement of feet on      footplate   '[]'$  accommodate lower leg length  '[]'$  accommodate to hamstring       tightness  '[]'$  enable transfers  '[]'$  provide change in position for LE's  '[]'$  elevate legs during recline    '[]'$  decrease edema  '[]'$  durability      Foot support   '[]'$  footplate '[]'$  R '[]'$  L '[]'$  flip up           '[]'$  Depth adjustable   '[]'$  angle adjustable  '[]'$  foot board/one piece    '[]'$  provide foot support  '[]'$  accommodate to ankle ROM  '[]'$  allow foot to go under wheelchair base  '[]'$  enable transfers     '[]'$  Shoe holders  '[]'$  position foot    '[]'$  decrease / manage spasticity  '[]'$  control position of LE  '[]'$  stability    '[]'$   safety     '[]'$  Ankle strap/heel      loops  '[]'$  support foot on foot support  '[]'$  decrease extraneous movement  '[]'$  provide input to heel   '[]'$  protect foot     '[]'$  Amputee adapter '[]'$  R  '[]'$  L     Style:                  Size:  '[]'$  Provide support for stump/residual extremity    '[]'$  Transportation tie-down  '[]'$  to provide crash tested tie-down brackets    '[]'$  Crutch/cane holder    '[]'$  O2 holder    '[]'$  IV hanger   '[]'$  Ventilator tray/mount    '[]'$  stabilize accessory on wheelchair       Component  Justification     '[]'$  Seat cushion      '[]'$  accommodate impaired sensation  '[]'$  decubitus ulcers present or history  '[]'$  unable to shift weight  '[]'$  increase pressure distribution  '[]'$  prevent pelvic extension  '[]'$  custom required "off-the-shelf"    seat cushion will not accommodate deformity  '[]'$  stabilize/promote pelvis alignment  '[]'$  stabilize/promote femur alignment  '[]'$  accommodate obliquity  '[]'$  accommodate multiple deformity  '[]'$  incontinent/accidents  '[]'$  low maintenance     '[]'$  seat mounts                 '[]'$  fixed '[]'$  removable  '[]'$  attach seat platform/cushion to wheelchair frame    '[]'$  Seat wedge    '[]'$  provide increased aggressiveness of seat shape to decrease sliding  down in the seat  '[]'$  accommodate ROM        '[]'$  Cover  replacement   '[]'$  protect back or seat cushion  '[]'$  incontinent/accidents    '[]'$  Solid seat / insert    '[]'$  support cushion to prevent      hammocking  '[]'$  allows attachment of cushion to mobility base    '[]'$  Lateral pelvic/thigh/hip     support (Guides)     '[]'$  decrease abduction  '[]'$  accommodate pelvis  '[]'$  position upper legs  '[]'$  accommodate spasticity  '[]'$  removable for transfers     '[]'$  Lateral pelvic/thigh      supports mounts  '[]'$  fixed   '[]'$  swing-away   '[]'$  removable  '[]'$  mounts lateral pelvic/thigh supports     '[]'$  mounts lateral pelvic/thigh supports swing-away or removable for transfers    '[]'$  Medial thigh support (Pommel)  '[]'$ decrease adduction  '[]'$ accommodate ROM  '[]'$  remove for transfers   '[]'$  alignment      '[]'$  Medial thigh   '[]'$  fixed      support mounts      '[]'$  swing-away   '[]'$  removable  '[]'$  mounts medial thigh supports   '[]'$  Mounts medial supports swing- away or removable for transfers       Component  Justification   '[]'$  Back       '[]'$  provide posterior trunk support '[]'$  facilitate tone  '[]'$  provide lumbar/sacral support '[]'$  accommodate deformity  '[]'$  support trunk in midline   '[]'$  custom required "off-the-shelf" back support will not accommodate deformity   '[]'$  provide lateral trunk support '[]'$  accommodate or decrease tone            '[]'$  Back mounts  '[]'$  fixed  '[]'$  removable  '[]'$  attach back rest/cushion to wheelchair frame   '[]'$  Lateral trunk      supports  '[]'$  R '[]'$  L  '[]'$  decrease lateral trunk leaning  '[]'$  accommodate asymmetry    '[]'$   contour for increased contact  '[]'$  safety    '[]'$  control of tone    '[]'$  Lateral trunk      supports mounts  '[]'$  fixed  '[]'$  swing-away   '[]'$  removable  '[]'$  mounts lateral trunk supports     '[]'$  Mounts lateral trunk supports swing-away or removable for transfers   '[]'$  Anterior chest      strap, vest     '[]'$  decrease forward movement of shoulder  '[]'$  decrease forward movement of trunk  '[]'$  safety/stability  '[]'$  added abdominal support  '[]'$  trunk alignment  '[]'$  assistance with  shoulder control   '[]'$  decrease shoulder elevation    '[]'$  Headrest      '[]'$  provide posterior head support  '[]'$  provide posterior neck support  '[]'$  provide lateral head support  '[]'$  provide anterior head support  '[]'$  support during tilt and recline  '[]'$  improve feeding     '[]'$  improve respiration  '[]'$  placement of switches  '[]'$  safety    '[]'$  accommodate ROM   '[]'$  accommodate tone  '[]'$  improve visual orientation   '[]'$  Headrest           '[]'$  fixed '[]'$  removable '[]'$  flip down      Mounting hardware   '[]'$  swing-away laterals/switches  '[]'$  mount headrest   '[]'$  mounts headrest flip down or  removable for transfers  '[]'$  mount headrest swing-away laterals   '[]'$  mount switches     '[]'$  Neck Support    '[]'$  decrease neck rotation  '[]'$  decrease forward neck flexion   Pelvic Positioner    '[]'$  std hip belt          '[]'$  padded hip belt  '[]'$  dual pull hip belt  '[]'$  four point hip belt  '[]'$  stabilize tone  '[]'$  decrease falling out of chair  '[]'$  prevent excessive extension  '[]'$  special pull angle to control      rotation  '[]'$  pad for protection over boney   prominence  '[]'$  promote comfort    '[]'$  Essential needs        bag/pouch   '[]'$  medicines '[]'$  special food rorthotics '[]'$  clothing changes  '[]'$  diapers  '[]'$  catheter/hygiene '[]'$  ostomy supplies   The above equipment has a life- long use expectancy.  Growth and changes in medical and/or functional conditions would be the exceptions.   SUMMARY:  Why mobility device was selected; include why a lower level device is not appropriate:   ASSESSMENT:  CLINICAL IMPRESSION: Patient is a *** y.o. *** who was seen today for physical therapy evaluation and treatment for ***.    OBJECTIVE IMPAIRMENTS {opptimpairments:25111}.   ACTIVITY LIMITATIONS {activitylimitations:27494}  PARTICIPATION LIMITATIONS: {participationrestrictions:25113}  PERSONAL FACTORS {Personal factors:25162} are also affecting patient's functional outcome.   REHAB POTENTIAL: {rehabpotential:25112}  CLINICAL DECISION  MAKING: {clinical decision making:25114}  EVALUATION COMPLEXITY: {Evaluation complexity:25115}                                   GOALS: One time visit. No goals established.    PLAN: PT FREQUENCY: one time visit    Kerrie Pleasure, PT 06/04/2022, 1:18 PM    I concur with the above findings and recommendations of the therapist:  Physician name printed:         Physician's signature:      Date:

## 2022-06-12 DIAGNOSIS — H524 Presbyopia: Secondary | ICD-10-CM | POA: Diagnosis not present

## 2022-06-12 DIAGNOSIS — H5203 Hypermetropia, bilateral: Secondary | ICD-10-CM | POA: Diagnosis not present

## 2022-06-27 ENCOUNTER — Ambulatory Visit: Payer: Medicare HMO | Attending: Internal Medicine

## 2022-06-27 DIAGNOSIS — Z1211 Encounter for screening for malignant neoplasm of colon: Secondary | ICD-10-CM

## 2022-06-27 DIAGNOSIS — Z0000 Encounter for general adult medical examination without abnormal findings: Secondary | ICD-10-CM

## 2022-06-27 NOTE — Progress Notes (Signed)
Subjective:   Brandon Marks is a 66 y.o. male who presents for Medicare Annual/Subsequent preventive examination.  Review of Systems     I connected with Brandon Marks on 06/27/2022 at 1:36 pm by telephone and verified that I am speaking with the correct person using two identifiers. I discussed the limitations, risks, security and privacy concerns of performing an evaluation and management service by telephone and the availability of in person appointments. I also discussed with the patient that there may be a patient responsible charge related to this service. The patient expressed understanding and agreed to proceed.   Patient location: Home My Location: [place] on the telephone call: Myself and Patinet           Objective:    Today's Vitals   06/27/22 1338  PainSc: 2    There is no height or weight on file to calculate BMI.     06/27/2022    1:42 PM 06/28/2021   10:55 AM 06/05/2021    1:47 PM 05/06/2021    9:56 AM 04/01/2021    2:12 PM 10/27/2019    4:48 PM 09/13/2019    1:30 PM  Advanced Directives  Does Patient Have a Medical Advance Directive? No No No No No No No  Would patient like information on creating a medical advance directive? Yes (ED - Information included in AVS) No - Patient declined No - Patient declined  No - Patient declined  No - Patient declined    Current Medications (verified) Outpatient Encounter Medications as of 06/27/2022  Medication Sig   acetaminophen (TYLENOL) 325 MG tablet Take 325-650 mg by mouth every 6 (six) hours as needed for headache.   apixaban (ELIQUIS) 5 MG TABS tablet Take 1 tablet (5 mg total) by mouth 2 (two) times daily.   atorvastatin (LIPITOR) 10 MG tablet Take 1 tablet (10 mg total) by mouth daily.   cyanocobalamin 1000 MCG tablet Take 3,000 mcg by mouth daily.   docusate sodium (COLACE) 100 MG capsule Take 1 capsule (100 mg total) by mouth 2 (two) times daily as needed for mild constipation.  (Patient taking differently: Take 100 mg by mouth daily.)   furosemide (LASIX) 20 MG tablet Take 1 tablet (20 mg total) by mouth daily as needed for edema (for swelling in legs/ankle).   loratadine (CLARITIN) 10 MG tablet Take 1 tablet (10 mg total) by mouth daily.   losartan (COZAAR) 25 MG tablet Take 0.5 tablets (12.5 mg total) by mouth daily.   metoprolol succinate (TOPROL-XL) 25 MG 24 hr tablet Take 1 tablet (25 mg total) by mouth daily. Take with or immediately following a meal.   No facility-administered encounter medications on file as of 06/27/2022.    Allergies (verified) Codeine and Oxycodone   History: Past Medical History:  Diagnosis Date   Arthritis    Coronary artery disease    CSF leak    a. 12/2019 following resection of vestibular schwannoma; b.  12/2020 status post lumbar puncture for ventriculomegaly; e.  01/2021: Status post lumbar drain ([facility]).  Pending VP shunt.   Herniated disc    High cholesterol    History of kidney stones    History of stress test    a. 09/2007 MV: EF 63%, small area of anterolateral and apical reversibility; b.  12/2016 MV: EF 62%.  Fixed small, mild mid anteroseptal and apical defect without reversibility.  Most likely attenuation.  Low risk study.   Hypertension  Morbid obesity ([place])    PAC (premature atrial contraction)    Persistent atrial fibrillation ([place])    a. first noted on 12 lead ECG 10/2019.   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 12/27/2019 s/p resection ([facility]). Post-op course complicated by CSF leak/seizures req repair 01/06/2020 and high volume LP on 01/16/2020.   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 [facility]  12/27/2019   accoustic neuroma   CARDIAC CATHETERIZATION  02/20/2009   normal coronary arteries   CYSTOSCOPY WITH STENT PLACEMENT Left 06/05/2021   Procedure: CYSTOSCOPY WITH STENT PLACEMENT;  Surgeon: Hollice Espy, MD;  Location: [facility]  ORS;  Service: Urology;  Laterality: Left;   CYSTOSCOPY/URETEROSCOPY/HOLMIUM LASER/STENT PLACEMENT Left 07/01/2021   Procedure: CYSTOSCOPY/URETEROSCOPY/HOLMIUM LASER/STENT EXCHANGE;  Surgeon: Hollice Espy, MD;  Location: [facility] ORS;  Service: Urology;  Laterality: Left;   EYE SURGERY     bilateral cataract with lens implant   INCISION AND DRAINAGE PERIRECTAL ABSCESS N/A 01/02/2018   Procedure: IRRIGATION AND DEBRIDEMENT PERIRECTAL ABSCESS;  Surgeon: Coralie Keens, MD;  Location: [place];  Service: General;  Laterality: N/A;   IRRIGATION AND DEBRIDEMENT BUTTOCKS N/A 01/05/2018   Procedure: DEBRIDEMENT BUTTOCKS ABSCESS;  Surgeon: Georganna Skeans, MD;  Location: [place];  Service: General;  Laterality: N/A;   KNEE ARTHROSCOPY  2010   Lt   LEFT HEART CATH AND CORONARY ANGIOGRAPHY N/A 06/10/2021   Procedure: LEFT HEART CATH AND CORONARY ANGIOGRAPHY;  Surgeon: Wellington Hampshire, MD;  Location: [place] [facility];  Service: Cardiovascular;  Laterality: N/A;   NM MYOCAR PERF WALL MOTION  09/28/2007   small area of reversibility in the anterolateral wall at the apex concerning for ischemia   SHOULDER ARTHROSCOPY  10/2010   Rt   TOTAL KNEE ARTHROPLASTY Left 08/11/2019   Procedure: TOTAL KNEE ARTHROPLASTY;  Surgeon: Paralee Cancel, MD;  Location: [facility] ORS;  Service: Orthopedics;  Laterality: Left;  70 mins   TOTAL KNEE ARTHROPLASTY Right 09/13/2019   Procedure: TOTAL KNEE ARTHROPLASTY;  Surgeon: Paralee Cancel, MD;  Location: [facility] ORS;  Service: Orthopedics;  Laterality: Right;  70 mins   WOUND DEBRIDEMENT N/A 01/04/2018   Procedure: IRRIGATION AND DEBRIDEMENT OF BUTTOCKS AND REMOVAL OF TICK FROM RIGHT TESTICLE;  Surgeon: Georganna Skeans, MD;  Location: [place];  Service: General;  Laterality: N/A;   Family History  Problem Relation Age of Onset   Hypertension Mother    Heart failure Mother    Alzheimer's disease Mother    Skin cancer Mother    Heart disease Father    Alzheimer's disease Maternal  Grandmother    Alzheimer's disease Maternal Grandfather    Social History   Socioeconomic History   Marital status: Divorced    Spouse name: Not on file   Number of children: 0   Years of education: some college   Highest education level: Not on file  Occupational History   Occupation: Retired  Tobacco Use   Smoking status: Never   Smokeless tobacco: Never  Vaping Use   Vaping Use: Never used  Substance and Sexual Activity   Alcohol use: No   Drug use: No   Sexual activity: Not on file  Other Topics Concern   Not on file  Social History Narrative   Denies caffeine use    Social Determinants of Health   Financial Resource Strain: Low Risk  (06/27/2022)   Overall Financial Resource Strain ([place])  Difficulty of Paying Living Expenses: Not hard at all  Food Insecurity: Food Insecurity Present (06/27/2022)   Hunger Vital Sign    Worried About Running Out of Food in the Last Year: Often true    Ran Out of Food in the Last Year: Often true  Transportation Needs: No Transportation Needs (06/27/2022)   PRAPARE - Hydrologist (Medical): No    Lack of Transportation (Non-Medical): No  Physical Activity: Insufficiently Active (06/27/2022)   Exercise Vital Sign    Days of Exercise per Week: 5 days    Minutes of Exercise per Session: 10 min  Stress: No Stress Concern Present (06/27/2022)   [place]    Feeling of Stress : Not at all  Social Connections: Socially Isolated (06/27/2022)   Social Connection and Isolation Panel [NHANES]    Frequency of Communication with Friends and Family: More than three times a week    Frequency of Social Gatherings with Friends and Family: More than three times a week    Attends Religious Services: Never    Marine scientist or Organizations: No    Attends Music therapist: Never    Marital Status: Divorced    Tobacco  Counseling Counseling given: Not Answered   Clinical Intake:  Pre-visit preparation completed: Yes  Pain : 0-10 Pain Score: 2      Nutritional Risks: None Diabetes: No  How often do you need to have someone help you when you read instructions, pamphlets, or other written materials from your doctor or pharmacy?: 1 - Never  Diabetic?No  Interpreter Needed?: No      Activities of Daily Living    06/27/2022    1:51 PM 06/28/2021   10:56 AM  In your present state of health, do you have any difficulty performing the following activities:  Hearing? 1   Vision? 1   Difficulty concentrating or making decisions? 0   Walking or climbing stairs? 1   Dressing or bathing? 0   Doing errands, shopping? 1 0  Preparing Food and eating ? Y   Using the Toilet? N   In the past six months, have you accidently leaked urine? N   Do you have problems with loss of bowel control? N   Managing your Medications? N   Managing your Finances? N   Housekeeping or managing your Housekeeping? Y     Patient Care Team: Ladell Pier, MD as PCP - General (Internal Medicine) Croitoru, Dani Gobble, MD as PCP - Cardiology (Cardiology)  Indicate any recent Medical Services you may have received from other than [facility] providers in the past year (date may be approximate).     Assessment:   This is a routine wellness examination for Brandon Marks.  Hearing/Vision screen No results found.  Dietary issues and exercise activities discussed:     Goals Addressed   None    Depression Screen    06/27/2022    1:42 PM 05/30/2022    3:29 PM 03/24/2022    2:25 PM 02/22/2021    9:23 AM 11/15/2020   11:22 AM 02/07/2020    3:50 PM 06/13/2019    8:56 AM  PHQ 2/9 Scores  PHQ - 2 Score 0 1 0 0 0 0 0  PHQ- 9 Score   2  0      Fall Risk    06/27/2022    1:42 PM 05/30/2022    3:29 PM 03/24/2022  2:24 PM 11/15/2020   11:19 AM 06/13/2019    8:56 AM  Fall Risk   Falls in the past year? 1 0 '1 1 1  '$ Number falls in  past yr: 1 0 0 0 0  Injury with Fall? 0 0 1 0 0  Risk for fall due to : No Fall Risks No Fall Risks Impaired balance/gait    Follow up   Falls evaluation completed  Education provided    [place]:  Any stairs in or around the home? Yes  If so, are there any without handrails? Yes  Home free of loose throw rugs in walkways, pet beds, electrical cords, etc? No  Adequate lighting in your home to reduce risk of falls? Yes   ASSISTIVE DEVICES UTILIZED TO PREVENT FALLS:  Life alert? No  Use of a cane, walker or w/c? Yes  Grab bars in the bathroom? Yes  Shower chair or bench in shower? Yes  Elevated toilet seat or a handicapped toilet? Yes   TIMED UP AND GO:  Was the test performed? No .  Length of time to ambulate 10 feet: N/A sec.   Gait slow and steady with assistive device  Cognitive Function:    06/27/2022    1:42 PM  MMSE - Mini Mental State Exam  Not completed: Unable to complete        06/27/2022    1:45 PM  6CIT Screen  What Year? 0 points  What month? 0 points  What time? 0 points  Count back from 20 0 points  Months in reverse 0 points  Repeat phrase 0 points  Total Score 0 points    Immunizations Immunization History  Administered Date(s) Administered   Pneumococcal Conjugate-13 06/13/2019   Tdap 06/13/2019    TDAP status: Due, Education has been provided regarding the importance of this vaccine. Advised may receive this vaccine at local pharmacy or Health Dept. Aware to provide a copy of the vaccination record if obtained from local pharmacy or Health Dept. Verbalized acceptance and understanding. DECLINED  Flu Vaccine status: Declined, Education has been provided regarding the importance of this vaccine but patient still declined. Advised may receive this vaccine at local pharmacy or Health Dept. Aware to provide a copy of the vaccination record if obtained from local pharmacy or Health Dept. Verbalized acceptance and  understanding.  Pneumococcal vaccine status: Declined,  Education has been provided regarding the importance of this vaccine but patient still declined. Advised may receive this vaccine at local pharmacy or Health Dept. Aware to provide a copy of the vaccination record if obtained from local pharmacy or Health Dept. Verbalized acceptance and understanding.   Covid-19 vaccine status: Declined, Education has been provided regarding the importance of this vaccine but patient still declined. Advised may receive this vaccine at local pharmacy or Health Dept.or vaccine clinic. Aware to provide a copy of the vaccination record if obtained from local pharmacy or Health Dept. Verbalized acceptance and understanding.  Qualifies for Shingles Vaccine? Yes   Zostavax completed No   Shingrix Completed?: No.    Education has been provided regarding the importance of this vaccine. Patient has been advised to call insurance company to determine out of pocket expense if they have not yet received this vaccine. Advised may also receive vaccine at local pharmacy or Health Dept. Verbalized acceptance and understanding.  Screening Tests Health Maintenance  Topic Date Due   Medicare Annual Wellness (AWV)  Never done  COVID-19 Vaccine (1) Never done   Hepatitis C Screening  Never done   COLONOSCOPY (Pts 45-59yr Insurance coverage will need to be confirmed)  Never done   Zoster Vaccines- Shingrix (1 of 2) 08/30/2022 (Originally 03/12/2004)   INFLUENZA VACCINE  10/26/2022 (Originally 02/25/2022)   Pneumonia Vaccine 68 Years old (2 - PPSV23 or PCV20) 05/31/2023 (Originally 08/08/2019)   DTaP/Tdap/Td (2 - Td or Tdap) 06/12/2029   HPV VACCINES  Aged Out    Health Maintenance  Health Maintenance Due  Topic Date Due   Medicare Annual Wellness (AWV)  Never done   COVID-19 Vaccine (1) Never done   Hepatitis C Screening  Never done   COLONOSCOPY (Pts 45-462yrInsurance coverage will need to be confirmed)  Never done     Colorectal cancer screening: Referral to GI placed 06/27/2022. Pt aware the office will call re: appt.  Lung Cancer Screening: (Low Dose CT Chest recommended if Age 68-80ears, 30 pack-year currently smoking OR have quit w/in 15years.) does not qualify.   Lung Cancer Screening Referral: NO  Additional Screening:  Hepatitis C Screening: does qualify; Completed NO  Vision Screening: Recommended annual ophthalmology exams for early detection of glaucoma and other disorders of the eye. Is the patient up to date with their annual eye exam?  Yes  Who is the provider or what is the name of the office in which the patient attends annual eye exams? [facility] If pt is not established with a provider, would they like to be referred to a provider to establish care? No .   Dental Screening: Recommended annual dental exams for proper oral hygiene  Community Resource Referral / Chronic Care Management: CRR required this visit?  No   CCM required this visit?  No      Plan:     I have personally reviewed and noted the following in the patient's chart:   Medical and social history Use of alcohol, tobacco or illicit drugs  Current medications and supplements including opioid prescriptions. Patient is not currently taking opioid prescriptions. Functional ability and status Nutritional status Physical activity Advanced directives List of other physicians Hospitalizations, surgeries, and ER visits in previous 12 months Vitals Screenings to include cognitive, depression, and falls Referrals and appointments  In addition, I have reviewed and discussed with patient certain preventive protocols, quality metrics, and best practice recommendations. A written personalized care plan for preventive services as well as general preventive health recommendations were provided to patient.     AlGomez CleverlyCMRingling 06/27/2022   Nurse Notes: I spent 30 minutes on this telephone encounter AVS  mailed to patinet

## 2022-06-27 NOTE — Patient Instructions (Addendum)
Mr. Brandon Marks , Thank you for taking time to come for your Medicare Wellness Visit. I appreciate your ongoing commitment to your health goals. Please review the following plan we discussed and let me know if I can assist you in the future.   These are the goals we discussed:  Goals   None     This is a list of the screening recommended for you and due dates:  Health Maintenance  Topic Date Due   Medicare Annual Wellness Visit  Never done   COVID-19 Vaccine (1) Never done   Hepatitis C Screening: USPSTF Recommendation to screen - Ages 58-79 yo.  Never done   Colon Cancer Screening  Never done   Zoster (Shingles) Vaccine (1 of 2) 08/30/2022*   Flu Shot  10/26/2022*   Pneumonia Vaccine (2 - PPSV23 or PCV20) 05/31/2023*   DTaP/Tdap/Td vaccine (2 - Td or Tdap) 06/12/2029   HPV Vaccine  Aged Out  *Topic was postponed. The date shown is not the original due date.   Health Maintenance After Age 1 After age 7, you are at a higher risk for certain long-term diseases and infections as well as injuries from falls. Falls are a major cause of broken bones and head injuries in people who are older than age 6. Getting regular preventive care can help to keep you healthy and well. Preventive care includes getting regular testing and making lifestyle changes as recommended by your health care provider. Talk with your health care provider about: Which screenings and tests you should have. A screening is a test that checks for a disease when you have no symptoms. A diet and exercise plan that is right for you. What should I know about screenings and tests to prevent falls? Screening and testing are the best ways to find a health problem early. Early diagnosis and treatment give you the best chance of managing medical conditions that are common after age 41. Certain conditions and lifestyle choices may make you more likely to have a fall. Your health care provider may recommend: Regular vision checks. Poor  vision and conditions such as cataracts can make you more likely to have a fall. If you wear glasses, make sure to get your prescription updated if your vision changes. Medicine review. Work with your health care provider to regularly review all of the medicines you are taking, including over-the-counter medicines. Ask your health care provider about any side effects that may make you more likely to have a fall. Tell your health care provider if any medicines that you take make you feel dizzy or sleepy. Strength and balance checks. Your health care provider may recommend certain tests to check your strength and balance while standing, walking, or changing positions. Foot health exam. Foot pain and numbness, as well as not wearing proper footwear, can make you more likely to have a fall. Screenings, including: Osteoporosis screening. Osteoporosis is a condition that causes the bones to get weaker and break more easily. Blood pressure screening. Blood pressure changes and medicines to control blood pressure can make you feel dizzy. Depression screening. You may be more likely to have a fall if you have a fear of falling, feel depressed, or feel unable to do activities that you used to do. Alcohol use screening. Using too much alcohol can affect your balance and may make you more likely to have a fall. Follow these instructions at home: Lifestyle Do not drink alcohol if: Your health care provider tells you not to drink.  If you drink alcohol: Limit how much you have to: 0-1 drink a day for women. 0-2 drinks a day for men. Know how much alcohol is in your drink. In the [place] one drink equals one 12 oz bottle of beer (355 mL), one 5 oz glass of wine (148 mL), or one 1 oz glass of hard liquor (44 mL). Do not use any products that contain nicotine or tobacco. These products include cigarettes, chewing tobacco, and vaping devices, such as e-cigarettes. If you need help quitting, ask your health care  provider. Activity  Follow a regular exercise program to stay fit. This will help you maintain your balance. Ask your health care provider what types of exercise are appropriate for you. If you need a cane or walker, use it as recommended by your health care provider. Wear supportive shoes that have nonskid soles. Safety  Remove any tripping hazards, such as rugs, cords, and clutter. Install safety equipment such as grab bars in bathrooms and safety rails on stairs. Keep rooms and walkways well-lit. General instructions Talk with your health care provider about your risks for falling. Tell your health care provider if: You fall. Be sure to tell your health care provider about all falls, even ones that seem minor. You feel dizzy, tiredness (fatigue), or off-balance. Take over-the-counter and prescription medicines only as told by your health care provider. These include supplements. Eat a healthy diet and maintain a healthy weight. A healthy diet includes low-fat dairy products, low-fat (lean) meats, and fiber from whole grains, beans, and lots of fruits and vegetables. Stay current with your vaccines. Schedule regular health, dental, and eye exams. Summary Having a healthy lifestyle and getting preventive care can help to protect your health and wellness after age 35. Screening and testing are the best way to find a health problem early and help you avoid having a fall. Early diagnosis and treatment give you the best chance for managing medical conditions that are more common for people who are older than age 51. Falls are a major cause of broken bones and head injuries in people who are older than age 36. Take precautions to prevent a fall at home. Work with your health care provider to learn what changes you can make to improve your health and wellness and to prevent falls. This information is not intended to replace advice given to you by your health care provider. Make sure you discuss  any questions you have with your health care provider. Document Revised: 12/03/2020 Document Reviewed: 12/03/2020 Elsevier Patient Education  [place].

## 2022-06-28 ENCOUNTER — Ambulatory Visit
Admission: EM | Admit: 2022-06-28 | Discharge: 2022-06-28 | Disposition: A | Discharge status: Home / Self Care | Payer: Medicare HMO | Attending: Internal Medicine | Admitting: Internal Medicine

## 2022-06-28 ENCOUNTER — Encounter: Payer: Self-pay | Admitting: Emergency Medicine

## 2022-06-28 DIAGNOSIS — R21 Rash and other nonspecific skin eruption: Secondary | ICD-10-CM | POA: Diagnosis not present

## 2022-06-28 MED ORDER — FAMOTIDINE 20 MG PO TABS: 20.0000 mg | ORAL_TABLET | Freq: Two times a day (BID) | ORAL | 0 refills | Status: DC

## 2022-06-28 MED ORDER — CETIRIZINE HCL 5 MG PO TABS: 5.0000 mg | ORAL_TABLET | Freq: Every day | ORAL | 0 refills | Status: DC

## 2022-06-28 MED ORDER — TRIAMCINOLONE ACETONIDE 0.1 % EX CREA
1.0000 | TOPICAL_CREAM | Freq: Two times a day (BID) | CUTANEOUS | 0 refills | Status: DC
Start: 2022-06-28 — End: 2023-02-23

## 2022-06-28 NOTE — ED Triage Notes (Signed)
Pt is present today with a concerns for a rash that is located on both arms, hands, and abdomen x3 weeks

## 2022-06-28 NOTE — Discharge Instructions (Signed)
I have prescribed you 3 medications including a cream to help alleviate rash.  Please stop taking Claritin and start taking these medications.  Recommend that you follow-up with your primary care doctor or dermatology if symptoms persist or worsen.  Blood work is also pending.  It will take approximately 24 hours and we will call if there are any abnormalities.

## 2022-06-28 NOTE — ED Provider Notes (Signed)
[facility]    CSN: [id number] Arrival date & time: 06/28/22  1153      History   Chief Complaint Chief Complaint  Patient presents with   Rash    HPI Brandon Marks is a 66 y.o. male.   Patient presents with itchy rash to bilateral arms, abdomen, back that has been present for about 2 weeks.  Patient denies any changes to lotions, soaps, detergents, foods, etc.  Denies any associated fever.  Patient reports that his family member has had similar itchiness.  Denies pain associated with the rash.  Patient has taken Claritin with minimal improvement.   Rash   Past Medical History:  Diagnosis Date   Arthritis    Coronary artery disease    CSF leak    a. 12/2019 following resection of vestibular schwannoma; b.  12/2020 status post lumbar puncture for ventriculomegaly; e.  01/2021: Status post lumbar drain ([facility]).  Pending VP shunt.   Herniated disc    High cholesterol    History of kidney stones    History of stress test    a. 09/2007 MV: EF 63%, small area of anterolateral and apical reversibility; b.  12/2016 MV: EF 62%.  Fixed small, mild mid anteroseptal and apical defect without reversibility.  Most likely attenuation.  Low risk study.   Hypertension    Morbid obesity ([place])    PAC (premature atrial contraction)    Persistent atrial fibrillation ([place])    a. first noted on 12 lead ECG 10/2019.   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 12/27/2019 s/p resection ([facility]). Post-op course complicated by CSF leak/seizures req repair 01/06/2020 and high volume LP on 01/16/2020.    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   23-polyvalent pneumococcal polysaccharide vaccine declined 02/22/2021   Benign neoplasm of brain, unspecified brain region [facility]) 11/15/2020   Status post excision of acoustic neuroma 02/07/2020   S/P right TKA 09/13/2019   S/P left TKA 08/11/2019   Genu varum of both lower extremities 06/13/2019   Vitamin B12 deficiency 06/13/2019   Obesity (BMI 30-39.9) 12/27/2018   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   Tinnitus of right ear 12/27/2018   Gait disturbance 12/27/2018   Normochromic anemia 12/27/2018   Primary osteoarthritis of both knees 09/47/0962   Diastolic heart failure ([place]) 09/11/2017   DDD (degenerative disc disease), cervical 09/04/2017   Dyspnea, chronic DOE 02/27/2012   Herniated disc    Hyperlipidemia 83/66/2947   Diastolic dysfunction, left ventricle 02/23/2009   Essential hypertension 10/18/2007   OSA on CPAP 10/11/2007    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 at [place]  12/27/2019   accoustic neuroma   CARDIAC CATHETERIZATION  02/20/2009   normal coronary arteries   CYSTOSCOPY WITH STENT PLACEMENT Left 06/05/2021   Procedure: CYSTOSCOPY WITH STENT PLACEMENT;  Surgeon: Hollice Espy, MD;  Location: [facility];  Service: Urology;  Laterality: Left;   CYSTOSCOPY/URETEROSCOPY/HOLMIUM LASER/STENT PLACEMENT Left 07/01/2021   Procedure: CYSTOSCOPY/URETEROSCOPY/HOLMIUM LASER/STENT EXCHANGE;  Surgeon: Hollice Espy, MD;  Location: [facility];  Service: Urology;  Laterality: Left;   EYE SURGERY     bilateral cataract with lens implant   INCISION AND DRAINAGE PERIRECTAL ABSCESS N/A 01/02/2018   Procedure: IRRIGATION AND DEBRIDEMENT PERIRECTAL ABSCESS;  Surgeon: Coralie Keens, MD;  Location: [place];  Service: General;  Laterality: N/A;   IRRIGATION AND DEBRIDEMENT BUTTOCKS N/A 01/05/2018   Procedure:  DEBRIDEMENT BUTTOCKS ABSCESS;  Surgeon: Georganna Skeans, MD;  Location: [place];  Service: General;  Laterality: N/A;   KNEE ARTHROSCOPY  2010   Lt   LEFT HEART CATH AND CORONARY ANGIOGRAPHY N/A 06/10/2021   Procedure: LEFT HEART CATH AND CORONARY ANGIOGRAPHY;  Surgeon: Wellington Hampshire, MD;  Location: [facility];  Service: Cardiovascular;  Laterality: N/A;   NM MYOCAR PERF WALL MOTION  09/28/2007   small area of reversibility in the anterolateral wall at the apex concerning for ischemia   SHOULDER ARTHROSCOPY  10/2010   Rt   TOTAL KNEE ARTHROPLASTY Left 08/11/2019   Procedure: TOTAL KNEE ARTHROPLASTY;  Surgeon: Paralee Cancel, MD;  Location: [facility];  Service: Orthopedics;  Laterality: Left;  70 mins   TOTAL KNEE ARTHROPLASTY Right 09/13/2019   Procedure: TOTAL KNEE ARTHROPLASTY;  Surgeon: Paralee Cancel, MD;  Location: [facility];  Service: Orthopedics;  Laterality: Right;  70 mins   WOUND DEBRIDEMENT N/A 01/04/2018   Procedure: IRRIGATION AND DEBRIDEMENT OF BUTTOCKS AND REMOVAL OF TICK FROM RIGHT TESTICLE;  Surgeon: Georganna Skeans, MD;  Location: [place];  Service: General;  Laterality: N/A;       Home Medications    Prior to Admission medications   Medication Sig Start Date End Date Taking? Authorizing Provider  cetirizine (ZYRTEC) 5 MG tablet Take 1 tablet (5 mg total) by mouth daily. 06/28/22  Yes Allon Costlow, Michele Rockers, FNP  famotidine (PEPCID) 20 MG tablet Take 1 tablet (20 mg total) by mouth 2 (two) times daily. 06/28/22  Yes Arrie Zuercher, Hildred Alamin E, FNP  triamcinolone cream (KENALOG) 0.1 % Apply 1 Application topically 2 (two) times daily. 06/28/22  Yes Toren Tucholski, Hildred Alamin E, FNP  acetaminophen (TYLENOL) 325 MG tablet Take 325-650 mg by mouth every 6 (six) hours as needed for headache.    [provider]  apixaban (ELIQUIS) 5 MG TABS tablet Take 1 tablet (5 mg total) by mouth 2 (two) times daily. 03/24/22   Ladell Pier, MD  atorvastatin (LIPITOR) 10 MG tablet Take 1 tablet (10 mg total)  by mouth daily. 03/24/22   Ladell Pier, MD  cyanocobalamin 1000 MCG tablet Take 3,000 mcg by mouth daily.    [provider]  docusate sodium (COLACE) 100 MG capsule Take 1 capsule (100 mg total) by mouth 2 (two) times daily as needed for mild constipation. Patient taking differently: Take 100 mg by mouth daily. 06/13/21   Lavina Hamman, MD  furosemide (LASIX) 20 MG tablet Take 1 tablet (20 mg total) by mouth daily as needed for edema (for swelling in legs/ankle). 05/30/22   Ladell Pier, MD  losartan (COZAAR) 25 MG tablet Take 0.5 tablets (12.5 mg total) by mouth daily. 05/30/22   Ladell Pier, MD  metoprolol succinate (TOPROL-XL) 25 MG 24 hr tablet Take 1 tablet (25 mg total) by mouth daily. Take with or immediately following a meal. 03/24/22   Ladell Pier, MD    Family History Family History  Problem Relation Age of Onset   Hypertension Mother    Heart failure Mother  Alzheimer's disease Mother    Skin cancer Mother    Heart disease Father    Alzheimer's disease Maternal Grandmother    Alzheimer's disease Maternal Grandfather     Social History Social History   Tobacco Use   Smoking status: Never   Smokeless tobacco: Never  Vaping Use   Vaping Use: Never used  Substance Use Topics   Alcohol use: No   Drug use: No     Allergies   Codeine and Oxycodone   Review of Systems Review of Systems Per HPI  Physical Exam Triage Vital Signs ED Triage Vitals  Enc Vitals Group     BP 06/28/22 1305 (!) 146/85     Pulse Rate 06/28/22 1305 92     Resp 06/28/22 1305 18     Temp 06/28/22 1305 97.8 F (36.6 C)     Temp src --      SpO2 06/28/22 1305 98 %     Weight --      Height --      Head Circumference --      Peak Flow --      Pain Score 06/28/22 1304 0     Pain Loc --      Pain Edu? --      Excl. in [place]? --    No data found.  Updated Vital Signs BP (!) 146/85   Pulse 92   Temp 97.8 F (36.6 C)   Resp 18   SpO2 98%    Visual Acuity Right Eye Distance:   Left Eye Distance:   Bilateral Distance:    Right Eye Near:   Left Eye Near:    Bilateral Near:     Physical Exam Constitutional:      General: He is not in acute distress.    Appearance: Normal appearance. He is not toxic-appearing or diaphoretic.  HENT:     Head: Normocephalic and atraumatic.  Eyes:     Extraocular Movements: Extraocular movements intact.     Conjunctiva/sclera: Conjunctivae normal.  Pulmonary:     Effort: Pulmonary effort is normal.  Skin:    Comments: Patient has a flat, erythematous, almost purpuric rash present to distal bilateral wrist and extends slightly to dorsal surface of hand.  No bleeding or purulent discharge noted.  Patient has maculopapular rash spread throughout abdomen and back.  No drainage noted.  Neurological:     General: No focal deficit present.     Mental Status: He is alert and oriented to person, place, and time. Mental status is at baseline.  Psychiatric:        Mood and Affect: Mood normal.        Behavior: Behavior normal.        Thought Content: Thought content normal.        Judgment: Judgment normal.      UC Treatments / Results  Labs (all labs ordered are listed, but only abnormal results are displayed) Labs Reviewed  CBC    EKG   Radiology No results found.  Procedures Procedures (including critical care time)  Medications Ordered in UC Medications - No data to display  Initial Impression / Assessment and Plan / UC Course  I have reviewed the triage vital signs and the nursing notes.  Pertinent labs & imaging results that were available during my care of the patient were reviewed by me and considered in my medical decision making (see chart for details).     Unsure exact etiology of patient's  rash.  There is no concern for bacterial or fungal infection on exam.  Rash to bilateral arms appears purpuric but it could also be from patient scratching as he does take  blood thinners.  Given appearance on arms, will obtain CBC to ensure no other worrisome etiology.  Rash to abdomen appears to be allergic in nature.  Limited options on treatment for this as patient's last EF was 30 to 35% so will avoid oral steroids.  Patient requesting cream so will prescribe triamcinolone cream although patient was advised given how diffuse rash is, this may not be helpful.  Also prescribed patient cetirizine antihistamine and Pepcid as this appears safe with patient's daily medication regimen.  Patient was advised to follow-up with his established dermatologist or PCP if rash persists or worsens.  Patient verbalized understanding and was agreeable with plan. Final Clinical Impressions(s) / UC Diagnoses   Final diagnoses:  Rash and nonspecific skin eruption     Discharge Instructions      I have prescribed you 3 medications including a cream to help alleviate rash.  Please stop taking Claritin and start taking these medications.  Recommend that you follow-up with your primary care doctor or dermatology if symptoms persist or worsen.  Blood work is also pending.  It will take approximately 24 hours and we will call if there are any abnormalities.    ED Prescriptions     Medication Sig Dispense Auth. Provider   cetirizine (ZYRTEC) 5 MG tablet Take 1 tablet (5 mg total) by mouth daily. 30 tablet [place], [place] E, [place]   famotidine (PEPCID) 20 MG tablet Take 1 tablet (20 mg total) by mouth 2 (two) times daily. 30 tablet [place], [place] E, [place]   triamcinolone cream (KENALOG) 0.1 % Apply 1 Application topically 2 (two) times daily. 30 g Teodora Medici, [place]      PDMP not reviewed this encounter.   Teodora Medici, [place] 06/28/22 [phone]

## 2022-06-29 LAB — CBC
Hematocrit: 38.6 % (ref 37.5–51.0)
Hemoglobin: 13 g/dL (ref 13.0–17.7)
MCH: 27.8 pg (ref 26.6–33.0)
MCHC: 33.7 g/dL (ref 31.5–35.7)
MCV: 83 fL (ref 79–97)
Platelets: 221 10*3/uL (ref 150–450)
RBC: 4.67 x10E6/uL (ref 4.14–5.80)
RDW: 13.7 % (ref 11.6–15.4)
WBC: 6.4 10*3/uL (ref 3.4–10.8)

## 2022-07-03 NOTE — Addendum Note (Signed)
Addended by: Gomez Cleverly on: 07/03/2022 10:13 AM   Modules accepted: Level of Service

## 2022-08-11 DIAGNOSIS — C44629 Squamous cell carcinoma of skin of left upper limb, including shoulder: Secondary | ICD-10-CM | POA: Diagnosis not present

## 2022-08-11 DIAGNOSIS — Z85828 Personal history of other malignant neoplasm of skin: Secondary | ICD-10-CM | POA: Diagnosis not present

## 2022-08-11 DIAGNOSIS — L570 Actinic keratosis: Secondary | ICD-10-CM | POA: Diagnosis not present

## 2022-09-09 ENCOUNTER — Telehealth: Payer: Self-pay | Admitting: Internal Medicine

## 2022-09-09 NOTE — Telephone Encounter (Signed)
Pt has been approved by [facility] for a new power chair / the one he has is broken / please advise of orders for a new Power chair asap / the order needs to go to [facility] (phone# [phone]) / pt asked if nurse of Dr. Wynetta Emery can please call them

## 2022-09-10 ENCOUNTER — Telehealth: Payer: Self-pay | Admitting: Emergency Medicine

## 2022-09-10 NOTE — Telephone Encounter (Signed)
I called [facility] [phone] and spoke to [place].  She explained that the patient was a no show for his seating eval on 06/04/2022. She said he was contacted about the appointment prior to 11/8 and then they tried to follow up with him after he no-showed but they were unsuccessful. She said that he has been on the wait list for a seating eval and they have him temporarily scheduled for 09/16/2022 @ 0930.  She said that they have not contacted him yet because they are waiting for the vendor to confirm the schedule.  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  [facility] said that they use [employer] and [employer] and the patient told them he chose [place].  I said I will need to speak with the patient.  The patient was sent a My Chart message with the outpatient clinic contact information on 06/27/2022 but as of today, had not read the message.   I then called the patient and he said he was using [facility], he has not heard of [facility] and doesn't know what they do. He said he never received a call from the  rehab center about the seating eval.  He explained that he is not able to stand up and desperately needs the power chair because the one he has is falling apart. I told him about the seating eval that the rehab center is trying to confirm for 2/20 and explained that they work with [facility] and [facility]. He said he doesn't care who he uses. I explained to him that I cannot choose the power chair provider for him.  He said he just wants the chair as soon as possible and I reminded him again that he needs to choose the provider. He said he is familiar with [facility] because they have provided him power chairs in the past.  I told him that I would contact [facility].   I spoke to [employer] and explained that the seating eval is pending but I was informed that the [facility] uses [employer] and [employer].  Lorriane Shire said that they work with [facility] rehab at [facility] and she did not understand why they would not work with [employer].  Lorriane Shire explained that she is a Dealer and she will contact the [facility] and explain to them that they cannot legally direct a patient to a particular company.  It is his choice to choose what DME provider he wants to work with just as it is his choice to choose his PCP.  She said the patient  called her yesterday and was upset with PCP clinic that this order for the power chair is still pending.  I explained that Dr Wynetta Emery was requesting the seating eval prior to placing the order for the power chair.  She said that Dr Wynetta Emery just needs to send her the order for the PT eval and she will schedule it for him, most likely it will not be scheduled until March.  She said she will call the [place] and the patient and then call me back with his decision on providers. Brandon Marks

## 2022-09-10 NOTE — Telephone Encounter (Signed)
Copied from [place]. Topic: Referral - Request for Referral >> Sep 10, 2022  4:31 PM Leone Payor F wrote: Has patient seen PCP for this complaint? Yes.   Referral for which specialty: n/a  Preferred provider/office: [facility]  Reason for referral: Patient wants to stick with them   Patient has an appointment on 02/21 for a wheelchair evaluation. Patient wants to stick with [facility] for his evaluation and wheelchair. Patient is asking for a referral to continue using them.

## 2022-09-11 ENCOUNTER — Telehealth: Payer: Self-pay | Admitting: Internal Medicine

## 2022-09-11 NOTE — Telephone Encounter (Signed)
Copied from [place]. Topic: Referral - Request for Referral >> Sep 11, 2022  8:51 AM Chapman Fitch wrote: Has patient seen PCP for this complaint? Yes  *If NO, is insurance requiring patient see PCP for this issue before PCP can refer them? Referral for which specialty: GI Preferred provider/office: [facility]  Reason for referral: routine colonoscopy

## 2022-09-11 NOTE — Telephone Encounter (Signed)
Pt called to check on an update with getting the power chair asap

## 2022-09-11 NOTE — Telephone Encounter (Signed)
FYI

## 2022-09-12 NOTE — Telephone Encounter (Signed)
Called & spoke to the patient. Verified name & DOB. Informed that a referral has been sent to [facility] as requested. Patient expressed verbal understanding.

## 2022-09-16 ENCOUNTER — Ambulatory Visit: Payer: Medicare HMO | Attending: Internal Medicine | Admitting: Physical Therapy

## 2022-09-16 DIAGNOSIS — R269 Unspecified abnormalities of gait and mobility: Secondary | ICD-10-CM | POA: Insufficient documentation

## 2022-09-16 DIAGNOSIS — Z982 Presence of cerebrospinal fluid drainage device: Secondary | ICD-10-CM | POA: Insufficient documentation

## 2022-09-16 DIAGNOSIS — R2681 Unsteadiness on feet: Secondary | ICD-10-CM | POA: Diagnosis not present

## 2022-09-16 DIAGNOSIS — M6281 Muscle weakness (generalized): Secondary | ICD-10-CM

## 2022-09-16 DIAGNOSIS — R42 Dizziness and giddiness: Secondary | ICD-10-CM | POA: Insufficient documentation

## 2022-09-16 DIAGNOSIS — R2689 Other abnormalities of gait and mobility: Secondary | ICD-10-CM | POA: Diagnosis not present

## 2022-09-16 NOTE — Therapy (Signed)
OUTPATIENT PHYSICAL THERAPY WHEELCHAIR EVALUATION   Patient Name: Brandon Marks MRN: [id number] DOB:1955-09-13, 67 y.o., male Today's Date: 09/16/2022  END OF SESSION:  PT End of Session - 09/16/22 0938     Visit Number 1    Number of Visits 1    Date for PT Re-Evaluation 09/23/22    Authorization Type [employer] HMO    PT Start Time [phone]    PT Stop Time [id number]   wheelchair eval   PT Time Calculation (min) 60 min    Activity Tolerance Patient limited by fatigue    Behavior During Therapy [facility] for tasks assessed/performed             Past Medical History:  Diagnosis Date   Arthritis    Coronary artery disease    CSF leak    a. 12/2019 following resection of vestibular schwannoma; b.  12/2020 status post lumbar puncture for ventriculomegaly; e.  01/2021: Status post lumbar drain ([facility]).  Pending VP shunt.   Herniated disc    High cholesterol    History of kidney stones    History of stress test    a. 09/2007 MV: EF 63%, small area of anterolateral and apical reversibility; b.  12/2016 MV: EF 62%.  Fixed small, mild mid anteroseptal and apical defect without reversibility.  Most likely attenuation.  Low risk study.   Hypertension    Morbid obesity ([place])    PAC (premature atrial contraction)    Persistent atrial fibrillation ([place])    a. first noted on 12 lead ECG 10/2019.   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 12/27/2019 s/p resection ([facility]). Post-op course complicated by CSF leak/seizures req repair 01/06/2020 and high volume LP on 01/16/2020.   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 at [facility]  12/27/2019   accoustic neuroma   CARDIAC CATHETERIZATION  02/20/2009   normal coronary arteries   CYSTOSCOPY WITH STENT PLACEMENT Left 06/05/2021   Procedure: CYSTOSCOPY WITH STENT PLACEMENT;  Surgeon: Hollice Espy, MD;  Location: [facility];  Service: Urology;  Laterality: Left;    CYSTOSCOPY/URETEROSCOPY/HOLMIUM LASER/STENT PLACEMENT Left 07/01/2021   Procedure: CYSTOSCOPY/URETEROSCOPY/HOLMIUM LASER/STENT EXCHANGE;  Surgeon: Hollice Espy, MD;  Location: [facility];  Service: Urology;  Laterality: Left;   EYE SURGERY     bilateral cataract with lens implant   INCISION AND DRAINAGE PERIRECTAL ABSCESS N/A 01/02/2018   Procedure: IRRIGATION AND DEBRIDEMENT PERIRECTAL ABSCESS;  Surgeon: Coralie Keens, MD;  Location: [place];  Service: General;  Laterality: N/A;   IRRIGATION AND DEBRIDEMENT BUTTOCKS N/A 01/05/2018   Procedure: DEBRIDEMENT BUTTOCKS ABSCESS;  Surgeon: Georganna Skeans, MD;  Location: [place];  Service: General;  Laterality: N/A;   KNEE ARTHROSCOPY  2010   Lt   LEFT HEART CATH AND CORONARY ANGIOGRAPHY N/A 06/10/2021   Procedure: LEFT HEART CATH AND CORONARY ANGIOGRAPHY;  Surgeon: Wellington Hampshire, MD;  Location: [place] CV [facility];  Service: Cardiovascular;  Laterality: N/A;   NM MYOCAR PERF WALL MOTION  09/28/2007   small area of reversibility in the anterolateral wall at the apex concerning for ischemia   SHOULDER ARTHROSCOPY  10/2010   Rt   TOTAL KNEE ARTHROPLASTY Left 08/11/2019   Procedure: TOTAL KNEE ARTHROPLASTY;  Surgeon: Paralee Cancel, MD;  Location: [facility];  Service: Orthopedics;  Laterality: Left;  70 mins   TOTAL KNEE ARTHROPLASTY Right 09/13/2019   Procedure: TOTAL KNEE ARTHROPLASTY;  Surgeon: Paralee Cancel, MD;  Location: [facility];  Service: Orthopedics;  Laterality: Right;  70 mins   WOUND DEBRIDEMENT N/A 01/04/2018   Procedure: IRRIGATION AND DEBRIDEMENT OF BUTTOCKS AND REMOVAL OF TICK FROM RIGHT TESTICLE;  Surgeon: Georganna Skeans, MD;  Location: [place];  Service: General;  Laterality: N/A;   Patient Active Problem List   Diagnosis Date Noted   Calcium oxalate calculus 07/06/2021   NICM (nonischemic cardiomyopathy) ([place])    Acute systolic heart failure (HCC)    Atrial fibrillation with rapid ventricular response ([place]) 06/08/2021   Sepsis  secondary to UTI ([place]) 06/08/2021   Severe sepsis with septic shock ([place]) 06/06/2021   Left ureteral calculus 06/06/2021   Cardiogenic shock ([place]) 06/05/2021   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[place]) 02/22/2021   23-polyvalent pneumococcal polysaccharide vaccine declined 02/22/2021   Benign neoplasm of brain, unspecified brain region [facility]) 11/15/2020   Status post excision of acoustic neuroma 02/07/2020   S/P right TKA 09/13/2019   S/P left TKA 08/11/2019   Genu varum of both lower extremities 06/13/2019   Vitamin B12 deficiency 06/13/2019   Obesity (BMI 30-39.9) 12/27/2018   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   Tinnitus of right ear 12/27/2018   Gait disturbance 12/27/2018   Normochromic anemia 12/27/2018   Primary osteoarthritis of both knees [id number]   Diastolic heart failure ([place]) 09/11/2017   DDD (degenerative disc disease), cervical 09/04/2017   Dyspnea, chronic DOE 02/27/2012   Herniated disc    Hyperlipidemia 0000000   Diastolic dysfunction, left ventricle 02/23/2009   Essential hypertension 10/18/2007   OSA on CPAP 10/11/2007    PCP: Ladell Pier, MD  REFERRING PROVIDER: Ladell Pier, MD  THERAPY DIAG:  Muscle weakness (generalized)  Other abnormalities of gait and mobility  Unsteadiness on feet  Rationale for Evaluation and Treatment Habilitation  SUBJECTIVE:                                                                                                                                                                                           SUBJECTIVE STATEMENT: Pt presents for power wheelchair evaluation. Pt's current mobility device is 75+ years old and no longer usable.  PRECAUTIONS: Fall; 5-6 falls in the last 6 months; needs help getting back up from the floor  WEIGHT BEARING RESTRICTIONS No    OCCUPATION: on disability for several years due to DDD  PLOF:  Needs assistance with gait and Needs assistance with  transfers  PATIENT GOALS: to work on getting setup with a new power wheelchair, received his previous chair about 8 years ago and it is no longer usable  MEDICAL HISTORY:  Primary diagnosis onset: 12/27/2018 Diagnosis  Code: R26.9 Diagnosis: gait disturbance   Diagnosis code:       Diagnosis:   Diagnosis  Code: M50.30 Diagnosis: cervical degenerative disc disease  '[]'$ Progressive disease  Relevant future surgeries: N/A    Height: 6'2" Weight: 262 lbs Explain recent changes or trends in weight:  N/A    History:  Past Medical History:  Diagnosis Date   Arthritis    Coronary artery disease    CSF leak    a. 12/2019 following resection of vestibular schwannoma; b.  12/2020 status post lumbar puncture for ventriculomegaly; e.  01/2021: Status post lumbar drain ([facility]).  Pending VP shunt.   Herniated disc    High cholesterol    History of kidney stones    History of stress test    a. 09/2007 MV: EF 63%, small area of anterolateral and apical reversibility; b.  12/2016 MV: EF 62%.  Fixed small, mild mid anteroseptal and apical defect without reversibility.  Most likely attenuation.  Low risk study.   Hypertension    Morbid obesity ([place])    PAC (premature atrial contraction)    Persistent atrial fibrillation ([place])    a. first noted on 12 lead ECG 10/2019.   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 12/27/2019 s/p resection ([facility]). Post-op course complicated by CSF leak/seizures req repair 01/06/2020 and high volume LP on 01/16/2020.            Cardio Status:  Functional Limitations: fatigues after ambulating 10 ft and needs a standing rest break  '[]'$ Intact  '[x]'$  Impaired      Respiratory Status:  Functional Limitations:   '[x]'$ Intact  '[]'$ Impaired   '[]'$ SOB '[]'$ COPD '[]'$ O2 Dependent ______LPM  '[]'$ Ventilator Dependent  Resp equip:                                                     Objective Measure(s):   Orthotics: N/A  '[]'$ Amputee:                                                              '[]'$ Prosthesis:        HOME ENVIRONMENT:  '[x]'$ House '[]'$ Condo/town home '[]'$ Apartment '[]'$ Asst living '[]'$ LTCF         '[]'$ Own  '[]'$ Rent   '[]'$ Lives alone '[x]'$ Lives with others -        caregiver Whitney 24/7                    Hours without assistance:  N/A  '[x]'$ Home is accessible to patient: has a ramp that is weathered                       Storage of wheelchair:  '[x]'$ In home   '[]'$ Other Comments:        COMMUNITY :  TRANSPORTATION:  '[x]'$ Car '[]'$ Van '[]'$ Public Transportation '[]'$ Adapted w/c Lift '[]'$  Ambulance '[]'$ Other:                     '[]'$ Sits in wheelchair during transport   Where is  w/c stored during transport?  '[x]'$ Tie Downs  '[]'$  EZ Lock  r   '[]'$ Self-Driver       Drive while in  Radio broadcast assistant '[]'$ yes '[]'$ no   Employment and/or school:  Specific requirements pertaining to mobility : N/A, pt on disability       Other:  COMMUNICATION:  Verbal Communication  '[x]'$ WFL '[]'$ receptive '[]'$ WFL '[]'$ expressive '[]'$ Understandable  '[]'$ Difficult to understand  '[]'$ non-communicative  Primary Language:______English______ 2nd:_____________  Communication provided by:'[x]'$ Patient '[]'$ Family '[]'$ Caregiver '[]'$ Translator   '[]'$ Uses an augmentative communication device     Manufacturer/Model :                                                                MOBILITY/BALANCE:  Sitting Balance  Standing Balance  Transfers  Ambulation   '[x]'$ WFL      '[]'$ WFL  '[]'$ Independent  '[]'$  Independent   '[]'$ Uses UE for balance in sitting Comments:  '[x]'$ Uses UE/device for stability Comments: RW '[]'$  Min assist  '[]'$  Ambulates independently with       device:___________________      '[x]'$  Mod assist  '[]'$  Able to ambulate ______ feet        safely/functionally/independently   '[]'$  Min assist  '[x]'$  Min assist  '[]'$  Max assist  '[x]'$  Non-functional ambulator         History/High risk of falls   '[]'$  Mod assist  '[]'$  Mod assist  '[]'$  Dependent  '[]'$  Unable to ambulate   '[]'$  Max  assist  '[]'$  Max assist  Transfer method:'[x]'$ 1 person '[]'$ 2 person '[]'$ sliding board '[]'$ squat pivot '[]'$ stand  pivot '[]'$ mechanical patient lift  '[]'$ other:   '[]'$  Unable  '[]'$  Unable    Fall History: # of falls in the past 6 months? 5-6 # of "near" falls in the past 6 months? 10+    CURRENT SEATING / MOBILITY:  Current Mobility Device: '[]'$ None '[]'$ Cane/Walker '[]'$ Manual '[]'$ Dependent '[]'$ Dependent w/ Tilt rScooter  '[x]'$ Power (type of control): hand control remote joystick with fixed Astronomer: [employer] Serial #:   Size: 22 x 22 inches Color:  Age: 62+ years  Purchased by whom: [employer]  Current condition of mobility base: unusable; batteries do not charge, seating system is in disrepair  Current seating system:                       Captain's Seat                                      Age of seating system:  7+ years  Describe posture in present seating system: WFL   Is the current mobility meeting medical necessity?:  '[]'$ Yes '[x]'$ No Describe: Pt's current device no longer charges and his seat cushioning area is in disrepair.                                    Ability to complete Mobility-Related Activities of Daily Living (MRADL's) with Current Mobility Device:   Move room to room  '[]'$ Independent  '[]'$ Min '[x]'$ Mod '[]'$ Max assist  '[]'$ Unable  Comments:   Meal prep  '[]'$ Independent  '[]'$ Min '[]'$ Mod '[]'$ Max  assist  '[x]'$ Unable    Feeding  '[x]'$ Independent  '[]'$ Min '[]'$ Mod '[]'$ Max assist  '[]'$ Unable    Bathing  '[x]'$ Independent  '[]'$ Min '[]'$ Mod '[]'$ Max assist  '[]'$ Unable    Grooming  '[x]'$ Independent  '[]'$ Min '[]'$ Mod '[]'$ Max assist  '[]'$ Unable    UE dressing  '[x]'$ Independent  '[]'$ Min '[]'$ Mod '[]'$ Max assist  '[]'$ Unable    LE dressing  '[x]'$ Independent   '[]'$ Min '[]'$ Mod '[]'$ Max assist  '[]'$ Unable    Toileting  '[]'$ Independent  '[]'$ Min '[x]'$ Mod '[]'$ Max assist  '[]'$ Unable    Bowel Mgt: '[x]'$  Continent '[]'$  Incontinent '[]'$  Accidents '[]'$  Diapers '[]'$  Colostomy '[]'$  Bowel Program:  Bladder Mgt: '[x]'$  Continent '[]'$  Incontinent '[]'$  Accidents '[]'$  Diapers '[x]'$  Urinal '[]'$  Intermittent Cath '[]'$  Indwelling Cath '[]'$  Supra-pubic Cath     Current  Mobility Equipment Trialed/ Ruled Out:    Does not meet mobility needs due to:    Mark all boxes that indicate inability to use the specific equipment listed     Meets needs for safe  independent functional  ambulation  / mobility    Risk of  Falling or History of Falls    Enviromental limitations      Cognition    Safety concerns with  physical ability    Decreased / limitations endurance  & strength     Decreased / limitations  motor skills  & coordination    Pain    Pace /  Speed    Cardiac and/or  respiratory condition    Contra - indicated by diagnosis   Cane/Crutches  '[]'$   '[x]'$   '[]'$   '[]'$   '[x]'$   '[x]'$   '[x]'$   '[]'$   '[x]'$   '[]'$   '[]'$    Walker / Rollator  '[]'$  NA   '[]'$   '[x]'$   '[]'$   '[]'$   '[x]'$   '[x]'$   '[x]'$   '[]'$   '[x]'$   '[]'$   '[]'$     Manual Wheelchair [id number]:  '[]'$  NA  '[]'$   '[x]'$   '[]'$   '[]'$   '[x]'$   '[x]'$   '[x]'$   '[x]'$   '[x]'$   '[]'$   '[]'$    Manual W/C ([id number]) with power assist  '[]'$  NA  '[]'$   '[x]'$   '[]'$   '[]'$   '[x]'$   '[x]'$   '[x]'$   '[x]'$   '[x]'$   '[]'$   '[]'$    Scooter  '[]'$  NA  '[]'$   '[x]'$   '[]'$   '[]'$   '[x]'$   '[x]'$   '[x]'$   '[x]'$   '[x]'$   '[]'$   '[]'$    Power Wheelchair: standard joystick  '[]'$  NA  '[x]'$   '[]'$   '[]'$   '[]'$   '[]'$   '[]'$   '[]'$   '[]'$   '[]'$   '[]'$   '[]'$    Power Wheelchair: alternative controls  '[x]'$  NA  '[]'$   '[]'$   '[]'$   '[]'$   '[]'$   '[]'$   '[]'$   '[]'$   '[]'$   '[]'$   '[]'$    Summary:  The least costly alternative for independent functional mobility was found to be:    '[]'$  Crutch/Cane  '[]'$  Walker '[]'$  Manual w/c  '[]'$  Manual w/c with power assist   '[]'$  Scooter   '[x]'$  Power w/c std joystick   '[]'$  Power w/c alternative control        '[]'$  Requires dependent care mobility Nurse, learning disability for [employer] skills are adequate for safe mobility equipment operation  '[x]'$   Yes '[]'$   No  Patient is willing and motivated to use recommended mobility equipment  '[x]'$   Yes '[]'$   No       '[]'$  Patient is unable to safely operate mobility equipment independently and requires dependent care equipment Comments:  Although patient's current weight is under 300 lbs due to his height and wider posterior region we feel  that the K0825 would be a better fit for the patient dimensionally vs the K0823.        SENSATION and SKIN ISSUES:  Sensation '[]'$  Intact  '[x]'$  Impaired '[]'$  Absent '[]'$  Hyposensate '[]'$  Hypersensate  '[]'$  Defensiveness  Location(s) of impairment: Decreased light touch sensation and proprioception in BLE.   Pressure Relief Method(s):  '[]'$  Lean side to side to offload (without risk of falling)  '[]'$   W/C push up (4+ times/hour for 15+ seconds) '[x]'$  Stand up (without risk of falling)    '[]'$  Other: (Describe): Effective pressure relief method(s) above can be performed consistently throughout the day: rYes  r No If not, Why?:  Skin Integrity Risk:       '[x]'$  Low risk           '[]'$  Moderate risk            '[]'$  High risk  If high risk, explain:   Skin Issues/Skin Integrity  Current skin Issues  '[]'$  Yes '[x]'$  No '[]'$  Intact  '[]'$   Red area   '[]'$   Open area  '[]'$  Scar tissue  '[]'$  At risk from prolonged sitting  Where: History of Skin Issues  '[]'$  Yes '[x]'$  No Where : When: Stage: Hx of skin flap surgeries  '[]'$  Yes '[x]'$  No Where:  When:  Pain: '[x]'$  Yes '[]'$  No   Pain Location(s): low back, R knee Intensity scale: (0-10) : 3/10 (back), 4/10 (knee) How does pain interfere with mobility and/or MRADLs? -  back pain is limiting due to limited flexibility (has to use a reacher to retrieve items), limited tolerance for standing and activity, unable to lift items etc.        MAT EVALUATION:  Neuro-Muscular Status: (Tone, Reflexive, Responses, etc.)     '[x]'$   Intact   '[]'$  Spasticity:  '[]'$  Hypotonicity  '[]'$  Fluctuating  '[]'$  Muscle Spasms  '[]'$  Poor Righting Reactions/Poor Equilibrium Reactions  '[]'$  Primal Reflex(s):    Comments:            COMMENTS:    POSTURE:     Comments:  Pelvis Anterior/Posterior:  '[]'$  Neutral   '[x]'$  Posterior  '[]'$  Anterior  '[]'$  Fixed - No movement '[]'$  Tendency away from neutral '[x]'$  Flexible '[]'$  Self-correction '[]'$  External correction Obliquity (viewed from front)  '[x]'$  WFL '[]'$  R Obliquity '[]'$  L  Obliquity  '[]'$  Fixed - No movement '[]'$  Tendency away from neutral '[]'$  Flexible '[]'$  Self-correction '[]'$  External correction Rotation  '[x]'$  WFL '[]'$  R anterior '[]'$  L anterior  '[]'$  Fixed - No movement '[]'$  Tendency away from neutral '[]'$  Flexible '[]'$  Self-correction '[]'$  External correction Tonal Influence Pelvis:  '[x]'$  Normal '[]'$  Flaccid '[]'$  Low tone '[]'$  Spasticity '[]'$  Dystonia '[]'$  Pelvis thrust '[]'$  Other:    Trunk Anterior/Posterior:  '[]'$  WFL '[x]'$  Thoracic kyphosis '[]'$  Lumbar lordosis  '[]'$  Fixed - No movement '[x]'$  Tendency away from neutral '[]'$  Flexible '[]'$  Self-correction '[]'$  External correction  '[x]'$  WFL '[]'$  Convex to left  '[]'$  Convex to right '[]'$  S-curve   '[]'$  C-curve '[]'$  Multiple curves '[]'$  Tendency away from neutral '[]'$  Flexible '[]'$  Self-correction '[]'$  External correction Rotation of shoulders and upper trunk:  '[]'$  Neutral '[]'$  Left-anterior '[x]'$  Right- anterior '[]'$  Fixed- no movement '[]'$  Tendency away from neutral '[]'$  Flexible '[]'$  Self correction '[]'$  External correction Tonal influence Trunk:  '[x]'$  Normal '[]'$  Flaccid '[]'$  Low tone '[]'$  Spasticity '[]'$  Dystonia '[]'$  Other:  Head & Neck  '[]'$  Functional '[x]'$  Flexed    '[]'$  Extended '[]'$  Rotated right  '[]'$  Rotated left '[]'$  Laterally flexed right '[]'$  Laterally flexed left '[]'$  Cervical hyperextension   '[x]'$  Good head control '[]'$  Adequate head control '[]'$  Limited head control '[]'$  Absent head control Describe tone/movement of head and neck: [facility]     Lower Extremity Measurements: LE ROM:  Active ROM Right 09/16/2022 Left 09/16/2022  Hip flexion decreased decreased  Hip extension    Hip abduction    Hip adduction    Knee flexion [facility]  Knee extension [facility]  Ankle dorsiflexion [facility] WFL  Ankle plantarflexion WFL WFL   (Blank rows = not tested)  LE MMT:  MMT Right 09/16/2022 Left 09/16/2022  Hip flexion 2- 2-  Hip extension    Hip abduction    Hip adduction    Knee flexion 3 3  Knee extension 3 3  Ankle dorsiflexion 3 3  Ankle plantarflexion 3 3    (Blank rows = not tested)  Hip positions:  '[]'$  Neutral   '[x]'$  Abducted   '[]'$  Adducted  '[]'$  Subluxed   '[]'$  Dislocated   '[]'$  Fixed   '[]'$  Tendency away from neutral '[x]'$  Flexible '[x]'$  Self-correction '[]'$  External correction   Hip Windswept:'[x]'$  Neutral  '[]'$  Right    '[]'$  Left  '[]'$  Subluxed   '[]'$  Dislocated   '[]'$  Fixed   '[]'$  Tendency away from neutral '[]'$  Flexible '[]'$  Self-correction '[]'$  External correction  LE Tone: '[x]'$  Normal '[]'$  Low tone '[]'$  Spasticity '[]'$  Flaccid '[]'$  Dystonia '[]'$  Rocks/Extends at hip '[]'$  Thrust into knee extension '[]'$  Pushes legs downward into footrest  Foot positioning: WFL ROM Concerns: Dorsiflexed: '[]'$  Right   '[]'$  Left Plantar flexed: '[]'$  Right    '[]'$  Left Inversion: '[]'$  Right    '[]'$  Left Eversion: '[]'$  Right    '[]'$  Left  LE Edema: '[]'$  1+ (Barely detectable impression when finger is pressed into skin) '[x]'$  2+ (slight indentation. 15 seconds to rebound) '[]'$  3+ (deeper indentation. 30 seconds to rebound) '[]'$  4+ (>30 seconds to rebound)  UE Measurements:  UPPER EXTREMITY ROM:   Active ROM Right 09/16/2022 Left 09/16/2022  Shoulder flexion 90 degrees WFL  Shoulder abduction 90 degrees WFL  Shoulder adduction    Elbow flexion [facility] WFL  Elbow extension [facility] WFL  Wrist flexion [facility] WFL  Wrist extension WFL WFL  (Blank rows = not tested)  UPPER EXTREMITY MMT:  MMT Right 09/16/2022 Left 09/16/2022  Shoulder flexion 3 3  Shoulder abduction 3 3  Shoulder adduction    Elbow flexion 4 3  Elbow extension 4 3  Wrist flexion    Wrist extension    Pinch strength    Grip strength    (Blank rows = not tested)  Shoulder Posture:  Right Tendency towards Left  '[]'$   Functional '[x]'$    '[x]'$   Elevation '[]'$    '[]'$   Depression '[]'$    '[]'$   Protraction '[]'$    '[]'$   Retraction '[]'$    '[]'$   Internal rotation '[]'$    '[]'$   External rotation '[]'$    '[]'$   Subluxed '[]'$     UE Tone: '[x]'$  Normal '[]'$  Flaccid '[]'$  Low tone '[]'$  Spasticity  '[]'$  Dystonia '[]'$  Other:   UE Edema: '[]'$  1+ (Barely detectable impression when finger is  pressed into skin) '[]'$  2+ (slight indentation. 15 seconds to rebound) '[]'$  3+ (deeper indentation. 30 seconds to rebound) '[]'$  4+ (>30 seconds to rebound)  Wrist/Hand: Handedness: '[x]'$  Right   '[]'$  Left   '[]'$   NA: Comments:  Right  Left  '[]'$   WNL '[]'$    '[]'$   Limitations '[]'$    '[]'$   Contractures '[]'$    '[]'$   Fisting '[]'$    '[]'$   Tremors '[]'$    '[x]'$   Weak grasp '[x]'$    '[]'$   Poor dexterity '[]'$    '[]'$   Hand movement non functional '[]'$    '[]'$   Paralysis '[]'$         MOBILITY BASE RECOMMENDATIONS and JUSTIFICATION:  MOBILITY BASE  JUSTIFICATION   Manufacturer:   English as a second language teacher: [id number]                   Color:  Seat Width:  22 inches Seat Depth: 22 inches    '[]'$  Manual mobility base (continue below)   '[]'$  Scooter/POV  '[x]'$  Power mobility base   Number of hours per day spent in above selected mobility base: 16 hours  Typical daily mobility base use Schedule: Pt would utilize this device for all mobility in his home as well as would utilize the device to access the community for doctor's appointments, to be able to engage in community events, etc.   '[x]'$  is not a safe, functional ambulator  '[x]'$  limitation prevents from completing a MRADL(s) within a reasonable time frame    '[x]'$  limitation places at high risk of morbidity or mortality secondary to  the attempts to perform a    MRADL(s)  '[x]'$  limitation prevents accomplishing a MRADL(s) entirely  '[x]'$  provide independent mobility  '[x]'$  equipment is a lifetime medical need  '[x]'$  walker or cane inadequate  '[x]'$  any type manual wheelchair      inadequate  '[x]'$  scooter/POV inadequate      '[]'$  requires dependent mobility           POWER MOBILITY      '[]'$  Scooter/POV    '[]'$  can safely operate   '[]'$  can safely transfer   '[]'$  has adequate trunk stability   '[]'$  cannot functionally propel  manual wheelchair    '[x]'$  Power mobility base    '[]'$  non-ambulatory   '[x]'$  cannot functionally propel manual wheelchair   '[x]'$  cannot functionally and safely      operate scooter/POV  '[x]'$   can safely operate power       wheelchair  '[x]'$  home is accessible  '[x]'$  willing to use power wheelchair     Tilt  '[]'$  Powered tilt on powered chair  '[]'$  Powered tilt on manual chair  '[]'$  Manual tilt on manual chair Comments:  '[]'$  change position for pressure      '[]'$  elief/cannot weight shift   '[]'$  change position against      gravitational force on head and      shoulders   '[]'$  decrease pain  '[]'$  blood pressure management   '[]'$  control autonomic dysreflexia  '[]'$  decrease respiratory distress  '[]'$  management of spasticity  '[]'$  management of low tone  '[]'$  facilitate postural control   '[]'$  rest periods   '[]'$  control edema  '[]'$  increase sitting tolerance   '[]'$  aid with transfers     Recline   '[]'$  Power recline on power chair  '[]'$  Manual recline on manual chair  Comments:    '[]'$  intermittent catheterization  '[]'$  manage spasticity  '[]'$  accommodate femur to back angle  '[]'$  change position for pressure relief/cannot weight shift rhigh risk of pressure sore development  '[]'$  tilt alone does not accomplish     effective pressure relief, maximum pressure relief achieved at -  _______ degrees tilt   _______ degrees recline   '[]'$  difficult to transfer to and from bed '[]'$  rest periods and sleeping in chair  '[]'$  repositioning for transfers  '[]'$  bring to full recline for ADL care  '[]'$  clothing/diaper changes in chair  '[]'$  gravity PEG tube feeding  '[]'$  head positioning  '[]'$  decrease pain  '[]'$  blood pressure management   '[]'$  control autonomic dysreflexia  '[]'$  decrease respiratory distress  '[]'$  user on ventilator     Elevator on mobility base  '[x]'$  Power wheelchair  '[]'$  Scooter  '[x]'$  increase Indep in transfers   '[x]'$  increase Indep in ADLs    '[x]'$  bathroom function and safety  '[x]'$  kitchen/cooking function and safety  '[]'$  shopping  '[]'$  raise height for communication at standing level  '[]'$  raise height for eye contact which reduces cervical neck strain and pain  '[]'$  drive at raised height for safety and navigating crowds   '[]'$  Other:   '[]'$  Vertical position system  (anterior tilt)     (Drive locks-out)    '[]'$  Stand       (Drive enabled)  '[]'$  independent weight bearing  '[]'$  decrease joint contractures  '[]'$  decrease/manage spasticity  '[]'$  decrease/manage spasms  '[]'$  pressure distribution away from   scapula, sacrum, coccyx, and ischial tuberosity  '[]'$  increase digestion and elimination   '[]'$  access to counters and cabinets  '[]'$  increase reach  '[]'$  increase interaction with others at eye level, reduces neck strain  '[]'$  increase performance of       MRADL(s)      Power elevating legrest    '[]'$  Center mount (Single) 85-170 degrees       '[]'$  Standard (Pair) 100-170 degrees  '[]'$  position legs at 90 degrees, not available with std power ELR  '[]'$  center mount tucks into chair to decrease turning radius in home, not available with std power ELR  '[]'$  provide change in position for LE  '[]'$  elevate legs during recline    '[]'$  maintain placement of feet on      footplate  '[]'$  decrease edema  '[]'$  improve circulation  '[]'$  actuator needed to elevate legrest  '[]'$  actuator needed to articulate legrest preventing knees from flexing  '[]'$  Increase ground clearance over      curbs  '[]'$   STD (pair) independently                     elevate legrest   POWER WHEELCHAIR CONTROLS      Controls/input device  '[]'$  Expandable  '[x]'$  Non-expandable  '[x]'$  Proportional  '[x]'$  Right Hand '[]'$  Left Hand  '[]'$  Non-proportional/switches/head-array  '[]'$  Electrical/proximity         '[]'$   Mechanical      Manufacturer:_______Merits________   Type:___P327 Vision Super HD___________________ '[x]'$  provides access for controlling wheelchair  '[x]'$  programming for accurate control  '[]'$  progressive disease/changing condition  '[]'$  required for alternative drive      controls       '[]'$  lacks motor control to operate  proportional drive control  '[]'$  unable to understand proportional controls  '[]'$  limited movement/strength  '[]'$  extraneous movement / tremors / ataxic / spastic        '[]'$  Upgraded electronics controller/harness    '[]'$  Single power (tilt or recline)   '[]'$  Expandable    '[]'$  Non-expandable plus   '[]'$  Multi-power (tilt, recline, power legrest, power seat lift, vertical positioning system, stand)  '[]'$  allows input device to communicate with drive motors  '[]'$   harness provides necessary connections between the controller, input device, and seat functions     '[]'$  needed in order to operate power seat functions through joystick/ input device  '[]'$  required for alternative drive controls     '[]'$  Enhanced display  '[]'$  required to connect all alternative drive controls   '[]'$  required for upgraded joystick      (lite-throw, heavy duty, micro)  '[]'$  Allows user to see in which mode and drive the wheelchair is set; necessary for alternate controls       '[]'$  Upgraded tracking electronics  '[]'$  correct tracking when on uneven surfaces makes switch driving more efficient and less fatiguing  '[]'$  increase safety when driving  '[]'$  increase ability to traverse thresholds    '[]'$  Safety / reset / mode switches     Type:    '[]'$  Used to change modes and stop the wheelchair when driving     '[x]'$  Mount for joystick / input device/switches  '[x]'$  swing away for access or transfers   '[x]'$  attaches joystick / input device / switches to wheelchair   '[x]'$  provides for consistent access  '[]'$  midline for optimal placement    '[]'$  Attendant controlled joystick plus     mount  '[]'$  safety  '[]'$  long distance driving  '[]'$  operation of seat functions  '[]'$  compliance with transportation regulations    '[x]'$  Battery  '[x]'$  required to power (power assist / scooter/ power wc / other):   '[]'$  Power inverter (24V to 12V)  '[]'$  required for ventilator / respiratory equipment / other:     CHAIR OPTIONS MANUAL & POWER      Armrests   '[x]'$  adjustable height '[]'$  removable  '[]'$  swing away '[]'$  fixed  '[x]'$  flip back  '[]'$  reclining  '[x]'$  full length pads '[]'$  desk '[]'$  tube arms '[]'$  gel pads  '[x]'$  provide support with elbow at 90    '[x]'$  remove/flip  back/swing away for  transfers  '[x]'$  provide support and positioning of upper body    '[]'$  allow to come closer to table top  '[]'$  remove for access to tables  '[]'$  provide support for w/c tray  '[]'$  change of height/angles for       variable activities   '[]'$  Elbow support / Elbow stop  '[]'$  keep elbow positioned on arm pad  '[]'$  keep arms from falling off arm pad  during tilt and/or recline   Upper Extremity Support  '[]'$  Arm trough  '[]'$   R  '[]'$   L  Style:  '[]'$  swivel mount '[]'$  fixed mount   '[]'$  posterior hand support  '[]'$   tray  '[]'$  full tray  '[]'$  joystick cut out  '[]'$   R  '[]'$   L  Style:  '[]'$  decrease gravitational pull on      shoulders  '[]'$  provide support to increase UE  function  '[]'$  provide hand support in natural    position  '[]'$  position flaccid UE  '[]'$  decrease subluxation    '[]'$  decrease edema       '[]'$  manage spasticity   '[]'$  provide midline positioning  '[]'$  provide work surface  '[]'$  placement for AAC/ Computer/ EADL       Hangers/ Legrests   '[]'$  ______ degree  '[]'$  Elevating '[]'$  articulating  '[]'$  swing away '[]'$  fixed '[]'$  lift off  '[]'$  heavy duty '[]'$  adjustable knee angle  '[]'$  adjustable calf panel   '[]'$  longer extension tube              '[]'$   provide LE support  '[]'$  maintain placement of feet on      footplate   '[]'$  accommodate lower leg length  '[]'$  accommodate to hamstring       tightness  '[]'$  enable transfers  '[]'$  provide change in position for LE's  '[]'$  elevate legs during recline    '[]'$  decrease edema  '[]'$  durability      Foot support   '[x]'$  footplate '[]'$  R '[]'$  L '[x]'$  flip up           '[]'$  Depth adjustable   '[x]'$  angle adjustable  '[]'$  foot board/one piece    '[]'$  provide foot support  '[]'$  accommodate to ankle ROM  '[]'$  allow foot to go under wheelchair base  '[]'$  enable transfers     '[]'$  Shoe holders  '[]'$  position foot    '[]'$  decrease / manage spasticity  '[]'$  control position of LE  '[]'$  stability    '[]'$  safety     '[]'$  Ankle strap/heel      loops  '[]'$  support foot on foot support  '[]'$  decrease extraneous movement  '[]'$   provide input to heel   '[]'$  protect foot     '[]'$  Amputee adapter '[]'$  R  '[]'$  L     Style:                  Size:  '[]'$  Provide support for stump/residual extremity    '[x]'$  Transportation tie-down  '[x]'$  to provide crash tested tie-down brackets    '[]'$  Crutch/cane holder    '[]'$  O2 holder    '[]'$  IV hanger   '[]'$  Ventilator tray/mount    '[]'$  stabilize accessory on wheelchair       Component  Justification     '[]'$  Seat cushion      '[]'$  accommodate impaired sensation  '[]'$  decubitus ulcers present or history  '[]'$  unable to shift weight  '[]'$  increase pressure distribution  '[]'$  prevent pelvic extension  '[]'$  custom required "off-the-shelf"    seat cushion will not accommodate deformity  '[]'$  stabilize/promote pelvis alignment  '[]'$  stabilize/promote femur alignment  '[]'$  accommodate obliquity  '[]'$  accommodate multiple deformity  '[]'$  incontinent/accidents  '[]'$  low maintenance     '[]'$  seat mounts                 '[]'$  fixed '[]'$  removable  '[]'$  attach seat platform/cushion to wheelchair frame    '[]'$  Seat wedge    '[]'$  provide increased aggressiveness of seat shape to decrease sliding  down in the seat  '[]'$  accommodate ROM        '[]'$  Cover replacement   '[]'$  protect back or seat cushion  '[]'$  incontinent/accidents    '[]'$  Solid seat / insert    '[]'$  support cushion to prevent      hammocking  '[]'$  allows attachment of cushion to mobility base    '[]'$  Lateral pelvic/thigh/hip     support (Guides)     '[]'$  decrease abduction  '[]'$  accommodate pelvis  '[]'$  position upper legs  '[]'$  accommodate spasticity  '[]'$  removable for transfers     '[]'$  Lateral pelvic/thigh      supports mounts  '[]'$  fixed   '[]'$  swing-away   '[]'$  removable  '[]'$  mounts lateral pelvic/thigh supports     '[]'$  mounts lateral pelvic/thigh supports swing-away or removable for transfers    '[]'$  Medial thigh support (Pommel)  '[]'$ decrease adduction  '[]'$ accommodate ROM  '[]'$  remove for transfers   '[]'$  alignment      '[]'$   Medial thigh   '[]'$  fixed      support mounts      '[]'$  swing-away   '[]'$  removable  '[]'$   mounts medial thigh supports   '[]'$  Mounts medial supports swing- away or removable for transfers       Component  Justification   '[]'$  Back       '[]'$  provide posterior trunk support '[]'$  facilitate tone  '[]'$  provide lumbar/sacral support '[]'$  accommodate deformity  '[]'$  support trunk in midline   '[]'$  custom required "off-the-shelf" back support will not accommodate deformity   '[]'$  provide lateral trunk support '[]'$  accommodate or decrease tone            '[]'$  Back mounts  '[]'$  fixed  '[]'$  removable  '[]'$  attach back rest/cushion to wheelchair frame   '[]'$  Lateral trunk      supports  '[]'$  R '[]'$  L  '[]'$  decrease lateral trunk leaning  '[]'$  accommodate asymmetry    '[]'$  contour for increased contact  '[]'$  safety    '[]'$  control of tone    '[]'$  Lateral trunk      supports mounts  '[]'$  fixed  '[]'$  swing-away   '[]'$  removable  '[]'$  mounts lateral trunk supports     '[]'$  Mounts lateral trunk supports swing-away or removable for transfers   '[]'$  Anterior chest      strap, vest     '[]'$  decrease forward movement of shoulder  '[]'$  decrease forward movement of trunk  '[]'$  safety/stability  '[]'$  added abdominal support  '[]'$  trunk alignment  '[]'$  assistance with shoulder control   '[]'$  decrease shoulder elevation    '[]'$  Headrest      '[]'$  provide posterior head support  '[]'$  provide posterior neck support  '[]'$  provide lateral head support  '[]'$  provide anterior head support  '[]'$  support during tilt and recline  '[]'$  improve feeding     '[]'$  improve respiration  '[]'$  placement of switches  '[]'$  safety    '[]'$  accommodate ROM   '[]'$  accommodate tone  '[]'$  improve visual orientation   '[]'$  Headrest           '[]'$  fixed '[]'$  removable '[]'$  flip down      Mounting hardware   '[]'$  swing-away laterals/switches  '[]'$  mount headrest   '[]'$  mounts headrest flip down or  removable for transfers  '[]'$  mount headrest swing-away laterals   '[]'$  mount switches     '[]'$  Neck Support    '[]'$  decrease neck rotation  '[]'$  decrease forward neck flexion   Pelvic Positioner    '[x]'$  std hip belt          '[]'$   padded hip belt  '[]'$  dual pull hip belt  '[]'$  four point hip belt  '[]'$  stabilize tone  '[x]'$  decrease falling out of chair  '[]'$  prevent excessive extension  '[]'$  special pull angle to control      rotation  '[]'$  pad for protection over boney   prominence  '[]'$  promote comfort    '[]'$  Essential needs        bag/pouch   '[]'$  medicines '[]'$  special food rorthotics '[]'$  clothing changes  '[]'$  diapers  '[]'$  catheter/hygiene '[]'$  ostomy supplies   The above equipment has a life- long use expectancy.  Growth and changes in medical and/or functional conditions would be the exceptions.   SUMMARY:  Why mobility device was selected; include why a lower level device is not appropriate: Pt currently requires min to mod A to stand to a RW and  is unable to tolerate standing for longer than one minute. He is able unable to ambulate further than 10 ft with a RW and is not a functional ambulatory. He also has a history of R shoulder arthroscopy in April of 2012 with resulting limited R shoulder flexion and abduction ROM to 90 degrees as well as decreased strength in this limb so pt is unable to propel himself in manual wheelchair. Pt requires a scooter chair for safe and independent mobility and in order to complete his MRADLs.  Twanna Hy received his initial power chair in April of 2017. At that time, we weighed 387 pounds and was approved for a [id number]. He has since lost some weight but remains heavy at 265 pounds. He was assessed today for a power chair and based on his weight and height of 6 feet 2 inches, I feel that he would better benefit from a [id number] vs. [id number]. Mr. Burruel will need a 22-seat width because he carries an excessive amount of weight in his hip and buttock area. A 22-seat width can only be fitted on a [id number], and this would permit him the right amount of seat width for comfort and back support. 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  We understand that a [phone] requires that a patient weigh between 300 and 450 pounds, but based on today's assessment, his lack of mobility, eating rituals, and his physical appearance, the [id number] is the best option.  ASSESSMENT:  CLINICAL IMPRESSION: Patient is a 67 y.o. male who was seen today for physical therapy evaluation and treatment for a new power wheelchair. Pt requires power wheeled mobility to safely perform MRADLs and to be able to navigate his home environment safety and independently on a daily basis as well as to access the community.  OBJECTIVE IMPAIRMENTS Abnormal gait, cardiopulmonary status limiting activity, decreased activity tolerance, decreased balance, decreased endurance, decreased mobility, difficulty walking, decreased strength, dizziness, increased edema, impaired perceived functional ability, impaired sensation, impaired UE functional use, and pain.   ACTIVITY LIMITATIONS carrying, lifting, bending, standing, squatting, stairs, transfers, and toileting  PARTICIPATION LIMITATIONS: meal prep, cleaning, laundry, driving, and community activity  PERSONAL FACTORS Fitness, Time since onset of injury/illness/exacerbation, Transportation, and 3+ comorbidities:     A.fib on systolic CHF with EF [id number], NICM, ureteral calculus OSA (has CPAP but not using.  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 are also affecting patient's functional outcome.   REHAB POTENTIAL: Fair : time since onset; chronicity of condition leading to need for power wheeled mobility device  CLINICAL DECISION MAKING: Stable/uncomplicated  EVALUATION COMPLEXITY: High                                    GOALS: One time visit. No goals established.    PLAN: PT FREQUENCY: one time visit    Excell Seltzer, PT, DPT, CSRS 09/16/2022, 10:38 AM    I concur with the above findings and recommendations of the therapist:  Physician name printed:         Physician's signature:  Date:

## 2022-09-24 ENCOUNTER — Telehealth: Payer: Self-pay

## 2022-09-24 NOTE — Telephone Encounter (Signed)
Documents received from [employer] and left for Dr Wynetta Emery to complete

## 2022-09-24 NOTE — Telephone Encounter (Signed)
I spoke to [employer] and she stated that she will fax me a copy of the PT seating eval, along with 3 other documents that need PCP signature after the face to face eval.  I spoke to the patient and scheduled him for the face to face eval on 10/02/2022 with Dr Wynetta Emery

## 2022-10-01 ENCOUNTER — Ambulatory Visit: Payer: Medicare HMO | Admitting: Physician Assistant

## 2022-10-02 ENCOUNTER — Encounter: Payer: Self-pay | Admitting: Internal Medicine

## 2022-10-02 ENCOUNTER — Ambulatory Visit: Payer: Medicare HMO | Attending: Physician Assistant | Admitting: Internal Medicine

## 2022-10-02 VITALS — BP 126/75 | HR 87 | Ht 74.0 in | Wt 266.0 lb

## 2022-10-02 DIAGNOSIS — I5022 Chronic systolic (congestive) heart failure: Secondary | ICD-10-CM | POA: Diagnosis not present

## 2022-10-02 DIAGNOSIS — R296 Repeated falls: Secondary | ICD-10-CM

## 2022-10-02 DIAGNOSIS — E669 Obesity, unspecified: Secondary | ICD-10-CM | POA: Diagnosis not present

## 2022-10-02 DIAGNOSIS — Z982 Presence of cerebrospinal fluid drainage device: Secondary | ICD-10-CM | POA: Diagnosis not present

## 2022-10-02 DIAGNOSIS — I480 Paroxysmal atrial fibrillation: Secondary | ICD-10-CM

## 2022-10-02 DIAGNOSIS — R269 Unspecified abnormalities of gait and mobility: Secondary | ICD-10-CM | POA: Diagnosis not present

## 2022-10-02 DIAGNOSIS — R42 Dizziness and giddiness: Secondary | ICD-10-CM | POA: Diagnosis not present

## 2022-10-02 DIAGNOSIS — G910 Communicating hydrocephalus: Secondary | ICD-10-CM

## 2022-10-02 DIAGNOSIS — G9782 Other postprocedural complications and disorders of nervous system: Secondary | ICD-10-CM

## 2022-10-02 NOTE — Progress Notes (Signed)
Patient ID: Brandon Marks, male    DOB: February 09, 1956  MRN: [id number]  CC: Patient here for mobility evaluation for power wheelchair Follow-up (In person eval for power wheelchair. Med refill. /Requesting disability parking form/Requesting blood work for DM - feeling dizzy, falls, headaches/Yes to colonoscopy. No to flu vax. )   Subjective: Brandon Marks is a 67 y.o. male who presents for mobility evaluation for power wheelchair His concerns today include:  Pt with hx of HTN, A.fib on systolic CHF with EF [id number], NICM, ureteral calculus OSA (has CPAP but not using.  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     Since last visit, he was seen by physical therapy 09/16/2022 for evaluation for ongoing need for a mobility chair.  Patient has had one for the past 8 years but it has given out on him.  Patient has history of systolic congestive heart failure, paroxysmal atrial fibrillation on blood thinner and residual dizziness status post resection of an acoustic neuroma 12/2019. 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  He has also had surgery on his right shoulder in 2012 which has left him with limited range of motion in that joint and decreased strength.   Patient reports "I get swimmy headed and off balance" very easily if he gets to moving too quickly or tries to get up too quickly.  Use to get bad HA prior to VP shunt placement but not as much any more.  Due to his congestive heart failure, he is limited in the distance that he can walk without becoming fatigue.  He has had about 4-5 falls in the past 6 months.  Most recently, he tripped and fell in his house bruising his right knee.  2 weeks ago he also tripped and fell while  going into a restaurant even though he had his cane with him.  He has a friend who helps with some of his ADLs mainly cleaning and preparing some meals.  He used his mobility chair to get from one room to the other in his house including his toilet.  It allowed him to perform some of his ADLs without becoming too fatigued/SOB related to his CHF.  He also used it to get to his car and his mailbox.  He has a cane, walker and wheelchair at home.  He has had falls with a cane and walker because his balance is off ever since he had surgery for the acoustic neuroma  He is unable to use a manual wheelchair effectively due to easy fatigue and poor range of motion in the right shoulder to self propel.  He is requesting a handicap sticker.  Would like to be screened for diabetes.  He wonders sometimes whether the dizziness may be due to DM.  Blood sugars on last several chem have been normal.  He reports his portion sizes are small.  Eats out a lot and when he is not able to get out, he eats frozen meals. In regards to the CHF: Denies any lower extremity edema palpitations or bleeding on Eliquis.  Some resolving bruising on RT knee from fall 2 wks ago Patient Active Problem List   Diagnosis Date Noted   Calcium oxalate calculus 07/06/2021   NICM (nonischemic cardiomyopathy) ([place])  Atrial fibrillation with rapid ventricular response ([place]) 06/08/2021   Sepsis secondary to UTI ([place]) 06/08/2021   Severe sepsis with septic shock ([place]) 06/06/2021   Left ureteral calculus 06/06/2021   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[place]) 02/22/2021   23-polyvalent pneumococcal polysaccharide vaccine declined 02/22/2021   Status post excision of acoustic neuroma 02/07/2020   S/P right TKA 09/13/2019   S/P left TKA 08/11/2019   Genu varum of both lower extremities 06/13/2019   Vitamin B12 deficiency 06/13/2019   Obesity (BMI 30-39.9) 12/27/2018   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   Tinnitus of right  ear 12/27/2018   Gait disturbance 12/27/2018   Normochromic anemia 12/27/2018   Primary osteoarthritis of both knees [id number]   Diastolic heart failure ([place]) 09/11/2017   DDD (degenerative disc disease), cervical 09/04/2017   Dyspnea, chronic DOE 02/27/2012   Herniated disc    Hyperlipidemia 0000000   Diastolic dysfunction, left ventricle 02/23/2009   Essential hypertension 10/18/2007   OSA on CPAP 10/11/2007     Current Outpatient Medications on File Prior to Visit  Medication Sig Dispense Refill   acetaminophen (TYLENOL) 325 MG tablet Take 325-650 mg by mouth every 6 (six) hours as needed for headache.     apixaban (ELIQUIS) 5 MG TABS tablet Take 1 tablet (5 mg total) by mouth 2 (two) times daily. 180 tablet 1   atorvastatin (LIPITOR) 10 MG tablet Take 1 tablet (10 mg total) by mouth daily. 90 tablet 1   cetirizine (ZYRTEC) 5 MG tablet Take 1 tablet (5 mg total) by mouth daily. 30 tablet 0   cyanocobalamin 1000 MCG tablet Take 3,000 mcg by mouth daily.     docusate sodium (COLACE) 100 MG capsule Take 1 capsule (100 mg total) by mouth 2 (two) times daily as needed for mild constipation. (Patient taking differently: Take 100 mg by mouth daily.) 10 capsule 0   famotidine (PEPCID) 20 MG tablet Take 1 tablet (20 mg total) by mouth 2 (two) times daily. 30 tablet 0   furosemide (LASIX) 20 MG tablet Take 1 tablet (20 mg total) by mouth daily as needed for edema (for swelling in legs/ankle). 30 tablet 3   losartan (COZAAR) 25 MG tablet Take 0.5 tablets (12.5 mg total) by mouth daily. 30 tablet 3   triamcinolone cream (KENALOG) 0.1 % Apply 1 Application topically 2 (two) times daily. 30 g 0   No current facility-administered medications on file prior to visit.    Allergies  Allergen Reactions   Codeine Nausea And Vomiting   Oxycodone Other (See Comments)    Upset GI    Social History   Socioeconomic History   Marital status: Divorced    Spouse name: Not on file   Number of  children: 0   Years of education: some college   Highest education level: Not on file  Occupational History   Occupation: Retired  Tobacco Use   Smoking status: Never   Smokeless tobacco: Never  Vaping Use   Vaping Use: Never used  Substance and Sexual Activity   Alcohol use: No   Drug use: No   Sexual activity: Not on file  Other Topics Concern   Not on file  Social History Narrative   Denies caffeine use    Social Determinants of Health   Financial Resource Strain: Low Risk  (06/27/2022)   Overall Financial Resource Strain (CARDIA)    Difficulty of Paying Living Expenses: Not hard at all  Food Insecurity: Food Insecurity Present (06/27/2022)   Hunger  Vital Sign    Worried About Charity fundraiser in the Last Year: Often true    Ran Out of Food in the Last Year: Often true  Transportation Needs: No Transportation Needs (06/27/2022)   PRAPARE - Hydrologist (Medical): No    Lack of Transportation (Non-Medical): No  Physical Activity: Insufficiently Active (06/27/2022)   Exercise Vital Sign    Days of Exercise per Week: 5 days    Minutes of Exercise per Session: 10 min  Stress: No Stress Concern Present (06/27/2022)   [place]    Feeling of Stress : Not at all  Social Connections: Socially Isolated (06/27/2022)   Social Connection and Isolation Panel [NHANES]    Frequency of Communication with Friends and Family: More than three times a week    Frequency of Social Gatherings with Friends and Family: More than three times a week    Attends Religious Services: Never    Marine scientist or Organizations: No    Attends Archivist Meetings: Never    Marital Status: Divorced  Human resources officer Violence: Not At Risk (06/27/2022)   Humiliation, Afraid, Rape, and Kick questionnaire    Fear of Current or Ex-Partner: No    Emotionally Abused: No    Physically Abused: No     Sexually Abused: No    Family History  Problem Relation Age of Onset   Hypertension Mother    Heart failure Mother    Alzheimer's disease Mother    Skin cancer Mother    Heart disease Father    Alzheimer's disease Maternal Grandmother    Alzheimer's disease Maternal Grandfather     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 at [facility]  12/27/2019   accoustic neuroma   CARDIAC CATHETERIZATION  02/20/2009   normal coronary arteries   CYSTOSCOPY WITH STENT PLACEMENT Left 06/05/2021   Procedure: CYSTOSCOPY WITH STENT PLACEMENT;  Surgeon: Hollice Espy, MD;  Location: [facility];  Service: Urology;  Laterality: Left;   CYSTOSCOPY/URETEROSCOPY/HOLMIUM LASER/STENT PLACEMENT Left 07/01/2021   Procedure: CYSTOSCOPY/URETEROSCOPY/HOLMIUM LASER/STENT EXCHANGE;  Surgeon: Hollice Espy, MD;  Location: [facility];  Service: Urology;  Laterality: Left;   EYE SURGERY     bilateral cataract with lens implant   INCISION AND DRAINAGE PERIRECTAL ABSCESS N/A 01/02/2018   Procedure: IRRIGATION AND DEBRIDEMENT PERIRECTAL ABSCESS;  Surgeon: Coralie Keens, MD;  Location: [place];  Service: General;  Laterality: N/A;   IRRIGATION AND DEBRIDEMENT BUTTOCKS N/A 01/05/2018   Procedure: DEBRIDEMENT BUTTOCKS ABSCESS;  Surgeon: Georganna Skeans, MD;  Location: [place];  Service: General;  Laterality: N/A;   KNEE ARTHROSCOPY  2010   Lt   LEFT HEART CATH AND CORONARY ANGIOGRAPHY N/A 06/10/2021   Procedure: LEFT HEART CATH AND CORONARY ANGIOGRAPHY;  Surgeon: Wellington Hampshire, MD;  Location: [place] [facility];  Service: Cardiovascular;  Laterality: N/A;   NM MYOCAR PERF WALL MOTION  09/28/2007   small area of reversibility in the anterolateral wall at the apex concerning for ischemia   SHOULDER ARTHROSCOPY  10/2010   Rt   TOTAL KNEE ARTHROPLASTY Left 08/11/2019   Procedure: TOTAL KNEE ARTHROPLASTY;  Surgeon: Paralee Cancel, MD;  Location: [facility];  Service: Orthopedics;   Laterality: Left;  70 mins   TOTAL KNEE ARTHROPLASTY Right 09/13/2019   Procedure: TOTAL KNEE ARTHROPLASTY;  Surgeon: Paralee Cancel, MD;  Location: [facility];  Service: Orthopedics;  Laterality:  Right;  70 mins   WOUND DEBRIDEMENT N/A 01/04/2018   Procedure: IRRIGATION AND DEBRIDEMENT OF BUTTOCKS AND REMOVAL OF TICK FROM RIGHT TESTICLE;  Surgeon: Georganna Skeans, MD;  Location: [place];  Service: General;  Laterality: N/A;    ROS: Review of Systems Negative except as stated above  PHYSICAL EXAM: BP 126/75   Pulse 87   Ht '6\' 2"'$  (1.88 m)   Wt 266 lb (120.7 kg)   SpO2 98%   BMI 34.15 kg/m   Wt Readings from Last 3 Encounters:  10/02/22 266 lb (120.7 kg)  05/30/22 257 lb 3.2 oz (116.7 kg)  04/25/22 253 lb (114.8 kg)    Physical Exam   General appearance - alert, well appearing, elderly caucasian male and in no distress Mental status - normal mood, behavior, speech, dress, motor activity, and thought processes Eyes - pink conjunctiva Mouth - moist oral mucosa Neck - supple, no significant adenopathy Chest - clear to auscultation, no wheezes, rales or rhonchi, symmetric air entry Heart -sounds to be in sinus rhythm at this time. Neuro -grip 3+/5 bilaterally; upper extremities 4/5 proximally distally.  Limited rotation of the right shoulder.  Power LE 4/5 BL distally, 3-4/5 proximally BL. Mild to moderate difficulty getting up from chair.  Has to rock then push up some with hands to stand up. Stoop posture with flexion of thoracic spine. He has a waddling gait. Low foot to floor clearance.  Intermittently places a hand on the wall to steady himself especially when making turns Extremities - trace LE edema     Latest Ref Rng & Units 03/24/2022    3:40 PM 06/13/2021    6:48 AM 06/12/2021    3:31 AM  CMP  Glucose 70 - 99 mg/dL 79  76  76   BUN 8 - 27 mg/dL '16  20  23   '$ Creatinine 0.76 - 1.27 mg/dL 1.02  1.08  0.83   Sodium 134 - 144 mmol/L 141  137  138   Potassium 3.5 - 5.2 mmol/L  5.0  4.2  4.0   Chloride 96 - 106 mmol/L 103  102  106   CO2 20 - 29 mmol/L '23  27  26   '$ Calcium 8.6 - 10.2 mg/dL 9.1  8.0  8.1   Total Protein 6.0 - 8.5 g/dL 7.4     Total Bilirubin 0.0 - 1.2 mg/dL 0.6     Alkaline Phos 44 - 121 IU/L 85     AST 0 - 40 IU/L 16     ALT 0 - 44 IU/L 12      Lipid Panel     Component Value Date/Time   CHOL 111 03/24/2022 1540   TRIG 42 03/24/2022 1540   HDL 53 03/24/2022 1540   CHOLHDL 2.1 03/24/2022 1540   LDLCALC 47 03/24/2022 1540    CBC    Component Value Date/Time   WBC 6.4 06/28/2022 1400   WBC 14.2 (H) 06/13/2021 0648   RBC 4.67 06/28/2022 1400   RBC 4.25 06/13/2021 0648   HGB 13.0 06/28/2022 1400   HCT 38.6 06/28/2022 1400   PLT 221 06/28/2022 1400   MCV 83 06/28/2022 1400   MCH 27.8 06/28/2022 1400   MCH 30.4 06/13/2021 0648   MCHC 33.7 06/28/2022 1400   MCHC 33.6 06/13/2021 0648   RDW 13.7 06/28/2022 1400   LYMPHSABS 1.2 03/24/2022 1540   MONOABS 1.0 06/13/2021 0648   EOSABS 0.0 03/24/2022 1540   BASOSABS 0.0 03/24/2022 1540  A1C 5.2, BS 139  ASSESSMENT AND PLAN:  1. Gait disturbance 2. Recurrent falls 3. Dizziness 4. 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HCC) 5. VP (ventriculoperitoneal) shunt status Patient seems appropriate and well-suited for a power wheelchair.  He has difficulty performing ADLS due to limited mobility in his home and community.  He has poor balance as a result of residual dizziness from previous brain surgery and underlaying NPH with postoperative hydrocephalus requiring placement of a VP shunt, easy fatigability associated with systolic congestive heart failure and paroxysmal atrial fibrillation. He has had several falls in the past 6 months which puts him at risk for major bleeding given that he is on blood thinner for his atrial fibrillation. He is unable to use a manual wheelchair effectively due to weak grip, easy fatigue and poor range of motion in the right shoulder. A cane or walker would  not suffice due to balance issues and easy fatigability. A POV would not suffice due to poor balance of trunk and decrease grip. A power wheelchair is needed to improve mobility by allowing him to safely negotiate his home and community environment.  He would be able to more safely perform his ADLs including meal preps, toileting and transfers, increasing his independence. The patient has the mental capacity to operate a power wheelchair safely and is willing to use it in the home. I have reviewed the physical therapist's mobility/seating evaluation for power wheelchair and agree with the evaluation/assessment. Form completed for handicap sticker - CBC - Comprehensive metabolic panel -Overdue for follow-up with [facility].  He plans to call and schedule himself  6. Chronic systolic congestive heart failure (HCC) Stable.  Continue furosemide as needed.  Continue Cozaar.  He has not been taking metoprolol.  Due for follow-up with cardiology  7. PAF (paroxysmal atrial fibrillation) (HCC) Continue Eliquis  8. Obesity (BMI 30.0-34.9) A1c not in range for diabetes. Patient advised to eliminate sugary drinks from the diet, cut back on portion sizes especially of white carbohydrates, eat more white lean meat like chicken [place] and seafood instead of beef or pork and incorporate fresh fruits and vegetables into the diet daily.  - POCT glucose (manual entry) - POCT glycosylated hemoglobin (Hb A1C)   Patient was given the opportunity to ask questions.  Patient verbalized understanding of the plan and was able to repeat key elements of the plan.   This documentation was completed using Radio producer.  Any transcriptional errors are unintentional.  Orders Placed This Encounter  Procedures   CBC   Comprehensive metabolic panel   POCT glucose (manual entry)   POCT glycosylated hemoglobin (Hb A1C)     Requested Prescriptions    No prescriptions requested or ordered  in this encounter    Return in about 4 months (around 02/01/2023).  Karle Plumber, MD, FACP

## 2022-10-03 LAB — POCT GLYCOSYLATED HEMOGLOBIN (HGB A1C): HbA1c, POC (controlled diabetic range): 5.2 % (ref 0.0–7.0)

## 2022-10-03 LAB — COMPREHENSIVE METABOLIC PANEL
ALT: 15 IU/L (ref 0–44)
AST: 20 IU/L (ref 0–40)
Albumin/Globulin Ratio: 1.4 (ref 1.2–2.2)
Albumin: 4.2 g/dL (ref 3.9–4.9)
Alkaline Phosphatase: 83 IU/L (ref 44–121)
BUN/Creatinine Ratio: 17 (ref 10–24)
BUN: 18 mg/dL (ref 8–27)
Bilirubin Total: 0.6 mg/dL (ref 0.0–1.2)
CO2: 23 mmol/L (ref 20–29)
Calcium: 8.9 mg/dL (ref 8.6–10.2)
Chloride: 105 mmol/L (ref 96–106)
Creatinine, Ser: 1.06 mg/dL (ref 0.76–1.27)
Globulin, Total: 2.9 g/dL (ref 1.5–4.5)
Glucose: 94 mg/dL (ref 70–99)
Potassium: 4.8 mmol/L (ref 3.5–5.2)
Sodium: 140 mmol/L (ref 134–144)
Total Protein: 7.1 g/dL (ref 6.0–8.5)
eGFR: 76 mL/min/{1.73_m2} (ref >59)

## 2022-10-03 LAB — CBC
Hematocrit: 42 % (ref 37.5–51.0)
Hemoglobin: 13.1 g/dL (ref 13.0–17.7)
MCH: 26.4 pg — ABNORMAL LOW (ref 26.6–33.0)
MCHC: 31.2 g/dL — ABNORMAL LOW (ref 31.5–35.7)
MCV: 85 fL (ref 79–97)
Platelets: 239 x10E3/uL (ref 150–450)
RBC: 4.96 x10E6/uL (ref 4.14–5.80)
RDW: 14.2 % (ref 11.6–15.4)
WBC: 6.6 x10E3/uL (ref 3.4–10.8)

## 2022-10-03 LAB — GLUCOSE, POCT (MANUAL RESULT ENTRY): POC Glucose: 139 mg/dl — AB (ref 70–99)

## 2022-10-09 ENCOUNTER — Telehealth: Payer: Self-pay | Admitting: Internal Medicine

## 2022-10-09 NOTE — Telephone Encounter (Signed)
April from [facility] is calling stating that their clinician is not able to approve for the pt to get the power wheelchair. It is needing review from their Market researcher. There is a peer to peer review needed with pt PCP and the medical director so they can talk and medical records will be needed at the peer to peer. The due date for the peer to peer is 10/13/2022 @ 12pm Central Standard time.  Please call back at [phone] ext [phone]

## 2022-10-10 NOTE — Telephone Encounter (Signed)
FYI

## 2022-10-10 NOTE — Telephone Encounter (Signed)
Peer to Peer is needed due to him not meeting the weight requirements.

## 2022-10-10 NOTE — Telephone Encounter (Signed)
We are not able to schedule an exact time for the peer to peer, I was informed that they will reach out to [place] and leave a message with the contact information and time frame for you to return the call.

## 2022-10-13 NOTE — Progress Notes (Unsigned)
Cardiology Office Note:    Date:  10/14/2022   ID:  Brandon Marks, DOB 02/02/1956, MRN [id number]  PCP:  Brandon Pier, MD   [place] [facility] Cardiologist:  Brandon Klein, MD     Referring MD: Brandon Pier, MD   CC: follow up for AF and fatigue.   History of Present Illness:    Brandon Marks is a 67 y.o. 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  He underwent cardiac catheterization in 2010 which showed normal coronary arteries after a false positive nuclear stress test. Stress test was completed in anticipation of bariatric surgery. He underwent repeat stress testing in 2018 which showed low risk and was negative for ischemia.   Was previously established with [facility].  He was evaluated at that practice on 03/28/2021 for new onset of atrial fibrillation.  He had presented for VP shunt placement, was noted to be in atrial fibrillation.  He was referred to EP for consideration of a Watchman procedure, as he is a high fall risk and anticoagulation was not felt to be a great option due to VP shunt placement.  He was started on Eliquis 5 mg twice a day.  He was noted to be volume overloaded and was started on Lasix 40 mg daily.  He underwent an echo on 06/07/2021 which showed an EF of 30 to 35%, global hypokinesis, RV was mildly enlarged, LA was mildly dilated, no valvular abnormalities.  He underwent a left heart catheterization on 06/10/2021 which showed no obstructive CAD, nonischemic cardiomyopathy with recommendations for medical management.  Most recently he was evaluated by Brandon Memos, NP on 04/25/2022, at that time he was doing well from a cardiac perspective.  He presents today for follow up of his fatigue. He has been out of his losartan, Eliquis, Lipitor for three days. He stopped his Toprol some  time ago, but cannot recall how long, feels like it has been a few months. He endorses ongoing fatigue and needing to take a nap during the day. He wants to take as few medications as possible and wanted to review his medications to see what he could discontinue.  We discussed his atrial fibrillation at length and the importance of adhering to his current medication regimen.  He has not worn his CPAP for some time, discussed this is likely contributing to his ongoing fatigue. He denies chest pain, palpitations, dyspnea, pnd, orthopnea, n, v, dizziness, syncope, edema, weight gain, or early satiety.   Past Medical History:  Diagnosis Date   Arthritis    Coronary artery disease    CSF leak    a. 12/2019 following resection of vestibular schwannoma; b.  12/2020 status post lumbar puncture for ventriculomegaly; e.  01/2021: Status post lumbar drain ([facility]).  Pending VP shunt.   Herniated disc    High cholesterol    History of kidney stones    History of stress test    a. 09/2007 MV: EF 63%, small area of anterolateral and apical reversibility; b.  12/2016 MV: EF 62%.  Fixed small, mild mid anteroseptal and apical defect without reversibility.  Most likely attenuation.  Low risk study.   Hypertension    Morbid obesity ([place])    PAC (premature atrial contraction)    Persistent atrial fibrillation ([place])    a. first noted on 12 lead ECG 10/2019.  Sleep apnea    non compliant with c-pap   Vestibular schwannoma ([place])    a. 12/27/2019 s/p resection ([facility]). Post-op course complicated by CSF leak/seizures req repair 01/06/2020 and high volume LP on 01/16/2020.    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 [facility]  12/27/2019   accoustic neuroma   CARDIAC CATHETERIZATION  02/20/2009   normal coronary arteries   CYSTOSCOPY WITH STENT PLACEMENT Left 06/05/2021   Procedure: CYSTOSCOPY WITH STENT PLACEMENT;  Surgeon: Hollice Espy, MD;  Location: [facility];  Service:  Urology;  Laterality: Left;   CYSTOSCOPY/URETEROSCOPY/HOLMIUM LASER/STENT PLACEMENT Left 07/01/2021   Procedure: CYSTOSCOPY/URETEROSCOPY/HOLMIUM LASER/STENT EXCHANGE;  Surgeon: Hollice Espy, MD;  Location: [facility];  Service: Urology;  Laterality: Left;   EYE SURGERY     bilateral cataract with lens implant   INCISION AND DRAINAGE PERIRECTAL ABSCESS N/A 01/02/2018   Procedure: IRRIGATION AND DEBRIDEMENT PERIRECTAL ABSCESS;  Surgeon: Coralie Keens, MD;  Location: [place];  Service: General;  Laterality: N/A;   IRRIGATION AND DEBRIDEMENT BUTTOCKS N/A 01/05/2018   Procedure: DEBRIDEMENT BUTTOCKS ABSCESS;  Surgeon: Georganna Skeans, MD;  Location: [place];  Service: General;  Laterality: N/A;   KNEE ARTHROSCOPY  2010   Lt   LEFT HEART CATH AND CORONARY ANGIOGRAPHY N/A 06/10/2021   Procedure: LEFT HEART CATH AND CORONARY ANGIOGRAPHY;  Surgeon: Wellington Hampshire, MD;  Location: [place] [facility];  Service: Cardiovascular;  Laterality: N/A;   NM MYOCAR PERF WALL MOTION  09/28/2007   small area of reversibility in the anterolateral wall at the apex concerning for ischemia   SHOULDER ARTHROSCOPY  10/2010   Rt   TOTAL KNEE ARTHROPLASTY Left 08/11/2019   Procedure: TOTAL KNEE ARTHROPLASTY;  Surgeon: Paralee Cancel, MD;  Location: [facility];  Service: Orthopedics;  Laterality: Left;  70 mins   TOTAL KNEE ARTHROPLASTY Right 09/13/2019   Procedure: TOTAL KNEE ARTHROPLASTY;  Surgeon: Paralee Cancel, MD;  Location: [facility];  Service: Orthopedics;  Laterality: Right;  70 mins   WOUND DEBRIDEMENT N/A 01/04/2018   Procedure: IRRIGATION AND DEBRIDEMENT OF BUTTOCKS AND REMOVAL OF TICK FROM RIGHT TESTICLE;  Surgeon: Georganna Skeans, MD;  Location: [place];  Service: General;  Laterality: N/A;    Current Medications: Current Meds  Medication Sig   acetaminophen (TYLENOL) 325 MG tablet Take 325-650 mg by mouth every 6 (six) hours as needed for headache.   cetirizine (ZYRTEC) 5 MG tablet Take 1 tablet (5 mg total)  by mouth daily.   cyanocobalamin 1000 MCG tablet Take 3,000 mcg by mouth daily.   docusate sodium (COLACE) 100 MG capsule Take 1 capsule (100 mg total) by mouth 2 (two) times daily as needed for mild constipation. (Patient taking differently: Take 100 mg by mouth daily.)   famotidine (PEPCID) 20 MG tablet Take 1 tablet (20 mg total) by mouth 2 (two) times daily.   furosemide (LASIX) 20 MG tablet Take 1 tablet (20 mg total) by mouth daily as needed for edema (for swelling in legs/ankle).   metoprolol succinate (TOPROL XL) 25 MG 24 hr tablet Take 1 tablet (25 mg total) by mouth daily.   triamcinolone cream (KENALOG) 0.1 % Apply 1 Application topically 2 (two) times daily.   [DISCONTINUED] apixaban (ELIQUIS) 5 MG TABS tablet Take 1 tablet (5 mg total) by mouth 2 (two) times daily.   [DISCONTINUED] atorvastatin (LIPITOR) 10 MG tablet Take 1 tablet (10 mg total) by mouth daily.   [DISCONTINUED] losartan (COZAAR) 25 MG tablet  Take 0.5 tablets (12.5 mg total) by mouth daily.     Allergies:   Codeine and Oxycodone   Social History   Socioeconomic History   Marital status: Divorced    Spouse name: Not on file   Number of children: 0   Years of education: some college   Highest education level: Not on file  Occupational History   Occupation: Retired  Tobacco Use   Smoking status: Never   Smokeless tobacco: Never  Vaping Use   Vaping Use: Never used  Substance and Sexual Activity   Alcohol use: No   Drug use: No   Sexual activity: Not on file  Other Topics Concern   Not on file  Social History Narrative   Denies caffeine use    Social Determinants of Health   Financial Resource Strain: Low Risk  (06/27/2022)   Overall Financial Resource Strain (CARDIA)    Difficulty of Paying Living Expenses: Not hard at all  Food Insecurity: [place] Present (06/27/2022)   Hunger Vital Sign    Worried About [place] in the Last Year: Often true    Ran Out of Food in the Last Year:  Often true  Transportation Needs: No Transportation Needs (06/27/2022)   PRAPARE - Hydrologist (Medical): No    Lack of Transportation (Non-Medical): No  Physical Activity: Insufficiently Active (06/27/2022)   Exercise Vital Sign    Days of Exercise per Week: 5 days    Minutes of Exercise per Session: 10 min  Stress: No Stress Concern Present (06/27/2022)   [place]    Feeling of Stress : Not at all  Social Connections: Socially Isolated (06/27/2022)   Social Connection and Isolation Panel [NHANES]    Frequency of Communication with Friends and Family: More than three times a week    Frequency of Social Gatherings with Friends and Family: More than three times a week    Attends Religious Services: Never    Marine scientist or Organizations: No    Attends Music therapist: Never    Marital Status: Divorced     Family History: [name] family history includes Alzheimer's disease in his maternal grandfather, maternal grandmother, and mother; Heart disease in his father; Heart failure in his mother; Hypertension in his mother; Skin cancer in his mother.  ROS:   Please see the history of present illness.    All other systems reviewed and are negative.  EKGs/Labs/Other Studies Reviewed:    The following studies were reviewed today:  Nuclear stress test 12/31/2016 Nuclear stress EF: 62%. There was no ST segment deviation noted during stress. This is a low risk study. The left ventricular ejection fraction is normal (55-65%).   1. EF 62%, normal wall motion.  2. Fixed small, mild mid anteroseptal and apical septal perfusion defect.  No evidence for ischemia.  Given normal wall motion, most likely attenuation.  Cannot rule out prior infarction.    Low risk study.      Echocardiogram 06/07/2021   IMPRESSIONS     1. Challenging images.   2. Left ventricular  ejection fraction, by estimation, is 30 to 35%. The  left ventricle has moderately decreased function. The left ventricle  demonstrates global hypokinesis. Left ventricular diastolic parameters are  indeterminate.   3. Right ventricular systolic function is mildly reduced. The right  ventricular size is mildly enlarged.   4. Left atrial size  was mildly dilated.   5. The mitral valve is normal in structure. No evidence of mitral valve  regurgitation. No evidence of mitral stenosis.   6. The aortic valve was not well visualized. Aortic valve regurgitation  is not visualized. No aortic stenosis is present.     EKG:  EKG is  ordered today.  The ekg ordered today demonstrates AF, HR 103 bpm.   Recent Labs: 10/02/2022: ALT 15; BUN 18; Creatinine, Ser 1.06; Hemoglobin 13.1; Platelets 239; Potassium 4.8; Sodium 140  Recent Lipid Panel    Component Value Date/Time   CHOL 111 03/24/2022 1540   TRIG 42 03/24/2022 1540   HDL 53 03/24/2022 1540   CHOLHDL 2.1 03/24/2022 1540   LDLCALC 47 03/24/2022 1540     Risk Assessment/Calculations:    CHA2DS2-VASc Score = 3   This indicates a 3.2% annual risk of stroke. [name] score is based upon: CHF History: 1 HTN History: 1 Diabetes History: 0 Stroke History: 0 Vascular Disease History: 0 Age Score: 1 Gender Score: 0           Physical Exam:    VS:  BP (!) 152/84 (BP Location: Left Arm, Patient Position: Sitting, Cuff Size: Large)   Pulse (!) 102   Ht 6\' 1"  (1.854 m)   Wt 263 lb (119.3 kg)   BMI 34.70 kg/m     Wt Readings from Last 3 Encounters:  10/14/22 263 lb (119.3 kg)  10/02/22 266 lb (120.7 kg)  05/30/22 257 lb 3.2 oz (116.7 kg)     GEN:  Well nourished, well developed in no acute distress HEENT: Normal NECK: No JVD; No carotid bruits LYMPHATICS: No lymphadenopathy CARDIAC: irregular rate and rhythm, no murmurs, rubs, gallops RESPIRATORY:  Clear to auscultation without rales, wheezing or rhonchi  ABDOMEN: Soft,  non-tender, non-distended MUSCULOSKELETAL:  No edema; No deformity  SKIN: Warm and dry NEUROLOGIC:  Alert and oriented x 3 PSYCHIATRIC:  Normal affect   ASSESSMENT:    1. Permanent atrial fibrillation ([place])   2. Chronic diastolic CHF (congestive heart failure) ([place])   3. Mixed hyperlipidemia   4. Essential hypertension   5. Fatigue, unspecified type    PLAN:    In order of problems listed above:  Atrial fibrillation -atrial fibrillation noted on EKG today, heart rate uncontrolled at 103 bpm.  He has been out of his metoprolol for some time, likely greater than several months.  He has not taken his Eliquis for several days as he recently ran out.  He prefers a very streamlined medication regimen. Recent CBC and creatinine were within normal range on 10/02/2022 checked by his PCP.  Will refill Eliquis, continue 5 mg twice a day.  Will restart Toprol 25 mg daily.  Chronic diastolic heart failure -NYHA class I, euvolemic.  He has not needed to take his as needed Lasix for pedal edema.  He has been out of his Cozaar for several days.  We will refill this today for him.  Continue Cozaar, continue metoprolol.  He is not willing to discuss any further GDMT at this time.   HLD -LDL on 03/25/2022 was well-controlled at 47, managed by his PCP.  Hypertension -blood pressure today is elevated at 152/84, he has been out of his losartan for 3 days.  Will send in refills today.   Fatigue -this has been persistent for him for the last 6 weeks.  He notices that he frequently nods off during the day.  He has not been wearing  his CPAP for some time, encouraged him to restart that today.  Recent CBC and BMET on 10/02/2022 were unrevealing for causes of fatigue.  Will check TSH today.  Disposition - check TSH, return in 3 months.             Medication Adjustments/Labs and Tests Ordered: Current medicines are reviewed at length with the patient today.  Concerns regarding medicines are outlined above.   Orders Placed This Encounter  Procedures   TSH   EKG 12-Lead   Meds ordered this encounter  Medications   apixaban (ELIQUIS) 5 MG TABS tablet    Sig: Take 1 tablet (5 mg total) by mouth 2 (two) times daily.    Dispense:  180 tablet    Refill:  1   metoprolol succinate (TOPROL XL) 25 MG 24 hr tablet    Sig: Take 1 tablet (25 mg total) by mouth daily.    Dispense:  30 tablet    Refill:  6   atorvastatin (LIPITOR) 10 MG tablet    Sig: Take 1 tablet (10 mg total) by mouth daily.    Dispense:  90 tablet    Refill:  1   losartan (COZAAR) 25 MG tablet    Sig: Take 0.5 tablets (12.5 mg total) by mouth daily.    Dispense:  30 tablet    Refill:  3    Patient Instructions  Medication Instructions:  START METOPROLOL 25MG  DAILY  *If you need a refill on your cardiac medications before your next appointment, please call your pharmacy*  Lab Work: TSH TODAY If you have labs (blood work) drawn today and your tests are completely normal, you will receive your results only by:  [place] (if you have MyChart) OR  A paper copy in the mail  If you have any lab test that is abnormal or we need to change your treatment, we will call you to review the results.  Testing/Procedures: NONE  Follow-Up: At [facility], you and your health needs are our priority.  As part of our continuing mission to provide you with exceptional heart care, we have created designated Provider Care Teams.  These Care Teams include your primary Cardiologist (physician) and Advanced Practice Providers (APPs -  Physician Assistants and Nurse Practitioners) who all work together to provide you with the care you need, when you need it.  Your next appointment:   6 month(s)  Provider:   Sanda Klein, MD     Other Instructions     Signed, Trudi Ida, NP  10/14/2022 12:03 PM    [place]

## 2022-10-14 ENCOUNTER — Ambulatory Visit: Payer: Medicare HMO | Attending: General Practice | Admitting: Cardiology

## 2022-10-14 ENCOUNTER — Telehealth: Payer: Self-pay | Admitting: Internal Medicine

## 2022-10-14 VITALS — BP 152/84 | HR 102 | Ht 73.0 in | Wt 263.0 lb

## 2022-10-14 DIAGNOSIS — I480 Paroxysmal atrial fibrillation: Secondary | ICD-10-CM | POA: Diagnosis not present

## 2022-10-14 DIAGNOSIS — I5032 Chronic diastolic (congestive) heart failure: Secondary | ICD-10-CM

## 2022-10-14 DIAGNOSIS — I10 Essential (primary) hypertension: Secondary | ICD-10-CM | POA: Diagnosis not present

## 2022-10-14 DIAGNOSIS — I4821 Permanent atrial fibrillation: Secondary | ICD-10-CM | POA: Diagnosis not present

## 2022-10-14 DIAGNOSIS — I4819 Other persistent atrial fibrillation: Secondary | ICD-10-CM | POA: Diagnosis not present

## 2022-10-14 DIAGNOSIS — E782 Mixed hyperlipidemia: Secondary | ICD-10-CM

## 2022-10-14 DIAGNOSIS — R5383 Other fatigue: Secondary | ICD-10-CM

## 2022-10-14 MED ORDER — LOSARTAN POTASSIUM 25 MG PO TABS: 12.5000 mg | ORAL_TABLET | Freq: Every day | ORAL | 3 refills | Status: DC

## 2022-10-14 MED ORDER — METOPROLOL SUCCINATE ER 25 MG PO TB24: 25.0000 mg | ORAL_TABLET | Freq: Every day | ORAL | 6 refills | Status: DC

## 2022-10-14 MED ORDER — ATORVASTATIN CALCIUM 10 MG PO TABS: 10.0000 mg | ORAL_TABLET | Freq: Every day | ORAL | 1 refills | Status: DC

## 2022-10-14 MED ORDER — APIXABAN 5 MG PO TABS: 5.0000 mg | ORAL_TABLET | Freq: Two times a day (BID) | ORAL | 1 refills | Status: DC

## 2022-10-14 NOTE — Telephone Encounter (Signed)
I spoke to [employer] about the peer to peer request due to patient's weight. She explained that based on the PT seating eval ,they ordered the [id number] power chair.   However, the weight requirement for this chair is 301-450 lbs and the patient's weight is 266 lbs.  She went on to explain that the patient was 400 lbs and has lost a great deal of weight but his midsection is still large and both she and the PT agreed that he needs the larger chair to accommodate his body habitus.   Lorriane Shire then said that the [phone] power wheelchair can be ordered if this chair [phone] is denied.  The weight parameters for the K0823 is 0-300 lbs.  Lorriane Shire said that she has orders for both power wheelchairs and will re-submit the order for the [phone] if the current order is denied after the peer to peer review.   I tried returning the call to [place] [employer] : [phone] ext [phone] and the message stated that extension was not recognized.  I then spoke to Beth/ [employer] who explained that because of the discrepancy with the weight requirements for the requested chair they have submitted the order to their Medical Director for review.  She said to disregard the 10/13/2022 due date for peer to peer.  She explained that Dr Wynetta Emery does not have to do anything at this time.  We do not need to schedule the peer to peer.  The order is still being reviewed.  She said that the Medical Director will contact Dr Wynetta Emery with the date/time of the peer to peer.  The case # [id number].

## 2022-10-14 NOTE — Telephone Encounter (Signed)
Addressed in another note from today

## 2022-10-14 NOTE — Patient Instructions (Signed)
Medication Instructions:  START METOPROLOL 25MG  DAILY  *If you need a refill on your cardiac medications before your next appointment, please call your pharmacy*  Lab Work: TSH TODAY If you have labs (blood work) drawn today and your tests are completely normal, you will receive your results only by:  [place] (if you have MyChart) OR  A paper copy in the mail  If you have any lab test that is abnormal or we need to change your treatment, we will call you to review the results.  Testing/Procedures: NONE  Follow-Up: At [facility], you and your health needs are our priority.  As part of our continuing mission to provide you with exceptional heart care, we have created designated Provider Care Teams.  These Care Teams include your primary Cardiologist (physician) and Advanced Practice Providers (APPs -  Physician Assistants and Nurse Practitioners) who all work together to provide you with the care you need, when you need it.  Your next appointment:   6 month(s)  Provider:   Sanda Klein, MD     Other Instructions

## 2022-10-15 ENCOUNTER — Telehealth: Payer: Self-pay | Admitting: Emergency Medicine

## 2022-10-15 ENCOUNTER — Telehealth: Payer: Self-pay

## 2022-10-15 LAB — TSH: TSH: 2.08 u[IU]/mL (ref 0.450–4.500)

## 2022-10-15 NOTE — Telephone Encounter (Signed)
PC turn to Dr. Parke Simmers at the phone number listed at [phone].  I left a message informing him of who I am and that he can call me back on my cell phone to do the peer to peer.

## 2022-10-15 NOTE — Telephone Encounter (Signed)
-----   Message from Trudi Ida, NP sent at 10/15/2022  9:09 AM EDT ----- Mr. Doswell, Your thyroid was normal, so not causing your fatigue. Likely, it is related to not using your CPAP like we discussed. Remember to take your medications we discussed daily! Best, Anderson [place]

## 2022-10-15 NOTE — Telephone Encounter (Signed)
Copied from [place] [id number]. Topic: General - Other >> Oct 15, 2022  8:57 AM [place] B wrote: Reason for CRM: Dr Parke Simmers from [facility] , called about doing a peer to peer with Dr Wynetta Emery for power wheelchair for patient. He says he will only be available today and tomorrow until 2

## 2022-10-15 NOTE — Telephone Encounter (Signed)
Left voicemail for patient to return call to office. 

## 2022-10-16 NOTE — Telephone Encounter (Addendum)
PC placed to Dr. Parke Simmers this a.m using the phone # provided by the front desk as [phone].  Male answered and said this is the wrong #. I then called [employer] RN line and spoke with [place].  I informed her about the call received by my office yesterday from Dr. Parke Simmers and that I did try to call him back.  However the number that I have, the person who answered said it is the wrong number.  She stated that she will submit a request to the medical director so that I received a call back.  I gave her my cell phone number.  She told me that the case number to reference is [id number].  I told her that I will await the call from the medical director.  Addendum 10/17/2022: I did receive a call back from the medical director Dr. Parke Simmers yesterday.  He stated that the request was denied because request was put in for a larger mobility chair than is needed for patient's weight.  I explained to him that the patient had weight reduction surgery several years ago and had lost a lot of weight.  However he still has increased abdominal girth.  Physical therapy and the medical supply carrier who did his eval both felt that the large chair was needed due to his abdominal girth.  He expressed understanding and agreed to approve the patient for the larger mobility chair.

## 2022-10-28 DIAGNOSIS — M1990 Unspecified osteoarthritis, unspecified site: Secondary | ICD-10-CM | POA: Diagnosis not present

## 2022-11-11 ENCOUNTER — Telehealth: Payer: Self-pay

## 2022-11-11 NOTE — Telephone Encounter (Signed)
I spoke to [employer] and she confirmed that the patient received his power chair around the beginning of the month

## 2022-11-26 DIAGNOSIS — M1990 Unspecified osteoarthritis, unspecified site: Secondary | ICD-10-CM | POA: Diagnosis not present

## 2022-12-27 DIAGNOSIS — M1990 Unspecified osteoarthritis, unspecified site: Secondary | ICD-10-CM | POA: Diagnosis not present

## 2023-01-14 ENCOUNTER — Emergency Department: Payer: Medicare HMO

## 2023-01-14 ENCOUNTER — Emergency Department
Admission: EM | Admit: 2023-01-14 | Discharge: 2023-01-14 | Disposition: A | Discharge status: Home / Self Care | Payer: Medicare HMO | Attending: Emergency Medicine | Admitting: Emergency Medicine

## 2023-01-14 ENCOUNTER — Other Ambulatory Visit: Payer: Self-pay

## 2023-01-14 DIAGNOSIS — J811 Chronic pulmonary edema: Secondary | ICD-10-CM | POA: Diagnosis not present

## 2023-01-14 DIAGNOSIS — R600 Localized edema: Secondary | ICD-10-CM | POA: Diagnosis not present

## 2023-01-14 DIAGNOSIS — Z7901 Long term (current) use of anticoagulants: Secondary | ICD-10-CM | POA: Diagnosis not present

## 2023-01-14 DIAGNOSIS — I2510 Atherosclerotic heart disease of native coronary artery without angina pectoris: Secondary | ICD-10-CM | POA: Diagnosis not present

## 2023-01-14 DIAGNOSIS — S0990XA Unspecified injury of head, initial encounter: Secondary | ICD-10-CM | POA: Diagnosis not present

## 2023-01-14 DIAGNOSIS — I10 Essential (primary) hypertension: Secondary | ICD-10-CM | POA: Diagnosis not present

## 2023-01-14 DIAGNOSIS — R609 Edema, unspecified: Secondary | ICD-10-CM | POA: Diagnosis not present

## 2023-01-14 DIAGNOSIS — W19XXXA Unspecified fall, initial encounter: Secondary | ICD-10-CM | POA: Diagnosis not present

## 2023-01-14 DIAGNOSIS — R531 Weakness: Secondary | ICD-10-CM | POA: Diagnosis not present

## 2023-01-14 DIAGNOSIS — R5383 Other fatigue: Secondary | ICD-10-CM | POA: Diagnosis not present

## 2023-01-14 LAB — CBC WITH DIFFERENTIAL/PLATELET
Abs Immature Granulocytes: 0.01 10*3/uL (ref 0.00–0.07)
Basophils Absolute: 0 10*3/uL (ref 0.0–0.1)
Basophils Relative: 0 %
Eosinophils Absolute: 0.1 10*3/uL (ref 0.0–0.5)
Eosinophils Relative: 1 %
HCT: 38.7 % — ABNORMAL LOW (ref 39.0–52.0)
Hemoglobin: 12.2 g/dL — ABNORMAL LOW (ref 13.0–17.0)
Immature Granulocytes: 0 %
Lymphocytes Relative: 13 %
Lymphs Abs: 0.8 10*3/uL (ref 0.7–4.0)
MCH: 26.7 pg (ref 26.0–34.0)
MCHC: 31.5 g/dL (ref 30.0–36.0)
MCV: 84.7 fL (ref 80.0–100.0)
Monocytes Absolute: 0.4 10*3/uL (ref 0.1–1.0)
Monocytes Relative: 7 %
Neutro Abs: 5.1 10*3/uL (ref 1.7–7.7)
Neutrophils Relative %: 79 %
Platelets: 202 10*3/uL (ref 150–400)
RBC: 4.57 MIL/uL (ref 4.22–5.81)
RDW: 15.6 % — ABNORMAL HIGH (ref 11.5–15.5)
WBC: 6.5 10*3/uL (ref 4.0–10.5)
nRBC: 0 % (ref 0.0–0.2)

## 2023-01-14 LAB — BASIC METABOLIC PANEL
Anion gap: 7 (ref 5–15)
BUN: 18 mg/dL (ref 8–23)
CO2: 25 mmol/L (ref 22–32)
Calcium: 8.3 mg/dL — ABNORMAL LOW (ref 8.9–10.3)
Chloride: 108 mmol/L (ref 98–111)
Creatinine, Ser: 0.93 mg/dL (ref 0.61–1.24)
GFR, Estimated: >60 mL/min (ref >60)
Glucose, Bld: 81 mg/dL (ref 70–99)
Potassium: 3.6 mmol/L (ref 3.5–5.1)
Sodium: 140 mmol/L (ref 135–145)

## 2023-01-14 LAB — URINALYSIS, W/ REFLEX TO CULTURE (INFECTION SUSPECTED)
Bacteria, UA: NONE SEEN
Bilirubin Urine: NEGATIVE
Glucose, UA: NEGATIVE mg/dL
Hgb urine dipstick: NEGATIVE
Ketones, ur: NEGATIVE mg/dL
Leukocytes,Ua: NEGATIVE
Nitrite: NEGATIVE
Protein, ur: NEGATIVE mg/dL
Specific Gravity, Urine: 1.008 (ref 1.005–1.030)
WBC, UA: NONE SEEN WBC/hpf (ref 0–5)
pH: 7 (ref 5.0–8.0)

## 2023-01-14 MED ORDER — APIXABAN 5 MG PO TABS
5.0000 mg | ORAL_TABLET | Freq: Two times a day (BID) | ORAL | Status: DC
  Administered 2023-01-14: 5 mg via ORAL
  Filled 2023-01-14: qty 1

## 2023-01-14 MED ORDER — FUROSEMIDE 10 MG/ML IJ SOLN
40.0000 mg | Freq: Once | INTRAMUSCULAR | Status: AC
Start: 2023-01-14 — End: 2023-01-14
  Administered 2023-01-14: 40 mg via INTRAVENOUS
  Filled 2023-01-14: qty 4

## 2023-01-14 NOTE — ED Triage Notes (Signed)
Pt BIB EMS for pt feeling more fatigued, BLE edema--pt states that he went to the beach, forgot all of his medications and went without them for a week.  He is in a fib normally but well controlled.

## 2023-01-14 NOTE — Discharge Instructions (Addendum)
Continue to take all your medications as prescribed by your doctor.  Have a follow-up with your primary doctor this week.  If you have any new, worsening, or unexpected symptoms call your doctor right away or come back to the emergency department.

## 2023-01-14 NOTE — ED Provider Notes (Signed)
[facility] Provider Note    Event Date/Time   First MD Initiated Contact with Patient 01/14/23 [phone]     (approximate)   History   medication non-compliance   HPI  Brandon Marks is a 67 y.o. male   Past medical history of CAD, atrial fibrillation on Eliquis, sleep apnea on CPAP, vestibular schwannoma with CSF leak, hydrocephalus with VP shunt, who presents to the emergency department with swelling in his legs, sliding off of his recliner this morning, noncompliance with medications for the past 4 days due to a vacation where he forgot his medications.  He went to [facility] for the past 4 days and did not take any of his medications with him.  He has all of his medications at home with him now, does not need a refill, and has full intention to resuming his medications.   He returned home today and trying to stand from his recliner he felt his legs were heavy and more swollen than usual and slid downwards.  No significant trauma, unknown head strike head strike.  He does not report any pain or injuries from the fall.  He was encouraged to come to the emergency department by his visiting nurse.   He continues to feel fatigued but otherwise has had no pain, or other recent acute illnesses and specifically denies chest pain, shortness of breath, cough, fever, chills, GI or GU complaints.   External Medical Documents Reviewed: Cardiology note from March 2024 documenting his past medical history, medications, and chief complaint of fatigue.  Note at that time documenting noncompliance with medications     Physical Exam   Triage Vital Signs: ED Triage Vitals  Enc Vitals Group     BP 01/14/23 0645 (!) 143/102     Pulse Rate 01/14/23 0645 73     Resp 01/14/23 0645 16     Temp 01/14/23 0700 98.2 F (36.8 C)     Temp Source 01/14/23 0700 Oral     SpO2 01/14/23 0645 100 %     Weight --      Height --      Head Circumference --      Peak Flow --      Pain  Score 01/14/23 0704 3     Pain Loc --      Pain Edu? --      Excl. in GC? --     Most recent vital signs: Vitals:   01/14/23 0700 01/14/23 0700  BP: (!) 137/103   Pulse: 75   Resp: 17   Temp:  98.2 F (36.8 C)  SpO2: 100%     General: Awake, no distress.  CV:  Good peripheral perfusion.  Resp:  Normal effort. Abd:  No distention.  Other:  Laying in the stretcher comfortable nontoxic-appearing speaking in full sentences no respiratory distress.  He has clear lungs to auscultation of the soft nontender abdomen and he is moving all extremities with full active range of motion, neck supple with full range of motion no obvious signs of trauma.  He does have bilateral pitting edema to the mid calves   ED Results / Procedures / Treatments   Labs (all labs ordered are listed, but only abnormal results are displayed) Labs Reviewed  BASIC METABOLIC PANEL - Abnormal; Notable for the following components:      Result Value   Calcium 8.3 (*)    All other components within normal limits  CBC WITH DIFFERENTIAL/PLATELET - Abnormal;  Notable for the following components:   Hemoglobin 12.2 (*)    HCT 38.7 (*)    RDW 15.6 (*)    All other components within normal limits     I ordered and reviewed the above labs they are notable for glucose and white blood cell count are within normal limits  EKG  ED ECG REPORT I, Pilar Jarvis, the attending physician, personally viewed and interpreted this ECG.   Date: 01/14/2023  EKG Time: 0657  Rate: 95  Rhythm: AF  Axis: nl  Intervals: occasional PVC  ST&T Change: no stemi    RADIOLOGY I independently reviewed and interpreted CT of the head see no obvious bleeding or midline shift   PROCEDURES:  Critical Care performed: No  Procedures   MEDICATIONS ORDERED IN ED: Medications  furosemide (LASIX) injection 40 mg (has no administration in time range)   IMPRESSION / MDM / ASSESSMENT AND PLAN / ED COURSE  I reviewed the triage vital  signs and the nursing notes.                                Patient's presentation is most consistent with acute presentation with potential threat to life or bodily function.  Differential diagnosis includes, but is not limited to, CHF exacerbation, dysrhythmia, infection, stroke, blunt traumatic injury including head strike causing intracranial bleeding   The patient is on the cardiac monitor to evaluate for evidence of arrhythmia and/or significant heart rate changes.  MDM:   Chief complaint of chronic unchanged fatigue as well as leg heaviness in the setting of no diuresis over the last 4 days, leg swelling, sliding out of his recliner today.  He does look like he has some peripheral edema so I ordered him for an IV dose of Lasix.  Will check basic labs.  Considered ACS, PE, but unlikely given no chest pain or shortness of breath.  Considered stroke but doubt due to no focal deficits on my exam.  Since the patient feels otherwise well no other complaints aside from peripheral edema, appears atraumatic, after checking basic labs and diuresing, checking CT head given unknown head strike with a fall on blood thinners, if all workup unremarkable plan will be for discharge and close PMD follow-up.         FINAL CLINICAL IMPRESSION(S) / ED DIAGNOSES   Final diagnoses:  Fall, initial encounter  Peripheral edema     Rx / DC Orders   ED Discharge Orders     None        Note:  This document was prepared using Dragon voice recognition software and may include unintentional dictation errors.    Pilar Jarvis, MD 01/14/23 1515

## 2023-01-20 ENCOUNTER — Emergency Department: Payer: Medicare HMO

## 2023-01-20 ENCOUNTER — Other Ambulatory Visit: Payer: Self-pay

## 2023-01-20 ENCOUNTER — Encounter: Payer: Self-pay | Admitting: Emergency Medicine

## 2023-01-20 ENCOUNTER — Ambulatory Visit: Payer: Self-pay

## 2023-01-20 DIAGNOSIS — Z7901 Long term (current) use of anticoagulants: Secondary | ICD-10-CM | POA: Insufficient documentation

## 2023-01-20 DIAGNOSIS — N390 Urinary tract infection, site not specified: Secondary | ICD-10-CM | POA: Diagnosis not present

## 2023-01-20 DIAGNOSIS — R8271 Bacteriuria: Secondary | ICD-10-CM | POA: Insufficient documentation

## 2023-01-20 DIAGNOSIS — I10 Essential (primary) hypertension: Secondary | ICD-10-CM | POA: Diagnosis not present

## 2023-01-20 DIAGNOSIS — E860 Dehydration: Secondary | ICD-10-CM | POA: Diagnosis not present

## 2023-01-20 DIAGNOSIS — I2510 Atherosclerotic heart disease of native coronary artery without angina pectoris: Secondary | ICD-10-CM | POA: Insufficient documentation

## 2023-01-20 DIAGNOSIS — R531 Weakness: Secondary | ICD-10-CM | POA: Diagnosis not present

## 2023-01-20 LAB — CBC
HCT: 39.2 % (ref 39.0–52.0)
Hemoglobin: 12.6 g/dL — ABNORMAL LOW (ref 13.0–17.0)
MCH: 26.9 pg (ref 26.0–34.0)
MCHC: 32.1 g/dL (ref 30.0–36.0)
MCV: 83.6 fL (ref 80.0–100.0)
Platelets: 255 10*3/uL (ref 150–400)
RBC: 4.69 MIL/uL (ref 4.22–5.81)
RDW: 15.7 % — ABNORMAL HIGH (ref 11.5–15.5)
WBC: 7.8 10*3/uL (ref 4.0–10.5)
nRBC: 0 % (ref 0.0–0.2)

## 2023-01-20 LAB — BASIC METABOLIC PANEL
Anion gap: 6 (ref 5–15)
BUN: 22 mg/dL (ref 8–23)
CO2: 23 mmol/L (ref 22–32)
Calcium: 8.5 mg/dL — ABNORMAL LOW (ref 8.9–10.3)
Chloride: 106 mmol/L (ref 98–111)
Creatinine, Ser: 1.19 mg/dL (ref 0.61–1.24)
GFR, Estimated: >60 mL/min (ref >60)
Glucose, Bld: 78 mg/dL (ref 70–99)
Potassium: 3.9 mmol/L (ref 3.5–5.1)
Sodium: 135 mmol/L (ref 135–145)

## 2023-01-20 LAB — TROPONIN I (HIGH SENSITIVITY): Troponin I (High Sensitivity): 3 ng/L (ref <18)

## 2023-01-20 NOTE — ED Triage Notes (Signed)
Pt presents ambulatory to triage via POV with complaints of chronic weakness. Pt states he was seen here last week for the same and he was "run [place] here and things haven't gotten any better." Pt denies any acute changes but wanted to be re-evaluated for his weakness.  A&Ox4 at this time. Denies syncope, falls, LOC, N/V/D, abdominal pain, CP or SOB.

## 2023-01-20 NOTE — Telephone Encounter (Signed)
  Chief Complaint: Weakness - dark urine/painful urination Symptoms: above Frequency: 01/14/2023 Pertinent Negatives: Patient denies  Disposition: [x] ED /[] Urgent Care (no appt availability in office) / [] Appointment(In office/virtual)/ []  [place] [facility]/ [] Home Care/ [] Refused Recommended Disposition /[] [place] [facility]/ []  Follow-up with PCP Additional Notes: Pt was seen in ED for these same S/s without resolution.  Pt still feels very weak. Pt will return to ED in the morning for follow up. Pt will continue to monitor s/s and will seek immediate care if needed.  No office appts available.  Reason for Disposition  Patient sounds very sick or weak to the triager  Answer Assessment - Initial Assessment Questions 1. SYMPTOM: "What's the main symptom you're concerned about?" (e.g., frequency, incontinence)     Pain/ dark urine/weakness 2. ONSET: "When did the    start?"     1 week 3. PAIN: "Is there any pain?" If Yes, ask: "How bad is it?" (Scale: 1-10; mild, moderate, severe)     yes 4. CAUSE: "What do you think is causing the symptoms?"     UTI 5. OTHER SYMPTOMS: "Do you have any other symptoms?" (e.g., blood in urine, fever, flank pain, pain with urination)     Weakness  Answer Assessment - Initial Assessment Questions 1. DESCRIPTION: "Describe how you are feeling."     Weakness - like about to fall down 2. SEVERITY: "How bad is it?"  "Can you stand and walk?"   - MILD (0-3): Feels weak or tired, but does not interfere with work, school or normal activities.   - MODERATE (4-7): Able to stand and walk; weakness interferes with work, school, or normal activities.   - SEVERE (8-10): Unable to stand or walk; unable to do usual activities.     Moderate 3. ONSET: "When did these symptoms begin?" (e.g., hours, days, weeks, months)     Before 6/19/ 4. CAUSE: "What do you think is causing the weakness or fatigue?" (e.g., not drinking enough fluids, medical problem, trouble  sleeping)     Unsure 6. OTHER SYMPTOMS: "Do you have any other symptoms?" (e.g., chest pain, fever, cough, SOB, vomiting, diarrhea, bleeding, other areas of pain)     Painful urination/ dark urine  Protocols used: Urinary Symptoms-A-AH, Weakness (Generalized) and Fatigue-A-AH

## 2023-01-20 NOTE — Telephone Encounter (Signed)
Summary: urinary discomfort  / rx req   The patient has experienced urinary discomfort and concerns for roughly 1 week  The patient has noticed discoloration as well as an odor to their urine  The patient would like to speak with a member of clinical staff about their concerns further when possible  The patient would like to be prescribed something for their discomfort  Please contact when available      Unable to LM  - mailbox is full.

## 2023-01-21 ENCOUNTER — Emergency Department
Admission: EM | Admit: 2023-01-21 | Discharge: 2023-01-21 | Disposition: A | Discharge status: Home / Self Care | Payer: Medicare HMO | Attending: Emergency Medicine | Admitting: Emergency Medicine

## 2023-01-21 DIAGNOSIS — R8271 Bacteriuria: Secondary | ICD-10-CM

## 2023-01-21 DIAGNOSIS — R531 Weakness: Secondary | ICD-10-CM

## 2023-01-21 DIAGNOSIS — E860 Dehydration: Secondary | ICD-10-CM

## 2023-01-21 LAB — URINALYSIS, ROUTINE W REFLEX MICROSCOPIC
Bilirubin Urine: NEGATIVE
Glucose, UA: NEGATIVE mg/dL
Hgb urine dipstick: NEGATIVE
Ketones, ur: 5 mg/dL — AB
Leukocytes,Ua: NEGATIVE
Nitrite: NEGATIVE
Protein, ur: 30 mg/dL — AB
Specific Gravity, Urine: 1.027 (ref 1.005–1.030)
pH: 5 (ref 5.0–8.0)

## 2023-01-21 LAB — TROPONIN I (HIGH SENSITIVITY): Troponin I (High Sensitivity): 4 ng/L (ref <18)

## 2023-01-21 MED ORDER — SODIUM CHLORIDE 0.9 % IV SOLN
1.0000 g | INTRAVENOUS | Status: AC
  Administered 2023-01-21: 1 g via INTRAVENOUS
  Filled 2023-01-21: qty 10

## 2023-01-21 MED ORDER — SODIUM CHLORIDE 0.9 % IV BOLUS
500.0000 mL | Freq: Once | INTRAVENOUS | Status: AC
  Administered 2023-01-21: 500 mL via INTRAVENOUS

## 2023-01-21 MED ORDER — CEPHALEXIN 500 MG PO CAPS: 500.0000 mg | ORAL_CAPSULE | Freq: Two times a day (BID) | ORAL | 0 refills | Status: DC

## 2023-01-21 NOTE — ED Notes (Signed)
Pt wheeled back out to the lobby and given a warm blanket. Pt has no further needs at this time.

## 2023-01-21 NOTE — ED Provider Notes (Signed)
I was asked to reassess the patient.  He has received antibiotic and fluids.  He feels better.  He is eager to go home to rest.  He has been stable.  Plan for discharge.   Pilar Jarvis, MD 01/21/23 [phone]

## 2023-01-21 NOTE — ED Notes (Signed)
The pt was wheeled to the waiting room. I made first nurse Morrie Sheldon aware the pt did not yet have a ride home and will need to call a ride. I did provide the pt with phone numbers to contact a ride. Morrie Sheldon was also made aware the pt may need assistance to the bathroom.

## 2023-01-21 NOTE — ED Notes (Signed)
Patient Alert up to go to bathroom. Urinal given to patient. Sitting on sat of bed and bedside table at bed.Encourage patient not to get up with out help.

## 2023-01-21 NOTE — ED Provider Notes (Addendum)
[facility] Provider Note    Event Date/Time   First MD Initiated Contact with Patient 01/21/23 0510     (approximate)   History   Weakness   HPI  Brandon Marks is a 67 y.o. male with a history of atrial fibrillation on Eliquis, coronary artery disease, schwannoma hydrocephalus with VP shunt  Patient was seen about 1 week ago after a fall with weakness after returning from a trip.  He reports that he has continued to be at his house, still up and about ambulatory but just feeling very fatigued and weak.  He is also noticed that his urine has a foul odor and seems he Ze, he reports he had a urinary tract infection about a year ago that seems similar  No chest pain or shortness of breath.  He reports chronic swelling in both feet.  No fevers.  No abdominal pain.  He just reports feeling fatigued, continues to feel tired.  No weakness in any 1 arm or leg no headaches  Patient reports he thinks he has a urinary tract infection.  The penis and scrotum have appeared normal   Reports he has been eating fairly well but has feeling a little bit dehydrated not taking an a lot of fluid in the last few days.  Physical Exam   Triage Vital Signs: ED Triage Vitals  Enc Vitals Group     BP 01/20/23 2153 125/80     Pulse Rate 01/20/23 2153 100     Resp 01/20/23 2153 20     Temp 01/20/23 2153 98.7 F (37.1 C)     Temp Source 01/20/23 2153 Oral     SpO2 01/20/23 2153 100 %     Weight 01/20/23 2151 255 lb (115.7 kg)     Height 01/20/23 2151 6\' 1"  (1.854 m)     Head Circumference --      Peak Flow --      Pain Score --      Pain Loc --      Pain Edu? --      Excl. in GC? --     Most recent vital signs: Vitals:   01/21/23 0120 01/21/23 0520  BP: (!) 123/93 134/88  Pulse: 94 86  Resp: 20 16  Temp: 98 F (36.7 C)   SpO2: 100% 100%     General: Awake, no distress. Pleasant in no distress.  Conversant well-oriented CV:  Good peripheral perfusion.   Slightly irregular but normal rate.  No murmur Resp:  Normal effort.  Clear bilaterally through all fields with normal work of breathing Abd:  No distention.  Somewhat protuberant/obese but no distention or tenderness.  Denies pain to palpation any quadrant Other:  Normal circumcised penis and scrotum.   ED Results / Procedures / Treatments   Labs (all labs ordered are listed, but only abnormal results are displayed) Labs Reviewed  BASIC METABOLIC PANEL - Abnormal; Notable for the following components:      Result Value   Calcium 8.5 (*)    All other components within normal limits  CBC - Abnormal; Notable for the following components:   Hemoglobin 12.6 (*)    RDW 15.7 (*)    All other components within normal limits  URINALYSIS, ROUTINE W REFLEX MICROSCOPIC - Abnormal; Notable for the following components:   Color, Urine YELLOW (*)    APPearance HAZY (*)    Ketones, ur 5 (*)    Protein, ur 30 (*)  Bacteria, UA RARE (*)    All other components within normal limits  URINE CULTURE  TROPONIN I (HIGH SENSITIVITY)  TROPONIN I (HIGH SENSITIVITY)     EKG  EKG interpreted by me at approximately 3 AM heart rate 110 QRS 100 QTc 450 Atrial fibrillation no evidence of acute ischemia.   RADIOLOGY  Portable chest x-ray imaging reviewed inter by me as negative for acute finding   PROCEDURES:  Critical Care performed: No  Procedures   MEDICATIONS ORDERED IN ED: Medications  sodium chloride 0.9 % bolus 500 mL (500 mLs Intravenous New Bag/Given 01/21/23 0538)  cefTRIAXone (ROCEPHIN) 1 g in sodium chloride 0.9 % 100 mL IVPB (1 g Intravenous New Bag/Given 01/21/23 0535)     IMPRESSION / MDM / ASSESSMENT AND PLAN / ED COURSE  I reviewed the triage vital signs and the nursing notes.                              Differential diagnosis includes, but is not limited to, generalized fatigue, malaise, urinary tract infection dehydration, AKI, metabolic abnormality, arrhythmia, etc.   Differential diagnosis for generalized weakness is quite broad but he denies any central neurologic symptoms.  On exam he moves all extremities well his facial expressions are normal does not show any evidence of a central neurologic abnormality.  He does report however that he has hazy and somewhat malodorous urine for the last several days and reports a history similar with the previous urinary tract infection.  Reviewed previous urine culture from November of last year, sensitive to cephalosporin then   Discussed with patient, plan to hydrate and initiate antibiotic.  Urinalysis shows bacteria, no leukocytes or nitrites though.  But given his associated symptoms of hazy abnormal urine odor... Lack of pyuria, but this could be seen with Klebsiella  His ECG tracing and CBC with only very mild anemia and no elevated white count and normal metabolic panel with exception of minimally reduced calcium as well as 2 normal troponins and lack of chest pain are quite reassuring.  Patient's presentation is most consistent with acute complicated illness / injury requiring diagnostic workup.   ----------------------------------------- 7:30 AM on 01/21/2023 ----------------------------------------- Ongoing care assigned to Dr. Modesto Charon.  Plan for reevaluation after fluids.  Thereafter suspect likely be able to discharge the patient to home with treatment with antibiotic for suspected urinary tract infection and close outpatient follow-up.  Suspect because of generalized weakness likely mild dehydration and possible concomitant UTI       FINAL CLINICAL IMPRESSION(S) / ED DIAGNOSES   Final diagnoses:  Bacteria in urine  Dehydration  Generalized weakness     Rx / DC Orders   ED Discharge Orders          Ordered    cephALEXin (KEFLEX) 500 MG capsule  2 times daily        01/21/23 0526             Note:  This document was prepared using Dragon voice recognition software and may include  unintentional dictation errors.   Sharyn Creamer, MD 01/21/23 1610    Sharyn Creamer, MD 01/21/23 [phone]

## 2023-01-21 NOTE — Telephone Encounter (Signed)
Noted  

## 2023-01-22 LAB — URINE CULTURE

## 2023-01-26 DIAGNOSIS — M1990 Unspecified osteoarthritis, unspecified site: Secondary | ICD-10-CM | POA: Diagnosis not present

## 2023-02-03 ENCOUNTER — Ambulatory Visit: Payer: Medicare HMO | Admitting: Internal Medicine

## 2023-02-03 DIAGNOSIS — D333 Benign neoplasm of cranial nerves: Secondary | ICD-10-CM | POA: Diagnosis not present

## 2023-02-05 DIAGNOSIS — G912 (Idiopathic) normal pressure hydrocephalus: Secondary | ICD-10-CM | POA: Diagnosis not present

## 2023-02-05 DIAGNOSIS — Z96653 Presence of artificial knee joint, bilateral: Secondary | ICD-10-CM | POA: Diagnosis not present

## 2023-02-05 DIAGNOSIS — Z7901 Long term (current) use of anticoagulants: Secondary | ICD-10-CM | POA: Diagnosis not present

## 2023-02-05 DIAGNOSIS — W19XXXA Unspecified fall, initial encounter: Secondary | ICD-10-CM | POA: Diagnosis not present

## 2023-02-05 DIAGNOSIS — I4891 Unspecified atrial fibrillation: Secondary | ICD-10-CM | POA: Diagnosis not present

## 2023-02-05 DIAGNOSIS — I10 Essential (primary) hypertension: Secondary | ICD-10-CM | POA: Diagnosis not present

## 2023-02-05 DIAGNOSIS — T85618A Breakdown (mechanical) of other specified internal prosthetic devices, implants and grafts, initial encounter: Secondary | ICD-10-CM | POA: Diagnosis not present

## 2023-02-05 DIAGNOSIS — T8501XA Breakdown (mechanical) of ventricular intracranial (communicating) shunt, initial encounter: Secondary | ICD-10-CM | POA: Diagnosis not present

## 2023-02-05 DIAGNOSIS — Z982 Presence of cerebrospinal fluid drainage device: Secondary | ICD-10-CM | POA: Diagnosis not present

## 2023-02-05 DIAGNOSIS — G910 Communicating hydrocephalus: Secondary | ICD-10-CM | POA: Diagnosis not present

## 2023-02-05 DIAGNOSIS — I2510 Atherosclerotic heart disease of native coronary artery without angina pectoris: Secondary | ICD-10-CM | POA: Diagnosis not present

## 2023-02-05 DIAGNOSIS — R42 Dizziness and giddiness: Secondary | ICD-10-CM | POA: Diagnosis not present

## 2023-02-05 DIAGNOSIS — R531 Weakness: Secondary | ICD-10-CM | POA: Diagnosis not present

## 2023-02-05 DIAGNOSIS — E7800 Pure hypercholesterolemia, unspecified: Secondary | ICD-10-CM | POA: Diagnosis not present

## 2023-02-05 DIAGNOSIS — Z9884 Bariatric surgery status: Secondary | ICD-10-CM | POA: Diagnosis not present

## 2023-02-05 DIAGNOSIS — R195 Other fecal abnormalities: Secondary | ICD-10-CM | POA: Diagnosis not present

## 2023-02-05 DIAGNOSIS — T8509XA Other mechanical complication of ventricular intracranial (communicating) shunt, initial encounter: Secondary | ICD-10-CM | POA: Diagnosis not present

## 2023-02-05 DIAGNOSIS — R000 Tachycardia, unspecified: Secondary | ICD-10-CM | POA: Diagnosis not present

## 2023-02-06 DIAGNOSIS — T85618A Breakdown (mechanical) of other specified internal prosthetic devices, implants and grafts, initial encounter: Secondary | ICD-10-CM | POA: Insufficient documentation

## 2023-02-09 ENCOUNTER — Telehealth: Payer: Self-pay

## 2023-02-09 NOTE — Transitions of Care (Post Inpatient/ED Visit) (Signed)
02/09/2023  Name: Brandon Marks MRN: [id number] DOB: 15-Apr-1956  Today's TOC FU Call Status: Today's TOC FU Call Status:: Successful TOC FU Call Competed TOC FU Call Complete Date: 02/09/23  Transition Care Management Follow-up Telephone Call Date of Discharge: 02/08/23 Discharge Facility: Other ([facility]) Name of Other ([facility]) [facility] Facility: [facility] Type of Discharge: Inpatient Admission Primary Inpatient Discharge Diagnosis:: "dizziness,lightheadedness" How have you been since you were released from the hospital?: Better (Pt shares that he is "not 100% yet but getting there." States that his "shunt came a loose" which caused his sxs & admission to hospital. Pt states "MD put it back together and doing good ever since.") Any questions or concerns?: No  Items Reviewed: Did you receive and understand the discharge instructions provided?: Yes Medications obtained,verified, and reconciled?: Yes (Medications Reviewed) Any new allergies since your discharge?: No Dietary orders reviewed?: Yes Type of Diet Ordered:: low salt/heart healthy Do you have support at home?: No People in Home: alone  Medications Reviewed Today: Medications Reviewed Today     Reviewed by Charlyn Minerva, RN (Registered Nurse) on 02/09/23 at 1602  Med List Status: <None>   Medication Order Taking? Sig Documenting Provider Last Dose Status Informant  acetaminophen (TYLENOL) 325 MG tablet [id number] Yes Take 325-650 mg by mouth every 6 (six) hours as needed for headache. [provider] Taking Active Self  apixaban (ELIQUIS) 5 MG TABS tablet [id number] Yes Take 1 tablet (5 mg total) by mouth 2 (two) times daily. Flossie Dibble, NP Taking Active   atorvastatin (LIPITOR) 10 MG tablet [id number] Yes Take 1 tablet (10 mg total) by mouth daily. Flossie Dibble, NP Taking Active   cephALEXin (KEFLEX) 500 MG capsule [id number] No Take 1 capsule (500 mg total) by mouth 2 (two)  times daily.  Patient not taking: Reported on 02/09/2023   Sharyn Creamer, MD Not Taking Active   cetirizine (ZYRTEC) 5 MG tablet [id number] No Take 1 tablet (5 mg total) by mouth daily.  Patient not taking: Reported on 02/09/2023   Gustavus Bryant, FNP Not Taking Active   cyanocobalamin 1000 MCG tablet [id number] Yes Take 3,000 mcg by mouth daily. [provider] Taking Active Self  docusate sodium (COLACE) 100 MG capsule [id number] No Take 1 capsule (100 mg total) by mouth 2 (two) times daily as needed for mild constipation.  Patient not taking: Reported on 02/09/2023   Rolly Salter, MD Not Taking Active Self           Med Note Rich Number Jun 26, 2021  4:03 PM) Ran out  famotidine (PEPCID) 20 MG tablet [id number] No Take 1 tablet (20 mg total) by mouth 2 (two) times daily.  Patient not taking: Reported on 02/09/2023   Gustavus Bryant, FNP Not Taking Active   furosemide (LASIX) 20 MG tablet [id number] Yes Take 1 tablet (20 mg total) by mouth daily as needed for edema (for swelling in legs/ankle). Marcine Matar, MD Taking Active   losartan (COZAAR) 25 MG tablet [id number] No Take 0.5 tablets (12.5 mg total) by mouth daily.  Patient not taking: Reported on 02/09/2023   Flossie Dibble, NP Not Taking Active   metoprolol succinate (TOPROL XL) 25 MG 24 hr tablet [id number] No Take 1 tablet (25 mg total) by mouth daily.  Patient not taking: Reported on 02/09/2023   Flossie Dibble, NP Not Taking Active   triamcinolone cream (KENALOG) 0.1 % [id number] No Apply 1 Application  topically 2 (two) times daily.  Patient not taking: Reported on 02/09/2023   Gustavus Bryant, FNP Not Taking Active             Home Care and Equipment/Supplies: Were Home Health Services Ordered?: NA Any new equipment or medical supplies ordered?: NA  Functional Questionnaire: Do you need assistance with bathing/showering or dressing?: No Do you need assistance with meal preparation?: No Do you need  assistance with eating?: No Do you have difficulty maintaining continence: No Do you need assistance with getting out of bed/getting out of a chair/moving?: No Do you have difficulty managing or taking your medications?: No  Follow up appointments reviewed: PCP Follow-up appointment confirmed?: Yes Date of PCP follow-up appointment?: 02/23/23 Follow-up Provider: Dr. Laural Benes Specialist [facility] Follow-up appointment confirmed?: Yes Follow-Up Specialty Provider:: pt voices surgeon office called him today with follow up appt-he wrote it down but doesn't recall it off the top of his head Do you need transportation to your follow-up appointment?: No Do you understand care options if your condition(s) worsen?: Yes-patient verbalized understanding  SDOH Interventions Today    Flowsheet Row Most Recent Value  SDOH Interventions   Food Insecurity Interventions Intervention Not Indicated  Transportation Interventions Intervention Not Indicated  [instructed pt about transportation available through insurance provider and how to contact if needed]      TOC Interventions Today    Flowsheet Row Most Recent Value  TOC Interventions   TOC Interventions Discussed/Reviewed TOC Interventions Discussed, Arranged PCP follow up less than 12 days/Care Guide scheduled, Post op wound/incision care, S/S of infection, Post discharge activity limitations per provider      Interventions Today    Flowsheet Row Most Recent Value  Chronic Disease   Chronic disease during today's visit Hypertension (HTN), Atrial Fibrillation (AFib)  General Interventions   General Interventions Discussed/Reviewed Durable Medical Equipment (DME), Doctor Visits  Doctor Visits Discussed/Reviewed Doctor Visits Discussed, PCP, Specialist  Durable Medical Equipment (DME) BP Cuff, Other  [scale-wgt monitoring-pt reports he has swelling to his feet at times-encouraged to montior wgt and take Lasix as ordered for sx mgmt]  Education  Interventions   Education Provided Provided Education  Provided Verbal Education On Nutrition, When to see the doctor, Medication, Other  Nutrition Interventions   Nutrition Discussed/Reviewed Nutrition Discussed, Adding fruits and vegetables, Decreasing fats, Decreasing salt, Fluid intake, Increasing proteins  Pharmacy Interventions   Pharmacy Dicussed/Reviewed Pharmacy Topics Discussed, Medications and their functions  Safety Interventions   Safety Discussed/Reviewed Safety Discussed       Alessandra Grout [facility] Care Management Care Management Community Coordinator Direct Phone: [phone] Toll Free: [phone] Fax: [phone]

## 2023-02-18 DIAGNOSIS — Z4802 Encounter for removal of sutures: Secondary | ICD-10-CM | POA: Diagnosis not present

## 2023-02-23 ENCOUNTER — Ambulatory Visit: Payer: Medicare HMO | Attending: Internal Medicine | Admitting: Internal Medicine

## 2023-02-23 ENCOUNTER — Encounter: Payer: Self-pay | Admitting: Internal Medicine

## 2023-02-23 VITALS — BP 125/79 | HR 93 | Temp 97.8°F | Ht 73.0 in | Wt 255.0 lb

## 2023-02-23 DIAGNOSIS — I4821 Permanent atrial fibrillation: Secondary | ICD-10-CM

## 2023-02-23 DIAGNOSIS — I5022 Chronic systolic (congestive) heart failure: Secondary | ICD-10-CM | POA: Diagnosis not present

## 2023-02-23 DIAGNOSIS — R269 Unspecified abnormalities of gait and mobility: Secondary | ICD-10-CM

## 2023-02-23 DIAGNOSIS — R42 Dizziness and giddiness: Secondary | ICD-10-CM

## 2023-02-23 DIAGNOSIS — G912 (Idiopathic) normal pressure hydrocephalus: Secondary | ICD-10-CM

## 2023-02-23 DIAGNOSIS — H539 Unspecified visual disturbance: Secondary | ICD-10-CM

## 2023-02-23 DIAGNOSIS — Z1211 Encounter for screening for malignant neoplasm of colon: Secondary | ICD-10-CM

## 2023-02-23 MED ORDER — METOPROLOL SUCCINATE ER 25 MG PO TB24
12.5000 mg | ORAL_TABLET | Freq: Every day | ORAL | 6 refills | Status: AC
Start: 2023-02-23 — End: ?

## 2023-02-23 MED ORDER — FUROSEMIDE 20 MG PO TABS
20.0000 mg | ORAL_TABLET | Freq: Every day | ORAL | 3 refills | Status: AC | PRN
Start: 2023-02-23 — End: ?

## 2023-02-23 NOTE — Progress Notes (Signed)
Patient ID: Brandon Marks, male    DOB: 05-08-1956  MRN: [id number]  CC: Hospitalization Follow-up (Hospitalization f/u. Med refill. /Intermittent slight blurred vision /Reports dizziness, unsteadiness - possibly needs shunt adjustment)   Subjective: Brandon Marks is a 67 y.o. male who presents for hosp f/u His concerns today include:  Pt with hx of HTN, A.fib on systolic CHF with EF 30-35%, NICM, ureteral calculus, OSA (has CPAP but not using.  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      Patient hospitalized at [facility] 7/11-14/2024 with 3 weeks history of progressive dizziness, generalized weakness and falls.  Thought to be due to malfunction of the VP shunt.  He underwent revision of his VP shunt by his neurosurgeon.  Postprocedure imaging showed shunt to be patent.  Today: Dizziness: reports a little dizziness but much improved from since the revision of the shunt.  Feels off balance  if he tries to walk to quickly.  No falls since hosp. Uses mobility chair around the house consistently and when going outside.  Did not bring with him today because it is raining out side.  Does have a ramp at back of truck.  Has a 4-prong cane but did not bring that with him today either. -sees fluttering in vision both eyes x 1 mth.  Though it would get better since hosp but has not.  Has a f/u appt coming with his neurosurgeon again to get the shunt checked.    HTN/A-fib/systolic CHF: Saw cardiology in follow-up in March of this year.  He was continued on Eliquis 5 mg BID, Toprol 25 mg and Cozaar 25 mg daily. On Lipitor as well. Reports he only takes the Eliquis and Lipitor.  Reports BP has been good.   No palpitations, LE edema.  SOB only if he tries to do too much.  Sleeps on 1 pillow.    Pos Dep  screen:  denies any issues with depression.  "I just wish I was in better shape."  Lives with a male room-mate.  However she works nights.  He does some cooking but eats out a lot.  Bath and toilets independently.  Has a shower/tub.  Has shower chair.   HM:  declines Shingrix.  Due for c-scope.  Last 1 was done over 10 years ago and had some polyps removed.  States that he had it done here in [place] but not sure if it was [employer]. Patient Active Problem List   Diagnosis Date Noted   Calcium oxalate calculus 07/06/2021   NICM (nonischemic cardiomyopathy) (HCC)    Atrial fibrillation with rapid ventricular response (HCC) 06/08/2021   Sepsis secondary to UTI (HCC) 06/08/2021   Severe sepsis with septic shock (HCC) 06/06/2021   Left ureteral calculus 06/06/2021   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HCC) 02/22/2021   23-polyvalent pneumococcal polysaccharide vaccine declined 02/22/2021   Status post excision of acoustic neuroma 02/07/2020   S/P right TKA 09/13/2019   S/P left TKA 08/11/2019   Genu varum of both lower extremities 06/13/2019   Vitamin B12 deficiency 06/13/2019   Obesity (BMI 30-39.9) 12/27/2018   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   Tinnitus of right ear 12/27/2018   Gait disturbance 12/27/2018   Normochromic anemia 12/27/2018   Primary osteoarthritis of both knees 12/27/2018   Diastolic heart  failure (HCC) 09/11/2017   DDD (degenerative disc disease), cervical 09/04/2017   Dyspnea, chronic DOE 02/27/2012   Herniated disc    Hyperlipidemia 02/23/2009   Diastolic dysfunction, left ventricle 02/23/2009   Essential hypertension 10/18/2007   OSA on CPAP 10/11/2007     Current Outpatient Medications on File Prior to Visit  Medication Sig Dispense Refill   apixaban (ELIQUIS) 5 MG TABS tablet Take 1 tablet (5 mg total) by mouth 2 (two) times daily. 180 tablet 1   atorvastatin (LIPITOR) 10 MG tablet Take 1 tablet (10 mg total) by mouth daily.  (Patient not taking: Reported on 02/23/2023) 90 tablet 1   cyanocobalamin 1000 MCG tablet Take 3,000 mcg by mouth daily. (Patient not taking: Reported on 02/23/2023)     No current facility-administered medications on file prior to visit.    Allergies  Allergen Reactions   Codeine Nausea And Vomiting   Oxycodone Other (See Comments)    Upset GI    Social History   Socioeconomic History   Marital status: Divorced    Spouse name: Not on file   Number of children: 0   Years of education: some college   Highest education level: Not on file  Occupational History   Occupation: Retired  Tobacco Use   Smoking status: Never   Smokeless tobacco: Never  Vaping Use   Vaping status: Never Used  Substance and Sexual Activity   Alcohol use: No   Drug use: No   Sexual activity: Not on file  Other Topics Concern   Not on file  Social History Narrative   Denies caffeine use    Social Determinants of Health   Financial Resource Strain: High Risk (02/06/2023)   Received from [facility]   Overall Financial Resource Strain (CARDIA)    Difficulty of Paying Living Expenses: Very hard  Food Insecurity: No Food Insecurity (02/09/2023)   Hunger Vital Sign    Worried About Running Out of Food in the Last Year: Never true    Ran Out of Food in the Last Year: Never true  Transportation Needs: No Transportation Needs (02/09/2023)   PRAPARE - Administrator, Civil Service (Medical): No    Lack of Transportation (Non-Medical): No  Physical Activity: Insufficiently Active (06/27/2022)   Exercise Vital Sign    Days of Exercise per Week: 5 days    Minutes of Exercise per Session: 10 min  Stress: No Stress Concern Present (06/27/2022)   [employer] of Occupational Health - Occupational Stress Questionnaire    Feeling of Stress : Not at all  Social Connections: Socially Isolated (06/27/2022)   Social Connection and Isolation Panel [NHANES]    Frequency of  Communication with Friends and Family: More than three times a week    Frequency of Social Gatherings with Friends and Family: More than three times a week    Attends Religious Services: Never    Database administrator or Organizations: No    Attends Banker Meetings: Never    Marital Status: Divorced  Catering manager Violence: Not At Risk (06/27/2022)   Humiliation, Afraid, Rape, and Kick questionnaire    Fear of Current or Ex-Partner: No    Emotionally Abused: No    Physically Abused: No    Sexually Abused: No    Family History  Problem Relation Age of Onset   Hypertension Mother    Heart failure Mother    Alzheimer's disease Mother    Skin cancer Mother  Heart disease Father    Alzheimer's disease Maternal Grandmother    Alzheimer's disease Maternal Grandfather     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 at [facility]  12/27/2019   accoustic neuroma   CARDIAC CATHETERIZATION  02/20/2009   normal coronary arteries   CYSTOSCOPY WITH STENT PLACEMENT Left 06/05/2021   Procedure: CYSTOSCOPY WITH STENT PLACEMENT;  Surgeon: Vanna Scotland, MD;  Location: [facility] ORS;  Service: Urology;  Laterality: Left;   CYSTOSCOPY/URETEROSCOPY/HOLMIUM LASER/STENT PLACEMENT Left 07/01/2021   Procedure: CYSTOSCOPY/URETEROSCOPY/HOLMIUM LASER/STENT EXCHANGE;  Surgeon: Vanna Scotland, MD;  Location: [facility] ORS;  Service: Urology;  Laterality: Left;   EYE SURGERY     bilateral cataract with lens implant   INCISION AND DRAINAGE PERIRECTAL ABSCESS N/A 01/02/2018   Procedure: IRRIGATION AND DEBRIDEMENT PERIRECTAL ABSCESS;  Surgeon: Abigail Miyamoto, MD;  Location: [facility] OR;  Service: General;  Laterality: N/A;   IRRIGATION AND DEBRIDEMENT BUTTOCKS N/A 01/05/2018   Procedure: DEBRIDEMENT BUTTOCKS ABSCESS;  Surgeon: Violeta Gelinas, MD;  Location: [facility] OR;  Service: General;  Laterality: N/A;   KNEE ARTHROSCOPY  2010   Lt   LEFT HEART CATH AND CORONARY  ANGIOGRAPHY N/A 06/10/2021   Procedure: LEFT HEART CATH AND CORONARY ANGIOGRAPHY;  Surgeon: Iran Ouch, MD;  Location: [facility] INVASIVE CV LAB;  Service: Cardiovascular;  Laterality: N/A;   NM MYOCAR PERF WALL MOTION  09/28/2007   small area of reversibility in the anterolateral wall at the apex concerning for ischemia   SHOULDER ARTHROSCOPY  10/2010   Rt   TOTAL KNEE ARTHROPLASTY Left 08/11/2019   Procedure: TOTAL KNEE ARTHROPLASTY;  Surgeon: Durene Romans, MD;  Location: [facility] ORS;  Service: Orthopedics;  Laterality: Left;  70 mins   TOTAL KNEE ARTHROPLASTY Right 09/13/2019   Procedure: TOTAL KNEE ARTHROPLASTY;  Surgeon: Durene Romans, MD;  Location: [facility] ORS;  Service: Orthopedics;  Laterality: Right;  70 mins   WOUND DEBRIDEMENT N/A 01/04/2018   Procedure: IRRIGATION AND DEBRIDEMENT OF BUTTOCKS AND REMOVAL OF TICK FROM RIGHT TESTICLE;  Surgeon: Violeta Gelinas, MD;  Location: [facility] OR;  Service: General;  Laterality: N/A;    ROS: Review of Systems Negative except as stated above  PHYSICAL EXAM: BP 125/79 (BP Location: Left Arm, Patient Position: Sitting, Cuff Size: Normal)   Pulse 93   Temp 97.8 F (36.6 C) (Oral)   Ht 6\' 1"  (1.854 m)   Wt 255 lb (115.7 kg)   SpO2 99%   BMI 33.64 kg/m   Physical Exam   General appearance - alert, well appearing, elderly caucasian male and in no distress Mental status - normal mood, behavior.  Patient is a bit forgetful Chest - clear to auscultation, no wheezes, rales or rhonchi, symmetric air entry Heart -irregularly irregular but appears rate controlled Neurological -patient presents without his mobility chair or any mobility device.  He walks with a stooped posture leaning forward.  Wide-based gait, slow walking speed with low foot to floor clearance Extremities -no lower extremity edema     02/23/2023   10:56 AM 10/02/2022   10:56 AM 06/27/2022    1:42 PM  Depression screen PHQ 2/9  Decreased Interest 3 3 0  Down, Depressed, Hopeless 1 3 0   PHQ - 2 Score 4 6 0  Altered sleeping 3 0   Tired, decreased energy 3 3   Change in appetite 0 0   Feeling bad or failure about yourself  0 1   Trouble concentrating 0 0   Moving  slowly or fidgety/restless 0 0   Suicidal thoughts 0 0   PHQ-9 Score 10 10        Latest Ref Rng & Units 01/20/2023    9:53 PM 01/14/2023    6:53 AM 10/02/2022   12:11 PM  CMP  Glucose 70 - 99 mg/dL 78  81  94   BUN 8 - 23 mg/dL 22  18  18    Creatinine 0.61 - 1.24 mg/dL 0.98  1.19  1.47   Sodium 135 - 145 mmol/L 135  140  140   Potassium 3.5 - 5.1 mmol/L 3.9  3.6  4.8   Chloride 98 - 111 mmol/L 106  108  105   CO2 22 - 32 mmol/L 23  25  23    Calcium 8.9 - 10.3 mg/dL 8.5  8.3  8.9   Total Protein 6.0 - 8.5 g/dL   7.1   Total Bilirubin 0.0 - 1.2 mg/dL   0.6   Alkaline Phos 44 - 121 IU/L   83   AST 0 - 40 IU/L   20   ALT 0 - 44 IU/L   15    Lipid Panel     Component Value Date/Time   CHOL 111 03/24/2022 1540   TRIG 42 03/24/2022 1540   HDL 53 03/24/2022 1540   CHOLHDL 2.1 03/24/2022 1540   LDLCALC 47 03/24/2022 1540    CBC    Component Value Date/Time   WBC 7.8 01/20/2023 2153   RBC 4.69 01/20/2023 2153   HGB 12.6 (L) 01/20/2023 2153   HGB 13.1 10/02/2022 1211   HCT 39.2 01/20/2023 2153   HCT 42.0 10/02/2022 1211   PLT 255 01/20/2023 2153   PLT 239 10/02/2022 1211   MCV 83.6 01/20/2023 2153   MCV 85 10/02/2022 1211   MCH 26.9 01/20/2023 2153   MCHC 32.1 01/20/2023 2153   RDW 15.7 (H) 01/20/2023 2153   RDW 14.2 10/02/2022 1211   LYMPHSABS 0.8 01/14/2023 0653   LYMPHSABS 1.2 03/24/2022 1540   MONOABS 0.4 01/14/2023 0653   EOSABS 0.1 01/14/2023 0653   EOSABS 0.0 03/24/2022 1540   BASOSABS 0.0 01/14/2023 0653   BASOSABS 0.0 03/24/2022 1540    ASSESSMENT AND PLAN: 1. Dizziness 2. NPH (normal pressure hydrocephalus) (HCC) 3. Gait disturbance -Patient reports dizziness has improved since revision of his VP shunt.  He has not had any further falls.  He has another follow-up  appointment coming up with his neurosurgeon at [facility].  Advised him to inform the neurosurgeon about the visual changes that he has been experiencing.  Also advised that he calls and makes an appointment with his ophthalmologist Dr. Dione Booze. -Encouraged him to use his 4-prong cane when he is unable to travel with his mobility chair   4. Vision changes See #3 above.  5. Chronic systolic congestive heart failure (HCC) Stable and compensated.  Refill furosemide for him to keep on hand to use as needed.  Blood pressure normal range.  He has not been taking Cozaar and Toprol consistently.  I recommend that he at least take the Toprol 25 mg half a tablet daily.  He is agreeable to doing so.  Keep upcoming appointment with cardiology in September. - furosemide (LASIX) 20 MG tablet; Take 1 tablet (20 mg total) by mouth daily as needed for edema (for swelling in legs/ankle).  Dispense: 30 tablet; Refill: 3  6. Permanent atrial fibrillation (HCC) Continue Eliquis.  Restart metoprolol at 12.5 mg daily. - metoprolol succinate (TOPROL  XL) 25 MG 24 hr tablet; Take 0.5 tablets (12.5 mg total) by mouth daily.  Dispense: 30 tablet; Refill: 6  7. Screening for colon cancer - Ambulatory referral to Gastroenterology     Patient was given the opportunity to ask questions.  Patient verbalized understanding of the plan and was able to repeat key elements of the plan.   This documentation was completed using Paediatric nurse.  Any transcriptional errors are unintentional.  Orders Placed This Encounter  Procedures   Ambulatory referral to Gastroenterology     Requested Prescriptions   Signed Prescriptions Disp Refills   furosemide (LASIX) 20 MG tablet 30 tablet 3    Sig: Take 1 tablet (20 mg total) by mouth daily as needed for edema (for swelling in legs/ankle).   metoprolol succinate (TOPROL XL) 25 MG 24 hr tablet 30 tablet 6    Sig: Take 0.5 tablets (12.5 mg total) by mouth daily.     Return in about 4 months (around 06/26/2023).  Brandon Blue, MD, FACP

## 2023-02-26 DIAGNOSIS — M1990 Unspecified osteoarthritis, unspecified site: Secondary | ICD-10-CM | POA: Diagnosis not present

## 2023-03-29 DIAGNOSIS — M1990 Unspecified osteoarthritis, unspecified site: Secondary | ICD-10-CM | POA: Diagnosis not present

## 2023-04-13 ENCOUNTER — Ambulatory Visit: Payer: Medicare HMO | Attending: Cardiovascular Disease | Admitting: Cardiovascular Disease

## 2023-04-14 ENCOUNTER — Encounter: Payer: Self-pay | Admitting: Cardiovascular Disease

## 2023-04-28 DIAGNOSIS — M1990 Unspecified osteoarthritis, unspecified site: Secondary | ICD-10-CM | POA: Diagnosis not present

## 2023-05-12 ENCOUNTER — Ambulatory Visit: Payer: Medicare HMO | Attending: Internal Medicine

## 2023-05-12 VITALS — Ht 73.0 in | Wt 255.0 lb

## 2023-05-12 DIAGNOSIS — Z0000 Encounter for general adult medical examination without abnormal findings: Secondary | ICD-10-CM | POA: Diagnosis not present

## 2023-05-12 NOTE — Patient Instructions (Signed)
Mr. Mittelman , Thank you for taking time to come for your Medicare Wellness Visit. I appreciate your ongoing commitment to your health goals. Please review the following plan we discussed and let me know if I can assist you in the future.   Referrals/Orders/Follow-Ups/Clinician Recommendations: Aim for 30 minutes of exercise or brisk walking, 6-8 glasses of water, and 5 servings of fruits and vegetables each day.  This is a list of the screening recommended for you and due dates:  Health Maintenance  Topic Date Due   Hepatitis C Screening  Never done   Colon Cancer Screening  Never done   COVID-19 Vaccine (1 - 2023-24 season) Never done   Pneumonia Vaccine (2 of 2 - PPSV23 or PCV20) 05/31/2023*   Flu Shot  10/26/2023*   Zoster (Shingles) Vaccine (1 of 2) 02/23/2024*   Medicare Annual Wellness Visit  05/11/2024   DTaP/Tdap/Td vaccine (2 - Td or Tdap) 06/12/2029   HPV Vaccine  Aged Out  *Topic was postponed. The date shown is not the original due date.    Advanced directives: (ACP Link)Information on Advanced Care Planning can be found at [facility] Advance Health Care Directives Advance Health Care Directives ([email or web address]   Next Medicare Annual Wellness Visit scheduled for next year: Yes

## 2023-05-12 NOTE — Progress Notes (Signed)
Subjective:   Brandon Marks is a 67 y.o. male who presents for Medicare Annual/Subsequent preventive examination.  Visit Complete: Virtual I connected with  Brandon Marks on 05/12/23 by a audio enabled telemedicine application and verified that I am speaking with the correct person using two identifiers.  Patient Location: Home  Provider Location: Home Office  I discussed the limitations of evaluation and management by telemedicine. The patient expressed understanding and agreed to proceed.  Vital Signs: Because this visit was a virtual/telehealth visit, some criteria may be missing or patient reported. Any vitals not documented were not able to be obtained and vitals that have been documented are patient reported.  Cardiac Risk Factors include: advanced age (>19men, >17 women);male gender;hypertension;sedentary lifestyle;obesity (BMI >30kg/m2);dyslipidemia     Objective:    Today's Vitals   05/12/23 2253  Weight: 255 lb (115.7 kg)  Height: 6\' 1"  (1.854 m)   Body mass index is 33.64 kg/m.     05/12/2023   11:01 PM 09/16/2022    9:43 AM 06/27/2022    1:42 PM 06/28/2021   10:55 AM 06/05/2021    1:47 PM 05/06/2021    9:56 AM 04/01/2021    2:12 PM  Advanced Directives  Does Patient Have a Medical Advance Directive? No No No No No No No  Would patient like information on creating a medical advance directive? Yes (MAU/Ambulatory/Procedural Areas - Information given) No - Patient declined Yes (ED - Information included in AVS) No - Patient declined No - Patient declined  No - Patient declined    Current Medications (verified) Outpatient Encounter Medications as of 05/12/2023  Medication Sig   furosemide (LASIX) 20 MG tablet Take 1 tablet (20 mg total) by mouth daily as needed for edema (for swelling in legs/ankle).   metoprolol succinate (TOPROL XL) 25 MG 24 hr tablet Take 0.5 tablets (12.5 mg total) by mouth daily.   apixaban (ELIQUIS) 5 MG TABS tablet Take 1 tablet (5 mg  total) by mouth 2 (two) times daily. (Patient not taking: Reported on 05/12/2023)   atorvastatin (LIPITOR) 10 MG tablet Take 1 tablet (10 mg total) by mouth daily. (Patient not taking: Reported on 05/12/2023)   cyanocobalamin 1000 MCG tablet Take 3,000 mcg by mouth daily. (Patient not taking: Reported on 05/12/2023)   No facility-administered encounter medications on file as of 05/12/2023.    Allergies (verified) Codeine and Oxycodone   History: Past Medical History:  Diagnosis Date   Arthritis    Coronary artery disease    CSF leak    a. 12/2019 following resection of vestibular schwannoma; b.  12/2020 status post lumbar puncture for ventriculomegaly; e.  01/2021: Status post lumbar drain ([facility]).  Pending VP shunt.   Herniated disc    High cholesterol    History of kidney stones    History of stress test    a. 09/2007 MV: EF 63%, small area of anterolateral and apical reversibility; b.  12/2016 MV: EF 62%.  Fixed small, mild mid anteroseptal and apical defect without reversibility.  Most likely attenuation.  Low risk study.   Hypertension    Morbid obesity (HCC)    PAC (premature atrial contraction)    Persistent atrial fibrillation (HCC)    a. first noted on 12 lead ECG 10/2019.   Sleep apnea    non compliant with c-pap   Vestibular schwannoma (HCC)    a. 12/27/2019 s/p resection ([facility]). Post-op course complicated by CSF leak/seizures req repair 01/06/2020 and high volume LP  on 01/16/2020.   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 at [facility]  12/27/2019   accoustic neuroma   CARDIAC CATHETERIZATION  02/20/2009   normal coronary arteries   CYSTOSCOPY WITH STENT PLACEMENT Left 06/05/2021   Procedure: CYSTOSCOPY WITH STENT PLACEMENT;  Surgeon: Vanna Scotland, MD;  Location: [facility] ORS;  Service: Urology;  Laterality: Left;   CYSTOSCOPY/URETEROSCOPY/HOLMIUM LASER/STENT PLACEMENT Left 07/01/2021   Procedure: CYSTOSCOPY/URETEROSCOPY/HOLMIUM LASER/STENT  EXCHANGE;  Surgeon: Vanna Scotland, MD;  Location: [facility] ORS;  Service: Urology;  Laterality: Left;   EYE SURGERY     bilateral cataract with lens implant   INCISION AND DRAINAGE PERIRECTAL ABSCESS N/A 01/02/2018   Procedure: IRRIGATION AND DEBRIDEMENT PERIRECTAL ABSCESS;  Surgeon: Abigail Miyamoto, MD;  Location: [facility] OR;  Service: General;  Laterality: N/A;   IRRIGATION AND DEBRIDEMENT BUTTOCKS N/A 01/05/2018   Procedure: DEBRIDEMENT BUTTOCKS ABSCESS;  Surgeon: Violeta Gelinas, MD;  Location: [facility] OR;  Service: General;  Laterality: N/A;   KNEE ARTHROSCOPY  2010   Lt   LEFT HEART CATH AND CORONARY ANGIOGRAPHY N/A 06/10/2021   Procedure: LEFT HEART CATH AND CORONARY ANGIOGRAPHY;  Surgeon: Iran Ouch, MD;  Location: [facility] INVASIVE CV LAB;  Service: Cardiovascular;  Laterality: N/A;   NM MYOCAR PERF WALL MOTION  09/28/2007   small area of reversibility in the anterolateral wall at the apex concerning for ischemia   SHOULDER ARTHROSCOPY  10/2010   Rt   TOTAL KNEE ARTHROPLASTY Left 08/11/2019   Procedure: TOTAL KNEE ARTHROPLASTY;  Surgeon: Durene Romans, MD;  Location: [facility] ORS;  Service: Orthopedics;  Laterality: Left;  70 mins   TOTAL KNEE ARTHROPLASTY Right 09/13/2019   Procedure: TOTAL KNEE ARTHROPLASTY;  Surgeon: Durene Romans, MD;  Location: [facility] ORS;  Service: Orthopedics;  Laterality: Right;  70 mins   WOUND DEBRIDEMENT N/A 01/04/2018   Procedure: IRRIGATION AND DEBRIDEMENT OF BUTTOCKS AND REMOVAL OF TICK FROM RIGHT TESTICLE;  Surgeon: Violeta Gelinas, MD;  Location: [facility] OR;  Service: General;  Laterality: N/A;   Family History  Problem Relation Age of Onset   Hypertension Mother    Heart failure Mother    Alzheimer's disease Mother    Skin cancer Mother    Heart disease Father    Alzheimer's disease Maternal Grandmother    Alzheimer's disease Maternal Grandfather    Social History   Socioeconomic History   Marital status: Divorced    Spouse name: Not on file   Number of  children: 0   Years of education: some college   Highest education level: Not on file  Occupational History   Occupation: Retired  Tobacco Use   Smoking status: Never   Smokeless tobacco: Never  Vaping Use   Vaping status: Never Used  Substance and Sexual Activity   Alcohol use: No   Drug use: No   Sexual activity: Not on file  Other Topics Concern   Not on file  Social History Narrative   Denies caffeine use    Social Determinants of Health   Financial Resource Strain: Low Risk  (05/12/2023)   Overall Financial Resource Strain (CARDIA)    Difficulty of Paying Living Expenses: Not hard at all  Food Insecurity: No Food Insecurity (05/12/2023)   Hunger Vital Sign    Worried About Running Out of Food in the Last Year: Never true    Ran Out of Food in the Last Year: Never true  Transportation Needs: No Transportation Needs (05/12/2023)   PRAPARE - Transportation  Lack of Transportation (Medical): No    Lack of Transportation (Non-Medical): No  Physical Activity: Insufficiently Active (05/12/2023)   Exercise Vital Sign    Days of Exercise per Week: 3 days    Minutes of Exercise per Session: 10 min  Stress: No Stress Concern Present (05/12/2023)   [employer] of Occupational Health - Occupational Stress Questionnaire    Feeling of Stress : Not at all  Social Connections: Socially Isolated (05/12/2023)   Social Connection and Isolation Panel [NHANES]    Frequency of Communication with Friends and Family: More than three times a week    Frequency of Social Gatherings with Friends and Family: Three times a week    Attends Religious Services: Never    Active Member of Clubs or Organizations: No    Attends Engineer, structural: Never    Marital Status: Divorced    Tobacco Counseling Counseling given: Not Answered   Clinical Intake:  Pre-visit preparation completed: Yes  Pain : No/denies pain     Diabetes: No  How often do you need to have someone  help you when you read instructions, pamphlets, or other written materials from your doctor or pharmacy?: 1 - Never  Interpreter Needed?: No  Information entered by :: Kandis Fantasia LPN   Activities of Daily Living    05/12/2023   10:55 PM 06/27/2022    1:51 PM  In your present state of health, do you have any difficulty performing the following activities:  Hearing? 0 1  Vision? 0 1  Difficulty concentrating or making decisions? 0 0  Walking or climbing stairs? 1 1  Dressing or bathing? 0 0  Doing errands, shopping? 1 1  Preparing Food and eating ? N Y  Using the Toilet? N N  In the past six months, have you accidently leaked urine? N N  Do you have problems with loss of bowel control? N N  Managing your Medications? N N  Managing your Finances? N N  Housekeeping or managing your Housekeeping? N Y    Patient Care Team: Marcine Matar, MD as PCP - General (Internal Medicine) Croitoru, Rachelle Hora, MD as PCP - Cardiology (Cardiology)  Indicate any recent Medical Services you may have received from other than [facility] providers in the past year (date may be approximate).     Assessment:   This is a routine wellness examination for Reynolds.  Hearing/Vision screen Hearing Screening - Comments:: Denies hearing difficulties   Vision Screening - Comments:: No vision problems; will schedule routine eye exam soon     Goals Addressed             This Visit's Progress    Increase physical activity        Depression Screen    05/12/2023   10:59 PM 02/23/2023   10:56 AM 10/02/2022   10:56 AM 06/27/2022    1:42 PM 05/30/2022    3:29 PM 03/24/2022    2:25 PM 02/22/2021    9:23 AM  PHQ 2/9 Scores  PHQ - 2 Score 4 4 6  0 1 0 0  PHQ- 9 Score 8 10 10   2      Fall Risk    05/12/2023   11:02 PM 02/23/2023   10:53 AM 10/02/2022   10:54 AM 06/27/2022    1:42 PM 05/30/2022    3:29 PM  Fall Risk   Falls in the past year? 0 1 1 1  0  Number falls in past yr: 0 1 1  1 0  Injury with  Fall? 0 0 0 0 0  Risk for fall due to : No Fall Risks History of fall(s) History of fall(s) No Fall Risks No Fall Risks  Follow up Falls prevention discussed;Education provided;Falls evaluation completed        MEDICARE RISK AT HOME: Medicare Risk at Home Any stairs in or around the home?: No If so, are there any without handrails?: No Home free of loose throw rugs in walkways, pet beds, electrical cords, etc?: Yes Adequate lighting in your home to reduce risk of falls?: Yes Life alert?: No Use of a cane, walker or w/c?: Yes Grab bars in the bathroom?: Yes Shower chair or bench in shower?: No Elevated toilet seat or a handicapped toilet?: Yes  TIMED UP AND GO:  Was the test performed?  No    Cognitive Function:    06/27/2022    1:42 PM  MMSE - Mini Mental State Exam  Not completed: Unable to complete        05/12/2023   11:02 PM 06/27/2022    1:45 PM  6CIT Screen  What Year? 0 points 0 points  What month? 0 points 0 points  What time? 0 points 0 points  Count back from 20 0 points 0 points  Months in reverse 0 points 0 points  Repeat phrase 2 points 0 points  Total Score 2 points 0 points    Immunizations Immunization History  Administered Date(s) Administered   Pneumococcal Conjugate-13 06/13/2019   Tdap 06/13/2019    TDAP status: Up to date  Flu Vaccine status: Declined, Education has been provided regarding the importance of this vaccine but patient still declined. Advised may receive this vaccine at local pharmacy or Health Dept. Aware to provide a copy of the vaccination record if obtained from local pharmacy or Health Dept. Verbalized acceptance and understanding.  Pneumococcal vaccine status: Declined,  Education has been provided regarding the importance of this vaccine but patient still declined. Advised may receive this vaccine at local pharmacy or Health Dept. Aware to provide a copy of the vaccination record if obtained from local pharmacy or Health  Dept. Verbalized acceptance and understanding.   Covid-19 vaccine status: Declined, Education has been provided regarding the importance of this vaccine but patient still declined. Advised may receive this vaccine at local pharmacy or Health Dept.or vaccine clinic. Aware to provide a copy of the vaccination record if obtained from local pharmacy or Health Dept. Verbalized acceptance and understanding.  Qualifies for Shingles Vaccine? Yes   Zostavax completed No   Shingrix Completed?: No.    Education has been provided regarding the importance of this vaccine. Patient has been advised to call insurance company to determine out of pocket expense if they have not yet received this vaccine. Advised may also receive vaccine at local pharmacy or Health Dept. Verbalized acceptance and understanding.  Screening Tests Health Maintenance  Topic Date Due   Hepatitis C Screening  Never done   Colonoscopy  Never done   COVID-19 Vaccine (1 - 2023-24 season) Never done   Pneumonia Vaccine 77+ Years old (2 of 2 - PPSV23 or PCV20) 05/31/2023 (Originally 08/08/2019)   INFLUENZA VACCINE  10/26/2023 (Originally 02/26/2023)   Zoster Vaccines- Shingrix (1 of 2) 02/23/2024 (Originally 03/12/2004)   Medicare Annual Wellness (AWV)  05/11/2024   DTaP/Tdap/Td (2 - Td or Tdap) 06/12/2029   HPV VACCINES  Aged Out    Health Maintenance  Health Maintenance Due  Topic Date Due  Hepatitis C Screening  Never done   Colonoscopy  Never done   COVID-19 Vaccine (1 - 2023-24 season) Never done    Colorectal cancer screening: Referral to GI placed  . Pt aware the office will call re: appt.  Lung Cancer Screening: (Low Dose CT Chest recommended if Age 56-80 years, 20 pack-year currently smoking OR have quit w/in 15years.) does not qualify.   Lung Cancer Screening Referral: n/a  Additional Screening:  Hepatitis C Screening: does qualify;  Vision Screening: Recommended annual ophthalmology exams for early detection of  glaucoma and other disorders of the eye. Is the patient up to date with their annual eye exam?  No  Who is the provider or what is the name of the office in which the patient attends annual eye exams? none If pt is not established with a provider, would they like to be referred to a provider to establish care? No .   Dental Screening: Recommended annual dental exams for proper oral hygiene  Community Resource Referral / Chronic Care Management: CRR required this visit?  No   CCM required this visit?  No     Plan:     I have personally reviewed and noted the following in the patient's chart:   Medical and social history Use of alcohol, tobacco or illicit drugs  Current medications and supplements including opioid prescriptions. Patient is not currently taking opioid prescriptions. Functional ability and status Nutritional status Physical activity Advanced directives List of other physicians Hospitalizations, surgeries, and ER visits in previous 12 months Vitals Screenings to include cognitive, depression, and falls Referrals and appointments  In addition, I have reviewed and discussed with patient certain preventive protocols, quality metrics, and best practice recommendations. A written personalized care plan for preventive services as well as general preventive health recommendations were provided to patient.     Kandis Fantasia [place], [place]   09/81/1916   After Visit Summary: (MyChart) Due to this being a telephonic visit, the after visit summary with patients personalized plan was offered to patient via MyChart   Nurse Notes: No concerns at this time

## 2023-05-29 DIAGNOSIS — M1990 Unspecified osteoarthritis, unspecified site: Secondary | ICD-10-CM | POA: Diagnosis not present

## 2023-06-04 ENCOUNTER — Encounter: Payer: Self-pay | Admitting: Internal Medicine

## 2023-06-04 ENCOUNTER — Ambulatory Visit (HOSPITAL_BASED_OUTPATIENT_CLINIC_OR_DEPARTMENT_OTHER): Payer: Medicare HMO | Admitting: Internal Medicine

## 2023-06-04 VITALS — BP 149/79 | HR 104 | Temp 97.9°F | Ht 73.0 in | Wt 267.0 lb

## 2023-06-04 DIAGNOSIS — Z5321 Procedure and treatment not carried out due to patient leaving prior to being seen by health care provider: Secondary | ICD-10-CM

## 2023-06-04 NOTE — Patient Instructions (Signed)
[facility] [address] [place], [place] [address] Phone: [phone] Fax: [phone]

## 2023-06-04 NOTE — Progress Notes (Signed)
Pt left before being seen. Message sent to front desk to call pt to see if he would like to reschedule.

## 2023-06-19 LAB — AMB RESULTS CONSOLE CBG: Glucose: 92

## 2023-06-19 NOTE — Progress Notes (Signed)
Pt declined SDOH.

## 2023-06-24 ENCOUNTER — Other Ambulatory Visit (HOSPITAL_BASED_OUTPATIENT_CLINIC_OR_DEPARTMENT_OTHER): Payer: Medicare HMO | Admitting: Pharmacist

## 2023-06-24 DIAGNOSIS — E7800 Pure hypercholesterolemia, unspecified: Secondary | ICD-10-CM

## 2023-06-24 NOTE — Progress Notes (Signed)
Pharmacy Quality Measure Review  This patient is appearing on a report for being at risk of failing the adherence measure for cholesterol (statin) medications this calendar year.   Medication: atorvastatin Last fill date: 02/09/2023 for 90 day supply  Pt no-showed PCP follow-up earlier this month.   Butch Penny, PharmD, Patsy [place], CPP Clinical Pharmacist [facility] [phone]

## 2023-06-28 DIAGNOSIS — M1990 Unspecified osteoarthritis, unspecified site: Secondary | ICD-10-CM | POA: Diagnosis not present

## 2023-07-15 ENCOUNTER — Telehealth: Payer: Self-pay | Admitting: *Deleted

## 2023-07-15 NOTE — Telephone Encounter (Signed)
Noted! Thank you

## 2023-07-15 NOTE — Telephone Encounter (Signed)
Copied from CRM [id number]. Topic: Appointment Scheduling - Scheduling Inquiry for Clinic >> Jul 15, 2023 10:54 AM Turkey B wrote: Reason for CRM: pt called in states doesn't want to have to wait to be seen after his scheduled time ,or he won't stay,  says he had to wait a long time the last time. I let him know I can't guarantee this, since may be held up with a previous pt.

## 2023-07-15 NOTE — Telephone Encounter (Signed)
In that case, it would be best to give a future appt where he is the first appointment of the afternoon ie 1:30 p.m slot.

## 2023-07-16 NOTE — Telephone Encounter (Signed)
Noted! Thank you

## 2023-07-17 ENCOUNTER — Encounter: Payer: Medicare HMO | Admitting: Internal Medicine

## 2023-07-29 DIAGNOSIS — R269 Unspecified abnormalities of gait and mobility: Secondary | ICD-10-CM | POA: Diagnosis not present

## 2023-07-29 DIAGNOSIS — M1990 Unspecified osteoarthritis, unspecified site: Secondary | ICD-10-CM | POA: Diagnosis not present

## 2023-08-03 ENCOUNTER — Encounter: Payer: Self-pay | Admitting: *Deleted

## 2023-08-03 NOTE — Progress Notes (Signed)
 Pt attended 06/19/23 screening event where his b/p was 134/79 and his blood sugar was 92. At the event, the pt noted his PCP was Joshua, that he had insurance, did not smoke, and pt did not identify any SDOH needs. Chart review indicates pt's PCP of record is Dr. Barnie Louder, from [facility], whom he last saw on 02/23/2023. Pt missed PCP appt in August and September, had a AWV at Dr. Ferdie office on 05/12/23, and has a future appt with Dr. Louder on 08/14/23. No additional health equity team support indicated at this time.

## 2023-08-14 ENCOUNTER — Encounter: Payer: Medicare HMO | Admitting: Internal Medicine

## 2023-08-31 DIAGNOSIS — R269 Unspecified abnormalities of gait and mobility: Secondary | ICD-10-CM | POA: Diagnosis not present

## 2023-08-31 DIAGNOSIS — M1990 Unspecified osteoarthritis, unspecified site: Secondary | ICD-10-CM | POA: Diagnosis not present

## 2023-09-01 ENCOUNTER — Ambulatory Visit: Payer: Medicare HMO | Attending: Internal Medicine | Admitting: Internal Medicine

## 2023-09-01 VITALS — BP 118/74 | HR 105 | Temp 97.9°F | Ht 73.0 in | Wt 276.0 lb

## 2023-09-01 DIAGNOSIS — E66812 Obesity, class 2: Secondary | ICD-10-CM

## 2023-09-01 DIAGNOSIS — G912 (Idiopathic) normal pressure hydrocephalus: Secondary | ICD-10-CM | POA: Diagnosis not present

## 2023-09-01 DIAGNOSIS — Z0000 Encounter for general adult medical examination without abnormal findings: Secondary | ICD-10-CM

## 2023-09-01 DIAGNOSIS — L570 Actinic keratosis: Secondary | ICD-10-CM | POA: Diagnosis not present

## 2023-09-01 DIAGNOSIS — D509 Iron deficiency anemia, unspecified: Secondary | ICD-10-CM | POA: Diagnosis not present

## 2023-09-01 DIAGNOSIS — I5022 Chronic systolic (congestive) heart failure: Secondary | ICD-10-CM | POA: Diagnosis not present

## 2023-09-01 DIAGNOSIS — Z6836 Body mass index (BMI) 36.0-36.9, adult: Secondary | ICD-10-CM

## 2023-09-01 DIAGNOSIS — I4821 Permanent atrial fibrillation: Secondary | ICD-10-CM

## 2023-09-01 DIAGNOSIS — Z1211 Encounter for screening for malignant neoplasm of colon: Secondary | ICD-10-CM

## 2023-09-01 MED ORDER — APIXABAN 5 MG PO TABS
5.0000 mg | ORAL_TABLET | Freq: Two times a day (BID) | ORAL | 1 refills | Status: AC
Start: 2023-09-01 — End: ?

## 2023-09-01 MED ORDER — ATORVASTATIN CALCIUM 10 MG PO TABS
10.0000 mg | ORAL_TABLET | Freq: Every day | ORAL | 1 refills | Status: DC
Start: 2023-09-01 — End: 2024-03-04

## 2023-09-01 NOTE — Progress Notes (Addendum)
 Patient ID: Brandon Marks, male    DOB: 06-Feb-1956  MRN: [id number]  CC: Annual Exam (Physical. Med refill. Layvonne referral to dermatology Janiece to colonoscopy referral)   Subjective: Brandon Marks is a 68 y.o. male who presents for annual exam His concerns today include:  Pt with hx of HTN, A.fib on systolic CHF with EF 30-35%, NICM, ureteral calculus, OSA (has CPAP but not using.  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     HM: [facility] not able to reach him to schedule c-scope; we confirmed his PC on the chart is correct.  He request that referral be submitted on more time  Request RF to derm to have skin tags remove  and skin exam.  Was seeing at [facility] but they no longer takes his insurance  Reports blurry vision at time. Wears readers.  He will call Dr. Octavia to schedule appt  Dizziness/NPH: reports dizziness has decreased a lot;  100% better.  No falls.  HTN/Afib/sys CHF:  out of Eliquis  x 1 mth, out of Lipitor and Metoprolol  in 3 wks Limits salt in foods No palpitations/CP/SOB/LE edema.  No bruising or bleeding on Eliquis .  Last saw cardiology 1 year ago.  Obesity: up 21 lbs since 01/2023. Reports he has been eating a lot.  Eats out 5 days a wk for lunch or dinner.  Tries to eat healthy when he eats out.  Usually does egg or sausage/egg for breakfast.  Drinks 1-2 16 oz Dr. Gretchen.  Patient Active Problem List   Diagnosis Date Noted   Calcium  oxalate calculus 07/06/2021   NICM (nonischemic cardiomyopathy) (HCC)    Atrial fibrillation with rapid ventricular response (HCC) 06/08/2021   Sepsis secondary to UTI (HCC) 06/08/2021   Severe sepsis with septic shock (HCC) 06/06/2021   Left ureteral calculus 06/06/2021   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HCC)  02/22/2021   23-polyvalent pneumococcal polysaccharide vaccine declined 02/22/2021   Status post excision of acoustic neuroma 02/07/2020   S/P right TKA 09/13/2019   S/P left TKA 08/11/2019   Genu varum of both lower extremities 06/13/2019   Vitamin B12 deficiency 06/13/2019   Obesity (BMI 30-39.9) 12/27/2018   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   Tinnitus of right ear 12/27/2018   Gait disturbance 12/27/2018   Normochromic anemia 12/27/2018   Primary osteoarthritis of both knees 12/27/2018   Diastolic heart failure (HCC) 09/11/2017   DDD (degenerative disc disease), cervical 09/04/2017   Dyspnea, chronic DOE 02/27/2012   Herniated disc    Hyperlipidemia 02/23/2009   Diastolic dysfunction, left ventricle 02/23/2009   Essential hypertension 10/18/2007   OSA on CPAP 10/11/2007     Current Outpatient Medications on File Prior to Visit  Medication Sig Dispense Refill   cyanocobalamin  1000 MCG tablet Take 3,000 mcg by mouth daily. (Patient not taking: Reported on 05/12/2023)     furosemide  (LASIX ) 20 MG tablet Take 1 tablet (20 mg total) by mouth daily as needed for edema (for swelling in legs/ankle). (Patient not taking: Reported on 09/01/2023) 30 tablet 3   metoprolol  succinate (TOPROL  XL) 25 MG 24 hr tablet Take 0.5 tablets (12.5 mg total) by mouth daily. (Patient not taking: Reported on 09/01/2023) 30 tablet 6   No current facility-administered medications on file prior to visit.    Allergies  Allergen  Reactions   Codeine Nausea And Vomiting   Oxycodone Other (See Comments)    Upset GI    Social History   Socioeconomic History   Marital status: Divorced    Spouse name: Not on file   Number of children: 0   Years of education: some college   Highest education level: Not on file  Occupational History   Occupation: Retired  Tobacco Use   Smoking status: Never    Passive exposure: Never   Smokeless tobacco: Never  Vaping Use   Vaping status: Never Used   Substance and Sexual Activity   Alcohol use: No   Drug use: No   Sexual activity: Not on file  Other Topics Concern   Not on file  Social History Narrative   Denies caffeine use    Social Drivers of Corporate Investment Banker Strain: Low Risk  (05/12/2023)   Overall Financial Resource Strain (CARDIA)    Difficulty of Paying Living Expenses: Not hard at all  Food Insecurity: No Food Insecurity (05/12/2023)   Hunger Vital Sign    Worried About Running Out of Food in the Last Year: Never true    Ran Out of Food in the Last Year: Never true  Transportation Needs: No Transportation Needs (05/12/2023)   PRAPARE - Administrator, Civil Service (Medical): No    Lack of Transportation (Non-Medical): No  Physical Activity: Insufficiently Active (05/12/2023)   Exercise Vital Sign    Days of Exercise per Week: 3 days    Minutes of Exercise per Session: 10 min  Stress: No Stress Concern Present (05/12/2023)   [employer] of Occupational Health - Occupational Stress Questionnaire    Feeling of Stress : Not at all  Social Connections: Socially Isolated (05/12/2023)   Social Connection and Isolation Panel [NHANES]    Frequency of Communication with Friends and Family: More than three times a week    Frequency of Social Gatherings with Friends and Family: Three times a week    Attends Religious Services: Never    Active Member of Clubs or Organizations: No    Attends Banker Meetings: Never    Marital Status: Divorced  Catering Manager Violence: Not At Risk (05/12/2023)   Humiliation, Afraid, Rape, and Kick questionnaire    Fear of Current or Ex-Partner: No    Emotionally Abused: No    Physically Abused: No    Sexually Abused: No    Family History  Problem Relation Age of Onset   Hypertension Mother    Heart failure Mother    Alzheimer's disease Mother    Skin cancer Mother    Heart disease Father    Alzheimer's disease Maternal Grandmother     Alzheimer's disease Maternal Grandfather     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 at [facility]  12/27/2019   accoustic neuroma   CARDIAC CATHETERIZATION  02/20/2009   normal coronary arteries   CYSTOSCOPY WITH STENT PLACEMENT Left 06/05/2021   Procedure: CYSTOSCOPY WITH STENT PLACEMENT;  Surgeon: Penne Knee, MD;  Location: [facility] ORS;  Service: Urology;  Laterality: Left;   CYSTOSCOPY/URETEROSCOPY/HOLMIUM LASER/STENT PLACEMENT Left 07/01/2021   Procedure: CYSTOSCOPY/URETEROSCOPY/HOLMIUM LASER/STENT EXCHANGE;  Surgeon: Penne Knee, MD;  Location: [facility] ORS;  Service: Urology;  Laterality: Left;   EYE SURGERY     bilateral cataract with lens implant   INCISION AND DRAINAGE PERIRECTAL ABSCESS N/A 01/02/2018   Procedure: IRRIGATION AND DEBRIDEMENT PERIRECTAL ABSCESS;  Surgeon: Vernetta,  Vicenta, MD;  Location: [facility] OR;  Service: General;  Laterality: N/A;   IRRIGATION AND DEBRIDEMENT BUTTOCKS N/A 01/05/2018   Procedure: DEBRIDEMENT BUTTOCKS ABSCESS;  Surgeon: Sebastian Moles, MD;  Location: [facility] OR;  Service: General;  Laterality: N/A;   KNEE ARTHROSCOPY  2010   Lt   LEFT HEART CATH AND CORONARY ANGIOGRAPHY N/A 06/10/2021   Procedure: LEFT HEART CATH AND CORONARY ANGIOGRAPHY;  Surgeon: Darron Deatrice LABOR, MD;  Location: [facility] INVASIVE CV LAB;  Service: Cardiovascular;  Laterality: N/A;   NM MYOCAR PERF WALL MOTION  09/28/2007   small area of reversibility in the anterolateral wall at the apex concerning for ischemia   SHOULDER ARTHROSCOPY  10/2010   Rt   TOTAL KNEE ARTHROPLASTY Left 08/11/2019   Procedure: TOTAL KNEE ARTHROPLASTY;  Surgeon: Ernie Cough, MD;  Location: [facility] ORS;  Service: Orthopedics;  Laterality: Left;  70 mins   TOTAL KNEE ARTHROPLASTY Right 09/13/2019   Procedure: TOTAL KNEE ARTHROPLASTY;  Surgeon: Ernie Cough, MD;  Location: [facility] ORS;  Service: Orthopedics;  Laterality: Right;  70 mins   WOUND DEBRIDEMENT N/A 01/04/2018    Procedure: IRRIGATION AND DEBRIDEMENT OF BUTTOCKS AND REMOVAL OF TICK FROM RIGHT TESTICLE;  Surgeon: Sebastian Moles, MD;  Location: [facility] OR;  Service: General;  Laterality: N/A;    ROS: Review of Systems Negative except as stated above  PHYSICAL EXAM: BP 118/74 (BP Location: Left Arm, Patient Position: Sitting, Cuff Size: Normal)   Pulse (!) 105   Temp 97.9 F (36.6 C) (Oral)   Ht 6' 1 (1.854 m)   Wt 276 lb (125.2 kg)   SpO2 98%   BMI 36.41 kg/m   Wt Readings from Last 3 Encounters:  09/01/23 276 lb (125.2 kg)  06/04/23 267 lb (121.1 kg)  05/12/23 255 lb (115.7 kg)    Physical Exam  General appearance - alert, well appearing, elderly Caucasian male and in no distress Mental status -patient answers questions appropriately. Eyes - pupils equal and reactive, extraocular eye movements intact Nose - normal and patent, no erythema, discharge or polyps Mouth - mucous membranes moist, pharynx normal without lesions Neck - supple, no significant adenopathy Lymphatics -no cervical or axillary lymphadenopathy Chest -breath sounds mildly decreased bilaterally.  No crackles or wheezes heard. Heart -heart sounds are irregularly irregular with mild tachycardia Extremities -no edema in the lower legs.  He has 1+ ankle edema Skin -patient has some freckling of the skin on the arms.  Some hyper and hypopigmented areas that seem consistent with actinic keratosis on the arms.  No skin tags appreciated.  He has port wine small lesion on the right breast.  Skin on plantar surface of both feet dry with cracking of the skin on the heel of the right foot. Breast: Mild to moderate bilateral gynecomastia Neuro: Patient walks with trunk flexed forward.  He is somewhat bowlegged.  Low foot to floor clearance. Ambulates without assistive device      Latest Ref Rng & Units 09/01/2023    3:25 PM 01/20/2023    9:53 PM 01/14/2023    6:53 AM  CMP  Glucose 70 - 99 mg/dL 73  78  81   BUN 8 - 27 mg/dL 19  22   18    Creatinine 0.76 - 1.27 mg/dL 8.80  8.80  9.06   Sodium 134 - 144 mmol/L 143  135  140   Potassium 3.5 - 5.2 mmol/L 4.9  3.9  3.6   Chloride 96 - 106 mmol/L 109  106  108   CO2 20 - 29 mmol/L 22  23  25    Calcium  8.6 - 10.2 mg/dL 8.9  8.5  8.3   Total Protein 6.0 - 8.5 g/dL 7.4     Total Bilirubin 0.0 - 1.2 mg/dL 0.5     Alkaline Phos 44 - 121 IU/L 76     AST 0 - 40 IU/L 15     ALT 0 - 44 IU/L 12      Lipid Panel     Component Value Date/Time   CHOL 114 09/01/2023 1525   TRIG 42 09/01/2023 1525   HDL 51 09/01/2023 1525   CHOLHDL 2.2 09/01/2023 1525   LDLCALC 52 09/01/2023 1525    CBC    Component Value Date/Time   WBC 7.2 09/01/2023 1525   WBC 7.8 01/20/2023 2153   RBC 4.77 09/01/2023 1525   RBC 4.69 01/20/2023 2153   HGB 11.1 (L) 09/01/2023 1525   HCT 36.8 (L) 09/01/2023 1525   PLT 287 09/01/2023 1525   MCV 77 (L) 09/01/2023 1525   MCH 23.3 (L) 09/01/2023 1525   MCH 26.9 01/20/2023 2153   MCHC 30.2 (L) 09/01/2023 1525   MCHC 32.1 01/20/2023 2153   RDW 16.3 (H) 09/01/2023 1525   LYMPHSABS 0.8 01/14/2023 0653   LYMPHSABS 1.2 03/24/2022 1540   MONOABS 0.4 01/14/2023 0653   EOSABS 0.1 01/14/2023 0653   EOSABS 0.0 03/24/2022 1540   BASOSABS 0.0 01/14/2023 0653   BASOSABS 0.0 03/24/2022 1540    ASSESSMENT AND PLAN:  1. Annual physical exam (Primary)   2. Permanent atrial fibrillation (HCC) Refill sent on Eliquis  and metoprolol . Will get him back in with cardiology since it has been about 1 yr since he was last seen - apixaban  (ELIQUIS ) 5 MG TABS tablet; Take 1 tablet (5 mg total) by mouth 2 (two) times daily.  Dispense: 180 tablet; Refill: 1 - CBC - Comprehensive metabolic panel  3. Chronic systolic congestive heart failure (HCC) Compensated.  Refill sent on low-dose Toprol .  Continue furosemide  as needed. - atorvastatin  (LIPITOR) 10 MG tablet; Take 1 tablet (10 mg total) by mouth daily.  Dispense: 90 tablet; Refill: 1 - Lipid panel  4. NPH (normal  pressure hydrocephalus) (HCC) Stable since he underwent revision of his VP shunt in July of last year.  Denies any dizziness.  5. Class 2 severe obesity due to excess calories with serious comorbidity and body mass index (BMI) of 36.0 to 36.9 in adult [facility]) Dietary counseling given.  He is agreeable to seeing a nutritionist.  6. Actinic keratosis - Ambulatory referral to Dermatology  7. Screening for colon cancer - Ambulatory referral to Gastroenterology  Addendum 09/03/2023: Patient with new mild microcytic anemia.  Will add iron studies.  Patient was given the opportunity to ask questions.  Patient verbalized understanding of the plan and was able to repeat key elements of the plan.   This documentation was completed using Paediatric nurse.  Any transcriptional errors are unintentional.  Orders Placed This Encounter  Procedures   CBC   Comprehensive metabolic panel   Lipid panel   Ambulatory referral to Gastroenterology   Ambulatory referral to Dermatology   Amb ref to Medical Nutrition Therapy-MNT   Ambulatory referral to Cardiology     Requested Prescriptions   Signed Prescriptions Disp Refills   atorvastatin  (LIPITOR) 10 MG tablet 90 tablet 1    Sig: Take 1 tablet (10 mg total) by mouth daily.   apixaban  (ELIQUIS ) 5 MG  TABS tablet 180 tablet 1    Sig: Take 1 tablet (5 mg total) by mouth 2 (two) times daily.    Return in about 4 months (around 12/30/2023).  Barnie Louder, MD, FACP

## 2023-09-02 ENCOUNTER — Encounter: Payer: Self-pay | Admitting: Internal Medicine

## 2023-09-02 LAB — LIPID PANEL
Chol/HDL Ratio: 2.2 {ratio} (ref 0.0–5.0)
Cholesterol, Total: 114 mg/dL (ref 100–199)
HDL: 51 mg/dL (ref >39)
LDL Chol Calc (NIH): 52 mg/dL (ref 0–99)
Triglycerides: 42 mg/dL (ref 0–149)
VLDL Cholesterol Cal: 11 mg/dL (ref 5–40)

## 2023-09-02 LAB — CBC
Hematocrit: 36.8 % — ABNORMAL LOW (ref 37.5–51.0)
Hemoglobin: 11.1 g/dL — ABNORMAL LOW (ref 13.0–17.7)
MCH: 23.3 pg — ABNORMAL LOW (ref 26.6–33.0)
MCHC: 30.2 g/dL — ABNORMAL LOW (ref 31.5–35.7)
MCV: 77 fL — ABNORMAL LOW (ref 79–97)
Platelets: 287 10*3/uL (ref 150–450)
RBC: 4.77 x10E6/uL (ref 4.14–5.80)
RDW: 16.3 % — ABNORMAL HIGH (ref 11.6–15.4)
WBC: 7.2 10*3/uL (ref 3.4–10.8)

## 2023-09-02 LAB — COMPREHENSIVE METABOLIC PANEL
ALT: 12 [IU]/L (ref 0–44)
AST: 15 [IU]/L (ref 0–40)
Albumin: 4.2 g/dL (ref 3.9–4.9)
Alkaline Phosphatase: 76 [IU]/L (ref 44–121)
BUN/Creatinine Ratio: 16 (ref 10–24)
BUN: 19 mg/dL (ref 8–27)
Bilirubin Total: 0.5 mg/dL (ref 0.0–1.2)
CO2: 22 mmol/L (ref 20–29)
Calcium: 8.9 mg/dL (ref 8.6–10.2)
Chloride: 109 mmol/L — ABNORMAL HIGH (ref 96–106)
Creatinine, Ser: 1.19 mg/dL (ref 0.76–1.27)
Globulin, Total: 3.2 g/dL (ref 1.5–4.5)
Glucose: 73 mg/dL (ref 70–99)
Potassium: 4.9 mmol/L (ref 3.5–5.2)
Sodium: 143 mmol/L (ref 134–144)
Total Protein: 7.4 g/dL (ref 6.0–8.5)
eGFR: 66 mL/min/{1.73_m2} (ref >59)

## 2023-09-03 NOTE — Addendum Note (Signed)
 Addended by: Concetta Dee B on: 09/03/2023 08:01 AM   Modules accepted: Orders

## 2023-09-09 ENCOUNTER — Telehealth: Payer: Self-pay | Admitting: Internal Medicine

## 2023-09-09 LAB — IRON AND TIBC
Iron Saturation: 6 % — CL (ref 15–55)
Iron: 21 ug/dL — ABNORMAL LOW (ref 38–169)
Total Iron Binding Capacity: 358 ug/dL (ref 250–450)
UIBC: 337 ug/dL (ref 111–343)

## 2023-09-09 LAB — SPECIMEN STATUS REPORT

## 2023-09-09 LAB — FERRITIN: Ferritin: 11 ng/mL — ABNORMAL LOW (ref 30–400)

## 2023-09-09 MED ORDER — FERROUS SULFATE 325 (65 FE) MG PO TABS
325.0000 mg | ORAL_TABLET | Freq: Every day | ORAL | 0 refills | Status: AC
Start: 2023-09-09 — End: ?

## 2023-09-09 NOTE — Telephone Encounter (Signed)
Phone call placed to patient today to go over lab results.  Patient informed that he has developed a new anemia.  Iron studies show that this is iron deficiency.  He denies seeing any blood in his stools or black stools.  Advised that this needs to be worked up further with endoscopy/colonoscopy.  I submitted a referral to [facility] gastroenterology.  They called the patient and told him he would need to sign a release for them to get colonoscopy report if he has had 1 done with [facility] or digestive health.  Patient tells me he thinks he had it done many years ago through [facility] gastroenterology and would prefer the referral be submitted to them. Advised patient to start taking iron supplement daily.  He requested that I send a prescription to [facility] and he will pick it up. I went over his other blood tests which showed kidney and liver function good and cholesterol level normal. All questions answered.  Results for orders placed or performed in visit on 09/01/23  CBC   Collection Time: 09/01/23  3:25 PM  Result Value Ref Range   WBC 7.2 3.4 - 10.8 x10E3/uL   RBC 4.77 4.14 - 5.80 x10E6/uL   Hemoglobin 11.1 (L) 13.0 - 17.7 g/dL   Hematocrit 16.1 (L) 09.6 - 51.0 %   MCV 77 (L) 79 - 97 fL   MCH 23.3 (L) 26.6 - 33.0 pg   MCHC 30.2 (L) 31.5 - 35.7 g/dL   RDW 04.5 (H) 40.9 - 81.1 %   Platelets 287 150 - 450 x10E3/uL  Comprehensive metabolic panel   Collection Time: 09/01/23  3:25 PM  Result Value Ref Range   Glucose 73 70 - 99 mg/dL   BUN 19 8 - 27 mg/dL   Creatinine, Ser 9.14 0.76 - 1.27 mg/dL   eGFR 66 >78 GN/FAO/1.30   BUN/Creatinine Ratio 16 10 - 24   Sodium 143 134 - 144 mmol/L   Potassium 4.9 3.5 - 5.2 mmol/L   Chloride 109 (H) 96 - 106 mmol/L   CO2 22 20 - 29 mmol/L   Calcium 8.9 8.6 - 10.2 mg/dL   Total Protein 7.4 6.0 - 8.5 g/dL   Albumin 4.2 3.9 - 4.9 g/dL   Globulin, Total 3.2 1.5 - 4.5 g/dL   Bilirubin Total 0.5 0.0 - 1.2 mg/dL   Alkaline Phosphatase 76 44 - 121 IU/L    AST 15 0 - 40 IU/L   ALT 12 0 - 44 IU/L  Lipid panel   Collection Time: 09/01/23  3:25 PM  Result Value Ref Range   Cholesterol, Total 114 100 - 199 mg/dL   Triglycerides 42 0 - 149 mg/dL   HDL 51 >86 mg/dL   VLDL Cholesterol Cal 11 5 - 40 mg/dL   LDL Chol Calc (NIH) 52 0 - 99 mg/dL   Chol/HDL Ratio 2.2 0.0 - 5.0 ratio  Iron and TIBC   Collection Time: 09/01/23  3:25 PM  Result Value Ref Range   Total Iron Binding Capacity 358 250 - 450 ug/dL   UIBC 578 469 - 629 ug/dL   Iron 21 (L) 38 - 528 ug/dL   Iron Saturation 6 (LL) 15 - 55 %  Ferritin   Collection Time: 09/01/23  3:25 PM  Result Value Ref Range   Ferritin 11 (L) 30 - 400 ng/mL  Specimen status report   Collection Time: 09/01/23  3:25 PM  Result Value Ref Range   specimen status report Comment

## 2023-09-22 DIAGNOSIS — L821 Other seborrheic keratosis: Secondary | ICD-10-CM | POA: Diagnosis not present

## 2023-09-22 DIAGNOSIS — L814 Other melanin hyperpigmentation: Secondary | ICD-10-CM | POA: Diagnosis not present

## 2023-09-22 DIAGNOSIS — D0339 Melanoma in situ of other parts of face: Secondary | ICD-10-CM | POA: Diagnosis not present

## 2023-09-22 DIAGNOSIS — L905 Scar conditions and fibrosis of skin: Secondary | ICD-10-CM | POA: Diagnosis not present

## 2023-09-22 DIAGNOSIS — L570 Actinic keratosis: Secondary | ICD-10-CM | POA: Diagnosis not present

## 2023-09-22 DIAGNOSIS — D225 Melanocytic nevi of trunk: Secondary | ICD-10-CM | POA: Diagnosis not present

## 2023-09-22 DIAGNOSIS — D485 Neoplasm of uncertain behavior of skin: Secondary | ICD-10-CM | POA: Diagnosis not present

## 2023-09-22 DIAGNOSIS — Z7189 Other specified counseling: Secondary | ICD-10-CM | POA: Diagnosis not present

## 2023-09-28 DIAGNOSIS — M1909 Primary osteoarthritis, other specified site: Secondary | ICD-10-CM | POA: Diagnosis not present

## 2023-09-28 DIAGNOSIS — R269 Unspecified abnormalities of gait and mobility: Secondary | ICD-10-CM | POA: Diagnosis not present

## 2023-10-20 ENCOUNTER — Telehealth: Payer: Self-pay

## 2023-10-20 DIAGNOSIS — Z8679 Personal history of other diseases of the circulatory system: Secondary | ICD-10-CM | POA: Diagnosis not present

## 2023-10-20 DIAGNOSIS — Z800 Family history of malignant neoplasm of digestive organs: Secondary | ICD-10-CM | POA: Diagnosis not present

## 2023-10-20 DIAGNOSIS — I4891 Unspecified atrial fibrillation: Secondary | ICD-10-CM | POA: Diagnosis not present

## 2023-10-20 DIAGNOSIS — Z860101 Personal history of adenomatous and serrated colon polyps: Secondary | ICD-10-CM | POA: Diagnosis not present

## 2023-10-20 NOTE — Telephone Encounter (Signed)
 I s/w the pt about Eliquis and he said he told the GI office he is taking it but has nit taken for a couple of days as he thought he needed to hold for his procedure. Advised pt to take his Eliquis tonight. I assured the pt that I will update the preop APP for any further recommendations. I informed the pt that we will call back tomorrow.

## 2023-10-20 NOTE — Telephone Encounter (Signed)
   Pre-operative Risk Assessment    Patient Name: Brandon Marks  DOB: [birth year] MRN: [id number]   Date of last office visit: 10/14/22 Wallis Bamberg, NP Date of next office visit: 11/17/23 Thurmon Fair, MD   Request for Surgical Clearance    Procedure:   COLONOSCOPY  Date of Surgery:  Clearance 10/27/23                                Surgeon:  DR Matthias Hughs Surgeon's Group or Practice Name:  [facility] Phone number:  [phone] Fax number:  [phone]   Type of Clearance Requested:   - Medical  - Pharmacy:  Hold Apixaban (Eliquis) PER CLEARANCE FORM PT TOLD SURGEON'S OFFICE HAS NOT BEEN TAKING.   Type of Anesthesia:  Not Indicated   Additional requests/questions:    Signed, Marlow Baars   10/20/2023, 4:00 PM

## 2023-10-20 NOTE — Telephone Encounter (Signed)
 They called back to ask if we got the fax and I asked how many days they want to hold it. She said 1-2 days.

## 2023-10-21 NOTE — Telephone Encounter (Signed)
VM full. Will try later.

## 2023-10-21 NOTE — Telephone Encounter (Signed)
 Patient with diagnosis of PAF on Eliquis for anticoagulation.    Procedure: coloscopy  Date of procedure: 10/27/23    CHA2DS2-VASc Score = 3   This indicates a 3.2% annual risk of stroke. [name] score is based upon: CHF History: 1 HTN History: 1 Diabetes History: 0 Stroke History: 0 Vascular Disease History: 0 Age Score: 1 Gender Score: 0     CrCl 81 mL/min Platelet count 287 K   Per office protocol, patient can hold Eliquis for 2 days prior to procedure.     **This guidance is not considered finalized until pre-operative APP has relayed final recommendations.**

## 2023-10-21 NOTE — Telephone Encounter (Signed)
 Primary Cardiologist:Mihai Croitoru, MD   Preoperative team, please contact this patient and set up a phone call appointment for further preoperative risk assessment. Please obtain consent and complete medication review. Thank you for your help.   Per office protocol, patient can hold Eliquis for 2 days prior to procedure.   I also confirmed the patient resides in the state of [place]. As per [facility] Medical Board telemedicine laws, the patient must reside in the state in which the provider is licensed.   Levi Aland, NP-C  10/21/2023, 8:03 AM [address] Office [phone] Fax [phone]

## 2023-10-23 NOTE — Telephone Encounter (Signed)
 Pt returning call to a nurse

## 2023-10-23 NOTE — Telephone Encounter (Signed)
 S/w [facility] office to let know pt needs in office visit and pt will have to R/S colonoscopy.GI office will call back. No one able to take call.

## 2023-10-23 NOTE — Telephone Encounter (Signed)
 Office called to see if patient can have telephone visit today,. If not surgery would have to get push back. Please advise

## 2023-10-23 NOTE — Telephone Encounter (Signed)
 Preoperative team, attempted to contact patient as part of preoperative protocol.  Upon chart review he has not been seen in over a year.  He will need in office visit in order to have cardiac evaluation prior to colonoscopy.  Please contact requesting office and let them know that he will not be able to proceed with colonoscopy.  He will need to reschedule.  Thank you for your help.  Thomasene Ripple. Amairany Schumpert NP-C     10/23/2023, 11:28 AM [facility] [address] Office [phone] Fax [phone]

## 2023-10-26 NOTE — Telephone Encounter (Signed)
 Pt scheduled to see Dr. Royann Shivers 11/17/23, clearance will be addressed at that time.   Will route to the requesting surgeon's office to make them aware.

## 2023-10-27 DIAGNOSIS — M1990 Unspecified osteoarthritis, unspecified site: Secondary | ICD-10-CM | POA: Diagnosis not present

## 2023-10-27 DIAGNOSIS — R269 Unspecified abnormalities of gait and mobility: Secondary | ICD-10-CM | POA: Diagnosis not present

## 2023-10-29 DIAGNOSIS — D0359 Melanoma in situ of other part of trunk: Secondary | ICD-10-CM | POA: Diagnosis not present

## 2023-10-29 DIAGNOSIS — D0339 Melanoma in situ of other parts of face: Secondary | ICD-10-CM | POA: Diagnosis not present

## 2023-11-17 ENCOUNTER — Ambulatory Visit: Attending: Cardiovascular Disease | Admitting: Cardiovascular Disease

## 2023-11-17 ENCOUNTER — Encounter: Payer: Self-pay | Admitting: Cardiovascular Disease

## 2023-11-17 VITALS — BP 138/88 | HR 94 | Ht 74.0 in | Wt 275.6 lb

## 2023-11-17 DIAGNOSIS — I4811 Longstanding persistent atrial fibrillation: Secondary | ICD-10-CM

## 2023-11-17 DIAGNOSIS — E7800 Pure hypercholesterolemia, unspecified: Secondary | ICD-10-CM | POA: Diagnosis not present

## 2023-11-17 DIAGNOSIS — D509 Iron deficiency anemia, unspecified: Secondary | ICD-10-CM | POA: Diagnosis not present

## 2023-11-17 DIAGNOSIS — I10 Essential (primary) hypertension: Secondary | ICD-10-CM | POA: Diagnosis not present

## 2023-11-17 DIAGNOSIS — I5032 Chronic diastolic (congestive) heart failure: Secondary | ICD-10-CM | POA: Diagnosis not present

## 2023-11-17 NOTE — Progress Notes (Signed)
 Patient ID: Brandon Marks, male   DOB: 06-11-1956, 68 y.o.   MRN: [id number]      Cardiology Office Note   Date:  11/17/2023   ID:  Brandon Marks, Brandon Marks [birth year], MRN [id number]  PCP:  Lawrance Presume, MD  Cardiologist:   Luana Rumple, MD   Chief Complaint  Patient presents with   Atrial Fibrillation      History of Present Illness: Brandon Marks is a 68 y.o. 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    He had normal coronary arteries by angiography in 2010 (false positive nuclear stress test in anticipation of bariatric surgery. Normal LVEF by nuclear study 2018, but low EF by technically difficult echo 2022 (30-35%) and LV gram 2022.  The studies were done in the setting of acute critical illness when he had Klebsiella back uremia and urosepsis.  He can climb a flight of stairs without shortness of breath.  Walks cautiously because of some balance issues since his schwannoma resection.  Does not have orthopnea or PND and has only mild lower extremity edema.  He is not taking diuretics.  Has not had any problems with chest pain.  He has never been aware of palpitations.  He gets a little dizziness if he stands up too quickly but has not had any falls or true syncope.  He no longer needs meds for HTN. He takes metoprolol  12.5 mg daily (which he often forgets to take and is currently out of) for AFib rate control. Heart rate today is in the 90s, with no meds recently. He takes anticoagulation with Eliquis , without serious bleeding issues. Does have a history of colon polyps, last endoscopy about 5 years ago and due for a recheck. On a low dose iron supplement chronically.  Past Medical History:  Diagnosis Date   Arthritis    Coronary artery disease     CSF leak    a. 12/2019 following resection of vestibular schwannoma; b.  12/2020 status post lumbar puncture for ventriculomegaly; e.  01/2021: Status post lumbar drain ([facility]).  Pending VP shunt.   Herniated disc    High cholesterol    History of kidney stones    History of stress test    a. 09/2007 MV: EF 63%, small area of anterolateral and apical reversibility; b.  12/2016 MV: EF 62%.  Fixed small, mild mid anteroseptal and apical defect without reversibility.  Most likely attenuation.  Low risk study.   Hypertension    Morbid obesity (HCC)    PAC (premature atrial contraction)    Persistent atrial fibrillation (HCC)    a. first noted on 12 lead ECG 10/2019.   Sleep apnea    non compliant with c-pap   Vestibular schwannoma (HCC)    a. 12/27/2019 s/p resection ([facility]). Post-op course complicated by CSF leak/seizures req repair 01/06/2020 and high volume LP on 01/16/2020.    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 at [facility]  12/27/2019   accoustic neuroma   CARDIAC CATHETERIZATION  02/20/2009   normal coronary arteries   CYSTOSCOPY WITH STENT PLACEMENT Left 06/05/2021   Procedure: CYSTOSCOPY WITH STENT PLACEMENT;  Surgeon: Dustin Gimenez, MD;  Location: [facility] ORS;  Service: Urology;  Laterality: Left;   CYSTOSCOPY/URETEROSCOPY/HOLMIUM LASER/STENT PLACEMENT Left  07/01/2021   Procedure: CYSTOSCOPY/URETEROSCOPY/HOLMIUM LASER/STENT EXCHANGE;  Surgeon: Dustin Gimenez, MD;  Location: [facility] ORS;  Service: Urology;  Laterality: Left;   EYE SURGERY     bilateral cataract with lens implant   INCISION AND DRAINAGE PERIRECTAL ABSCESS N/A 01/02/2018   Procedure: IRRIGATION AND DEBRIDEMENT PERIRECTAL ABSCESS;  Surgeon: Oza Blumenthal, MD;  Location: [facility] OR;  Service: General;  Laterality: N/A;   IRRIGATION AND DEBRIDEMENT BUTTOCKS N/A 01/05/2018   Procedure: DEBRIDEMENT BUTTOCKS ABSCESS;  Surgeon: Dorena Gander, MD;  Location: [facility] OR;  Service: General;   Laterality: N/A;   KNEE ARTHROSCOPY  2010   Lt   LEFT HEART CATH AND CORONARY ANGIOGRAPHY N/A 06/10/2021   Procedure: LEFT HEART CATH AND CORONARY ANGIOGRAPHY;  Surgeon: Wenona Hamilton, MD;  Location: [facility] INVASIVE CV LAB;  Service: Cardiovascular;  Laterality: N/A;   NM MYOCAR PERF WALL MOTION  09/28/2007   small area of reversibility in the anterolateral wall at the apex concerning for ischemia   SHOULDER ARTHROSCOPY  10/2010   Rt   TOTAL KNEE ARTHROPLASTY Left 08/11/2019   Procedure: TOTAL KNEE ARTHROPLASTY;  Surgeon: Claiborne Crew, MD;  Location: [facility] ORS;  Service: Orthopedics;  Laterality: Left;  70 mins   TOTAL KNEE ARTHROPLASTY Right 09/13/2019   Procedure: TOTAL KNEE ARTHROPLASTY;  Surgeon: Claiborne Crew, MD;  Location: [facility] ORS;  Service: Orthopedics;  Laterality: Right;  70 mins   WOUND DEBRIDEMENT N/A 01/04/2018   Procedure: IRRIGATION AND DEBRIDEMENT OF BUTTOCKS AND REMOVAL OF TICK FROM RIGHT TESTICLE;  Surgeon: Dorena Gander, MD;  Location: [facility] OR;  Service: General;  Laterality: N/A;     Current Outpatient Medications  Medication Sig Dispense Refill   apixaban  (ELIQUIS ) 5 MG TABS tablet Take 1 tablet (5 mg total) by mouth 2 (two) times daily. 180 tablet 1   ferrous sulfate  325 (65 FE) MG tablet Take 1 tablet (325 mg total) by mouth daily with breakfast. 100 tablet 0   atorvastatin  (LIPITOR) 10 MG tablet Take 1 tablet (10 mg total) by mouth daily. (Patient not taking: Reported on 11/17/2023) 90 tablet 1   cyanocobalamin  1000 MCG tablet Take 3,000 mcg by mouth daily. (Patient not taking: Reported on 11/17/2023)     furosemide  (LASIX ) 20 MG tablet Take 1 tablet (20 mg total) by mouth daily as needed for edema (for swelling in legs/ankle). (Patient not taking: Reported on 11/17/2023) 30 tablet 3   metoprolol  succinate (TOPROL  XL) 25 MG 24 hr tablet Take 0.5 tablets (12.5 mg total) by mouth daily. (Patient not taking: Reported on 11/17/2023) 30 tablet 6   No current  facility-administered medications for this visit.    Allergies:   Codeine and Oxycodone    Social History:  The patient  reports that he has never smoked. He has never been exposed to tobacco smoke. He has never used smokeless tobacco. He reports that he does not drink alcohol and does not use drugs.   Family History:  [name] family history includes Alzheimer's disease in his maternal grandfather, maternal grandmother, and mother; Heart disease in his father; Heart failure in his mother; Hypertension in his mother; Skin cancer in his mother.    ROS:  Please see the history of present illness.    Otherwise, review of systems positive for none.   All other systems are reviewed and negative.    PHYSICAL EXAM: VS:  BP 138/88 (BP Location: Left Arm, Patient Position: Sitting, Cuff Size: Large)   Pulse 94   Ht 6\' 2"  (1.88  m)   Wt 275 lb 9.6 oz (125 kg)   SpO2 99%   BMI 35.38 kg/m  , BMI Body mass index is 35.38 kg/m.   General: Alert, oriented x3, no distress, moderately obese Head: no evidence of trauma, PERRL, EOMI, no exophtalmos or lid lag, no myxedema, no xanthelasma; normal ears, nose and oropharynx Neck: normal jugular venous pulsations and no hepatojugular reflux; brisk carotid pulses without delay and no carotid bruits Chest: clear to auscultation, no signs of consolidation by percussion or palpation, normal fremitus, symmetrical and full respiratory excursions Cardiovascular: normal position and quality of the apical impulse, irregular rhythm, normal first and second heart sounds, no murmurs, rubs or gallops Abdomen: no tenderness or distention, no masses by palpation, no abnormal pulsatility or arterial bruits, normal bowel sounds, no hepatosplenomegaly Extremities: 1+ pedal and ankle edema symmetrically Neurological: grossly nonfocal Psych: Normal mood and affect    EKG:  EKG is ordered today.  EKG Interpretation Date/Time:  Tuesday November 17 2023 10:16:46  EDT Ventricular Rate:  94 PR Interval:    QRS Duration:  102 QT Interval:  362 QTC Calculation: 452 R Axis:   48  Text Interpretation: Atrial fibrillation When compared with ECG of 20-Jan-2023 21:59, No significant change was found Confirmed by Odie Edmonds [phone]) on 11/17/2023 10:27:34 AM           Recent Labs: 09/01/2023: ALT 12; BUN 19; Creatinine, Ser 1.19; Hemoglobin 11.1; Platelets 287; Potassium 4.9; Sodium 143    Lipid Panel    Component Value Date/Time   CHOL 114 09/01/2023 1525   TRIG 42 09/01/2023 1525   HDL 51 09/01/2023 1525   CHOLHDL 2.2 09/01/2023 1525   LDLCALC 52 09/01/2023 1525      Wt Readings from Last 3 Encounters:  11/17/23 275 lb 9.6 oz (125 kg)  09/01/23 276 lb (125.2 kg)  06/04/23 267 lb (121.1 kg)      ASSESSMENT AND PLAN:  1.   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 He does not have ischemic heart disease.   NYHA class I. Mostly has findings of right heart failure on exam.  Has only mild ankle edema and he is not currently taking any diuretics.  He prefers to take a few medicines as possible and is generally skeptical of medications.  We will need to update his echocardiogram.  I am not sure that the data obtained during his critical illness in 2022 was truly reflective of his chronic condition.  2. AFib: It appears that he has now been in persistent atrial fibrillation for at least 3 years.  He has severe left atrial dilation based on the actual measurements from his echocardiogram (end-systolic volume index 48 ml/m sq), even though the report states that the left atrium is mildly dilated.  It is unlikely we will be able to return to normal rhythm.  Has adequate rate control now without any medications.  He has not taken metoprolol  in quite a while even though it is on his medication list.  Reinforced the use of being compliant with Eliquis  twice daily.  CHA2DS2-VASc score 3 (age,  HTN, CHF) but he has never had TIA or stroke.  3.  HTN: Now controlled without medications after weight loss.  4.  Hypercholesterolemia: All lipid parameters in target range on current atorvastatin  prescription.  Continue.  5. Obesity: Lost over 125 pounds after gastric bypass but has already gained weight back.  He is trying to  focus on better control of his diet to avoid additional weight gain.  He is not using CPAP consistently but he denies daytime hypersomnolence.  He does not have diabetes mellitus.  6.Anemia: Borderline anemic with a recent hemoglobin of 11.1, on chronic iron supplementation.  Labs from February did confirm iron deficiency.  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  It is important to go ahead with his planned colonoscopy.  Low risk to interrupt his anticoagulant for 2-3 days before his colonoscopy    Orders Placed This Encounter  Procedures   EKG 12-Lead    Patient Instructions  Medication Instructions:  Continue same medications *If you need a refill on your cardiac medications before your next appointment, please call your pharmacy*  Lab Work: None ordered  Testing/Procedures: None ordered  Follow-Up: At [facility], you and your health needs are our priority.  As part of our continuing mission to provide you with exceptional heart care, our providers are all part of one team.  This team includes your primary Cardiologist (physician) and Advanced Practice Providers or APPs (Physician Assistants and Nurse Practitioners) who all work together to provide you with the care you need, when you need it.  Your next appointment:  1 year    Call in Jan to schedule April appointment     Provider:  Dr.Jaqualin Serpa   We recommend signing up for the patient portal called "MyChart".  Sign up  information is provided on this After Visit Summary.  MyChart is used to connect with patients for Virtual Visits (Telemedicine).  Patients are able to view lab/test results, encounter notes, upcoming appointments, etc.  Non-urgent messages can be sent to your provider as well.   To learn more about what you can do with MyChart, go to [email or web address].        1st Floor: - Lobby - Registration  - Pharmacy  - Lab - Cafe  2nd Floor: - PV Lab - Diagnostic Testing (echo, CT, nuclear med)  3rd Floor: - Vacant  4th Floor: - TCTS (cardiothoracic surgery) - AFib Clinic - Structural Heart Clinic - Vascular Surgery  - Vascular Ultrasound  5th Floor: - HeartCare Cardiology (general and EP) - Clinical Pharmacy for coumadin, hypertension, lipid, weight-loss medications, and med management appointments    Valet parking services will be available as well.     Signed, Luana Rumple, MD  11/17/2023 11:29 AM    Luana Rumple, MD, [facility] [phone] office [phone] pager

## 2023-11-17 NOTE — Patient Instructions (Signed)
 Medication Instructions:  Continue same medications *If you need a refill on your cardiac medications before your next appointment, please call your pharmacy*  Lab Work: None ordered  Testing/Procedures: None ordered  Follow-Up: At [facility], you and your health needs are our priority.  As part of our continuing mission to provide you with exceptional heart care, our providers are all part of one team.  This team includes your primary Cardiologist (physician) and Advanced Practice Providers or APPs (Physician Assistants and Nurse Practitioners) who all work together to provide you with the care you need, when you need it.  Your next appointment:  1 year    Call in Jan to schedule April appointment     Provider:  Dr.Croitoru   We recommend signing up for the patient portal called "MyChart".  Sign up information is provided on this After Visit Summary.  MyChart is used to connect with patients for Virtual Visits (Telemedicine).  Patients are able to view lab/test results, encounter notes, upcoming appointments, etc.  Non-urgent messages can be sent to your provider as well.   To learn more about what you can do with MyChart, go to [email or web address].        1st Floor: - Lobby - Registration  - Pharmacy  - Lab - Cafe  2nd Floor: - PV Lab - Diagnostic Testing (echo, CT, nuclear med)  3rd Floor: - Vacant  4th Floor: - TCTS (cardiothoracic surgery) - AFib Clinic - Structural Heart Clinic - Vascular Surgery  - Vascular Ultrasound  5th Floor: - HeartCare Cardiology (general and EP) - Clinical Pharmacy for coumadin, hypertension, lipid, weight-loss medications, and med management appointments    Valet parking services will be available as well.

## 2023-11-20 ENCOUNTER — Telehealth: Payer: Self-pay

## 2023-11-20 NOTE — Telephone Encounter (Signed)
 Received a call from [facility] GI requesting Dr.Croitoru's 11/17/23 office note.4/22 office note faxed to # [phone].

## 2023-12-10 DIAGNOSIS — Z09 Encounter for follow-up examination after completed treatment for conditions other than malignant neoplasm: Secondary | ICD-10-CM | POA: Diagnosis not present

## 2023-12-10 DIAGNOSIS — Z860101 Personal history of adenomatous and serrated colon polyps: Secondary | ICD-10-CM | POA: Diagnosis not present

## 2023-12-10 DIAGNOSIS — Z800 Family history of malignant neoplasm of digestive organs: Secondary | ICD-10-CM | POA: Diagnosis not present

## 2023-12-10 DIAGNOSIS — K5730 Diverticulosis of large intestine without perforation or abscess without bleeding: Secondary | ICD-10-CM | POA: Diagnosis not present

## 2023-12-11 ENCOUNTER — Other Ambulatory Visit: Payer: Self-pay | Admitting: Nurse Practitioner

## 2023-12-11 DIAGNOSIS — Z860101 Personal history of adenomatous and serrated colon polyps: Secondary | ICD-10-CM

## 2024-01-08 ENCOUNTER — Other Ambulatory Visit

## 2024-02-01 ENCOUNTER — Inpatient Hospital Stay: Admission: RE | Admit: 2024-02-01 | Source: Ambulatory Visit

## 2024-02-12 ENCOUNTER — Ambulatory Visit
Admission: RE | Admit: 2024-02-12 | Discharge: 2024-02-12 | Disposition: A | Discharge status: Home / Self Care | Source: Ambulatory Visit | Attending: Nurse Practitioner | Admitting: Nurse Practitioner

## 2024-02-12 DIAGNOSIS — Z860101 Personal history of adenomatous and serrated colon polyps: Secondary | ICD-10-CM

## 2024-02-23 NOTE — Procedures (Signed)
Result scanned to media

## 2024-03-01 ENCOUNTER — Other Ambulatory Visit: Payer: Self-pay | Admitting: Cardiology

## 2024-03-01 DIAGNOSIS — I10 Essential (primary) hypertension: Secondary | ICD-10-CM

## 2024-03-02 ENCOUNTER — Ambulatory Visit
Admission: RE | Admit: 2024-03-02 | Discharge: 2024-03-02 | Disposition: A | Discharge status: Home / Self Care | Source: Ambulatory Visit | Attending: Nurse Practitioner | Admitting: Nurse Practitioner

## 2024-03-02 DIAGNOSIS — D3502 Benign neoplasm of left adrenal gland: Secondary | ICD-10-CM | POA: Diagnosis not present

## 2024-03-04 ENCOUNTER — Other Ambulatory Visit: Payer: Self-pay | Admitting: Internal Medicine

## 2024-03-04 DIAGNOSIS — I5022 Chronic systolic (congestive) heart failure: Secondary | ICD-10-CM

## 2024-05-17 ENCOUNTER — Ambulatory Visit: Payer: Medicare HMO | Attending: Internal Medicine

## 2024-08-15 ENCOUNTER — Ambulatory Visit
Admission: EM | Admit: 2024-08-15 | Discharge: 2024-08-15 | Disposition: A | Discharge status: Home / Self Care | Source: Home / Self Care

## 2024-08-15 ENCOUNTER — Encounter: Payer: Self-pay | Admitting: Emergency Medicine

## 2024-08-15 ENCOUNTER — Ambulatory Visit

## 2024-08-15 DIAGNOSIS — S91309A Unspecified open wound, unspecified foot, initial encounter: Secondary | ICD-10-CM | POA: Diagnosis not present

## 2024-08-15 DIAGNOSIS — S91302A Unspecified open wound, left foot, initial encounter: Secondary | ICD-10-CM | POA: Diagnosis not present

## 2024-08-15 MED ORDER — AMMONIUM LACTATE 12 % EX LOTN: 1.0000 | TOPICAL_LOTION | CUTANEOUS | 0 refills | Status: AC | PRN

## 2024-08-15 NOTE — ED Provider Notes (Signed)
 " [facility]    CSN: [id number] Arrival date & time: 08/15/24  1503      History   Chief Complaint Chief Complaint  Patient presents with   Foot Injury    L foot    Wound Check    HPI Brandon Marks is a 69 y.o. male.   Pt presents today because he is concerned that he has a foreign body that has punctured his left heel. Pt states that he believe he stepped on something 3-4 days ago, and it causes sharp pain with every step. Pt cannot remember last tetanus immunization and declines any immunizations today.   The history is provided by the patient.  Foot Injury Wound Check    Past Medical History:  Diagnosis Date   Arthritis    Coronary artery disease    CSF leak    a. 12/2019 following resection of vestibular schwannoma; b.  12/2020 status post lumbar puncture for ventriculomegaly; e.  01/2021: Status post lumbar drain ([facility]).  Pending VP shunt.   Herniated disc    High cholesterol    History of kidney stones    History of stress test    a. 09/2007 MV: EF 63%, small area of anterolateral and apical reversibility; b.  12/2016 MV: EF 62%.  Fixed small, mild mid anteroseptal and apical defect without reversibility.  Most likely attenuation.  Low risk study.   Hypertension    Morbid obesity (HCC)    PAC (premature atrial contraction)    Persistent atrial fibrillation (HCC)    a. first noted on 12 lead ECG 10/2019.   Sleep apnea    non compliant with c-pap   Vestibular schwannoma (HCC)    a. 12/27/2019 s/p resection ([facility]). Post-op course complicated by CSF leak/seizures req repair 01/06/2020 and high volume LP on 01/16/2020.    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   Preoperative evaluation to rule out surgical contraindication 08/16/2021   Calcium  oxalate calculus 07/06/2021   NICM (nonischemic cardiomyopathy) [facility])    Atrial fibrillation with rapid ventricular response (HCC) 06/08/2021    Sepsis secondary to UTI (HCC) 06/08/2021   Severe sepsis with septic shock (HCC) 06/06/2021   Left ureteral calculus 06/06/2021   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HCC) 02/22/2021   23-polyvalent pneumococcal polysaccharide vaccine declined 02/22/2021   Orthostatic dizziness 01/15/2021   Pain in joint of left shoulder 08/01/2020   Status post excision of acoustic neuroma 02/07/2020   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   Asymmetrical hearing loss 11/17/2019   Right-sided vestibular weakness 11/17/2019   S/P right TKA 09/13/2019   S/P left TKA 08/11/2019   Genu varum of both lower extremities 06/13/2019   Vitamin B12 deficiency 06/13/2019   Obesity (BMI 30-39.9) 12/27/2018   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   Tinnitus of right ear 12/27/2018   Gait disturbance 12/27/2018   Normochromic anemia 12/27/2018   Primary osteoarthritis of both knees 12/27/2018   Diastolic heart failure (HCC) 09/11/2017   DDD (degenerative disc disease), cervical 09/04/2017   Dyspnea, chronic DOE 02/27/2012   Herniated disc    Hyperlipidemia 02/23/2009   Diastolic dysfunction, left ventricle 02/23/2009   Essential hypertension 10/18/2007   OSA on CPAP 10/11/2007    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 at [facility]  12/27/2019   accoustic neuroma   CARDIAC CATHETERIZATION  02/20/2009   normal coronary arteries  CYSTOSCOPY WITH STENT PLACEMENT Left 06/05/2021   Procedure: CYSTOSCOPY WITH STENT PLACEMENT;  Surgeon: Penne Knee, MD;  Location: [facility] ORS;  Service: Urology;  Laterality: Left;   CYSTOSCOPY/URETEROSCOPY/HOLMIUM LASER/STENT PLACEMENT Left 07/01/2021   Procedure: CYSTOSCOPY/URETEROSCOPY/HOLMIUM LASER/STENT EXCHANGE;  Surgeon: Penne Knee, MD;  Location: [facility] OR[facility];  Service: Urology;  Laterality: Left;   EYE SURGERY     bilateral cataract with lens implant   INCISION AND DRAINAGE PERIRECTAL ABSCESS N/A 01/02/2018   Procedure:  IRRIGATION AND DEBRIDEMENT PERIRECTAL ABSCESS;  Surgeon: Vernetta Berg, MD;  Location: [facility] OR;  Service: General;  Laterality: N/A;   IRRIGATION AND DEBRIDEMENT BUTTOCKS N/A 01/05/2018   Procedure: DEBRIDEMENT BUTTOCKS ABSCESS;  Surgeon: Sebastian Moles, MD;  Location: [facility] OR;  Service: General;  Laterality: N/A;   KNEE ARTHROSCOPY  2010   Lt   LEFT HEART CATH AND CORONARY ANGIOGRAPHY N/A 06/10/2021   Procedure: LEFT HEART CATH AND CORONARY ANGIOGRAPHY;  Surgeon: Darron Deatrice LABOR, MD;  Location: [facility];  Service: Cardiovascular;  Laterality: N/A;   NM MYOCAR PERF WALL MOTION  09/28/2007   small area of reversibility in the anterolateral wall at the apex concerning for ischemia   SHOULDER ARTHROSCOPY  10/2010   Rt   TOTAL KNEE ARTHROPLASTY Left 08/11/2019   Procedure: TOTAL KNEE ARTHROPLASTY;  Surgeon: Ernie Cough, MD;  Location: [facility] ORS;  Service: Orthopedics;  Laterality: Left;  70 mins   TOTAL KNEE ARTHROPLASTY Right 09/13/2019   Procedure: TOTAL KNEE ARTHROPLASTY;  Surgeon: Ernie Cough, MD;  Location: [facility] OR[facility];  Service: Orthopedics;  Laterality: Right;  70 mins   WOUND DEBRIDEMENT N/A 01/04/2018   Procedure: IRRIGATION AND DEBRIDEMENT OF BUTTOCKS AND REMOVAL OF TICK FROM RIGHT TESTICLE;  Surgeon: Sebastian Moles, MD;  Location: [facility] OR;  Service: General;  Laterality: N/A;       Home Medications    Prior to Admission medications  Medication Sig Start Date End Date Taking? Authorizing Provider  ammonium lactate  (LAC-HYDRIN ) 12 % lotion Apply 1 Application topically as needed for dry skin. 08/15/24  Yes Andra Krabbe C, PA-C  apixaban  (ELIQUIS ) 5 MG TABS tablet Take 1 tablet (5 mg total) by mouth 2 (two) times daily. 09/01/23  Yes Vicci Barnie NOVAK, MD  atorvastatin  (LIPITOR) 10 MG tablet TAKE 1 TABLET (10 MG TOTAL) BY MOUTH DAILY. 03/04/24   Vicci Barnie NOVAK, MD  cyanocobalamin  1000 MCG tablet Take 3,000 mcg by mouth daily. Patient not taking: Reported on  11/17/2023    [provider]  ferrous sulfate  325 (65 FE) MG tablet Take 1 tablet (325 mg total) by mouth daily with breakfast. 09/09/23   Vicci Barnie NOVAK, MD  furosemide  (LASIX ) 20 MG tablet Take 1 tablet (20 mg total) by mouth daily as needed for edema (for swelling in legs/ankle). Patient not taking: Reported on 11/17/2023 02/23/23   Vicci Barnie NOVAK, MD  metoprolol  succinate (TOPROL  XL) 25 MG 24 hr tablet Take 0.5 tablets (12.5 mg total) by mouth daily. Patient not taking: Reported on 11/17/2023 02/23/23   Vicci Barnie NOVAK, MD    Family History Family History  Problem Relation Age of Onset   Hypertension Mother    Heart failure Mother    Alzheimer's disease Mother    Skin cancer Mother    Heart disease Father    Alzheimer's disease Maternal Grandmother    Alzheimer's disease Maternal Grandfather     Social History Social History[1]   Allergies   Codeine and Oxycodone   Review of Systems Review  of Systems   Physical Exam Triage Vital Signs ED Triage Vitals  Encounter Vitals Group     BP 08/15/24 1557 135/87     Girls Systolic BP Percentile --      Girls Diastolic BP Percentile --      Boys Systolic BP Percentile --      Boys Diastolic BP Percentile --      Pulse Rate 08/15/24 1557 61     Resp 08/15/24 1557 20     Temp 08/15/24 1557 (!) 97.5 F (36.4 C)     Temp Source 08/15/24 1557 Oral     SpO2 08/15/24 1557 97 %     Weight 08/15/24 1607 275 lb 9.2 oz (125 kg)     Height --      Head Circumference --      Peak Flow --      Pain Score 08/15/24 1607 2     Pain Loc --      Pain Education --      Exclude from Growth Chart --    No data found.  Updated Vital Signs BP 135/87 (BP Location: Left Arm)   Pulse 61   Temp (!) 97.5 F (36.4 C) (Oral)   Resp 20   Wt 275 lb 9.2 oz (125 kg)   SpO2 97%   BMI 35.38 kg/m   Visual Acuity Right Eye Distance:   Left Eye Distance:   Bilateral Distance:    Right Eye Near:   Left Eye Near:     Bilateral Near:     Physical Exam Vitals and nursing note reviewed.  Constitutional:      General: He is not in acute distress.    Appearance: Normal appearance. He is not ill-appearing, toxic-appearing or diaphoretic.  Eyes:     General: No scleral icterus. Cardiovascular:     Rate and Rhythm: Normal rate and regular rhythm.     Heart sounds: Normal heart sounds.  Pulmonary:     Effort: Pulmonary effort is normal. No respiratory distress.     Breath sounds: Normal breath sounds. No wheezing or rhonchi.  Musculoskeletal:       Feet:     Comments: Xerotic skin noted of feet and fissure noted of left heel  Skin:    General: Skin is warm.  Neurological:     Mental Status: He is alert and oriented to person, place, and time.  Psychiatric:        Mood and Affect: Mood normal.        Behavior: Behavior normal.      UC Treatments / Results  Labs (all labs ordered are listed, but only abnormal results are displayed) Labs Reviewed - No data to display  EKG   Radiology DG Foot Complete Left Result Date: 08/15/2024 EXAM: 3 VIEW(S) XRAY OF THE LEFT FOOT 08/15/2024 04:53:49 PM COMPARISON: None available. CLINICAL HISTORY: Injury Injury Injury Injury Injury FINDINGS: BONES AND JOINTS: No acute fracture. No malalignment. Osteoarthritis of first metatarsophalangeal joint. Osteoarthritis of midfoot and hindfoot. SOFT TISSUES: Dorsal soft tissue swelling. No radiopaque foreign body identified. IMPRESSION: 1. No acute fracture or dislocation. 2. Soft tissue swelling without radiopaque foreign body. Electronically signed by: Greig Pique MD 08/15/2024 05:24 PM EST RP Workstation: [id number]    Procedures Procedures (including critical care time)  Medications Ordered in UC Medications - No data to display  Initial Impression / Assessment and Plan / UC Course  I have reviewed the triage vital signs and the nursing notes.  Pertinent labs & imaging results that were available during my  care of the patient were reviewed by me and considered in my medical decision making (see chart for details).     Final Clinical Impressions(s) / UC Diagnoses   Final diagnoses:  Open wound of heel, initial encounter     Discharge Instructions      No foreign body noted in your foot.  You skin is dry and cracked, will prescribe a lotion to use on your feet to help heal your foot.    ED Prescriptions     Medication Sig Dispense Auth. Provider   ammonium lactate  (LAC-HYDRIN ) 12 % lotion Apply 1 Application topically as needed for dry skin. 400 g Andra Corean BROCKS, PA-C      PDMP not reviewed this encounter.    [1]  Social History Tobacco Use   Smoking status: Never    Passive exposure: Never   Smokeless tobacco: Never  Vaping Use   Vaping status: Never Used  Substance Use Topics   Alcohol use: No   Drug use: No     Andra Corean BROCKS, PA-C 08/15/24 1743  "

## 2024-08-15 NOTE — ED Triage Notes (Signed)
 Pt presents c/o L foot injury and wound check x a couple days. Pt states,  I stepped on something and I can't get it out.  Pt reports he isn't sure what he stepped on but the injury is causing a lot of pain.

## 2024-08-15 NOTE — Discharge Instructions (Addendum)
 No foreign body noted in your foot.  Your skin is dry and cracked, will prescribe a lotion to use on your feet to help heal your foot.
